# Patient Record
Sex: Male | Born: 1965 | Race: White | Hispanic: No | Marital: Married
Health system: Southern US, Community
[De-identification: ages and names within clinical notes are randomized; demographics above are authoritative.]

## PROBLEM LIST (undated history)

## (undated) DIAGNOSIS — E785 Hyperlipidemia, unspecified: Secondary | ICD-10-CM

## (undated) DIAGNOSIS — N183 Chronic kidney disease, stage 3 unspecified: Secondary | ICD-10-CM

## (undated) DIAGNOSIS — I714 Abdominal aortic aneurysm, without rupture: Secondary | ICD-10-CM

## (undated) DIAGNOSIS — E119 Type 2 diabetes mellitus without complications: Secondary | ICD-10-CM

## (undated) DIAGNOSIS — E669 Obesity, unspecified: Secondary | ICD-10-CM

## (undated) DIAGNOSIS — I10 Essential (primary) hypertension: Secondary | ICD-10-CM

## (undated) HISTORY — DX: Chronic kidney disease, stage 3 (moderate): N18.3

## (undated) HISTORY — DX: Chronic kidney disease, stage 3 unspecified: N18.30

## (undated) HISTORY — DX: Essential (primary) hypertension: I10

## (undated) HISTORY — DX: Obesity, unspecified: E66.9

## (undated) HISTORY — DX: Abdominal aortic aneurysm, without rupture: I71.4

## (undated) HISTORY — DX: Hyperlipidemia, unspecified: E78.5

## (undated) HISTORY — PX: OTHER SURGICAL HISTORY: SHX169

## (undated) HISTORY — DX: Type 2 diabetes mellitus without complications: E11.9

---

## 2005-04-06 ENCOUNTER — Ambulatory Visit: Payer: Self-pay | Admitting: Oncology

## 2005-06-24 ENCOUNTER — Ambulatory Visit: Payer: Self-pay | Admitting: Oncology

## 2005-06-25 LAB — COMPREHENSIVE METABOLIC PANEL
ALT: 65 U/L — ABNORMAL HIGH (ref 0–40)
AST: 54 U/L — ABNORMAL HIGH (ref 0–37)
Albumin: 4.6 g/dL (ref 3.5–5.2)
Calcium: 9.1 mg/dL (ref 8.4–10.5)
Chloride: 101 mEq/L (ref 96–112)
Potassium: 4.5 mEq/L (ref 3.5–5.3)
Sodium: 135 mEq/L (ref 135–145)

## 2005-06-25 LAB — CBC WITH DIFFERENTIAL/PLATELET
BASO%: 0.2 % (ref 0.0–2.0)
EOS%: 4 % (ref 0.0–7.0)
HGB: 17.5 g/dL — ABNORMAL HIGH (ref 13.0–17.1)
MCH: 31.9 pg (ref 28.0–33.4)
MCHC: 35.1 g/dL (ref 32.0–35.9)
RDW: 13.5 % (ref 11.2–14.6)
lymph#: 1.1 10*3/uL (ref 0.9–3.3)

## 2005-10-05 ENCOUNTER — Ambulatory Visit: Payer: Self-pay | Admitting: Oncology

## 2005-10-08 LAB — CBC WITH DIFFERENTIAL/PLATELET
BASO%: 0.6 % (ref 0.0–2.0)
Basophils Absolute: 0 10*3/uL (ref 0.0–0.1)
HCT: 40.4 % (ref 38.7–49.9)
HGB: 14.3 g/dL (ref 13.0–17.1)
MONO#: 0.5 10*3/uL (ref 0.1–0.9)
NEUT%: 55.4 % (ref 40.0–75.0)
RDW: 12.9 % (ref 11.2–14.6)
WBC: 5.8 10*3/uL (ref 4.0–10.0)
lymph#: 2 10*3/uL (ref 0.9–3.3)

## 2005-10-11 LAB — COMPREHENSIVE METABOLIC PANEL
ALT: 58 U/L — ABNORMAL HIGH (ref 0–40)
BUN: 11 mg/dL (ref 6–23)
CO2: 24 mEq/L (ref 19–32)
Chloride: 99 mEq/L (ref 96–112)
Creatinine, Ser: 1.04 mg/dL (ref 0.40–1.50)
Potassium: 4 mEq/L (ref 3.5–5.3)

## 2005-10-11 LAB — ERYTHROPOIETIN: Erythropoietin: 6.4 m[IU]/mL (ref 2.6–34.0)

## 2005-10-11 LAB — IRON AND TIBC
%SAT: 38 % (ref 20–55)
Iron: 100 ug/dL (ref 42–165)
UIBC: 166 ug/dL

## 2006-05-06 ENCOUNTER — Ambulatory Visit: Payer: Self-pay | Admitting: Oncology

## 2006-05-20 LAB — CBC WITH DIFFERENTIAL/PLATELET
BASO%: 0.3 % (ref 0.0–2.0)
EOS%: 1.4 % (ref 0.0–7.0)
HCT: 42 % (ref 38.7–49.9)
MCH: 32 pg (ref 28.0–33.4)
MCHC: 35.5 g/dL (ref 32.0–35.9)
NEUT%: 58.2 % (ref 40.0–75.0)
RDW: 12.7 % (ref 11.2–14.6)
lymph#: 2.1 10*3/uL (ref 0.9–3.3)

## 2006-05-20 LAB — IRON AND TIBC: TIBC: 284 ug/dL (ref 215–435)

## 2006-09-01 ENCOUNTER — Ambulatory Visit: Payer: Self-pay | Admitting: Vascular Surgery

## 2006-09-09 ENCOUNTER — Encounter: Admission: RE | Admit: 2006-09-09 | Discharge: 2006-09-09 | Payer: Self-pay | Admitting: Vascular Surgery

## 2006-09-09 ENCOUNTER — Ambulatory Visit: Payer: Self-pay | Admitting: Vascular Surgery

## 2006-09-16 ENCOUNTER — Ambulatory Visit: Payer: Self-pay | Admitting: Vascular Surgery

## 2006-09-26 ENCOUNTER — Ambulatory Visit: Payer: Self-pay | Admitting: Vascular Surgery

## 2006-09-26 ENCOUNTER — Encounter: Payer: Self-pay | Admitting: Vascular Surgery

## 2006-09-26 ENCOUNTER — Inpatient Hospital Stay (HOSPITAL_COMMUNITY): Admission: RE | Admit: 2006-09-26 | Discharge: 2006-10-17 | Payer: Self-pay | Admitting: Vascular Surgery

## 2006-09-26 ENCOUNTER — Ambulatory Visit: Payer: Self-pay | Admitting: Critical Care Medicine

## 2006-09-27 ENCOUNTER — Encounter: Payer: Self-pay | Admitting: Internal Medicine

## 2006-09-30 ENCOUNTER — Ambulatory Visit: Payer: Self-pay | Admitting: Internal Medicine

## 2006-11-02 ENCOUNTER — Ambulatory Visit: Payer: Self-pay | Admitting: Vascular Surgery

## 2006-11-23 ENCOUNTER — Ambulatory Visit: Payer: Self-pay | Admitting: Oncology

## 2007-05-10 ENCOUNTER — Ambulatory Visit: Payer: Self-pay | Admitting: Vascular Surgery

## 2007-07-05 ENCOUNTER — Encounter: Admission: RE | Admit: 2007-07-05 | Discharge: 2007-07-05 | Payer: Self-pay | Admitting: Sports Medicine

## 2009-03-07 ENCOUNTER — Emergency Department (HOSPITAL_COMMUNITY): Admission: EM | Admit: 2009-03-07 | Discharge: 2009-03-07 | Payer: Self-pay | Admitting: Emergency Medicine

## 2010-02-27 ENCOUNTER — Encounter
Admission: RE | Admit: 2010-02-27 | Discharge: 2010-02-27 | Payer: Self-pay | Source: Home / Self Care | Attending: Orthopedic Surgery | Admitting: Orthopedic Surgery

## 2010-03-02 ENCOUNTER — Encounter
Admission: RE | Admit: 2010-03-02 | Discharge: 2010-03-02 | Payer: Self-pay | Source: Home / Self Care | Attending: Orthopedic Surgery | Admitting: Orthopedic Surgery

## 2010-04-12 ENCOUNTER — Encounter: Payer: Self-pay | Admitting: Orthopedic Surgery

## 2010-06-17 ENCOUNTER — Emergency Department (HOSPITAL_COMMUNITY)
Admission: EM | Admit: 2010-06-17 | Discharge: 2010-06-17 | Disposition: A | Discharge status: Home / Self Care | Payer: Worker's Compensation | Attending: Emergency Medicine | Admitting: Emergency Medicine

## 2010-06-17 ENCOUNTER — Emergency Department (HOSPITAL_COMMUNITY): Payer: Worker's Compensation

## 2010-06-17 DIAGNOSIS — IMO0002 Reserved for concepts with insufficient information to code with codable children: Secondary | ICD-10-CM | POA: Insufficient documentation

## 2010-06-17 DIAGNOSIS — M79609 Pain in unspecified limb: Secondary | ICD-10-CM | POA: Insufficient documentation

## 2010-06-17 DIAGNOSIS — S6990XA Unspecified injury of unspecified wrist, hand and finger(s), initial encounter: Secondary | ICD-10-CM | POA: Insufficient documentation

## 2010-06-17 DIAGNOSIS — W298XXA Contact with other powered powered hand tools and household machinery, initial encounter: Secondary | ICD-10-CM | POA: Insufficient documentation

## 2010-08-04 NOTE — Assessment & Plan Note (Signed)
OFFICE VISIT   CHAYCE, ROBBINS  DOB:  04-29-1965                                       09/16/2006  [id number]#:[id number]   Mr. Seabrook returns for further followup today.  He is scheduled to  undergo a stress test on June 30th and September 20, 2006.  He has had no  abdominal or back pain.  I had a lengthy discussion again with him today  about risks, benefits, possible complications and procedure details for  aneurysm repair.  I also had further discussions with him regarding his  religious status.  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  I told him that we will use a Cell Saver and he is  okay with this.  We will also try to avoid transfusing him if possible.  Other risks including renal failure, infection, bleeding were explained  to the patient.  I also discussed with him today the possibility of  retrograde ejaculation.   EXAM:  Today, blood pressure is 134/92, heart rate was 89 and regular.  ABDOMEN:  Soft, nontender, nondistended.   We have tentatively scheduled his aneurysm repair for September 29, 2006.  This is pending the results of his stress test on June 30 and September 20, 2006.   [name], MD  Electronically Signed   CEF/MEDQ  D:  09/18/2006  T:  09/19/2006  Job:  [id number]   cc:   Vikki Ports, M.D.
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
## 2010-08-04 NOTE — Op Note (Signed)
[name], [name]           ACCOUNT NO.:  [id number]   MEDICAL RECORD NO.:  [id number]          PATIENT TYPE:  INP   LOCATION:  2305                         FACILITY:  [facility]   PHYSICIAN:  [name], MD  DATE OF BIRTH:  [birth year]   DATE OF PROCEDURE:  09/26/2006  DATE OF DISCHARGE:                               OPERATIVE REPORT   PROCEDURE:  1. Repair of juxtarenal abdominal aortic aneurysm.  2. Reimplantation of inferior mesenteric artery.   PREOPERATIVE DIAGNOSIS:  Juxtarenal abdominal aortic aneurysm.   POSTOPERATIVE DIAGNOSIS:  Juxtarenal abdominal aortic aneurysm.   ANESTHESIA:  General.   ASSISTANT:  Jerold Coombe, P.A.-C, and Louie Casa, R.N.F.A.   INDICATIONS:  The patient is a 45 year old male with a juxtarenal 7.6-cm  abdominal aortic aneurysm.  This has been asymptomatic.  He presents for  elective repair.   OPERATIVE FINDINGS:  1. An 18-mm Dacron rifampin-soaked graft.  2. Reimplantation of inferior mesenteric artery.  3. Suprarenal clamp.  4. Intraoperative cultures.   OPERATIVE DETAIL:  After obtaining informed consent, the patient was  taken to the operating room.  The patient was placed in the supine  position on the operating table.  After induction of general anesthesia  and endotracheal intubation, a Foley catheter was placed.  Nasogastric  tube was also placed.  Next, the patient's entire abdomen and upper  thighs were prepped and draped in the usual sterile fashion.  Midline  laparotomy incision was made from the xiphoid extending down to the  pubis.  Incision was carried down through subcutaneous tissues, and the  fascia was incised along the line of the linea alba.  Peritoneum was  opened for the full length of the incision.  Small bowel was then  reflected to the right and the transverse colon and omentum reflected  superiorly, and  an Omni retractor was brought in the operative field  for assistance in retraction.  The patient's  retroperitoneum was  evaluated, and there was found to be a large abdominal aortic aneurysm.  Dissection was carried through the retroperitoneal tissues, and the  aneurysm was exposed.  Dissection was carried up to level of the left  renal vein.  The aneurysm extended up to the renal arteries by CT scan.  The left renal vein was dissected free circumferentially.  It was  evident that exposure of the upper portion of the neck of the aneurysm  was not going to be possible without division of the left renal vein.  Therefore, the left gonadal and left adrenal veins were left intact and  the left renal vein divided between clamps and then oversewn on both  sides with a running 5-0 Prolene suture.  Clamps were removed, and this  was found to be hemostatic.  Next, the left and right renal arteries  were identified.  These were dissected free circumferentially.  Vessel  loops were doubly placed around these.  The neck just above the renal  arteries was freed for clamping.  The aneurysm extended up to the base  of the renal arteries with approximately 1-2 cm of normal aorta below  the renal  arteries.  Next, the right and left common iliac arteries were  dissected free along their anterior surface.  These were prepared for  clamping.  There was a large amount of lymphatic fluid in the upper  portion of the abdomen.  As many lymphatics as possible were clipped or  ligated with silk sutures.  Some intraoperative cultures were sent of  this lymphatic fluid.  The aneurysm was large in character but did not  have the appearance of the mycotic aneurysm.  There was minimal  inflammation around this.  Next, the patient was given 25 grams of  mannitol and 7000 units of intravenous heparin.  The left and right  common iliac arteries were then clamped with angled coarct clamps.  The  aorta was then clamped just above the renal arteries.  The renal  arteries were controlled with vessel loops.  The aneurysm was  then  entered.  There was a large amount of bleeding from several lumbar  arteries.  These were oversewn with 2-0 silk figure-of-eight sutures.  The IMA was back bleeding some, and this was controlled with a vessel  loop.  Next, a suitable neck was obtained just below the renal arteries.  This was fashioned with Metzenbaum scissors.  An 18-mm Dacron graft was  then brought up into the operative field.  This was soaked in rifampin  per usual protocol.  After the rifampin-soaked graft had been prepared,  it was then sewn end-to-end to the aorta using a running 3-0 Prolene  suture.  Rifampin-soaked graft was used due to some murkiness in the  lymphatic fluid.  Anastomosis was then tested and found to be  hemostatic.  Clamp was then moved down to the main body of the graft  after the renal arteries had been back bled thoroughly and flushed.  Flow was then restored to the left and right renal arteries.  The graft  was then cut to length and sewn end-to-end to the aortic bifurcation  using a running 3-0 Prolene suture.  Just prior to completion of the  anastomosis, everything was back bled, forward bled and thoroughly  flushed.  Anastomosis was secured.  Clamp was first released to the  right leg.  There was a significant pressure drop which then quickly  recovered.  Flow was then restored to the left leg.  Similar pressure  drop was obtained, and this quickly recovered.  It should be noted that  the distal end of the graft was sewn to the aortic bifurcation into the  orifices of the iliac arteries, and the proximal end was sewn to the  juxtarenal aorta just below the renal arteries with several of the  sutures just adjacent to the left renal artery.  Doppler was used to  inspect the renal arteries.  There was good flow through both of these.  The inferior mesenteric artery was then inspected.  It was found to have  fairly sluggish flow.  It was decided to reimplant this.  A small cuff  of  aorta was dissected free circumferentially at the origin of the IMA  to use as a Carrell patch.  A side-biting Satinsky clamp was then placed  on the infrarenal aorta approximately 3 cm above the aortic bifurcation.  An 11 blade and a punch were used to make a small hole in the side of  the graft.  The cuff of aortic tissue with the inferior mesenteric  artery pedicle was then sewn to the graft using a running 6-0  Prolene  suture.  Prior to completion, the anastomosis was forward bled, back  bled and thoroughly flushed.  Anastomosis was secured.  Clamps were  released.  There was some flow in the inferior mesenteric artery that  was monophasic in character.  At this point, the bowel was inspected.  The small bowel was pink and viable.  The sigmoid and descending colon  was pink and viable as well.  Next, hemostasis was obtained.  The  patient had been given an additional 3000 units of heparin during the  case.  Heparin was reversed with 50 mg of protamine.  After hemostasis  was obtained, the retroperitoneal tissue and aneurysm sac were closed as  a common layer using a running 3-0 Prolene suture.  The fascia was then  closed with a #1 running PDS suture.  Skin was closed staples.  The  patient had palpable femoral pulses, and the feet were pink and warm at  the end of the case.  The patient tolerated the procedure well, and  there wee no complications.  Instrument, sponge and needle counts were  correct at the end of the case.  The patient had good urine output at  the end of the case.      [name], MD  Electronically Signed     CEF/MEDQ  D:  09/26/2006  T:  09/27/2006  Job:  [id number]

## 2010-08-04 NOTE — Assessment & Plan Note (Signed)
OFFICE VISIT   [name], [name]  DOB:  May 12, 1965                                       11/02/2006  ZOXWR#:[id number]   [name] returns for followup today after repair of his  juxtarenal abdominal aortic aneurysm on 07/07.  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  Since being discharged from the hospital, he has done  well.  He was started on insulin for his diabetes in the hospital.  His  renal function recovered significantly.  His most recent serum  creatinine on 08/05 was 2.09.  This is significantly improved from when  he was in the hospital.  He apparently recently saw the doctors at  [facility] and has followup arranged for the future.  His glucose  has been running between 110 and 120 at home.   PHYSICAL EXAM:  VITAL SIGNS:  Blood pressure is 126/78, heart rate is 91  and regular.  ABDOMEN:  Soft, nontender with a well-healed abdominal incision.  No  evidence of hernia.  EXTREMITIES:  He has 2+ femoral, 2+ posterior tibial pulses bilaterally.  He has a 1+ dorsalis pedis pulse in the left, absent dorsalis pedis  pulse on the right.  He has no significant edema and no other symptoms  of uremia.   He wishes to return to work.   Overall, [name] is doing well.  We will have him follow up with  [place] in 6 months' time for a renal duplex scan and a repeat basic  metabolic panel in 6 months' time.   [name], MD  Electronically Signed   CEF/MEDQ  D:  11/02/2006  T:  11/04/2006  Job:  [id number]   cc:   Fayrene Fearing L. Deterding, M.D.

## 2010-08-04 NOTE — Discharge Summary (Signed)
[name], AHART           ACCOUNT NO.:  [id number]   MEDICAL RECORD NO.:  [id number]          PATIENT TYPE:  INP   LOCATION:  2037                         FACILITY:  [facility]   PHYSICIAN:  [name], MD  DATE OF BIRTH:  1965-10-13   DATE OF ADMISSION:  09/26/2006  DATE OF DISCHARGE:  10/17/2006                               DISCHARGE SUMMARY   REASON FOR ADMISSION:  Abdominal aortic aneurysm.   SECONDARY DIAGNOSES:  1. Delirium.  2. GI bleeding.  3. Renal failure.  4. Ventilator-dependent respiratory failure.  5. Pancreatitis.   OPERATIONS:  1. Abdominal aortic aneurysm repair on September 26, 2006, Dr. Fabienne Bruns.  2. Colonoscopy by Dr. Leone Payor on September 27, 2006.  3. Closure of abdominal wound on September 29, 2006, Dr. Fabienne Bruns.  4. Insertion of Diatek catheter, Dr. [name], on September 30, 2006.   CONSULTATIONS:  1. [name], M.D., [facility], gastroenterology, for rectal bleeding.  2. Angelia Mould. [name], M.D., [facility], for possible      ischemic colitis.  3. [name], M.D., [facility], for renal      failure.  4. Pulmonary critical care service for ventilator management.   HOSPITAL COURSE:  The patient is a 45 year old male who had a juxtarenal  abdominal aortic aneurysm.  He was admitted on September 26, 2006 and  underwent repair of his aneurysm at that time.  He required 50 minutes  of suprarenal clamp time.  He was taken to the intensive care unit  postoperatively.  On postoperative day #1, he experienced several  episodes of bloody stools.  At that time. a GI consul was obtained.  Dr.  Leone Payor performed a colonoscopy on that day which showed some evidence  of inflammatory changes versus ischemic changes in the rectal mucosa.  He was kept n.p.o.  He was also seen by Dr. [name] from the general  surgery service who recommended conservative management and n.p.o. along  with broad-spectrum  antibiotics.  These were also started.  Subsequently, over the next 48 hours, the patient became increasingly  confused and agitated.  He required restraint for this.  He also  required some sedation with Ativan and Haldol.  The patient had several  episodes where he was straining down trying to force himself to have a  bowel movement and began to have leaking of fluid from his abdominal  incision.  He also developed renal failure thought to be secondary to  acute tubular necrosis from his suprarenal clamping.  He was taken back  to the operating room on September 29, 2006 for exploration of his abdominal  wound to make sure the fluid leakage was not a fascial dehiscence.  No  dehiscence was found.  The abdomen was reclosed.  His renal failure  continued to worsen, and a Diatek dialysis catheter was placed by Dr.  [name] on September 27, 2006.  He subsequently underwent  hemodialysis after consultation with Dr. [name] from  [facility].  He required dialysis on October 01, 2006;  October 05, 2006, and October 07, 2006.  Subsequently, his kidney slowly improved,  and at the time of discharge his creatinine had drifted back down to 3  from a peak of around 11-12.  It was thought that he should have  complete recovery of renal function.  His renal failure was always  nonoliguric in character.   In addition to his renal failure, the patient also developed some  pancreatitis, thought to be due to some traction around the time of the  suprarenal clamping.  He was maintained initially on TPN, and after  trending down of his pancreatic enzymes he was placed on tube feeds and  tolerated this well.  Tube feeds were eventually discontinued after the  patient had been taken off the ventilator, and he was advanced to a  renal diet at the time of discharge.  The patient had ventilator  dependence for a brief time.  He was reintubated on postoperative day 2,  and then remained on  the ventilator for approximately 7 days.  After  hemodialysis to remove fluid on several occasions, we were able to  successfully wean and extubate him.  He had no further pulmonary  problems.  He was followed by the critical care service for his  ventilator issues.  The patient's delirium resolved after his renal  failure had improved and he had been taken off the ventilator.  He  required no further sedation after his second extubation.   The patient had no further episodes of GI bleeding, delirium,  pancreatitis, or renal failure subsequently in his hospital course.  He  was transferred to unit 2000 after discharge from the ICU on  postoperative day 17.  His staples were then removed on postoperative  day 18.  The renal service signed off on postoperative day 21, with his  kidney function continuing to improve.  The patient was discharged to  home on postoperative day 21.  The the patient had been admitted on  Glucophage, but was requiring insulin at the time of his discharge.  The  patient was improved and tolerating a renal diet at the time of  discharge.   MEDICATIONS ON DISCHARGE:  1. Metoprolol ER 25 mg once a day.  2. Pravastatin 20 mg once a day.  3. Lantus insulin 30 units in the morning.  The the patient had been      on Lantus twice a day prior to discharge, but the evening dose was      stopped secondary to some hypoglycemia.  4. Tylox 1-2 q.4-6 h. p.r.n. pain.   FOLLOWUP:  The patient will follow up with Dr. [name] in 2-3 weeks, and  a serum creatinine will be checked at that time.  If his renal function  has improved, he will no require followup with the renal doctors.   The patient will also need followup with Dr. Leone Payor for rescope of his  colon, and this will be determined after his first postoperative visit  with Dr. [name].      [name], MD  Electronically Signed     CEF/MEDQ  D:  10/17/2006  T:  10/18/2006  Job:  [id number]

## 2010-08-04 NOTE — Op Note (Signed)
[name], KLINGENSMITH           ACCOUNT NO.:  [id number]   MEDICAL RECORD NO.:  [id number]          PATIENT TYPE:  INP   LOCATION:  2305                         FACILITY:  [facility]   PHYSICIAN:  [name], MD  DATE OF BIRTH:  02-May-1965   DATE OF PROCEDURE:  09/29/2006  DATE OF DISCHARGE:                               OPERATIVE REPORT   PROCEDURE:  Exploration of abdominal wound and closure of abdominal  wound.   PREOPERATIVE DIAGNOSIS:  Abdominal fascial dehiscence.   POSTOPERATIVE DIAGNOSIS:  No obvious evidence of dehiscence.   INDICATIONS:  The patient is a 45 year old male who is approximately  four days status post abdominal aortic aneurysm repair.  He was  straining down heavily the previous evening and began to have a large  amount of fluid leaking from his abdominal wound.  He was taken to the  operating room for exploration of wound to make sure that he had no  evidence of dehiscence.   OPERATIVE FINDINGS:  Intact fascia with no dehiscence.   OPERATIVE DETAIL:  After obtaining informed consent, the patient was  taken to the operating room.  The patient placed in supine position on  the operating table.  After induction of general anesthesia the  patient's abdomen was prepped and draped usual sterile fashion.  All the  midline laparotomy staples removed.  The wound was thoroughly irrigated  normal saline solution.  The fascia was inspected and the previous  fascial closure was completely intact and there was no evidence of  dehiscence.  This point the wound was again thoroughly irrigated with  normal saline solution.  The skin was then reapproximated with staples.  The patient tolerated procedure well and there were no complications.  Instrument, sponge and needle counts were correct at the end of the  case.  The patient taken back to the intensive care unit in stable  condition.      [name], MD  Electronically Signed     CEF/MEDQ  D:   10/06/2006  T:  10/07/2006  Job:  [id number]
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
## 2010-08-04 NOTE — Procedures (Signed)
RENAL ARTERY DUPLEX EVALUATION   INDICATION:  Follow-up evaluation of renal arteries, status post  abdominal aortic aneurysm repair and reimplementation of mesenteric  artery. History of abnormal renal function tests.   HISTORY:  Diabetes:  Borderline.  Cardiac:  No.  Hypertension:  Yes.  Smoking:  Quit three years ago.   RENAL ARTERY DUPLEX FINDINGS:  Aorta-Proximal:  51 cm/s  Aorta-Mid:  58 cm/s  Aorta-Distal:  58 cm/s  Celiac Artery Origin:  SMA Origin:                                    RIGHT               LEFT  Renal Artery Origin:             90/36 cm/s          75/21 cm/s  Renal Artery Proximal:           100/36 cm/s         61/26 cm/s  Renal Artery Mid:                68/29 cm/s          59/24 cm/s  Renal Artery Distal:             67/22 cm/s          61/24 cm/s  Hilar Acceleration Time (AT):    0.04 m/s2           0.05 m/s2  Renal-Aortic Ratio (RAR):        1.7                 1.29  Kidney Size:                     12.6 cm X 6.5 cm    12.3 cm X 4.8 cm  End Diastolic Ratio (EDR):       0.42 to 0.43        0.38 to 0.38  Resistive Index (RI):            0.64                0.57   IMPRESSION:  1. Kidneys are normal with respect to size and shape bilaterally.  2. No evidence of renal artery stenosis bilaterally.  3. End-diastolic ratio suggests no evidence of parenchymal disease      bilaterally.   ___________________________________________  Janetta Hora Fields, MD   MC/MEDQ  D:  05/10/2007  T:  05/11/2007  Job:  [id number]

## 2010-08-04 NOTE — Op Note (Signed)
NAMEISSAIH, KAUS           ACCOUNT NO.:  [id number]   MEDICAL RECORD NO.:  [id number]          PATIENT TYPE:  INP   LOCATION:  2305                         FACILITY:  [facility]   PHYSICIAN:  Di Kindle. Edilia Bo, M.D.DATE OF BIRTH:  [birth year]   DATE OF PROCEDURE:  09/30/2006  DATE OF DISCHARGE:                               OPERATIVE REPORT   PREOPERATIVE DIAGNOSIS:  Renal failure.   POSTOPERATIVE DIAGNOSIS:  Renal failure.   PROCEDURE:  Ultrasound guided placement of a left internal jugular 32 cm  Diatek catheter.   SURGEON:  Di Kindle. Edilia Bo, M.D.   ANESTHESIA:  General.   BLOOD LOSS:  100 mL.   TECHNIQUE:  The patient was taken to the operating room and received a  general anesthetic.  The patient had a triple lumen catheter in the  right subclavian, therefore, I elected a left sided approach.  The  ultrasound scanner was used to mark the left IJ which appeared to be  patent.  The neck and upper chest were prepped and draped in the usual  sterile fashion.  After the skin was infiltrated with 1% lidocaine, the  left IJ was cannulated and a guidewire introduced into the superior vena  cava under fluoroscopic control.  I then passed an Indole catheter over  this wire and exchanged this wire for an Amplatz wire.  The tract over  the Amplatz wire was then dilated and then a dilator and peel away  sheath were advanced over the wire and the dilator removed.  The 32 cm  catheter was threaded over the wire through the peel away sheath down  into the right atrium and the peel away sheath and wire were removed.  The exit site for the catheter was selected and the catheter then  brought through the tunnel, cut to the appropriate length, and the  distal ports were attached.  Both ports withdrew easily, were then  flushed with heparinized saline, and filled with concentrated heparin.  The catheter was secured at its exit site with a 3-0 nylon suture.  The  IJ cannulation  site was closed with a 4-0 subcuticular stitch.  A  sterile dressing was applied.  The patient tolerated the procedure well  and was transferred to the recovery room in satisfactory condition.  All  needle and sponge counts were correct.      Di Kindle. Edilia Bo, M.D.  Electronically Signed     CSD/MEDQ  D:  09/30/2006  T:  10/01/2006  Job:  [id number]

## 2010-10-19 ENCOUNTER — Ambulatory Visit: Payer: Worker's Compensation

## 2010-10-22 ENCOUNTER — Ambulatory Visit: Payer: Worker's Compensation | Admitting: *Deleted

## 2010-11-02 ENCOUNTER — Encounter: Payer: Self-pay | Admitting: *Deleted

## 2010-11-02 ENCOUNTER — Encounter: Payer: 59 | Attending: Nephrology | Admitting: *Deleted

## 2010-11-02 DIAGNOSIS — E785 Hyperlipidemia, unspecified: Secondary | ICD-10-CM | POA: Insufficient documentation

## 2010-11-02 DIAGNOSIS — E119 Type 2 diabetes mellitus without complications: Secondary | ICD-10-CM | POA: Insufficient documentation

## 2010-11-02 DIAGNOSIS — N189 Chronic kidney disease, unspecified: Secondary | ICD-10-CM | POA: Insufficient documentation

## 2010-11-02 NOTE — Patient Instructions (Addendum)
Goals:  There are a lot of goals here.  We will work on them in phases.  For this month, concentrate on counting your carbs and staying within the recommended ranges and meals...   Follow Diabetes Meal Plan as instructed (see yellow card).  Eat 3 meals and 2-3 snacks; or every 3-5 hrs - NO meal skipping.  Limit carbohydrate intake to  45-60 grams/meal and 15 grams/snack.  Add lean proteins with high biological value to all meals/snacks (ex: eggs, poultry, fish, low-fat milk, yogurt, etc.).  Avoid alcoholic beverages as they can cause elevated triglycerides.  Limit sweets, sugar sweetened beverages, and high fat/fried foods.  Aim for less than 2000 mg sodium, <200 mg dietary cholesterol, and increase fiber to 25-30 g.  Monitor glucose levels and follow any dietary restrictions as instructed by your doctor.  Bring glucose log to your next nutrition visit.  Aim for 30 min or more of physical activity daily.

## 2010-11-02 NOTE — Progress Notes (Signed)
Medical Nutrition Therapy:  Appt start time: 1700 end time:  1800.  Assessment:  Primary concerns today: CKD Stage III, Hyperlipidemia, T2DM (new dx).  Pt here for multiple diagnoses, including a new dx of T2DM.  Pt 5 yrs s/p abdominal aneurysm, which caused him to lead a sedentary lifestyle for a while. Was then active and lost weight, only to become sedentary again and experience gain up to 261.7 lbs.  States CKD is from surgery to repair aneurysm and no dietary restrictions from MD (no order received for renal diet by [facility]).  Pt consumes excessive fat, CHO, Na, and cholesterol through meals out daily.  Has made some positive dietary changes noted in food recall. Very little exercise at present.   MEDICATIONS: Simvastatin, Lisinopril   DIETARY INTAKE:  Usual eating pattern includes 2-3 meals and 0 snacks per day.  24-hr recall:  B ( AM): used to do [employer] biscuits, but now only 1x/wk if that; at home omlette, toast (2), ham (usually) or bacon (rare); 2% milk w/ instant coffee or low sugar hot chocolate  Snk ( AM): None  L ( PM): crystal light; [employer] or burgers w/ only 1/2 bun; hotdog w/ 1 pc toast and relish Snk ( PM): None D ( PM): Fried fish - tilapia, snapper, salmon (in olive oil on stove top), 80% of time rice or sometimes mashed potatoes/sweet potatoes Snk ( PM): None  Usual physical activity: Basketball w/ kids (several times/wk)  Estimated needs: 1500-1600 calories 185-200 g carbohydrates 65-70 g protein 50-55 g fat (<12-14g as saturated fat) <2000 mg sodium <200 mg dietary cholesterol 25-30 g fiber  Progress Towards Goal(s):  NEW.   Nutritional Diagnosis:  [place]-2.1 Impaired nutrient utilization related to excessive CHO intake as evidenced by 24 hour recall and MD referral for new diagnosis of T2DM.    Intervention/Goals: There are a lot of goals here.  We will work on them in phases.  For this month, concentrate on counting your carbs and staying within the recommended  ranges and meals...   Follow Diabetes Meal Plan as instructed (see yellow card).  Eat 3 meals and 2-3 snacks; or every 3-5 hrs - NO meal skipping.  Limit carbohydrate intake to 45-60 grams/meal and 15 grams/snack  Add lean proteins with high biological value to all meals/snacks (ex: eggs, poultry, fish, low-fat milk, yogurt, etc.).  Avoid alcoholic beverages as they can cause elevated triglycerides.  Limit sweets, sugar sweetened beverages, and high fat/fried foods.  Aim for less than 2000 mg sodium, <200 mg dietary cholesterol, and increase fiber to 25-30 g.  Monitor glucose levels and follow any dietary restrictions as instructed by your doctor.  Bring glucose log to your next nutrition visit.  Aim for 30 min or more of physical activity daily.   Monitoring/Evaluation:  Dietary intake, exercise, A1c, blood glucose, GFR, and body weight in 6 week(s).

## 2010-11-04 ENCOUNTER — Encounter: Payer: Self-pay | Admitting: *Deleted

## 2011-01-04 LAB — DIFFERENTIAL
Basophils Absolute: 0.1
Basophils Relative: 1
Basophils Relative: 1
Eosinophils Relative: 2
Lymphocytes Relative: 15
Lymphs Abs: 1.9
Monocytes Absolute: 1 — ABNORMAL HIGH
Monocytes Absolute: 1 — ABNORMAL HIGH
Monocytes Relative: 11
Monocytes Relative: 8
Neutro Abs: 6

## 2011-01-04 LAB — CLOSTRIDIUM DIFFICILE EIA
C difficile Toxins A+B, EIA: NEGATIVE
C difficile Toxins A+B, EIA: NEGATIVE

## 2011-01-04 LAB — PROTIME-INR
INR: 1.1
Prothrombin Time: 14.7

## 2011-01-04 LAB — POCT I-STAT 3, ART BLOOD GAS (G3+)
Acid-Base Excess: 1
Acid-base deficit: 5 — ABNORMAL HIGH
Bicarbonate: 21.2
Bicarbonate: 24.9 — ABNORMAL HIGH
Bicarbonate: 27 — ABNORMAL HIGH
Bicarbonate: 27 — ABNORMAL HIGH
O2 Saturation: 97
O2 Saturation: 99
Operator id: 273391
Operator id: 280971
Operator id: 283371
Patient temperature: 98.6
Patient temperature: 98.8
Patient temperature: 98.9
Patient temperature: 98.9
TCO2: 22
TCO2: 22
TCO2: 25
TCO2: 26
TCO2: 28
pCO2 arterial: 32.7 — ABNORMAL LOW
pCO2 arterial: 42.8
pH, Arterial: 7.291 — ABNORMAL LOW
pH, Arterial: 7.301 — ABNORMAL LOW
pH, Arterial: 7.325 — ABNORMAL LOW
pH, Arterial: 7.376
pH, Arterial: 7.409
pO2, Arterial: 110 — ABNORMAL HIGH
pO2, Arterial: 155 — ABNORMAL HIGH
pO2, Arterial: 76 — ABNORMAL LOW
pO2, Arterial: 88

## 2011-01-04 LAB — TRIGLYCERIDES
Triglycerides: 173 — ABNORMAL HIGH
Triglycerides: 193 — ABNORMAL HIGH
Triglycerides: 248 — ABNORMAL HIGH

## 2011-01-04 LAB — BASIC METABOLIC PANEL
BUN: 104 — ABNORMAL HIGH
BUN: 40 — ABNORMAL HIGH
BUN: 68 — ABNORMAL HIGH
BUN: 82 — ABNORMAL HIGH
CO2: 21
CO2: 22
CO2: 25
CO2: 27
Calcium: 8 — ABNORMAL LOW
Calcium: 8.2 — ABNORMAL LOW
Calcium: 8.3 — ABNORMAL LOW
Calcium: 9
Chloride: 101
Chloride: 104
Chloride: 107
Chloride: 108
Creatinine, Ser: 5.78 — ABNORMAL HIGH
Creatinine, Ser: 7.21 — ABNORMAL HIGH
Creatinine, Ser: 8.95 — ABNORMAL HIGH
Creatinine, Ser: 9.9 — ABNORMAL HIGH
GFR calc Af Amer: 10 — ABNORMAL LOW
GFR calc Af Amer: 12 — ABNORMAL LOW
GFR calc Af Amer: 8 — ABNORMAL LOW
GFR calc non Af Amer: 18 — ABNORMAL LOW
GFR calc non Af Amer: 8 — ABNORMAL LOW
Glucose, Bld: 100 — ABNORMAL HIGH
Glucose, Bld: 129 — ABNORMAL HIGH
Glucose, Bld: 150 — ABNORMAL HIGH
Glucose, Bld: 180 — ABNORMAL HIGH
Potassium: 3.6
Potassium: 3.8
Potassium: 4
Sodium: 137
Sodium: 139

## 2011-01-04 LAB — CBC
HCT: 26.6 — ABNORMAL LOW
HCT: 28.8 — ABNORMAL LOW
HCT: 29 — ABNORMAL LOW
HCT: 35.2 — ABNORMAL LOW
Hemoglobin: 10.3 — ABNORMAL LOW
Hemoglobin: 10.5 — ABNORMAL LOW
Hemoglobin: 11.3 — ABNORMAL LOW
Hemoglobin: 12.1 — ABNORMAL LOW
Hemoglobin: 9.2 — ABNORMAL LOW
MCHC: 33.9
MCHC: 34.2
MCHC: 34.6
MCHC: 34.7
MCHC: 34.8
MCHC: 34.9
MCHC: 35.1
MCHC: 35.2
MCV: 88.9
MCV: 89.7
MCV: 90.1
MCV: 90.5
MCV: 90.8
MCV: 91.2
Platelets: 289
Platelets: 308
Platelets: 309
Platelets: 336
Platelets: 390
Platelets: 392
Platelets: 462 — ABNORMAL HIGH
RBC: 2.92 — ABNORMAL LOW
RBC: 3.11 — ABNORMAL LOW
RBC: 3.21 — ABNORMAL LOW
RBC: 3.32 — ABNORMAL LOW
RBC: 3.8 — ABNORMAL LOW
RDW: 13.3
RDW: 13.4
RDW: 13.5
RDW: 13.6
RDW: 13.7
RDW: 13.7
RDW: 13.8
RDW: 13.9
RDW: 14.1 — ABNORMAL HIGH
WBC: 11.8 — ABNORMAL HIGH
WBC: 12 — ABNORMAL HIGH
WBC: 13.3 — ABNORMAL HIGH

## 2011-01-04 LAB — URINALYSIS, ROUTINE W REFLEX MICROSCOPIC
Bilirubin Urine: NEGATIVE
Ketones, ur: NEGATIVE
Nitrite: NEGATIVE
Protein, ur: 30 — AB
Urobilinogen, UA: 0.2

## 2011-01-04 LAB — COMPREHENSIVE METABOLIC PANEL
ALT: 14
ALT: 19
ALT: 29
AST: 20
AST: 29
AST: 34
Albumin: 2.6 — ABNORMAL LOW
Albumin: 2.8 — ABNORMAL LOW
Albumin: 2.8 — ABNORMAL LOW
Alkaline Phosphatase: 106
Alkaline Phosphatase: 111
Alkaline Phosphatase: 111
Alkaline Phosphatase: 53
BUN: 61 — ABNORMAL HIGH
CO2: 26
Calcium: 8.4
Calcium: 9.4
Chloride: 102
GFR calc Af Amer: 12 — ABNORMAL LOW
GFR calc Af Amer: 8 — ABNORMAL LOW
GFR calc Af Amer: 9 — ABNORMAL LOW
GFR calc non Af Amer: 7 — ABNORMAL LOW
Glucose, Bld: 84
Potassium: 3.6
Potassium: 4.1
Potassium: 4.3
Potassium: 4.4
Sodium: 135
Sodium: 138
Sodium: 140
Total Bilirubin: 0.5
Total Protein: 7.5
Total Protein: 7.7
Total Protein: 8.3

## 2011-01-04 LAB — RENAL FUNCTION PANEL
Albumin: 2.6 — ABNORMAL LOW
Albumin: 2.7 — ABNORMAL LOW
Albumin: 2.8 — ABNORMAL LOW
BUN: 87 — ABNORMAL HIGH
BUN: 97 — ABNORMAL HIGH
CO2: 23
Calcium: 8.6
Calcium: 9.1
Chloride: 99
Creatinine, Ser: 4.32 — ABNORMAL HIGH
Creatinine, Ser: 6.44 — ABNORMAL HIGH
GFR calc Af Amer: 12 — ABNORMAL LOW
GFR calc Af Amer: 16 — ABNORMAL LOW
Glucose, Bld: 68 — ABNORMAL LOW
Phosphorus: 7.4 — ABNORMAL HIGH
Phosphorus: 8.3 — ABNORMAL HIGH
Potassium: 4.1
Sodium: 130 — ABNORMAL LOW

## 2011-01-04 LAB — APTT: aPTT: 49 — ABNORMAL HIGH

## 2011-01-04 LAB — PHOSPHORUS
Phosphorus: 3.6
Phosphorus: 5.2 — ABNORMAL HIGH

## 2011-01-04 LAB — CATH TIP CULTURE
Culture: NO GROWTH
Culture: NO GROWTH

## 2011-01-04 LAB — CULTURE, BLOOD (ROUTINE X 2)
Culture: NO GROWTH
Culture: NO GROWTH

## 2011-01-04 LAB — CROSSMATCH

## 2011-01-04 LAB — PTH, INTACT AND CALCIUM: PTH: 61.1

## 2011-01-04 LAB — CHOLESTEROL, TOTAL: Cholesterol: 111

## 2011-01-04 LAB — LIPASE, BLOOD
Lipase: 1475 — ABNORMAL HIGH
Lipase: 946 — ABNORMAL HIGH

## 2011-01-04 LAB — VANCOMYCIN, RANDOM: Vancomycin Rm: 12.6

## 2011-01-04 LAB — URINE CULTURE: Culture: NO GROWTH

## 2011-01-04 LAB — IRON AND TIBC
Iron: 63
UIBC: 126

## 2011-01-05 LAB — POCT I-STAT 7, (LYTES, BLD GAS, ICA,H+H)
O2 Saturation: 98
Potassium: 5.1
TCO2: 23
pCO2 arterial: 36.9

## 2011-01-05 LAB — CROSSMATCH

## 2011-01-05 LAB — BASIC METABOLIC PANEL
BUN: 13
BUN: 41 — ABNORMAL HIGH
BUN: 47 — ABNORMAL HIGH
CO2: 18 — ABNORMAL LOW
CO2: 20
CO2: 20
CO2: 20
Calcium: 7.2 — ABNORMAL LOW
Calcium: 7.2 — ABNORMAL LOW
Calcium: 7.2 — ABNORMAL LOW
Calcium: 7.4 — ABNORMAL LOW
Chloride: 110
Chloride: 111
Chloride: 111
Chloride: 115 — ABNORMAL HIGH
Creatinine, Ser: 3.01 — ABNORMAL HIGH
Creatinine, Ser: 4.78 — ABNORMAL HIGH
Creatinine, Ser: 8.08 — ABNORMAL HIGH
GFR calc Af Amer: 10 — ABNORMAL LOW
GFR calc Af Amer: 16 — ABNORMAL LOW
GFR calc Af Amer: 28 — ABNORMAL LOW
GFR calc Af Amer: 9 — ABNORMAL LOW
GFR calc non Af Amer: 23 — ABNORMAL LOW
GFR calc non Af Amer: 7 — ABNORMAL LOW
Glucose, Bld: 127 — ABNORMAL HIGH
Glucose, Bld: 139 — ABNORMAL HIGH
Glucose, Bld: 156 — ABNORMAL HIGH
Glucose, Bld: 172 — ABNORMAL HIGH
Potassium: 3.8
Potassium: 4.1
Sodium: 139
Sodium: 139

## 2011-01-05 LAB — CBC
HCT: 24.1 — ABNORMAL LOW
HCT: 25.2 — ABNORMAL LOW
HCT: 29.1 — ABNORMAL LOW
HCT: 35.8 — ABNORMAL LOW
Hemoglobin: 10 — ABNORMAL LOW
Hemoglobin: 11.6 — ABNORMAL LOW
Hemoglobin: 13.3
Hemoglobin: 8.3 — ABNORMAL LOW
MCHC: 34.3
MCHC: 34.4
MCHC: 34.8
MCHC: 34.8
MCV: 90.1
MCV: 90.9
MCV: 91
MCV: 91.2
MCV: 91.6
Platelets: 132 — ABNORMAL LOW
Platelets: 148 — ABNORMAL LOW
Platelets: 173
Platelets: 279
RBC: 2.64 — ABNORMAL LOW
RBC: 3.26 — ABNORMAL LOW
RBC: 3.67 — ABNORMAL LOW
RBC: 4.2 — ABNORMAL LOW
RBC: 4.45
RDW: 12.9
RDW: 13.9
RDW: 13.9
WBC: 4.8
WBC: 8.1
WBC: 8.3
WBC: 8.9
WBC: 9.1

## 2011-01-05 LAB — URINE MICROSCOPIC-ADD ON

## 2011-01-05 LAB — COMPREHENSIVE METABOLIC PANEL
ALT: 28
ALT: 28
ALT: 44
ALT: 54 — ABNORMAL HIGH
AST: 67 — ABNORMAL HIGH
Albumin: 1.5 — ABNORMAL LOW
Albumin: 1.8 — ABNORMAL LOW
Albumin: 3.7
Alkaline Phosphatase: 40
BUN: 44 — ABNORMAL HIGH
BUN: 51 — ABNORMAL HIGH
BUN: 66 — ABNORMAL HIGH
CO2: 19
CO2: 21
CO2: 26
Calcium: 7.1 — ABNORMAL LOW
Calcium: 7.4 — ABNORMAL LOW
Calcium: 8 — ABNORMAL LOW
Chloride: 110
Chloride: 111
Chloride: 99
Creatinine, Ser: 7.27 — ABNORMAL HIGH
Creatinine, Ser: 8.61 — ABNORMAL HIGH
GFR calc Af Amer: 10 — ABNORMAL LOW
GFR calc Af Amer: >60
GFR calc non Af Amer: 38 — ABNORMAL LOW
GFR calc non Af Amer: 7 — ABNORMAL LOW
GFR calc non Af Amer: >60
Glucose, Bld: 118 — ABNORMAL HIGH
Glucose, Bld: 192 — ABNORMAL HIGH
Glucose, Bld: 195 — ABNORMAL HIGH
Potassium: 3.7
Potassium: 4.4
Sodium: 132 — ABNORMAL LOW
Sodium: 139
Sodium: 140
Sodium: 141
Total Bilirubin: 0.7
Total Bilirubin: 1.2
Total Bilirubin: 1.4 — ABNORMAL HIGH
Total Protein: 5 — ABNORMAL LOW
Total Protein: 5.3 — ABNORMAL LOW

## 2011-01-05 LAB — POCT I-STAT 3, ART BLOOD GAS (G3+)
Acid-base deficit: 4 — ABNORMAL HIGH
Acid-base deficit: 7 — ABNORMAL HIGH
Bicarbonate: 16.7 — ABNORMAL LOW
Bicarbonate: 17.7 — ABNORMAL LOW
Bicarbonate: 20.1
Bicarbonate: 21.5
O2 Saturation: 96
O2 Saturation: 97
O2 Saturation: 97
Operator id: 277551
Operator id: 280991
Patient temperature: 101.5
Patient temperature: 98.4
Patient temperature: 98.6
TCO2: 19
TCO2: 22
TCO2: 22
TCO2: 23
pCO2 arterial: 38.3
pCO2 arterial: 42.6
pCO2 arterial: 52 — ABNORMAL HIGH
pH, Arterial: 7.196 — CL
pO2, Arterial: 103 — ABNORMAL HIGH
pO2, Arterial: 124 — ABNORMAL HIGH
pO2, Arterial: 95

## 2011-01-05 LAB — I-STAT EC8
Acid-base deficit: 1
Acid-base deficit: 9 — ABNORMAL HIGH
Hemoglobin: 14.6
Potassium: 4.9
Sodium: 130 — ABNORMAL LOW
TCO2: 21
TCO2: 26
pCO2 arterial: 54.9 — ABNORMAL HIGH
pH, Arterial: 7.166 — CL

## 2011-01-05 LAB — BODY FLUID CULTURE: Culture: NO GROWTH

## 2011-01-05 LAB — PREPARE FRESH FROZEN PLASMA

## 2011-01-05 LAB — TRIGLYCERIDES
Triglycerides: 295 — ABNORMAL HIGH
Triglycerides: 542 — ABNORMAL HIGH

## 2011-01-05 LAB — BLOOD GAS, ARTERIAL
Acid-Base Excess: 0.4
Drawn by: 274481
FIO2: 0.21
pCO2 arterial: 34 — ABNORMAL LOW
pH, Arterial: 7.459 — ABNORMAL HIGH
pO2, Arterial: 76.4 — ABNORMAL LOW

## 2011-01-05 LAB — PREPARE PLATELET PHERESIS

## 2011-01-05 LAB — TYPE AND SCREEN

## 2011-01-05 LAB — PHOSPHORUS
Phosphorus: 3.6
Phosphorus: 3.7
Phosphorus: 4.2

## 2011-01-05 LAB — URINALYSIS, ROUTINE W REFLEX MICROSCOPIC
Ketones, ur: 15 — AB
Nitrite: NEGATIVE
pH: 6.5

## 2011-01-05 LAB — DIFFERENTIAL
Basophils Absolute: 0
Eosinophils Absolute: 0.2
Lymphocytes Relative: 16
Lymphs Abs: 1.4
Lymphs Abs: 1.5
Monocytes Absolute: 0.8 — ABNORMAL HIGH
Monocytes Relative: 10
Monocytes Relative: 11
Neutro Abs: 6.4
Neutro Abs: 6.5
Neutrophils Relative %: 70

## 2011-01-05 LAB — CULTURE, BLOOD (ROUTINE X 2): Culture: NO GROWTH

## 2011-01-05 LAB — CHOLESTEROL, TOTAL
Cholesterol: 126
Cholesterol: 170

## 2011-01-05 LAB — PREALBUMIN: Prealbumin: 9.4 — ABNORMAL LOW

## 2011-01-05 LAB — MAGNESIUM
Magnesium: 1.2 — ABNORMAL LOW
Magnesium: 1.7
Magnesium: 2

## 2011-01-05 LAB — AMYLASE: Amylase: 251 — ABNORMAL HIGH

## 2011-01-05 LAB — ANAEROBIC CULTURE: Gram Stain: NONE SEEN

## 2011-01-05 LAB — SODIUM, URINE, RANDOM: Sodium, Ur: <10

## 2011-01-05 LAB — POCT I-STAT 4, (NA,K, GLUC, HGB,HCT)
Hemoglobin: 11.6 — ABNORMAL LOW
Potassium: 5.1
Sodium: 137

## 2011-01-05 LAB — LIPASE, BLOOD
Lipase: 283 — ABNORMAL HIGH
Lipase: 327 — ABNORMAL HIGH

## 2011-01-05 LAB — LACTIC ACID, PLASMA: Lactic Acid, Venous: 1.4

## 2015-01-15 ENCOUNTER — Encounter (HOSPITAL_COMMUNITY): Payer: Self-pay | Admitting: Emergency Medicine

## 2015-01-15 ENCOUNTER — Emergency Department (HOSPITAL_COMMUNITY): Payer: 59

## 2015-01-15 ENCOUNTER — Observation Stay (HOSPITAL_COMMUNITY)
Admission: EM | Admit: 2015-01-15 | Discharge: 2015-01-16 | Disposition: A | Discharge status: Home / Self Care | Payer: 59 | Attending: Internal Medicine | Admitting: Internal Medicine

## 2015-01-15 DIAGNOSIS — Z79899 Other long term (current) drug therapy: Secondary | ICD-10-CM | POA: Diagnosis not present

## 2015-01-15 DIAGNOSIS — E785 Hyperlipidemia, unspecified: Secondary | ICD-10-CM | POA: Insufficient documentation

## 2015-01-15 DIAGNOSIS — S066X0A Traumatic subarachnoid hemorrhage without loss of consciousness, initial encounter: Secondary | ICD-10-CM | POA: Diagnosis not present

## 2015-01-15 DIAGNOSIS — S0191XA Laceration without foreign body of unspecified part of head, initial encounter: Secondary | ICD-10-CM | POA: Diagnosis not present

## 2015-01-15 DIAGNOSIS — Z6830 Body mass index (BMI) 30.0-30.9, adult: Secondary | ICD-10-CM | POA: Diagnosis not present

## 2015-01-15 DIAGNOSIS — S066XAA Traumatic subarachnoid hemorrhage with loss of consciousness status unknown, initial encounter: Secondary | ICD-10-CM | POA: Diagnosis present

## 2015-01-15 DIAGNOSIS — S066X9A Traumatic subarachnoid hemorrhage with loss of consciousness of unspecified duration, initial encounter: Secondary | ICD-10-CM | POA: Diagnosis present

## 2015-01-15 DIAGNOSIS — Z7722 Contact with and (suspected) exposure to environmental tobacco smoke (acute) (chronic): Secondary | ICD-10-CM | POA: Diagnosis not present

## 2015-01-15 DIAGNOSIS — I714 Abdominal aortic aneurysm, without rupture: Secondary | ICD-10-CM | POA: Insufficient documentation

## 2015-01-15 DIAGNOSIS — Z9114 Patient's other noncompliance with medication regimen: Secondary | ICD-10-CM | POA: Diagnosis not present

## 2015-01-15 DIAGNOSIS — F10129 Alcohol abuse with intoxication, unspecified: Secondary | ICD-10-CM | POA: Insufficient documentation

## 2015-01-15 DIAGNOSIS — I129 Hypertensive chronic kidney disease with stage 1 through stage 4 chronic kidney disease, or unspecified chronic kidney disease: Secondary | ICD-10-CM | POA: Diagnosis not present

## 2015-01-15 DIAGNOSIS — E1122 Type 2 diabetes mellitus with diabetic chronic kidney disease: Secondary | ICD-10-CM | POA: Diagnosis not present

## 2015-01-15 DIAGNOSIS — D696 Thrombocytopenia, unspecified: Secondary | ICD-10-CM | POA: Insufficient documentation

## 2015-01-15 DIAGNOSIS — W19XXXA Unspecified fall, initial encounter: Secondary | ICD-10-CM | POA: Insufficient documentation

## 2015-01-15 DIAGNOSIS — E669 Obesity, unspecified: Secondary | ICD-10-CM | POA: Diagnosis not present

## 2015-01-15 DIAGNOSIS — F10929 Alcohol use, unspecified with intoxication, unspecified: Secondary | ICD-10-CM

## 2015-01-15 DIAGNOSIS — Y908 Blood alcohol level of 240 mg/100 ml or more: Secondary | ICD-10-CM | POA: Insufficient documentation

## 2015-01-15 DIAGNOSIS — I609 Nontraumatic subarachnoid hemorrhage, unspecified: Secondary | ICD-10-CM

## 2015-01-15 DIAGNOSIS — N183 Chronic kidney disease, stage 3 (moderate): Secondary | ICD-10-CM | POA: Insufficient documentation

## 2015-01-15 DIAGNOSIS — F10120 Alcohol abuse with intoxication, uncomplicated: Secondary | ICD-10-CM | POA: Diagnosis not present

## 2015-01-15 DIAGNOSIS — F10920 Alcohol use, unspecified with intoxication, uncomplicated: Secondary | ICD-10-CM

## 2015-01-15 LAB — BASIC METABOLIC PANEL
Anion gap: 11 (ref 5–15)
BUN: 11 mg/dL (ref 6–20)
CALCIUM: 8.8 mg/dL — AB (ref 8.9–10.3)
CHLORIDE: 104 mmol/L (ref 101–111)
CO2: 20 mmol/L — AB (ref 22–32)
CREATININE: 0.88 mg/dL (ref 0.61–1.24)
GFR calc non Af Amer: >60 mL/min (ref >60)
GLUCOSE: 320 mg/dL — AB (ref 65–99)
Potassium: 4.3 mmol/L (ref 3.5–5.1)
Sodium: 135 mmol/L (ref 135–145)

## 2015-01-15 LAB — CBC WITH DIFFERENTIAL/PLATELET
Basophils Absolute: 0 10*3/uL (ref 0.0–0.1)
Basophils Relative: 0 %
Eosinophils Absolute: 0.1 10*3/uL (ref 0.0–0.7)
Eosinophils Relative: 1 %
HEMATOCRIT: 42.9 % (ref 39.0–52.0)
HEMOGLOBIN: 15 g/dL (ref 13.0–17.0)
LYMPHS ABS: 2.6 10*3/uL (ref 0.7–4.0)
LYMPHS PCT: 44 %
MCH: 33 pg (ref 26.0–34.0)
MCHC: 35 g/dL (ref 30.0–36.0)
MCV: 94.5 fL (ref 78.0–100.0)
Monocytes Absolute: 0.3 10*3/uL (ref 0.1–1.0)
Monocytes Relative: 5 %
NEUTROS ABS: 2.9 10*3/uL (ref 1.7–7.7)
NEUTROS PCT: 50 %
Platelets: 130 10*3/uL — ABNORMAL LOW (ref 150–400)
RBC: 4.54 MIL/uL (ref 4.22–5.81)
RDW: 12.7 % (ref 11.5–15.5)
WBC: 5.8 10*3/uL (ref 4.0–10.5)

## 2015-01-15 LAB — URINE MICROSCOPIC-ADD ON

## 2015-01-15 LAB — RAPID URINE DRUG SCREEN, HOSP PERFORMED
Amphetamines: NOT DETECTED
Barbiturates: NOT DETECTED
Benzodiazepines: NOT DETECTED
Cocaine: NOT DETECTED
OPIATES: NOT DETECTED
Tetrahydrocannabinol: NOT DETECTED

## 2015-01-15 LAB — URINALYSIS, ROUTINE W REFLEX MICROSCOPIC
BILIRUBIN URINE: NEGATIVE
KETONES UR: NEGATIVE mg/dL
Leukocytes, UA: NEGATIVE
Nitrite: NEGATIVE
PROTEIN: 100 mg/dL — AB
Specific Gravity, Urine: 1.022 (ref 1.005–1.030)
UROBILINOGEN UA: 0.2 mg/dL (ref 0.0–1.0)
pH: 5.5 (ref 5.0–8.0)

## 2015-01-15 LAB — APTT: APTT: 31 s (ref 24–37)

## 2015-01-15 LAB — ETHANOL: Alcohol, Ethyl (B): 312 mg/dL (ref <5)

## 2015-01-15 LAB — PROTIME-INR
INR: 1.05 (ref 0.00–1.49)
Prothrombin Time: 13.9 seconds (ref 11.6–15.2)

## 2015-01-15 LAB — CBG MONITORING, ED: Glucose-Capillary: 295 mg/dL — ABNORMAL HIGH (ref 65–99)

## 2015-01-15 LAB — GLUCOSE, CAPILLARY: GLUCOSE-CAPILLARY: 265 mg/dL — AB (ref 65–99)

## 2015-01-15 MED ORDER — HYDRALAZINE HCL 20 MG/ML IJ SOLN: 10.0000 mg | INTRAMUSCULAR | Status: DC | PRN

## 2015-01-15 MED ORDER — ACETAMINOPHEN 650 MG RE SUPP
650.0000 mg | Freq: Four times a day (QID) | RECTAL | Status: DC | PRN
Start: 2015-01-15 — End: 2015-01-16

## 2015-01-15 MED ORDER — VITAMIN B-1 100 MG PO TABS
100.0000 mg | ORAL_TABLET | Freq: Every day | ORAL | Status: DC
  Administered 2015-01-16: 100 mg via ORAL
  Filled 2015-01-15: qty 1

## 2015-01-15 MED ORDER — FOLIC ACID 1 MG PO TABS
1.0000 mg | ORAL_TABLET | Freq: Every day | ORAL | Status: DC
  Administered 2015-01-16: 1 mg via ORAL
  Filled 2015-01-15: qty 1

## 2015-01-15 MED ORDER — LORAZEPAM 2 MG/ML IJ SOLN: 1.0000 mg | Freq: Four times a day (QID) | INTRAMUSCULAR | Status: DC | PRN

## 2015-01-15 MED ORDER — ACETAMINOPHEN 325 MG PO TABS: 650.0000 mg | ORAL_TABLET | Freq: Four times a day (QID) | ORAL | Status: DC | PRN

## 2015-01-15 MED ORDER — SODIUM CHLORIDE 0.9 % IV SOLN
INTRAVENOUS | Status: DC
  Administered 2015-01-15: via INTRAVENOUS

## 2015-01-15 MED ORDER — LORAZEPAM 1 MG PO TABS: 0.0000 mg | ORAL_TABLET | Freq: Two times a day (BID) | ORAL | Status: DC

## 2015-01-15 MED ORDER — LORAZEPAM 1 MG PO TABS: 1.0000 mg | ORAL_TABLET | Freq: Four times a day (QID) | ORAL | Status: DC | PRN

## 2015-01-15 MED ORDER — ONDANSETRON HCL 4 MG/2ML IJ SOLN
4.0000 mg | Freq: Four times a day (QID) | INTRAMUSCULAR | Status: DC | PRN
Start: 2015-01-15 — End: 2015-01-16

## 2015-01-15 MED ORDER — INSULIN ASPART 100 UNIT/ML ~~LOC~~ SOLN
0.0000 [IU] | Freq: Three times a day (TID) | SUBCUTANEOUS | Status: DC
  Administered 2015-01-16 (×2): 5 [IU] via SUBCUTANEOUS

## 2015-01-15 MED ORDER — SODIUM CHLORIDE 0.9 % IV SOLN
INTRAVENOUS | Status: DC
  Administered 2015-01-15: 21:00:00 via INTRAVENOUS

## 2015-01-15 MED ORDER — ONDANSETRON HCL 4 MG PO TABS: 4.0000 mg | ORAL_TABLET | Freq: Four times a day (QID) | ORAL | Status: DC | PRN

## 2015-01-15 MED ORDER — ADULT MULTIVITAMIN W/MINERALS CH
1.0000 | ORAL_TABLET | Freq: Every day | ORAL | Status: DC
  Administered 2015-01-16: 1 via ORAL
  Filled 2015-01-15: qty 1

## 2015-01-15 MED ORDER — LORAZEPAM 1 MG PO TABS: 0.0000 mg | ORAL_TABLET | Freq: Four times a day (QID) | ORAL | Status: DC

## 2015-01-15 MED ORDER — THIAMINE HCL 100 MG/ML IJ SOLN: 100.0000 mg | Freq: Every day | INTRAMUSCULAR | Status: DC

## 2015-01-15 NOTE — ED Notes (Signed)
Per [facility] pt had fall outside , hit head, no loc by bystanders. ETOH on board per [facility]s. Pt co head left abrasion. Pt alert and oriented x 4 per ems.

## 2015-01-15 NOTE — ED Notes (Signed)
Charge gave pt back his wallet, informed pt that his knife is labeled and with security, so when he is discharged pt needs to stop by security to pick up knife.

## 2015-01-15 NOTE — Progress Notes (Signed)
Pt confirmed with ED CM that his pcp is Dr Deterding  Pt requesting CM removed his neck brace CM discussed with him that it can not be removed because of his fall and until after his tests and imaging completed to make sure he is ok  Pt assisted to adjust the head of the stretcher

## 2015-01-15 NOTE — H&P (Signed)
[facility] History and Physical  Christopher ReichertManuel Shaffer WUJ:[id number] DOB: 08-11-1965 DOA: 01/15/2015  Referring physician: Dr.Liu. PCP: Trevor IhaETERDING,JAMES L, MD  Specialists: None.  Chief Complaint: Fall.  HPI: Christopher ReichertManuel Voeltz is a 49 y.o. male with history of diabetes mellitus, hypertension hyperlipidemia on no medications presently and history of alcoholism was brought to the ER after patient had a fall. Patient does not recall clearly what happened. Patient states he was having alcohol and may have had gone to the street to have some food when he may have had a fall. In the ER patient was intoxicated and CT head shows small subarachnoid hemorrhage on the left frontal area and on call neurosurgeon Dr. Yetta BarreJones was consulted by the ER physician. As per the neurosurgeon Dr. Yetta BarreJones patient at this time to be observed and if there is any neurological changes then to have a repeat CT head in this patient to be discharged home on concussion instructions. ER physician had removed the c-collar after patient became more sober. Patient on my exam denies any chest pain shortness of breath palpitations headache visual symptoms focal deficits abdominal pain nausea vomiting or diarrhea. Is able to move all extremities. Patient states he has not been taking his medications as he has not followed up with his primary care physicians. I have also discussed with patient's wife and patient's wife was not at the site when the incident happened. Patient did not have any tongue bite or incontinence of urine.  Review of Systems: As presented in the history of presenting illness, rest negative.  Past Medical History  Diagnosis Date  . Chronic kidney disease, stage III (moderate)     Deterding  . Other and unspecified hyperlipidemia   . Type II or unspecified type diabetes mellitus without mention of complication, not stated as uncontrolled   . Hypertension   . Obesity (BMI 30-39.9)   . Abdominal aneurysm [facility](HCC)      Patient reported    Past Surgical History  Procedure Laterality Date  . Stomach vessel aneurysm      Patient reported    Social History:  reports that he has been passively smoking.  He has never used smokeless tobacco. He reports that he drinks about 7.2 oz of alcohol per week. He reports that he does not use illicit drugs. Where does patient live home. Can patient participate in ADLs? Yes.  No Known Allergies  Family History:  Family History  Problem Relation Age of Onset  . Diabetes Mellitus II Mother       Prior to Admission medications   Medication Sig Start Date End Date Taking? Authorizing Provider  lisinopril-hydrochlorothiazide (PRINZIDE,ZESTORETIC) 20-25 MG tablet Take 1 tablet by mouth daily.   Yes Historical Provider, MD  [facility] 625 MG tablet Take 1,875 mg by mouth 2 (two) times daily.  10/28/14  Yes Historical Provider, MD  simvastatin (ZOCOR) 40 MG tablet Take 40 mg by mouth daily.      Historical Provider, MD    Physical Exam: Filed Vitals:   01/15/15 1740 01/15/15 1958 01/15/15 1959 01/15/15 2215  BP: 123/66 135/70 135/70 140/90  Pulse: 104 92 80 106  Temp: 98.6 F (37 C)   98 F (36.7 C)  TempSrc: Oral   Oral  Resp: 22 23 18 20   Height:    6' (1.829 m)  Weight:    108.001 kg (238 lb 1.6 oz)  SpO2: 95% 95% 100% 97%     General:  Moderately built and nourished.  Eyes: Right eye  is prosthetic.  ENT: No discharge from the ears eyes nose and mouth.  Neck: No neck rigidity. No localized tenderness.  Cardiovascular: S1-S2 heard.  Respiratory: No rhonchi or crepitations.  Abdomen: Soft nontender bowel sounds present.  Skin: No rash.  Musculoskeletal: No edema.  Psychiatric: Appears normal.  Neurologic: Alert awake oriented to time place and person. Moves all extremities 5 x 5. No facial asymmetry.  Labs on Admission:  Basic Metabolic Panel:  Recent Labs Lab 01/15/15 1949  NA 135  K 4.3  CL 104  CO2 20*  GLUCOSE 320*  BUN 11   CREATININE 0.88  CALCIUM 8.8*   Liver Function Tests: No results for input(s): AST, ALT, ALKPHOS, BILITOT, PROT, ALBUMIN in the last 168 hours. No results for input(s): LIPASE, AMYLASE in the last 168 hours. No results for input(s): AMMONIA in the last 168 hours. CBC:  Recent Labs Lab 01/15/15 1949  WBC 5.8  NEUTROABS 2.9  HGB 15.0  HCT 42.9  MCV 94.5  PLT 130*   Cardiac Enzymes: No results for input(s): CKTOTAL, CKMB, CKMBINDEX, TROPONINI in the last 168 hours.  BNP (last 3 results) No results for input(s): BNP in the last 8760 hours.  ProBNP (last 3 results) No results for input(s): PROBNP in the last 8760 hours.  CBG:  Recent Labs Lab 01/15/15 1847 01/15/15 2238  GLUCAP 295* 265*    Radiological Exams on Admission: Ct Head Wo Contrast  01/15/2015  CLINICAL DATA:  49 year old male with history of trauma from a fall. Left head laceration. EXAM: CT HEAD WITHOUT CONTRAST CT CERVICAL SPINE WITHOUT CONTRAST TECHNIQUE: Multidetector CT imaging of the head and cervical spine was performed following the standard protocol without intravenous contrast. Multiplanar CT image reconstructions of the cervical spine were also generated. COMPARISON:  Cervical spine CT 03/02/2010. FINDINGS: CT HEAD FINDINGS A small amount of extra-axial high attenuation in the sulci of the left frontal region, extending into the inferior aspect of the interhemispheric fissure, compatible with subarachnoid hemorrhage. No intraparenchymal hemorrhage is identified. No significant mass effect or midline shift. No hydrocephalus. No acute displaced skull fractures. Evidence of prior trauma to the right lateral orbit and zygomatic region with postoperative changes of ORIF. Prosthetic right globe. CT CERVICAL SPINE FINDINGS No acute displaced cervical spine fractures. Straightening of normal cervical lordosis. Alignment is otherwise anatomic. Prevertebral soft tissues are normal. Multilevel degenerative disc  disease, most pronounced C5-C6, C6-C7 and C7-T1. Multilevel facet arthropathy. Visualized portions of the upper thorax are unremarkable. IMPRESSION: 1. Small amount of subarachnoid hemorrhage in the left frontal region, as above. No intraventricular blood and no findings to suggest hydrocephalus at this time. 2. No evidence of significant acute traumatic injury to the cervical spine. 3. Multilevel degenerative disc disease and cervical spondylosis, as above. 4. Additional incidental findings, as above. Critical Value/emergent results were called by telephone at the time of interpretation on 01/15/2015 at 7:27 pm to Dr. Crista Curb, who verbally acknowledged these results. Electronically Signed   By: Trudie Reed M.D.   On: 01/15/2015 19:28   Ct Cervical Spine Wo Contrast  01/15/2015  CLINICAL DATA:  49 year old male with history of trauma from a fall. Left head laceration. EXAM: CT HEAD WITHOUT CONTRAST CT CERVICAL SPINE WITHOUT CONTRAST TECHNIQUE: Multidetector CT imaging of the head and cervical spine was performed following the standard protocol without intravenous contrast. Multiplanar CT image reconstructions of the cervical spine were also generated. COMPARISON:  Cervical spine CT 03/02/2010. FINDINGS: CT HEAD FINDINGS A small amount of  extra-axial high attenuation in the sulci of the left frontal region, extending into the inferior aspect of the interhemispheric fissure, compatible with subarachnoid hemorrhage. No intraparenchymal hemorrhage is identified. No significant mass effect or midline shift. No hydrocephalus. No acute displaced skull fractures. Evidence of prior trauma to the right lateral orbit and zygomatic region with postoperative changes of ORIF. Prosthetic right globe. CT CERVICAL SPINE FINDINGS No acute displaced cervical spine fractures. Straightening of normal cervical lordosis. Alignment is otherwise anatomic. Prevertebral soft tissues are normal. Multilevel degenerative disc disease,  most pronounced C5-C6, C6-C7 and C7-T1. Multilevel facet arthropathy. Visualized portions of the upper thorax are unremarkable. IMPRESSION: 1. Small amount of subarachnoid hemorrhage in the left frontal region, as above. No intraventricular blood and no findings to suggest hydrocephalus at this time. 2. No evidence of significant acute traumatic injury to the cervical spine. 3. Multilevel degenerative disc disease and cervical spondylosis, as above. 4. Additional incidental findings, as above. Critical Value/emergent results were called by telephone at the time of interpretation on 01/15/2015 at 7:27 pm to Dr. Crista Curb, who verbally acknowledged these results. Electronically Signed   By: Trudie Reed M.D.   On: 01/15/2015 19:28    EKG: Independently reviewed. Sinus tachycardia.  Assessment/Plan Principal Problem:   Traumatic subarachnoid hemorrhage (HCC) Active Problems:   Alcohol intoxication (HCC)   SAH (subarachnoid hemorrhage) (HCC)   1. Traumatic subarachnoid hemorrhage - patient denies any headache at this time and is nonfocal. Appreciate neurosurgical notes by Dr. Yetta Barre. As per Dr. Yetta Barre if there's no neurological changes and patient can be discharged home with concussion instructions. I have ordered a repeat CT head for AM and also x-ray of the neck. 2. Alcohol intoxication and fall - patient does not recall the incident completely. At this time I placing patient on telemetry to rule out any arrhythmias and also check EEG and 2-D echo. Patient has been placed on CIWA protocol. 3. Diabetes mellitus type 2 uncontrolled - patient has not been taking his medications for many months now. I have placed patient on Lantus 5 units with sliding scale coverage. Check hemoglobin A1c. 4. Hypertension - patient is not taking any medications at this time. For now I have placed patient on when necessary IV hydralazine for systolic blood pressure more than 140. Closely follow blood pressure  trends. 5. Thrombocytopenia probably from alcoholism - follow CBC.  Chest x-ray is pending.  DVT Prophylaxis SCDs.  Code Status: Full code.  Family Communication: Discussed patient's wife.  Disposition Plan: Admit for observation.    KAKRAKANDY,ARSHAD N. [facility] Pager [phone].  If 7PM-7AM, please contact night-coverage [email or web address] Password [email or web address] 01/15/2015, 11:28 PM

## 2015-01-15 NOTE — ED Provider Notes (Addendum)
CSN: [id number]     Arrival date & time 01/15/15  1728 History   First MD Initiated Contact with Patient 01/15/15 1736     Chief Complaint  Patient presents with  . fall/etoh  on board      (Consider location/radiation/quality/duration/timing/severity/associated sxs/prior Treatment) HPI 49 year old male who presents with fall and alcohol intoxication. History of DM-2, CKD III, HTN, AAA. Brought in by EMS as he had fallen and hit his head outside of the hospital. Admits to drinking alcohol today, unsure of how much. Denies other drug use. Denies LOC, nausea, vomiting, chest pain, neck pain, back pain, abdominal pain, extremity pain or injury. Reports being in usual state of health.   Past Medical History  Diagnosis Date  . Chronic kidney disease, stage III (moderate)     Deterding  . Other and unspecified hyperlipidemia   . Type II or unspecified type diabetes mellitus without mention of complication, not stated as uncontrolled   . Hypertension   . Obesity (BMI 30-39.9)   . Abdominal aneurysm [facility](HCC)     Patient reported    Past Surgical History  Procedure Laterality Date  . Stomach vessel aneurysm      Patient reported    History reviewed. No pertinent family history. Social History  Substance Use Topics  . Smoking status: Passive Smoke Exposure - Never Smoker  . Smokeless tobacco: Never Used  . Alcohol Use: 7.2 oz/week    12 Cans of beer per week    Review of Systems 10/14 systems reviewed and are negative other than those stated in the HPI    Allergies  Review of patient's allergies indicates no known allergies.  Home Medications   Prior to Admission medications   Medication Sig Start Date End Date Taking? Authorizing Provider  lisinopril-hydrochlorothiazide (PRINZIDE,ZESTORETIC) 20-25 MG tablet Take 1 tablet by mouth daily.   Yes Historical Provider, MD  [facility] 625 MG tablet Take 1,875 mg by mouth 2 (two) times daily.  10/28/14  Yes Historical Provider, MD   simvastatin (ZOCOR) 40 MG tablet Take 40 mg by mouth daily.      Historical Provider, MD   BP 135/70 mmHg  Pulse 80  Temp(Src) 98.6 F (37 C) (Oral)  Resp 18  SpO2 100% Physical Exam Physical Exam  Nursing note and vitals reviewed. Constitutional: Well developed, well nourished, non-toxic, and in no acute distress Head: Normocephalic and atraumatic.  Mouth/Throat: Oropharynx is clear and moist.  Neck: Normal range of motion. Neck supple.  Cardiovascular: Normal rate and regular rhythm.  No edema. Pulmonary/Chest: Effort normal and breath sounds normal. No chest wall tenderness. Abdominal: Soft. There is no tenderness. There is no rebound and no guarding.  Musculoskeletal: Normal range of motion.  Neurological: Alert, oriented x 3, slurring of speech,  moves all extremities symmetrically Skin: Skin is warm and dry.  Psychiatric: Cooperative  ED Course  Procedures (including critical care time) Labs Review Labs Reviewed  CBC WITH DIFFERENTIAL/PLATELET - Abnormal; Notable for the following:    Platelets 130 (*)    All other components within normal limits  CBG MONITORING, ED - Abnormal; Notable for the following:    Glucose-Capillary 295 (*)    All other components within normal limits  APTT  PROTIME-INR  BASIC METABOLIC PANEL  ETHANOL    Imaging Review Ct Head Wo Contrast  01/15/2015  CLINICAL DATA:  49 year old male with history of trauma from a fall. Left head laceration. EXAM: CT HEAD WITHOUT CONTRAST CT CERVICAL SPINE WITHOUT  CONTRAST TECHNIQUE: Multidetector CT imaging of the head and cervical spine was performed following the standard protocol without intravenous contrast. Multiplanar CT image reconstructions of the cervical spine were also generated. COMPARISON:  Cervical spine CT 03/02/2010. FINDINGS: CT HEAD FINDINGS A small amount of extra-axial high attenuation in the sulci of the left frontal region, extending into the inferior aspect of the interhemispheric  fissure, compatible with subarachnoid hemorrhage. No intraparenchymal hemorrhage is identified. No significant mass effect or midline shift. No hydrocephalus. No acute displaced skull fractures. Evidence of prior trauma to the right lateral orbit and zygomatic region with postoperative changes of ORIF. Prosthetic right globe. CT CERVICAL SPINE FINDINGS No acute displaced cervical spine fractures. Straightening of normal cervical lordosis. Alignment is otherwise anatomic. Prevertebral soft tissues are normal. Multilevel degenerative disc disease, most pronounced C5-C6, C6-C7 and C7-T1. Multilevel facet arthropathy. Visualized portions of the upper thorax are unremarkable. IMPRESSION: 1. Small amount of subarachnoid hemorrhage in the left frontal region, as above. No intraventricular blood and no findings to suggest hydrocephalus at this time. 2. No evidence of significant acute traumatic injury to the cervical spine. 3. Multilevel degenerative disc disease and cervical spondylosis, as above. 4. Additional incidental findings, as above. Critical Value/emergent results were called by telephone at the time of interpretation on 01/15/2015 at 7:27 pm to Dr. Crista Curb, who verbally acknowledged these results. Electronically Signed   By: Trudie Reed M.D.   On: 01/15/2015 19:28   Ct Cervical Spine Wo Contrast  01/15/2015  CLINICAL DATA:  49 year old male with history of trauma from a fall. Left head laceration. EXAM: CT HEAD WITHOUT CONTRAST CT CERVICAL SPINE WITHOUT CONTRAST TECHNIQUE: Multidetector CT imaging of the head and cervical spine was performed following the standard protocol without intravenous contrast. Multiplanar CT image reconstructions of the cervical spine were also generated. COMPARISON:  Cervical spine CT 03/02/2010. FINDINGS: CT HEAD FINDINGS A small amount of extra-axial high attenuation in the sulci of the left frontal region, extending into the inferior aspect of the interhemispheric fissure,  compatible with subarachnoid hemorrhage. No intraparenchymal hemorrhage is identified. No significant mass effect or midline shift. No hydrocephalus. No acute displaced skull fractures. Evidence of prior trauma to the right lateral orbit and zygomatic region with postoperative changes of ORIF. Prosthetic right globe. CT CERVICAL SPINE FINDINGS No acute displaced cervical spine fractures. Straightening of normal cervical lordosis. Alignment is otherwise anatomic. Prevertebral soft tissues are normal. Multilevel degenerative disc disease, most pronounced C5-C6, C6-C7 and C7-T1. Multilevel facet arthropathy. Visualized portions of the upper thorax are unremarkable. IMPRESSION: 1. Small amount of subarachnoid hemorrhage in the left frontal region, as above. No intraventricular blood and no findings to suggest hydrocephalus at this time. 2. No evidence of significant acute traumatic injury to the cervical spine. 3. Multilevel degenerative disc disease and cervical spondylosis, as above. 4. Additional incidental findings, as above. Critical Value/emergent results were called by telephone at the time of interpretation on 01/15/2015 at 7:27 pm to Dr. Crista Curb, who verbally acknowledged these results. Electronically Signed   By: Trudie Reed M.D.   On: 01/15/2015 19:28   I have personally reviewed and evaluated these images and lab results as part of my medical decision-making.  CRITICAL CARE Performed by: Lavera Guise   Total critical care time: 30 minutes  Critical care time was exclusive of separately billable procedures and treating other patients.  Critical care was necessary to treat or prevent imminent or life-threatening deterioration.  Critical care was time spent personally by  me on the following activities: development of treatment plan with patient and/or surrogate as well as nursing, discussions with consultants, evaluation of patient's response to treatment, examination of patient, obtaining  history from patient or surrogate, ordering and performing treatments and interventions, ordering and review of laboratory studies, ordering and review of radiographic studies, pulse oximetry and re-evaluation of patient's condition.   MDM   Final diagnoses:  Alcohol intoxication, uncomplicated (HCC)  Traumatic subarachnoid hemorrhage, without loss of consciousness, initial encounter [facility])   49 year old male who presents with alcohol intoxication and mechanical fall. Appears intoxicated on arrival, but grossly neurologically intact. Vital signs are stable. No evidence of acute injury on exam. Due to intoxication, CT head and cervical spine was performed. Visualized and reviewed with radiology, showing evidence of a small left frontal subarachnoid hemorrhage. CT cervical spine without acute traumatic injury, but cannot be cleared from his cervical collar due to intoxication. Discussed with neurosurgery on call, Dr. Yetta Barre. Recommended observation, and no intervention. No repeat CT head required if clinically sober and appropriate in the morning. Cervical collar will remain on until clinically sober. Discussed with triad hospitalist who will admit for observation.   Lavera Guise, MD 01/15/15 1027  Lavera Guise, MD 01/15/15 2224

## 2015-01-15 NOTE — ED Notes (Signed)
Bed: [facility] Expected date:  Expected time:  Means of arrival:  Comments: Ems- ETOH 

## 2015-01-15 NOTE — Progress Notes (Signed)
I was called to review the head CT on this pt. apparently intoxicated per EDP, witnessed mechanical fall from standing. Head CT shows minimal tSAH in interhemispheric fissure and small L frontal hyperdensity c/w tSAH or contusion. I would observe him, allow him to sober, and repeat CT if MS changes or neuro issues noted. D/C with concussion instructions, clear c-spine clinically when sober or with F/E plain films.

## 2015-01-15 NOTE — ED Notes (Signed)
Patient transported to CT 

## 2015-01-16 ENCOUNTER — Encounter (HOSPITAL_COMMUNITY): Payer: Self-pay

## 2015-01-16 ENCOUNTER — Other Ambulatory Visit (HOSPITAL_COMMUNITY): Payer: 59

## 2015-01-16 ENCOUNTER — Observation Stay (HOSPITAL_BASED_OUTPATIENT_CLINIC_OR_DEPARTMENT_OTHER): Payer: 59

## 2015-01-16 ENCOUNTER — Observation Stay (HOSPITAL_COMMUNITY): Payer: 59

## 2015-01-16 DIAGNOSIS — S066X0S Traumatic subarachnoid hemorrhage without loss of consciousness, sequela: Secondary | ICD-10-CM

## 2015-01-16 DIAGNOSIS — F10120 Alcohol abuse with intoxication, uncomplicated: Secondary | ICD-10-CM | POA: Diagnosis not present

## 2015-01-16 DIAGNOSIS — S066X9A Traumatic subarachnoid hemorrhage with loss of consciousness of unspecified duration, initial encounter: Secondary | ICD-10-CM | POA: Diagnosis not present

## 2015-01-16 DIAGNOSIS — R55 Syncope and collapse: Secondary | ICD-10-CM | POA: Diagnosis not present

## 2015-01-16 DIAGNOSIS — E119 Type 2 diabetes mellitus without complications: Secondary | ICD-10-CM | POA: Diagnosis not present

## 2015-01-16 DIAGNOSIS — F10129 Alcohol abuse with intoxication, unspecified: Secondary | ICD-10-CM | POA: Diagnosis not present

## 2015-01-16 DIAGNOSIS — I129 Hypertensive chronic kidney disease with stage 1 through stage 4 chronic kidney disease, or unspecified chronic kidney disease: Secondary | ICD-10-CM | POA: Diagnosis not present

## 2015-01-16 DIAGNOSIS — S0191XA Laceration without foreign body of unspecified part of head, initial encounter: Secondary | ICD-10-CM | POA: Diagnosis not present

## 2015-01-16 LAB — COMPREHENSIVE METABOLIC PANEL
ALK PHOS: 172 U/L — AB (ref 38–126)
ALT: 69 U/L — AB (ref 17–63)
ANION GAP: 11 (ref 5–15)
AST: 70 U/L — ABNORMAL HIGH (ref 15–41)
Albumin: 3.7 g/dL (ref 3.5–5.0)
BILIRUBIN TOTAL: 0.6 mg/dL (ref 0.3–1.2)
BUN: 9 mg/dL (ref 6–20)
CALCIUM: 9 mg/dL (ref 8.9–10.3)
CO2: 21 mmol/L — AB (ref 22–32)
CREATININE: 0.85 mg/dL (ref 0.61–1.24)
Chloride: 102 mmol/L (ref 101–111)
Glucose, Bld: 317 mg/dL — ABNORMAL HIGH (ref 65–99)
Potassium: 3.9 mmol/L (ref 3.5–5.1)
SODIUM: 134 mmol/L — AB (ref 135–145)
TOTAL PROTEIN: 7.1 g/dL (ref 6.5–8.1)

## 2015-01-16 LAB — GLUCOSE, CAPILLARY
GLUCOSE-CAPILLARY: 297 mg/dL — AB (ref 65–99)
Glucose-Capillary: 273 mg/dL — ABNORMAL HIGH (ref 65–99)

## 2015-01-16 LAB — CBC WITH DIFFERENTIAL/PLATELET
BASOS ABS: 0 10*3/uL (ref 0.0–0.1)
BASOS PCT: 0 %
EOS ABS: 0.1 10*3/uL (ref 0.0–0.7)
Eosinophils Relative: 1 %
HEMATOCRIT: 41.2 % (ref 39.0–52.0)
HEMOGLOBIN: 14.3 g/dL (ref 13.0–17.0)
Lymphocytes Relative: 38 %
Lymphs Abs: 2 10*3/uL (ref 0.7–4.0)
MCH: 32.9 pg (ref 26.0–34.0)
MCHC: 34.7 g/dL (ref 30.0–36.0)
MCV: 94.9 fL (ref 78.0–100.0)
MONOS PCT: 8 %
Monocytes Absolute: 0.4 10*3/uL (ref 0.1–1.0)
NEUTROS ABS: 2.7 10*3/uL (ref 1.7–7.7)
NEUTROS PCT: 53 %
Platelets: 117 10*3/uL — ABNORMAL LOW (ref 150–400)
RBC: 4.34 MIL/uL (ref 4.22–5.81)
RDW: 12.6 % (ref 11.5–15.5)
WBC: 5.2 10*3/uL (ref 4.0–10.5)

## 2015-01-16 LAB — CK: CK TOTAL: 179 U/L (ref 49–397)

## 2015-01-16 LAB — TROPONIN I

## 2015-01-16 MED ORDER — METFORMIN HCL 500 MG PO TABS: 500.0000 mg | ORAL_TABLET | Freq: Two times a day (BID) | ORAL | Status: DC

## 2015-01-16 MED ORDER — THIAMINE HCL 100 MG PO TABS: 100.0000 mg | ORAL_TABLET | Freq: Every day | ORAL | Status: DC

## 2015-01-16 MED ORDER — INSULIN GLARGINE 100 UNIT/ML ~~LOC~~ SOLN
5.0000 [IU] | Freq: Every day | SUBCUTANEOUS | Status: DC
  Administered 2015-01-16: 5 [IU] via SUBCUTANEOUS
  Filled 2015-01-16: qty 0.05

## 2015-01-16 MED ORDER — FOLIC ACID 1 MG PO TABS: 1.0000 mg | ORAL_TABLET | Freq: Every day | ORAL | Status: DC

## 2015-01-16 NOTE — Discharge Instructions (Signed)
Concussion, Adult  A concussion, or closed-head injury, is a brain injury caused by a direct blow to the head or by a quick and sudden movement (jolt) of the head or neck. Concussions are usually not life-threatening. Even so, the effects of a concussion can be serious. If you have had a concussion before, you are more likely to experience concussion-like symptoms after a direct blow to the head.   CAUSES  · Direct blow to the head, such as from running into another player during a soccer game, being hit in a fight, or hitting your head on a hard surface.  · A jolt of the head or neck that causes the brain to move back and forth inside the skull, such as in a car crash.  SIGNS AND SYMPTOMS  The signs of a concussion can be hard to notice. Early on, they may be missed by you, family members, and health care providers. You may look fine but act or feel differently.  Symptoms are usually temporary, but they may last for days, weeks, or even longer. Some symptoms may appear right away while others may not show up for hours or days. Every head injury is different. Symptoms include:  · Mild to moderate headaches that will not go away.  · A feeling of pressure inside your head.  · Having more trouble than usual:    Learning or remembering things you have heard.    Answering questions.    Paying attention or concentrating.    Organizing daily tasks.    Making decisions and solving problems.  · Slowness in thinking, acting or reacting, speaking, or reading.  · Getting lost or being easily confused.  · Feeling tired all the time or lacking energy (fatigued).  · Feeling drowsy.  · Sleep disturbances.    Sleeping more than usual.    Sleeping less than usual.    Trouble falling asleep.    Trouble sleeping (insomnia).  · Loss of balance or feeling lightheaded or dizzy.  · Nausea or vomiting.  · Numbness or tingling.  · Increased sensitivity to:    Sounds.    Lights.    Distractions.  · Vision problems or eyes that tire  easily.  · Diminished sense of taste or smell.  · Ringing in the ears.  · Mood changes such as feeling sad or anxious.  · Becoming easily irritated or angry for little or no reason.  · Lack of motivation.  · Seeing or hearing things other people do not see or hear (hallucinations).  DIAGNOSIS  Your health care provider can usually diagnose a concussion based on a description of your injury and symptoms. He or she will ask whether you passed out (lost consciousness) and whether you are having trouble remembering events that happened right before and during your injury.  Your evaluation might include:  · A brain scan to look for signs of injury to the brain. Even if the test shows no injury, you may still have a concussion.  · Blood tests to be sure other problems are not present.  TREATMENT  · Concussions are usually treated in an emergency department, in urgent care, or at a clinic. You may need to stay in the hospital overnight for further treatment.  · Tell your health care provider if you are taking any medicines, including prescription medicines, over-the-counter medicines, and natural remedies. Some medicines, such as blood thinners (anticoagulants) and aspirin, may increase the chance of complications. Also tell your health care   provider whether you have had alcohol or are taking illegal drugs. This information may affect treatment.  · Your health care provider will send you home with important instructions to follow.  · How fast you will recover from a concussion depends on many factors. These factors include how severe your concussion is, what part of your brain was injured, your age, and how healthy you were before the concussion.  · Most people with mild injuries recover fully. Recovery can take time. In general, recovery is slower in older persons. Also, persons who have had a concussion in the past or have other medical problems may find that it takes longer to recover from their current injury.  HOME  CARE INSTRUCTIONS  General Instructions  · Carefully follow the directions your health care provider gave you.  · Only take over-the-counter or prescription medicines for pain, discomfort, or fever as directed by your health care provider.  · Take only those medicines that your health care provider has approved.  · Do not drink alcohol until your health care provider says you are well enough to do so. Alcohol and certain other drugs may slow your recovery and can put you at risk of further injury.  · If it is harder than usual to remember things, write them down.  · If you are easily distracted, try to do one thing at a time. For example, do not try to watch TV while fixing dinner.  · Talk with family members or close friends when making important decisions.  · Keep all follow-up appointments. Repeated evaluation of your symptoms is recommended for your recovery.  · Watch your symptoms and tell others to do the same. Complications sometimes occur after a concussion. Older adults with a brain injury may have a higher risk of serious complications, such as a blood clot on the brain.  · Tell your teachers, school nurse, school counselor, coach, athletic trainer, or work manager about your injury, symptoms, and restrictions. Tell them about what you can or cannot do. They should watch for:    Increased problems with attention or concentration.    Increased difficulty remembering or learning new information.    Increased time needed to complete tasks or assignments.    Increased irritability or decreased ability to cope with stress.    Increased symptoms.  · Rest. Rest helps the brain to heal. Make sure you:    Get plenty of sleep at night. Avoid staying up late at night.    Keep the same bedtime hours on weekends and weekdays.    Rest during the day. Take daytime naps or rest breaks when you feel tired.  · Limit activities that require a lot of thought or concentration. These include:    Doing homework or job-related  work.    Watching TV.    Working on the computer.  · Avoid any situation where there is potential for another head injury (football, hockey, soccer, basketball, martial arts, downhill snow sports and horseback riding). Your condition will get worse every time you experience a concussion. You should avoid these activities until you are evaluated by the appropriate follow-up health care providers.  Returning To Your Regular Activities  You will need to return to your normal activities slowly, not all at once. You must give your body and brain enough time for recovery.  · Do not return to sports or other athletic activities until your health care provider tells you it is safe to do so.  · Ask   your health care provider when you can drive, ride a bicycle, or operate heavy machinery. Your ability to react may be slower after a brain injury. Never do these activities if you are dizzy.  · Ask your health care provider about when you can return to work or school.  Preventing Another Concussion  It is very important to avoid another brain injury, especially before you have recovered. In rare cases, another injury can lead to permanent brain damage, brain swelling, or death. The risk of this is greatest during the first 7-10 days after a head injury. Avoid injuries by:  · Wearing a seat belt when riding in a car.  · Drinking alcohol only in moderation.  · Wearing a helmet when biking, skiing, skateboarding, skating, or doing similar activities.  · Avoiding activities that could lead to a second concussion, such as contact or recreational sports, until your health care provider says it is okay.  · Taking safety measures in your home.    Remove clutter and tripping hazards from floors and stairways.    Use grab bars in bathrooms and handrails by stairs.    Place non-slip mats on floors and in bathtubs.    Improve lighting in dim areas.  SEEK MEDICAL CARE IF:  · You have increased problems paying attention or  concentrating.  · You have increased difficulty remembering or learning new information.  · You need more time to complete tasks or assignments than before.  · You have increased irritability or decreased ability to cope with stress.  · You have more symptoms than before.  Seek medical care if you have any of the following symptoms for more than 2 weeks after your injury:  · Lasting (chronic) headaches.  · Dizziness or balance problems.  · Nausea.  · Vision problems.  · Increased sensitivity to noise or light.  · Depression or mood swings.  · Anxiety or irritability.  · Memory problems.  · Difficulty concentrating or paying attention.  · Sleep problems.  · Feeling tired all the time.  SEEK IMMEDIATE MEDICAL CARE IF:  · You have severe or worsening headaches. These may be a sign of a blood clot in the brain.  · You have weakness (even if only in one hand, leg, or part of the face).  · You have numbness.  · You have decreased coordination.  · You vomit repeatedly.  · You have increased sleepiness.  · One pupil is larger than the other.  · You have convulsions.  · You have slurred speech.  · You have increased confusion. This may be a sign of a blood clot in the brain.  · You have increased restlessness, agitation, or irritability.  · You are unable to recognize people or places.  · You have neck pain.  · It is difficult to wake you up.  · You have unusual behavior changes.  · You lose consciousness.  MAKE SURE YOU:  · Understand these instructions.  · Will watch your condition.  · Will get help right away if you are not doing well or get worse.     This information is not intended to replace advice given to you by your health care provider. Make sure you discuss any questions you have with your health care provider.     Document Released: 05/29/2003 Document Revised: 03/29/2014 Document Reviewed: 09/28/2012  [employer]vier Interactive Patient Education ©2016 [employer]

## 2015-01-16 NOTE — Progress Notes (Signed)
Echocardiogram 2D Echocardiogram has been performed.  01/16/2015 11:45 AM Gertie FeyMichelle Dartanion Teo, RVT, RDCS, RDMS

## 2015-01-16 NOTE — Progress Notes (Signed)
Reviewed discharge information with patient and caregiver. Answered all questions. Patient/caregiver able to teach back medications and reasons to contact MD/911. Patient verbalizes importance of PCP follow up appointment. Patient verbalizes importance of taking metformin for diabetes Earnest ConroyBrooke M. Clelia CroftShaw, RN
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
## 2015-01-17 LAB — HEMOGLOBIN A1C
HEMOGLOBIN A1C: 11.7 % — AB (ref 4.8–5.6)
Mean Plasma Glucose: 289 mg/dL

## 2015-02-10 ENCOUNTER — Ambulatory Visit: Payer: 59 | Admitting: Family Medicine

## 2016-07-17 ENCOUNTER — Emergency Department (HOSPITAL_COMMUNITY): Payer: Self-pay

## 2016-07-17 ENCOUNTER — Encounter (HOSPITAL_COMMUNITY): Payer: Self-pay | Admitting: Radiology

## 2016-07-17 ENCOUNTER — Inpatient Hospital Stay (HOSPITAL_COMMUNITY)
Admission: EM | Admit: 2016-07-17 | Discharge: 2016-07-18 | DRG: 086 | Disposition: A | Discharge status: Home / Self Care | Payer: Self-pay | Attending: Family Medicine | Admitting: Family Medicine

## 2016-07-17 DIAGNOSIS — Y92512 Supermarket, store or market as the place of occurrence of the external cause: Secondary | ICD-10-CM

## 2016-07-17 DIAGNOSIS — E785 Hyperlipidemia, unspecified: Secondary | ICD-10-CM | POA: Diagnosis present

## 2016-07-17 DIAGNOSIS — N183 Chronic kidney disease, stage 3 (moderate): Secondary | ICD-10-CM | POA: Diagnosis present

## 2016-07-17 DIAGNOSIS — N179 Acute kidney failure, unspecified: Secondary | ICD-10-CM | POA: Diagnosis present

## 2016-07-17 DIAGNOSIS — E860 Dehydration: Secondary | ICD-10-CM | POA: Diagnosis present

## 2016-07-17 DIAGNOSIS — F10921 Alcohol use, unspecified with intoxication delirium: Secondary | ICD-10-CM

## 2016-07-17 DIAGNOSIS — Z79899 Other long term (current) drug therapy: Secondary | ICD-10-CM

## 2016-07-17 DIAGNOSIS — F10121 Alcohol abuse with intoxication delirium: Secondary | ICD-10-CM | POA: Diagnosis present

## 2016-07-17 DIAGNOSIS — E1122 Type 2 diabetes mellitus with diabetic chronic kidney disease: Secondary | ICD-10-CM | POA: Diagnosis present

## 2016-07-17 DIAGNOSIS — E669 Obesity, unspecified: Secondary | ICD-10-CM | POA: Diagnosis present

## 2016-07-17 DIAGNOSIS — Y9301 Activity, walking, marching and hiking: Secondary | ICD-10-CM | POA: Diagnosis present

## 2016-07-17 DIAGNOSIS — W19XXXA Unspecified fall, initial encounter: Secondary | ICD-10-CM | POA: Diagnosis present

## 2016-07-17 DIAGNOSIS — I129 Hypertensive chronic kidney disease with stage 1 through stage 4 chronic kidney disease, or unspecified chronic kidney disease: Secondary | ICD-10-CM | POA: Diagnosis present

## 2016-07-17 DIAGNOSIS — S066X9A Traumatic subarachnoid hemorrhage with loss of consciousness of unspecified duration, initial encounter: Secondary | ICD-10-CM | POA: Diagnosis present

## 2016-07-17 DIAGNOSIS — I609 Nontraumatic subarachnoid hemorrhage, unspecified: Secondary | ICD-10-CM

## 2016-07-17 DIAGNOSIS — Y908 Blood alcohol level of 240 mg/100 ml or more: Secondary | ICD-10-CM | POA: Diagnosis present

## 2016-07-17 DIAGNOSIS — Z6831 Body mass index (BMI) 31.0-31.9, adult: Secondary | ICD-10-CM

## 2016-07-17 DIAGNOSIS — Z7984 Long term (current) use of oral hypoglycemic drugs: Secondary | ICD-10-CM

## 2016-07-17 DIAGNOSIS — Z833 Family history of diabetes mellitus: Secondary | ICD-10-CM

## 2016-07-17 DIAGNOSIS — R296 Repeated falls: Secondary | ICD-10-CM

## 2016-07-17 DIAGNOSIS — G9340 Encephalopathy, unspecified: Secondary | ICD-10-CM

## 2016-07-17 DIAGNOSIS — S0083XA Contusion of other part of head, initial encounter: Secondary | ICD-10-CM | POA: Diagnosis present

## 2016-07-17 DIAGNOSIS — S066X1A Traumatic subarachnoid hemorrhage with loss of consciousness of 30 minutes or less, initial encounter: Principal | ICD-10-CM | POA: Diagnosis present

## 2016-07-17 DIAGNOSIS — Z7722 Contact with and (suspected) exposure to environmental tobacco smoke (acute) (chronic): Secondary | ICD-10-CM

## 2016-07-17 LAB — TSH: TSH: 1.383 u[IU]/mL (ref 0.350–4.500)

## 2016-07-17 LAB — ETHANOL: ALCOHOL ETHYL (B): 410 mg/dL — AB (ref <5)

## 2016-07-17 LAB — CBC
HEMATOCRIT: 44.8 % (ref 39.0–52.0)
Hemoglobin: 15.9 g/dL (ref 13.0–17.0)
MCH: 34.6 pg — ABNORMAL HIGH (ref 26.0–34.0)
MCHC: 35.5 g/dL (ref 30.0–36.0)
MCV: 97.4 fL (ref 78.0–100.0)
PLATELETS: 193 10*3/uL (ref 150–400)
RBC: 4.6 MIL/uL (ref 4.22–5.81)
RDW: 13.5 % (ref 11.5–15.5)
WBC: 7.2 10*3/uL (ref 4.0–10.5)

## 2016-07-17 LAB — DIFFERENTIAL
BASOS ABS: 0 10*3/uL (ref 0.0–0.1)
BASOS PCT: 0 %
EOS ABS: 0.2 10*3/uL (ref 0.0–0.7)
Eosinophils Relative: 2 %
Lymphocytes Relative: 51 %
Lymphs Abs: 3.7 10*3/uL (ref 0.7–4.0)
MONOS PCT: 10 %
Monocytes Absolute: 0.7 10*3/uL (ref 0.1–1.0)
NEUTROS PCT: 37 %
Neutro Abs: 2.7 10*3/uL (ref 1.7–7.7)

## 2016-07-17 LAB — COMPREHENSIVE METABOLIC PANEL
ALT: 58 U/L (ref 17–63)
AST: 125 U/L — AB (ref 15–41)
Albumin: 3.8 g/dL (ref 3.5–5.0)
Alkaline Phosphatase: 165 U/L — ABNORMAL HIGH (ref 38–126)
Anion gap: 11 (ref 5–15)
BUN: 9 mg/dL (ref 6–20)
CHLORIDE: 105 mmol/L (ref 101–111)
CO2: 22 mmol/L (ref 22–32)
Calcium: 8.7 mg/dL — ABNORMAL LOW (ref 8.9–10.3)
Creatinine, Ser: 1 mg/dL (ref 0.61–1.24)
GFR calc Af Amer: >60 mL/min (ref >60)
Glucose, Bld: 179 mg/dL — ABNORMAL HIGH (ref 65–99)
Potassium: 3.9 mmol/L (ref 3.5–5.1)
SODIUM: 138 mmol/L (ref 135–145)
Total Bilirubin: 0.9 mg/dL (ref 0.3–1.2)
Total Protein: 7.6 g/dL (ref 6.5–8.1)

## 2016-07-17 LAB — RAPID URINE DRUG SCREEN, HOSP PERFORMED
AMPHETAMINES: NOT DETECTED
Barbiturates: NOT DETECTED
Benzodiazepines: NOT DETECTED
COCAINE: NOT DETECTED
OPIATES: NOT DETECTED
TETRAHYDROCANNABINOL: NOT DETECTED

## 2016-07-17 LAB — I-STAT CHEM 8, ED
BUN: 9 mg/dL (ref 6–20)
Calcium, Ion: 1.03 mmol/L — ABNORMAL LOW (ref 1.15–1.40)
Chloride: 104 mmol/L (ref 101–111)
Creatinine, Ser: 1.4 mg/dL — ABNORMAL HIGH (ref 0.61–1.24)
Glucose, Bld: 186 mg/dL — ABNORMAL HIGH (ref 65–99)
HEMATOCRIT: 49 % (ref 39.0–52.0)
HEMOGLOBIN: 16.7 g/dL (ref 13.0–17.0)
POTASSIUM: 3.9 mmol/L (ref 3.5–5.1)
SODIUM: 140 mmol/L (ref 135–145)
TCO2: 23 mmol/L (ref 0–100)

## 2016-07-17 LAB — GLUCOSE, CAPILLARY: Glucose-Capillary: 136 mg/dL — ABNORMAL HIGH (ref 65–99)

## 2016-07-17 LAB — I-STAT TROPONIN, ED: Troponin i, poc: 0 ng/mL (ref 0.00–0.08)

## 2016-07-17 LAB — PROTIME-INR
INR: 1.06
PROTHROMBIN TIME: 13.8 s (ref 11.4–15.2)

## 2016-07-17 LAB — APTT: APTT: 32 s (ref 24–36)

## 2016-07-17 LAB — TROPONIN I

## 2016-07-17 MED ORDER — LORAZEPAM 1 MG PO TABS
1.0000 mg | ORAL_TABLET | Freq: Four times a day (QID) | ORAL | Status: DC | PRN
  Administered 2016-07-18: 1 mg via ORAL
  Filled 2016-07-17: qty 1

## 2016-07-17 MED ORDER — FOLIC ACID 1 MG PO TABS
1.0000 mg | ORAL_TABLET | Freq: Every day | ORAL | Status: DC
  Administered 2016-07-17 – 2016-07-18 (×2): 1 mg via ORAL
  Filled 2016-07-17 (×2): qty 1

## 2016-07-17 MED ORDER — THIAMINE HCL 100 MG/ML IJ SOLN
100.0000 mg | Freq: Every day | INTRAMUSCULAR | Status: DC
  Administered 2016-07-17: 100 mg via INTRAVENOUS
  Filled 2016-07-17 (×2): qty 2

## 2016-07-17 MED ORDER — IOPAMIDOL (ISOVUE-370) INJECTION 76%
INTRAVENOUS | Status: AC
  Administered 2016-07-17: 50 mL
  Filled 2016-07-17: qty 50

## 2016-07-17 MED ORDER — ADULT MULTIVITAMIN W/MINERALS CH
1.0000 | ORAL_TABLET | Freq: Every day | ORAL | Status: DC
  Administered 2016-07-17 – 2016-07-18 (×2): 1 via ORAL
  Filled 2016-07-17 (×2): qty 1

## 2016-07-17 MED ORDER — INSULIN ASPART 100 UNIT/ML ~~LOC~~ SOLN
0.0000 [IU] | Freq: Three times a day (TID) | SUBCUTANEOUS | Status: DC
  Administered 2016-07-18: 2 [IU] via SUBCUTANEOUS
  Administered 2016-07-18: 3 [IU] via SUBCUTANEOUS

## 2016-07-17 MED ORDER — LORAZEPAM 2 MG/ML IJ SOLN
1.0000 mg | Freq: Four times a day (QID) | INTRAMUSCULAR | Status: DC | PRN
  Administered 2016-07-17: 1 mg via INTRAVENOUS
  Filled 2016-07-17: qty 1

## 2016-07-17 MED ORDER — SODIUM CHLORIDE 0.9 % IV SOLN
500.0000 mg | Freq: Two times a day (BID) | INTRAVENOUS | Status: DC
  Administered 2016-07-17 – 2016-07-18 (×3): 500 mg via INTRAVENOUS
  Filled 2016-07-17 (×5): qty 5

## 2016-07-17 MED ORDER — SODIUM CHLORIDE 0.9% FLUSH
3.0000 mL | Freq: Two times a day (BID) | INTRAVENOUS | Status: DC
  Administered 2016-07-17 – 2016-07-18 (×2): 3 mL via INTRAVENOUS

## 2016-07-17 MED ORDER — VITAMIN B-1 100 MG PO TABS
100.0000 mg | ORAL_TABLET | Freq: Every day | ORAL | Status: DC
  Administered 2016-07-18: 100 mg via ORAL
  Filled 2016-07-17: qty 1

## 2016-07-17 NOTE — Consult Note (Signed)
CC:  Chief Complaint  Patient presents with  . Code Stroke    HPI: [name] is a 51 y.o. male who was brought to ER via EMS. Pt is intoxicated and unable to provide history so history is gathered using chart review. He went to [facility] and had a sudden change in mental status and fell backwards striking head. After he regained consciousness, he was noted to weakness of the left side. Symptoms resolved upon arrival to ER. States he was having a "good time" prior to going to [place] but will not elaborate on details. Amnesia with events surrounding the fall. States he feels well. He is questioning why he is here and believes everyone is over reacting.  Denies focal weakness, HA, dizziness.  PMH: Past Medical History:  Diagnosis Date  . 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 Chronic kidney disease, stage III (moderate)    Deterding  . Hypertension   . Obesity (BMI 30-39.9)   . Other and unspecified hyperlipidemia   . Type II or unspecified type diabetes mellitus without mention of complication, not stated as uncontrolled     PSH: Past Surgical History:  Procedure Laterality Date  . stomach vessel aneurysm     Patient reported     SH: Social History  Substance Use Topics  . Smoking status: Passive Smoke Exposure - Never Smoker  . Smokeless tobacco: Never Used  . Alcohol use 7.2 oz/week    12 Cans of beer per week    MEDS: Prior to Admission medications   Medication Sig Start Date End Date Taking? Authorizing Provider  folic acid (FOLVITE) 1 MG tablet Take 1 tablet (1 mg total) by mouth daily. 01/16/15   Alison Murray, MD  lisinopril-hydrochlorothiazide (PRINZIDE,ZESTORETIC) 20-25 MG tablet Take 1 tablet by mouth daily.    Historical Provider, MD  metFORMIN (GLUCOPHAGE) 500 MG tablet Take 1 tablet (500 mg total) by mouth 2 (two) times daily with a meal. 01/16/15   Alison Murray, MD  simvastatin (ZOCOR) 40 MG tablet Take 40 mg by mouth daily.      Historical  Provider, MD  thiamine 100 MG tablet Take 1 tablet (100 mg total) by mouth daily. 01/16/15   Alison Murray, MD  WELCHOL 625 MG tablet Take 1,875 mg by mouth 2 (two) times daily.  10/28/14   Historical Provider, MD    ALLERGY: No Known Allergies  ROS: Review of Systems  Constitutional: Negative.   HENT: Negative.   Eyes: Negative.   Respiratory: Negative.   Cardiovascular: Negative.   Gastrointestinal: Negative.   Musculoskeletal: Negative.   Skin: Negative.   Neurological: Negative for dizziness, tingling, tremors, sensory change, speech change, focal weakness, seizures, loss of consciousness and headaches.    Vitals:   07/17/16 1445 07/17/16 1515  BP: 116/80 110/79  Pulse: 76 83  Resp: 17 11  Temp:     General appearance: WDWN, NAD Eyes:  Left pupil larger than Right pupil but both reactive, Fundoscopic: normal Cardiovascular: Regular rate and rhythm without murmurs, rubs, gallops. No edema or variciosities. Distal pulses normal. Pulmonary: Clear to auscultation Musculoskeletal:     Muscle tone upper extremities: Normal    Muscle tone lower extremities: Normal    Motor exam: Upper Extremities Deltoid Bicep Tricep Grip  Right 5/5 5/5 5/5 5/5  Left 5/5 5/5 5/5 5/5   Lower Extremity IP Quad PF DF EHL  Right 5/5 5/5 5/5 5/5 5/5  Left 5/5 5/5 5/5 5/5  5/5   Neurological Awake, alert, oriented x4 Memory and concentration grossly intact Speech fluent, appropriate CNII: Visual fields normal CNIII/IV/VI: EOMI CNV: Facial sensation normal CNVII: Symmetric, normal strength CNVIII: Grossly normal CNIX: Normal palate movement CNXI: Trap and SCM strength normal CN XII: Tongue protrusion normal Sensation grossly intact to LT DTR: Normal  IMAGING: CT HEAD IMPRESSION: 1. Small volume subarachnoid hemorrhage in the interhemispheric fissure, likely posttraumatic.No skull fracture, but acute LEFT parietal scalp hematoma. Advanced cerebral and cerebellar  atrophy.  IMPRESSION/PLAN - 51 y.o. male with small traumatic SAH from fall secondary to intoxication. He is neurologically intact with the exception of amnesia surrounding the event.  No surgical intervention is indicated at this time based on small volume of SAH. He is going to be admitted under hospitalist service for intoxication. Will start on Keppra  BID x7 days for seizure prophylaxis. Monitor neuro exam q 1-2 hours. Repeat CT in 12 hours, sooner as needed for worsening exam. Will continue to follow. Call for any concerns.

## 2016-07-17 NOTE — Consult Note (Signed)
Neurology Consultation Reason for Consult: Altered mental status Referring Physician: Rosalia Hammers, D  CC: Altered mental status  History is obtained from: Patient  HPI: [name] is a 51 y.o. male who was walking around at [facility] today when he is seemed to lose his balance stumble and fall. He was unconscious for about 2 minutes and then when he regained consciousness was confused. He was transported via ambulance and a code stroke was called out of concern for weakness.  On arrival, his exam was nonfocal , but he was quite confused. He also spell of alcohol.   LKW: Unclear tpa given?: no, acute tiny subarachnoid   ROS:  Unable to obtain due to altered mental status.   Past Medical History:  Diagnosis Date  . Abdominal aneurysm [facility])    Patient reported   . Chronic kidney disease, stage III (moderate)    Deterding  . Hypertension   . Obesity (BMI 30-39.9)   . Other and unspecified hyperlipidemia   . Type II or unspecified type diabetes mellitus without mention of complication, not stated as uncontrolled      Family History  Problem Relation Age of Onset  . Diabetes Mellitus II Mother      Social History:  reports that he is a non-smoker but has been exposed to tobacco smoke. He has never used smokeless tobacco. He reports that he drinks about 7.2 oz of alcohol per week . He reports that he does not use drugs.   Exam: Current vital signs: BP 110/79   Pulse 83   Temp 97.6 F (36.4 C) (Oral)   Resp 11   Wt 105.1 kg (231 lb 11.3 oz)   SpO2 98%   BMI 31.42 kg/m  Vital signs in last 24 hours: Temp:  [97.6 F (36.4 C)] 97.6 F (36.4 C) (04/28 1331) Pulse Rate:  [76-86] 83 (04/28 1515) Resp:  [11-22] 11 (04/28 1515) BP: (110-116)/(70-80) 110/79 (04/28 1515) SpO2:  [93 %-98 %] 98 % (04/28 1515) Weight:  [105.1 kg (231 lb 11.3 oz)] 105.1 kg (231 lb 11.3 oz) (04/28 1333)   Physical Exam  Constitutional: Appears well-developed and well-nourished.  Psych: Affect  appropriate to situation Eyes: He has a prosthetic right eye HENT: No OP obstrucion Head: Normocephalic.  Cardiovascular: Normal rate and regular rhythm.  Respiratory: Effort normal and breath sounds normal to anterior ascultation GI: Soft.  No distension. There is no tenderness.  Skin: WDI  Neuro: Mental Status: Patient is confused and lethargic. He keeps saying things such as "how are you doing buddy" and "I love you" Cranial Nerves: II: He appears to blink to threat bilaterally left eye is reactive and 6 mm, right eye is prosthetic III,IV, VI: He fixates and tracks across midline both directions V: Facial sensation is symmetric to temperature VII: Facial movement is symmetric.  VIII: hearing is intact to voice X: Uvula elevates symmetrically XI: Shoulder shrug is symmetric. XII: tongue is midline without atrophy or fasciculations.  Motor: He moves all extremity spontaneously, he does let them fall, but then will move it spontaneously, no clear lateralizing features Sensory: He does respond to noxious stimulation in all 4 extremities Cerebellar: No clear ataxia  I have reviewed labs in epic and the results pertinent to this consultation are: CMP-AST elevation, approximately double ALT  I have reviewed the images obtained: CT head-likely posttraumatic tiny hemorrhage  Impression: 51 year old male presenting with inebriation and tiny interhemispheric subarachnoid.  Recommendations: 1) allow patient to sober up, if he continues to  be inpatient past tomorrow, would start ciwa protocol 2) repeat CT scan in the morning   Ritta Slot, MD [facility] [phone]  If 7pm- 7am, please page neurology on call as listed in AMION.

## 2016-07-17 NOTE — ED Triage Notes (Signed)
Pt BIB [facility] from [facility] where pt was seen going from standing to grabbing a shelf, wobbling, and having a syncopal episode. Pt struck back of head and had LOC for 2-3 min per bystanders. EMS reports slurred speech, and left sided weakness.
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
## 2016-07-17 NOTE — ED Notes (Signed)
ED Provider at bedside. 

## 2016-07-17 NOTE — ED Notes (Signed)
Reoriented pt to surroundings. Pt thought he was at home.

## 2016-07-17 NOTE — Progress Notes (Signed)
Code stroke called at 1242, Patient arrived to [facility] ED via [facility] at 1253.  As per EMS was in [facility] LSN 1220, he was witnessed to go into [employer] normal and then was noted to be unsteady on his feet and had a syncopal episode and had 2-3 minutes of LOC and when he came to noted to have slurred speech and left side weakness.  NIHSS 8, with poor effort on exam

## 2016-07-17 NOTE — Progress Notes (Signed)
Pt stated that he needs his wallet. Only his clothes and shoes in room. No wallet noted. Pt alert and oriented to person, place, and time, not situation. Reported to security.
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
## 2016-07-18 ENCOUNTER — Inpatient Hospital Stay (HOSPITAL_COMMUNITY): Payer: Self-pay

## 2016-07-18 DIAGNOSIS — G9340 Encephalopathy, unspecified: Secondary | ICD-10-CM

## 2016-07-18 DIAGNOSIS — S066X9A Traumatic subarachnoid hemorrhage with loss of consciousness of unspecified duration, initial encounter: Secondary | ICD-10-CM

## 2016-07-18 DIAGNOSIS — F10921 Alcohol use, unspecified with intoxication delirium: Secondary | ICD-10-CM

## 2016-07-18 DIAGNOSIS — R296 Repeated falls: Secondary | ICD-10-CM

## 2016-07-18 DIAGNOSIS — W19XXXA Unspecified fall, initial encounter: Secondary | ICD-10-CM

## 2016-07-18 LAB — CBC
HCT: 43.2 % (ref 39.0–52.0)
Hemoglobin: 14.7 g/dL (ref 13.0–17.0)
MCH: 33.2 pg (ref 26.0–34.0)
MCHC: 34 g/dL (ref 30.0–36.0)
MCV: 97.5 fL (ref 78.0–100.0)
Platelets: 120 10*3/uL — ABNORMAL LOW (ref 150–400)
RBC: 4.43 MIL/uL (ref 4.22–5.81)
RDW: 13.1 % (ref 11.5–15.5)
WBC: 5.2 10*3/uL (ref 4.0–10.5)

## 2016-07-18 LAB — COMPREHENSIVE METABOLIC PANEL
ALT: 53 U/L (ref 17–63)
ANION GAP: 12 (ref 5–15)
AST: 94 U/L — ABNORMAL HIGH (ref 15–41)
Albumin: 3.5 g/dL (ref 3.5–5.0)
Alkaline Phosphatase: 139 U/L — ABNORMAL HIGH (ref 38–126)
BUN: 9 mg/dL (ref 6–20)
CO2: 21 mmol/L — AB (ref 22–32)
CREATININE: 0.83 mg/dL (ref 0.61–1.24)
Calcium: 9.1 mg/dL (ref 8.9–10.3)
Chloride: 105 mmol/L (ref 101–111)
Glucose, Bld: 246 mg/dL — ABNORMAL HIGH (ref 65–99)
POTASSIUM: 4 mmol/L (ref 3.5–5.1)
SODIUM: 138 mmol/L (ref 135–145)
Total Bilirubin: 0.9 mg/dL (ref 0.3–1.2)
Total Protein: 7.3 g/dL (ref 6.5–8.1)

## 2016-07-18 LAB — GLUCOSE, CAPILLARY
Glucose-Capillary: 229 mg/dL — ABNORMAL HIGH (ref 65–99)
Glucose-Capillary: 147 mg/dL — ABNORMAL HIGH (ref 65–99)

## 2016-07-18 LAB — LIPID PANEL
CHOL/HDL RATIO: 6.5 ratio
CHOLESTEROL: 275 mg/dL — AB (ref 0–200)
HDL: 42 mg/dL (ref >40)
LDL CALC: 159 mg/dL — AB (ref 0–99)
Triglycerides: 372 mg/dL — ABNORMAL HIGH (ref <150)
VLDL: 74 mg/dL — AB (ref 0–40)

## 2016-07-18 LAB — HEMOGLOBIN A1C
HEMOGLOBIN A1C: 7.3 % — AB (ref 4.8–5.6)
MEAN PLASMA GLUCOSE: 163 mg/dL

## 2016-07-18 LAB — HIV ANTIBODY (ROUTINE TESTING W REFLEX): HIV Screen 4th Generation wRfx: NONREACTIVE

## 2016-07-18 MED ORDER — LEVETIRACETAM 500 MG PO TABS: 500.0000 mg | ORAL_TABLET | Freq: Two times a day (BID) | ORAL | 0 refills | Status: DC

## 2016-07-18 MED ORDER — HYDROCHLOROTHIAZIDE 25 MG PO TABS
25.0000 mg | ORAL_TABLET | Freq: Every day | ORAL | Status: DC
  Administered 2016-07-18: 25 mg via ORAL
  Filled 2016-07-18: qty 1

## 2016-07-18 MED ORDER — LISINOPRIL-HYDROCHLOROTHIAZIDE 20-25 MG PO TABS: 1.0000 | ORAL_TABLET | Freq: Every day | ORAL | Status: DC

## 2016-07-18 MED ORDER — LISINOPRIL 20 MG PO TABS
20.0000 mg | ORAL_TABLET | Freq: Every day | ORAL | Status: DC
  Administered 2016-07-18: 20 mg via ORAL
  Filled 2016-07-18: qty 1

## 2016-07-18 NOTE — Progress Notes (Signed)
Pt was going to CT scan at 0430.

## 2016-07-18 NOTE — Progress Notes (Addendum)
Received discharge order. Previous nurse Darl Pikes, RN) returned his wallet and pt stated he got all his stuff in it. Discharge summary discussed and pt stated to pick up his Keppra at pharmacy. Wife at bedside. Pt wanted note for work and MD made aware.

## 2016-07-18 NOTE — Evaluation (Signed)
Physical Therapy Evaluation Patient Details Name: [name] MRN: [id number] DOB: 1965/10/08 Today's Date: 07/18/2016   History of Present Illness  Theadore Blunck is a 51 y.o. male presenting with head trauma s/p fall. PMH is significant for HTN, obesity, T2DM, CKD, AAA.  CT scan + for small SAH.  Clinical Impression  Patient presents close to functional baseline.  Some concern for judgement impairment and STM loss.  However, currently independent with mobility and no follow up PT needs at this time.     Follow Up Recommendations No PT follow up    Equipment Recommendations  None recommended by PT    Recommendations for Other Services       Precautions / Restrictions Precautions Precautions: None      Mobility  Bed Mobility Overal bed mobility: Independent                Transfers Overall transfer level: Independent                  Ambulation/Gait Ambulation/Gait assistance: Independent Ambulation Distance (Feet): 250 Feet Assistive device: None Gait Pattern/deviations: WFL(Within Functional Limits)        Stairs Stairs: Yes Stairs assistance: Modified independent (Device/Increase time) Stair Management: One rail Right Number of Stairs: 10 General stair comments: initially two stairs at a time despite cues for normal pattern  Wheelchair Mobility    Modified Rankin (Stroke Patients Only)       Balance Overall balance assessment: Independent                               Standardized Balance Assessment Standardized Balance Assessment : Dynamic Gait Index   Dynamic Gait Index Level Surface: Normal Change in Gait Speed: Normal Gait with Horizontal Head Turns: Normal Gait with Vertical Head Turns: Normal Gait and Pivot Turn: Normal Step Over Obstacle: Normal Step Around Obstacles: Normal Steps: Mild Impairment Total Score: 23       Pertinent Vitals/Pain Pain Assessment: No/denies pain    Home Living  Family/patient expects to be discharged to:: Private residence Living Arrangements: Spouse/significant other;Children Available Help at Discharge: Family Type of Home: House Home Access: Stairs to enter     Home Layout: One level Home Equipment: None      Prior Function Level of Independence: Independent         Comments: Location manager Dominance   Dominant Hand: Right    Extremity/Trunk Assessment        Lower Extremity Assessment Lower Extremity Assessment: Overall WFL for tasks assessed       Communication   Communication: No difficulties  Cognition Arousal/Alertness: Awake/alert Behavior During Therapy: Restless Overall Cognitive Status: Within Functional Limits for tasks assessed                                 General Comments: Overall WFL, but patient indignant that I would have to test his mobility; relates his work activities climbing up on ladder, etc;  does not remember falling      General Comments General comments (skin integrity, edema, etc.): R eye with 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)    Exercises     Assessment/Plan    PT Assessment Patent does not need any further PT services  PT Problem List         PT Treatment Interventions      PT  Goals (Current goals can be found in the Care Plan section)  Acute Rehab PT Goals PT Goal Formulation: All assessment and education complete, DC therapy    Frequency     Barriers to discharge        Co-evaluation               End of Session   Activity Tolerance: Patient tolerated treatment well Patient left: in bed   PT Visit Diagnosis: History of falling (Z91.81)    Time: 0981-1914 PT Time Calculation (min) (ACUTE ONLY): 17 min   Charges:   PT Evaluation $PT Eval Low Complexity: 1 Procedure     PT G Codes:   PT G-Codes **NOT FOR INPATIENT CLASS** Functional Assessment Tool Used: AM-PAC 6 Clicks Basic Mobility Functional Limitation: Mobility: Walking and  moving around Mobility: Walking and Moving Around Current Status (N8295): 0 percent impaired, limited or restricted Mobility: Walking and Moving Around Goal Status (A2130): 0 percent impaired, limited or restricted Mobility: Walking and Moving Around Discharge Status (Q6578): 0 percent impaired, limited or restricted    [place], [place] [phone] 07/18/2016   Elray Mcgregor 07/18/2016, 1:23 PM

## 2016-07-18 NOTE — Progress Notes (Signed)
Family Medicine Teaching Service Daily Progress Note Intern Pager: [phone]  Patient name: [name] Medical record number: [id number] Date of birth: 1966/03/21 Age: 51 y.o. Gender: male  Primary Care Provider: Trevor Iha, MD Consultants: Neurosurgery, Neurology Code Status: Full - per admitting team but patient was confused and so will have to ask again when patient AAOx3  Pt Overview and Major Events to Date:  4/28 Admit to FPTS  Assessment and Plan: [name] is a 51 y.o. male presenting with head trauma s/p fall. PMH is significant for HTN, obesity, T2DM, CKD, AAA  Subarachnoid hemorrhage s/p fall. Patient had witnessed fall, hit head, with LOC for 2-87min. Found to have small subarachnoid hemorrhage and acute L parietal scalp hematoma on CT head. No  injuries on CT C spine.  - Per Neurosurgery, Keppra  BID x7 days for seizure prophylaxis.  - Repeat CT this AM to monitor bleed - Neuro checks q4hours - monitor BP - f/u neurosurg recs  ?Syncope prior to fall. Likely 2/2 EtOH intoxication since no neuro deficits on admission. Initial concern for stroke given reports of slurred speech and L sided weakness by EMS, however pt cleared by neurology after code stroke was called.  - Troponin x1 - negative - Echo - am EKG - Risk strat labs: TSH (WNL), a1c, lipid panel (chol 275, Trig 372, LDL 159) - monitor on telemetry  EtOH Abuse. Intoxicated on admission with EtOH level 410. Per patient last drink was this am, drinks at least 3-5 unspecified alcoholic beverages daily. Denied h/o EtOH withdrawal but is unreliable historian. UDS neg. Mentation improved and AAOx3 this AM. - CIWA protocol - thiamine and folate daily - sitter for safety  HTN, worse - BP elevated at 165/98 this AM. Home lisinopril-HCTZ 20-25mg  qd initially held for AKI, which is resolved this AM.  - Monitor BP, prefer tighter control with brain bleed - restart home lisinopril/HCTZ  T2DM.  Uncertain if taking home medications. Last a1c 12/2014 was 11.7. Glucose 246, 177, req 2u ss insulin.  - Check a1c - hold home metformin and invokana  - CBGs AC qhs - sSSI  HLD - this admission chol 275, Trig 372, LDL 159. -  home simvastatin 40 mg held for elevated LFTs, consider restarting  Mild AKI, resolved: SCr improved at 0.83 from 1.4. Baseline 0.8-1.0. Was likely prerenal given acute intoxication and dehydration. - encourage po hydration - monitor AM BMP  Elevated LFT: AST 125, ALT WNL 58. 2/2 EtOH use. Were normal in 2008 and slightly elevated in 2016 - monitor  - hold statin for now  FEN/GI: heart healthy diet, SLIV Prophylaxis: SCDs  Disposition: Home pending workup  Subjective:  Patient alert and oriented this AM. Denies headache. Denies focal neuro deficits. No acute events overnight.  This morning he states he thinks he fell down because he had not eaten all day yesterday at work. He endorses having a very stressful day with testing at work and he forgot to eat. After work he went to the liquor store, had 2 drinks (unspecified alcohol) and then went to [employer] where he fell. No CP, no palpitations, no dyspnea.  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 5/5 strength and normal sensation in all extremities  Laboratory:  Recent Labs Lab 07/17/16 1256 07/17/16 1302 07/18/16 0308  WBC 7.2  --   5.2  HGB 15.9 16.7 14.7  HCT 44.8 49.0 43.2  PLT 193  --  120*    Recent Labs Lab 07/17/16 1256 07/17/16 1302 07/18/16 0308  NA 138 140 138  K 3.9 3.9 4.0  CL 105 104 105  CO2 22  --  21*  BUN CREATININE 1.00 1.40* 0.83  CALCIUM 8.7*  --  9.1  PROT 7.6  --  7.3  BILITOT 0.9  --  0.9  ALKPHOS 165*  --  139*  ALT 58  --  53  AST 125*  --  94*  GLUCOSE 179* 186* 246*    Imaging/Diagnostic Tests: Ct Angio Head W Or Wo Contrast 07/17/2016 IMPRESSION: No intracranial or extracranial stenosis or occlusion of significance. No evidence for aneurysmal subarachnoid hemorrhage. Findings reviewed with ordering provider at time of interpretation.   Ct Head Wo Contrast 07/18/2016 IMPRESSION: Trace similar to decreased subarachnoid hemorrhage along the interhemispheric fissure. Mild to moderate parenchymal brain volume loss for age. Mild atherosclerosis.   Ct Angio Neck W Or Wo Contrast 07/17/2016 IMPRESSION: No intracranial or extracranial stenosis or occlusion of significance. No evidence for aneurysmal subarachnoid hemorrhage.  Ct C-spine No Charge 07/17/2016 FINDINGS: Alignment: Reversal of the normal cervical lordotic curve could be positional or due to spasm. No traumatic subluxation. Skull base and vertebrae: No acute fracture. No primary bone lesion or focal pathologic process. Soft tissues and spinal canal: No prevertebral fluid or swelling. No visible canal hematoma. Disc levels: Advanced disc space narrowing at C5-6 and C6-7. Advanced facet arthropathy at C2-3 on the LEFT. Upper chest: No pneumothorax. Other: None.  IMPRESSION: No cervical spine fracture or traumatic subluxation. Multilevel spondylosis.  Ct Head Code Stroke W/o Cm 07/17/2016 IMPRESSION:  1. Small volume subarachnoid hemorrhage in the interhemispheric fissure, likely posttraumatic. No skull fracture, but acute LEFT parietal scalp hematoma. Advanced cerebral and cerebellar atrophy. 1. ASPECTS is 10.    Howard Pouch, MD 07/18/2016, 8:25 AM PGY-1, [facility] Family Medicine FPTS Intern pager: [phone], text pages welcome

## 2016-07-18 NOTE — Discharge Instructions (Signed)
You were admitted for sustaining a head trauma resulting in a brain bleed secondary to alcoholic intoxication. Neurosurgery and neurology followed her care and determined her bleed was stable.  It is important you continue taking the antiseizure medication called Keppra 500 mg twice a day for 5 more days to complete a seven-day course.   Given your alcohol intoxication, we recommended you stay for evaluation for inpatient alcohol rehabilitation however you preferred to follow-up outpatient. Is important that he stop drinking as this can results and another trauma which may be catastrophic.  You can call Alcoholics Anonymous in [place] at [phone] and determined next available meeting. I am glad you have tried this in the past and would advise you to continue as addiction is something you cannot overcome on your own. I will notify your primary care physician of your admission and recommend follow-up concerning her alcohol abuse.   Finding Treatment for Addiction What is addiction? Addiction is a complex disease of the brain. It causes an uncontrollable (compulsive) need for a substance. You can be addicted to alcohol, illegal drugs, or prescription medicines such as painkillers. Addiction can also be a behavior, like gambling or shopping. The need for the drug or activity can become so strong that you think about it all the time. You can also become physically dependent on a substance. Addiction can change the way your brain works. Because of these changes, getting more of whatever you are addicted to becomes the most important thing to you and feels better than other activities or relationships. Addiction can lead to changes in health, behavior, emotions, relationships, and choices that affect you and everyone around you. How do I know if I need treatment for addiction? Addiction is a progressive disease. Without treatment, addiction can get worse. Living with addiction puts you at higher risk  for injury, poor health, lost employment, loss of money, and even death. You might need treatment for addiction if:  You have tried to stop or cut down, but you cannot.  Your addiction is causing physical health problems.  You find it annoying that your friends and family are concerned about your alcohol or substance use.  You feel guilty about substance abuse or a compulsive behavior.  You have lied or tried to hide your addiction.  You need a particular substance or activity to start your day or to calm down.  You are getting in trouble at school, work, home, or with the police.  You have done something illegal to support your addiction.  You are running out of money because of your addiction.  You have no time for anything other than your addiction. What types of treatment are available? The treatment program that is right for you will depend on many factors, including the type of addiction you have. Treatment programs can be outpatient or inpatient. In an outpatient program, you live at home and go to work or school, but you also go to a clinic for treatment. With an inpatient program, you live and sleep at the program facility during treatment. After treatment, you might need a plan for support during recovery. Other treatment options include:  Medicine.  Some addictions may be treated with prescription medicines.  You might also need medicine to treat anxiety or depression.  Counseling and behavior therapy. Therapy can help individuals and families behave in healthier ways and relate more effectively.  Support groups. Confidential group therapy, such as a 12-step program, can help individuals and families during treatment and recovery.  No single type of program is right for everyone. Many treatment programs involve a combination of education, counseling, and a 12-step, spiritually-based approach. Some treatment programs are government sponsored. They are geared for patients  who do not have private insurance. Treatment programs can vary in many respects, such as:  Cost and types of insurance that are accepted.  Types of on-site medical services that are offered.  Length of stay, setting, and size.  Overall philosophy of treatment. What should I consider when selecting a treatment program? It is important to think about your individual requirements when selecting a treatment program. There are a number of things to consider, such as:  If the program is certified by the appropriate government agency. Even private programs must be certified and employ certified professionals.  If the program is covered by your insurance. If finances are a concern, the first call you should make is to your insurance company, if you have health insurance. Ask for a list of treatment programs that are in your network, and confirm any copayments and deductibles that you may have to pay.  If you do not have insurance, or if you choose to attend a program that does not accept your insurance, discuss whether a payment plan can be set up.  If treatment is available in languages other than English, if needed.  If the program offers detoxification treatment, if needed.  If 12-step meetings are held at the center or if transport is available for patients to attend meetings at other locations.  If the program is professional, organized, and clean.  If the program meets all of your needs, including physical and cultural needs.  If the facility offers specific treatment for your particular addiction.  If support continues to be offered after you have left the program.  If your treatment plan is continually looked at to make sure you are receiving the right treatment at the right time.  If mental health counseling is part of your treatment.  If medicine is included in treatment, if needed.  If your family is included in your treatment plan and if support is offered to them throughout  the treatment process.  How the treatment works to prevent relapse. Where else can I get help?  Your health care provider. Ask him or her to help you find addiction treatment. These discussions are confidential.  The [employer] on Alcoholism and Drug Dependence (NCADD). This group has information about treatment centers and programs for people who have an addiction and for family members.  The telephone number is 1-800-NCA-CALL (281-488-9336).  The website is https://ncadd.org/about-ncadd/our-affiliates  The Substance Abuse and Mental Health Services Administration [facility]). This group will help you find publicly funded treatment centers, help hotlines, and counseling services near you.  The telephone number is 1-800-662-HELP (407-240-9938).  The website is www.findtreatment.[employer].pl In countries outside of the [place]. and [place], look in [employer] for contact information for services in your area. This information is not intended to replace advice given to you by your health care provider. Make sure you discuss any questions you have with your health care provider. Document Released: 02/04/2005 Document Revised: 02/02/2016 Document Reviewed: 12/25/2013 Elsevier Interactive Patient Education  2017 [employer].

## 2016-07-18 NOTE — Progress Notes (Signed)
Discussed with pt code status given AMS during admission. Pt desires to remain full code. Pt w/o other questions. -- Durward Parcel, DO [facility], PGY-1

## 2016-07-19 LAB — GLUCOSE, CAPILLARY: GLUCOSE-CAPILLARY: 171 mg/dL — AB (ref 65–99)

## 2016-07-20 NOTE — Discharge Summary (Signed)
Family Medicine Teaching [facility] Discharge Summary  Patient name: [name] Medical record number: [id number] Date of birth: [birth year] Age: 51 y.o. Gender: male Date of Admission: 07/17/2016  Date of Discharge: 4/29 Admitting Physician: Carney Living, MD  Primary Care Provider: Trevor Iha, MD Consultants: Neurology  Indication for Hospitalization: Fall/Head Trauma  Discharge Diagnoses/Problem List:  Patient Active Problem List   Diagnosis Date Noted  . Encephalopathy   . Fall   . Subarachnoid hemorrhage following injury with brief loss of consciousness but without open intracranial wound (HCC) 07/17/2016  . Traumatic subarachnoid hemorrhage (HCC) 01/15/2015  . Alcohol intoxication (HCC) 01/15/2015  . SAH (subarachnoid hemorrhage) (HCC) 01/15/2015    Disposition: Home  Discharge Condition: Stable  Discharge Exam: From progress note the day of discharge Temp:  [97.6 F (36.4 C)-98.8 F (37.1 C)] 98.7 F (37.1 C) (04/29 0509) Pulse Rate:  [75-105] 105 (04/29 0509) Resp:  [11-22] 18 (04/29 0509) BP: (110-165)/(64-99) 165/98 (04/29 0509) SpO2:  [93 %-100 %] 95 % (04/29 0509) Weight:  [231 lb 11.3 oz (105.1 kg)] 231 lb 11.3 oz (105.1 kg) (04/28 1333) Physical Exam: General: Patient sits comfortably in bed, NAD Cardiovascular: RRR, no m/r/g Respiratory: CTA bil, no W/R/R Abdomen: Soft and nontender, nd Extremities: warm and well-perfused Neuro: CN II-XII grossly intact though with 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 5/5 strength and normal sensation in all extremities  Brief Hospital Course:  [name] a 51 y.o.malepresenting with head trauma s/p fall. PMH is significant for HTN, obesity, T2DM, CKD, AAA.  Patient presented after a witnessed fall in which he fell backwards and hit his head, had loss of consciousness for 2-3 minutes. He was found to have small subarachnoid hemorrhage and acute L  parietal scalp hematoma on CT head. No injuries on CT C spine. Of note patient was intoxicated with alcohol on admission. He denies alcohol being a problem for him.  Repeat CT 12 hours later was stable. Patient remained without any focal neurologic deficits, and no headache. He was considered stable for discharge with 7 days of Keppra 500 mg BID for seizure prophylaxis and follow up with neurology in 1-2 weeks after discharge.  Issues for Follow Up:  1. Patient discharged with Keppra  BID x7 days for seizure prophylaxis.  2. Patient to follow up with neurology 1-2 weeks after discharge  Significant Procedures: None  Significant Labs and Imaging:   Recent Labs Lab 07/17/16 1256 07/17/16 1302 07/18/16 0308  WBC 7.2  --  5.2  HGB 15.9 16.7 14.7  HCT 44.8 49.0 43.2  PLT 193  --  120*    Recent Labs Lab 07/17/16 1256 07/17/16 1302 07/18/16 0308  NA 138 140 138  K 3.9 3.9 4.0  CL 105 104 105  CO2 22  --  21*  GLUCOSE 179* 186* 246*  BUN CREATININE 1.00 1.40* 0.83  CALCIUM 8.7*  --  9.1  ALKPHOS 165*  --  139*  AST 125*  --  94*  ALT 58  --  53  ALBUMIN 3.8  --  3.5   Ct Angio Head W Or Wo Contrast 07/17/2016 IMPRESSION: No intracranial or extracranial stenosis or occlusion of significance. No evidence for aneurysmal subarachnoid hemorrhage. Findings reviewed with ordering provider at time of interpretation.   Ct Head Wo Contrast 07/18/2016 IMPRESSION: Trace similar to decreased subarachnoid hemorrhage along the interhemispheric fissure. Mild to moderate parenchymal brain volume loss for age. Mild atherosclerosis.  Ct Angio Neck W Or Wo Contrast 07/17/2016 IMPRESSION: No intracranial or extracranial stenosis or occlusion of significance. No evidence for aneurysmal subarachnoid hemorrhage.  Ct C-spine No Charge 07/17/2016 FINDINGS: Alignment: Reversal of the normal cervical lordotic curve could be positional or due to spasm. No traumatic subluxation. Skull base  and vertebrae: No acute fracture. No primary bone lesion or focal pathologic process. Soft tissues and spinal canal: No prevertebral fluid or swelling. No visible canal hematoma. Disc levels: Advanced disc space narrowing at C5-6 and C6-7. Advanced facet arthropathy at C2-3 on the LEFT. Upper chest: No pneumothorax. Other: None.  IMPRESSION: No cervical spine fracture or traumatic subluxation. Multilevel spondylosis.  Ct Head Code Stroke W/o Cm 07/17/2016 IMPRESSION:  1. Small volume subarachnoid hemorrhage in the interhemispheric fissure, likely posttraumatic. No skull fracture, but acute LEFT parietal scalp hematoma. Advanced cerebral and cerebellar atrophy. 1. ASPECTS is 10.   Results/Tests Pending at Time of Discharge:   Discharge Medications:  Allergies as of 07/18/2016   No Known Allergies     Medication List    TAKE these medications   FLUoxetine 20 MG tablet Commonly known as:  PROZAC Take 20 mg by mouth daily.   folic acid 1 MG tablet Commonly known as:  FOLVITE Take 1 tablet (1 mg total) by mouth daily.   INVOKANA 300 MG Tabs tablet Generic drug:  canagliflozin Take 300 mg by mouth daily before breakfast.   levETIRAcetam 500 MG tablet Commonly known as:  KEPPRA Take 1 tablet (500 mg total) by mouth 2 (two) times daily.   lisinopril-hydrochlorothiazide 20-25 MG tablet Commonly known as:  PRINZIDE,ZESTORETIC Take 1 tablet by mouth daily.   metFORMIN 500 MG tablet Commonly known as:  GLUCOPHAGE Take 1 tablet (500 mg total) by mouth 2 (two) times daily with a meal. What changed:  how much to take   simvastatin 40 MG tablet Commonly known as:  ZOCOR Take 40 mg by mouth daily.   thiamine 100 MG tablet Take 1 tablet (100 mg total) by mouth daily.   WELCHOL 625 MG tablet Generic drug:  colesevelam Take 1,875 mg by mouth daily with breakfast.       Discharge Instructions: Please refer to Patient Instructions section of EMR for full details.  Patient was  counseled important signs and symptoms that should prompt return to medical care, changes in medications, dietary instructions, activity restrictions, and follow up appointments.   Follow-Up Appointments: Follow-up Information    DETERDING,JAMES L, MD. Call.   Specialty:  Nephrology Why:  Schedule hospital follow up appt within 1-2 weeks after discharge. Contact information: [address] [place] [address] [phone]           Howard Pouch, MD 07/20/2016, 12:09 AM PGY-1, [facility]

## 2017-01-23 ENCOUNTER — Emergency Department (HOSPITAL_COMMUNITY): Payer: BLUE CROSS/BLUE SHIELD

## 2017-01-23 ENCOUNTER — Encounter (HOSPITAL_COMMUNITY): Payer: Self-pay | Admitting: Emergency Medicine

## 2017-01-23 ENCOUNTER — Observation Stay (HOSPITAL_COMMUNITY)
Admission: EM | Admit: 2017-01-23 | Discharge: 2017-01-24 | Discharge status: Against Medical Advice | Payer: BLUE CROSS/BLUE SHIELD | Attending: Emergency Medicine | Admitting: Emergency Medicine

## 2017-01-23 ENCOUNTER — Other Ambulatory Visit: Payer: Self-pay

## 2017-01-23 DIAGNOSIS — I129 Hypertensive chronic kidney disease with stage 1 through stage 4 chronic kidney disease, or unspecified chronic kidney disease: Secondary | ICD-10-CM | POA: Insufficient documentation

## 2017-01-23 DIAGNOSIS — E785 Hyperlipidemia, unspecified: Secondary | ICD-10-CM | POA: Insufficient documentation

## 2017-01-23 DIAGNOSIS — N183 Chronic kidney disease, stage 3 (moderate): Secondary | ICD-10-CM | POA: Diagnosis not present

## 2017-01-23 DIAGNOSIS — I609 Nontraumatic subarachnoid hemorrhage, unspecified: Secondary | ICD-10-CM

## 2017-01-23 DIAGNOSIS — S0990XA Unspecified injury of head, initial encounter: Secondary | ICD-10-CM

## 2017-01-23 DIAGNOSIS — S022XXA Fracture of nasal bones, initial encounter for closed fracture: Secondary | ICD-10-CM | POA: Insufficient documentation

## 2017-01-23 DIAGNOSIS — S0281XB Fracture of other specified skull and facial bones, right side, initial encounter for open fracture: Secondary | ICD-10-CM | POA: Diagnosis present

## 2017-01-23 DIAGNOSIS — S0285XB Fracture of orbit, unspecified, initial encounter for open fracture: Secondary | ICD-10-CM

## 2017-01-23 DIAGNOSIS — E669 Obesity, unspecified: Secondary | ICD-10-CM | POA: Insufficient documentation

## 2017-01-23 DIAGNOSIS — E1122 Type 2 diabetes mellitus with diabetic chronic kidney disease: Secondary | ICD-10-CM | POA: Insufficient documentation

## 2017-01-23 DIAGNOSIS — S020XXA Fracture of vault of skull, initial encounter for closed fracture: Secondary | ICD-10-CM | POA: Insufficient documentation

## 2017-01-23 DIAGNOSIS — S020XXB Fracture of vault of skull, initial encounter for open fracture: Secondary | ICD-10-CM

## 2017-01-23 DIAGNOSIS — S0281XA Fracture of other specified skull and facial bones, right side, initial encounter for closed fracture: Secondary | ICD-10-CM | POA: Diagnosis not present

## 2017-01-23 DIAGNOSIS — S0633AA Contusion and laceration of cerebrum, unspecified, with loss of consciousness status unknown, initial encounter: Secondary | ICD-10-CM | POA: Diagnosis present

## 2017-01-23 DIAGNOSIS — S066X0A Traumatic subarachnoid hemorrhage without loss of consciousness, initial encounter: Secondary | ICD-10-CM | POA: Diagnosis not present

## 2017-01-23 DIAGNOSIS — S06339A Contusion and laceration of cerebrum, unspecified, with loss of consciousness of unspecified duration, initial encounter: Secondary | ICD-10-CM | POA: Diagnosis present

## 2017-01-23 LAB — ETHANOL: Alcohol, Ethyl (B): 311 mg/dL (ref <10)

## 2017-01-23 LAB — COMPREHENSIVE METABOLIC PANEL
ALBUMIN: 3.9 g/dL (ref 3.5–5.0)
ALK PHOS: 111 U/L (ref 38–126)
ALT: 63 U/L (ref 17–63)
ANION GAP: 15 (ref 5–15)
AST: 132 U/L — ABNORMAL HIGH (ref 15–41)
BILIRUBIN TOTAL: 1.2 mg/dL (ref 0.3–1.2)
BUN: 21 mg/dL — AB (ref 6–20)
CALCIUM: 9.2 mg/dL (ref 8.9–10.3)
CO2: 21 mmol/L — ABNORMAL LOW (ref 22–32)
CREATININE: 1.31 mg/dL — AB (ref 0.61–1.24)
Chloride: 100 mmol/L — ABNORMAL LOW (ref 101–111)
GFR calc Af Amer: >60 mL/min (ref >60)
GFR calc non Af Amer: >60 mL/min (ref >60)
GLUCOSE: 112 mg/dL — AB (ref 65–99)
Potassium: 4.1 mmol/L (ref 3.5–5.1)
Sodium: 136 mmol/L (ref 135–145)
TOTAL PROTEIN: 8.5 g/dL — AB (ref 6.5–8.1)

## 2017-01-23 LAB — RAPID URINE DRUG SCREEN, HOSP PERFORMED
Amphetamines: NOT DETECTED
BARBITURATES: NOT DETECTED
Benzodiazepines: NOT DETECTED
Cocaine: NOT DETECTED
Opiates: NOT DETECTED
Tetrahydrocannabinol: NOT DETECTED

## 2017-01-23 LAB — CBC WITH DIFFERENTIAL/PLATELET
BASOS PCT: 1 %
Basophils Absolute: 0.1 10*3/uL (ref 0.0–0.1)
EOS ABS: 0 10*3/uL (ref 0.0–0.7)
EOS PCT: 0 %
HEMATOCRIT: 46 % (ref 39.0–52.0)
HEMOGLOBIN: 15.8 g/dL (ref 13.0–17.0)
Lymphocytes Relative: 25 %
Lymphs Abs: 2.1 10*3/uL (ref 0.7–4.0)
MCH: 35.4 pg — ABNORMAL HIGH (ref 26.0–34.0)
MCHC: 34.3 g/dL (ref 30.0–36.0)
MCV: 103.1 fL — ABNORMAL HIGH (ref 78.0–100.0)
MONO ABS: 0.5 10*3/uL (ref 0.1–1.0)
MONOS PCT: 6 %
Neutro Abs: 5.9 10*3/uL (ref 1.7–7.7)
Neutrophils Relative %: 68 %
Platelets: 180 10*3/uL (ref 150–400)
RBC: 4.46 MIL/uL (ref 4.22–5.81)
RDW: 13.6 % (ref 11.5–15.5)
WBC: 8.6 10*3/uL (ref 4.0–10.5)

## 2017-01-23 LAB — GLUCOSE, CAPILLARY: Glucose-Capillary: 111 mg/dL — ABNORMAL HIGH (ref 65–99)

## 2017-01-23 MED ORDER — PANTOPRAZOLE SODIUM 40 MG PO TBEC: 40.0000 mg | DELAYED_RELEASE_TABLET | Freq: Every day | ORAL | Status: DC

## 2017-01-23 MED ORDER — VITAMIN B-1 100 MG PO TABS
100.0000 mg | ORAL_TABLET | Freq: Every day | ORAL | Status: DC
  Administered 2017-01-23 – 2017-01-24 (×2): 100 mg via ORAL
  Filled 2017-01-23 (×2): qty 1

## 2017-01-23 MED ORDER — MORPHINE SULFATE (PF) 4 MG/ML IV SOLN: 1.0000 mg | INTRAVENOUS | Status: DC | PRN

## 2017-01-23 MED ORDER — POTASSIUM CHLORIDE IN NACL 20-0.9 MEQ/L-% IV SOLN
INTRAVENOUS | Status: DC
  Administered 2017-01-23 – 2017-01-24 (×2): via INTRAVENOUS
  Filled 2017-01-23 (×3): qty 1000

## 2017-01-23 MED ORDER — FLUOXETINE HCL 20 MG PO TABS: 20.0000 mg | ORAL_TABLET | Freq: Every day | ORAL | Status: DC

## 2017-01-23 MED ORDER — ACETAMINOPHEN 325 MG PO TABS
650.0000 mg | ORAL_TABLET | ORAL | Status: DC | PRN
  Administered 2017-01-23: 650 mg via ORAL
  Filled 2017-01-23: qty 2

## 2017-01-23 MED ORDER — PANTOPRAZOLE SODIUM 40 MG IV SOLR: 40.0000 mg | Freq: Every day | INTRAVENOUS | Status: DC

## 2017-01-23 MED ORDER — INSULIN ASPART 100 UNIT/ML ~~LOC~~ SOLN: 0.0000 [IU] | Freq: Three times a day (TID) | SUBCUTANEOUS | Status: DC

## 2017-01-23 MED ORDER — ADULT MULTIVITAMIN W/MINERALS CH
1.0000 | ORAL_TABLET | Freq: Every day | ORAL | Status: DC
  Administered 2017-01-23 – 2017-01-24 (×2): 1 via ORAL
  Filled 2017-01-23 (×3): qty 1

## 2017-01-23 MED ORDER — FOLIC ACID 1 MG PO TABS
1.0000 mg | ORAL_TABLET | Freq: Every day | ORAL | Status: DC
  Administered 2017-01-23 – 2017-01-24 (×2): 1 mg via ORAL
  Filled 2017-01-23 (×2): qty 1

## 2017-01-23 MED ORDER — LORAZEPAM 2 MG/ML IJ SOLN: 1.0000 mg | Freq: Four times a day (QID) | INTRAMUSCULAR | Status: DC | PRN

## 2017-01-23 MED ORDER — OXYCODONE HCL 5 MG PO TABS: 5.0000 mg | ORAL_TABLET | ORAL | Status: DC | PRN

## 2017-01-23 MED ORDER — ONDANSETRON HCL 4 MG/2ML IJ SOLN
4.0000 mg | Freq: Four times a day (QID) | INTRAMUSCULAR | Status: DC | PRN
  Administered 2017-01-24: 4 mg via INTRAVENOUS
  Filled 2017-01-23: qty 2

## 2017-01-23 MED ORDER — SODIUM CHLORIDE 0.9 % IV BOLUS (SEPSIS)
1000.0000 mL | Freq: Once | INTRAVENOUS | Status: AC
  Administered 2017-01-23: 1000 mL via INTRAVENOUS

## 2017-01-23 MED ORDER — LORAZEPAM 1 MG PO TABS: 1.0000 mg | ORAL_TABLET | Freq: Four times a day (QID) | ORAL | Status: DC | PRN

## 2017-01-23 MED ORDER — DOCUSATE SODIUM 100 MG PO CAPS
100.0000 mg | ORAL_CAPSULE | Freq: Two times a day (BID) | ORAL | Status: DC
  Administered 2017-01-23 – 2017-01-24 (×2): 100 mg via ORAL
  Filled 2017-01-23 (×2): qty 1

## 2017-01-23 MED ORDER — PANTOPRAZOLE SODIUM 40 MG PO TBEC
40.0000 mg | DELAYED_RELEASE_TABLET | Freq: Every day | ORAL | Status: DC
  Administered 2017-01-23: 40 mg via ORAL
  Filled 2017-01-23: qty 1

## 2017-01-23 MED ORDER — ONDANSETRON 4 MG PO TBDP: 4.0000 mg | ORAL_TABLET | Freq: Four times a day (QID) | ORAL | Status: DC | PRN

## 2017-01-23 MED ORDER — THIAMINE HCL 100 MG/ML IJ SOLN: 100.0000 mg | Freq: Every day | INTRAMUSCULAR | Status: DC

## 2017-01-23 NOTE — ED Notes (Signed)
Pt alert and oriented x4 at this time. However unable to recall event that took place today resulting in head injury.

## 2017-01-23 NOTE — ED Notes (Signed)
Trauma surgery at bedside.
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
## 2017-01-23 NOTE — ED Notes (Signed)
Pt called out requesting to leave department. Assured patient testing is in process. MD Adriana Simasook aware of patient concerns of wanting to leave.
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
## 2017-01-24 ENCOUNTER — Other Ambulatory Visit: Payer: Self-pay

## 2017-01-24 LAB — COMPREHENSIVE METABOLIC PANEL
ALK PHOS: 106 U/L (ref 38–126)
ALT: 53 U/L (ref 17–63)
ANION GAP: 12 (ref 5–15)
AST: 101 U/L — ABNORMAL HIGH (ref 15–41)
Albumin: 3.5 g/dL (ref 3.5–5.0)
BILIRUBIN TOTAL: 1.9 mg/dL — AB (ref 0.3–1.2)
BUN: 14 mg/dL (ref 6–20)
CHLORIDE: 97 mmol/L — AB (ref 101–111)
CO2: 22 mmol/L (ref 22–32)
Calcium: 8.6 mg/dL — ABNORMAL LOW (ref 8.9–10.3)
Creatinine, Ser: 0.93 mg/dL (ref 0.61–1.24)
GFR calc Af Amer: >60 mL/min (ref >60)
GFR calc non Af Amer: >60 mL/min (ref >60)
GLUCOSE: 114 mg/dL — AB (ref 65–99)
POTASSIUM: 4.3 mmol/L (ref 3.5–5.1)
SODIUM: 131 mmol/L — AB (ref 135–145)
TOTAL PROTEIN: 7.8 g/dL (ref 6.5–8.1)

## 2017-01-24 LAB — CBC
HCT: 40 % (ref 39.0–52.0)
HEMOGLOBIN: 14.1 g/dL (ref 13.0–17.0)
MCH: 35.1 pg — AB (ref 26.0–34.0)
MCHC: 35.3 g/dL (ref 30.0–36.0)
MCV: 99.5 fL (ref 78.0–100.0)
Platelets: 145 10*3/uL — ABNORMAL LOW (ref 150–400)
RBC: 4.02 MIL/uL — ABNORMAL LOW (ref 4.22–5.81)
RDW: 13.2 % (ref 11.5–15.5)
WBC: 7.1 10*3/uL (ref 4.0–10.5)

## 2017-01-24 LAB — GLUCOSE, CAPILLARY: GLUCOSE-CAPILLARY: 120 mg/dL — AB (ref 65–99)

## 2017-01-24 LAB — HEMOGLOBIN A1C
Hgb A1c MFr Bld: 5.9 % — ABNORMAL HIGH (ref 4.8–5.6)
Mean Plasma Glucose: 122.63 mg/dL

## 2017-01-24 MED ORDER — BACITRACIN ZINC 500 UNIT/GM EX OINT
TOPICAL_OINTMENT | Freq: Two times a day (BID) | CUTANEOUS | Status: DC
  Administered 2017-01-24: 1 via TOPICAL
  Filled 2017-01-24: qty 28.35
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
## 2017-01-24 NOTE — Progress Notes (Signed)
Pt requesting to leave AMA states "you all are wasting my time, I have more important things to take care of". Dr. Janee Mornhompson aware.  Pt educated on AMA.

## 2017-02-12 ENCOUNTER — Other Ambulatory Visit: Payer: Self-pay

## 2017-02-12 ENCOUNTER — Emergency Department (HOSPITAL_COMMUNITY)
Admission: EM | Admit: 2017-02-12 | Discharge: 2017-02-12 | Disposition: A | Discharge status: Home / Self Care | Payer: BLUE CROSS/BLUE SHIELD | Attending: Emergency Medicine | Admitting: Emergency Medicine

## 2017-02-12 ENCOUNTER — Encounter (HOSPITAL_COMMUNITY): Payer: Self-pay | Admitting: Emergency Medicine

## 2017-02-12 DIAGNOSIS — I129 Hypertensive chronic kidney disease with stage 1 through stage 4 chronic kidney disease, or unspecified chronic kidney disease: Secondary | ICD-10-CM | POA: Diagnosis not present

## 2017-02-12 DIAGNOSIS — Z7722 Contact with and (suspected) exposure to environmental tobacco smoke (acute) (chronic): Secondary | ICD-10-CM | POA: Diagnosis not present

## 2017-02-12 DIAGNOSIS — N183 Chronic kidney disease, stage 3 (moderate): Secondary | ICD-10-CM | POA: Insufficient documentation

## 2017-02-12 DIAGNOSIS — Z7984 Long term (current) use of oral hypoglycemic drugs: Secondary | ICD-10-CM | POA: Diagnosis not present

## 2017-02-12 DIAGNOSIS — Z79899 Other long term (current) drug therapy: Secondary | ICD-10-CM | POA: Diagnosis not present

## 2017-02-12 DIAGNOSIS — F10920 Alcohol use, unspecified with intoxication, uncomplicated: Secondary | ICD-10-CM

## 2017-02-12 DIAGNOSIS — E1122 Type 2 diabetes mellitus with diabetic chronic kidney disease: Secondary | ICD-10-CM | POA: Insufficient documentation

## 2017-02-12 DIAGNOSIS — F10120 Alcohol abuse with intoxication, uncomplicated: Secondary | ICD-10-CM | POA: Insufficient documentation

## 2017-02-12 DIAGNOSIS — F10929 Alcohol use, unspecified with intoxication, unspecified: Secondary | ICD-10-CM | POA: Diagnosis present

## 2017-02-12 DIAGNOSIS — F1010 Alcohol abuse, uncomplicated: Secondary | ICD-10-CM

## 2017-02-12 MED ORDER — ADULT MULTIVITAMIN W/MINERALS CH
1.0000 | ORAL_TABLET | Freq: Once | ORAL | Status: AC
  Administered 2017-02-12: 1 via ORAL
  Filled 2017-02-12: qty 1

## 2017-02-12 MED ORDER — LACTATED RINGERS IV BOLUS (SEPSIS)
1000.0000 mL | Freq: Once | INTRAVENOUS | Status: AC
  Administered 2017-02-12: 1000 mL via INTRAVENOUS

## 2017-02-12 NOTE — ED Notes (Signed)
Pt requesting to be discharged home. Dr Juleen ChinaKohut made aware. Pt ambulating without assistance.
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
## 2017-02-13 ENCOUNTER — Emergency Department (HOSPITAL_COMMUNITY): Payer: BLUE CROSS/BLUE SHIELD

## 2017-02-13 ENCOUNTER — Encounter (HOSPITAL_COMMUNITY): Payer: Self-pay | Admitting: Emergency Medicine

## 2017-02-13 ENCOUNTER — Emergency Department (HOSPITAL_COMMUNITY)
Admission: EM | Admit: 2017-02-13 | Discharge: 2017-02-13 | Disposition: A | Discharge status: Home / Self Care | Payer: BLUE CROSS/BLUE SHIELD | Attending: Emergency Medicine | Admitting: Emergency Medicine

## 2017-02-13 DIAGNOSIS — F10920 Alcohol use, unspecified with intoxication, uncomplicated: Secondary | ICD-10-CM | POA: Insufficient documentation

## 2017-02-13 DIAGNOSIS — R4182 Altered mental status, unspecified: Secondary | ICD-10-CM | POA: Diagnosis not present

## 2017-02-13 DIAGNOSIS — N183 Chronic kidney disease, stage 3 (moderate): Secondary | ICD-10-CM | POA: Insufficient documentation

## 2017-02-13 DIAGNOSIS — E1122 Type 2 diabetes mellitus with diabetic chronic kidney disease: Secondary | ICD-10-CM | POA: Insufficient documentation

## 2017-02-13 DIAGNOSIS — Z7722 Contact with and (suspected) exposure to environmental tobacco smoke (acute) (chronic): Secondary | ICD-10-CM | POA: Insufficient documentation

## 2017-02-13 DIAGNOSIS — I129 Hypertensive chronic kidney disease with stage 1 through stage 4 chronic kidney disease, or unspecified chronic kidney disease: Secondary | ICD-10-CM | POA: Insufficient documentation

## 2017-02-13 DIAGNOSIS — Z7984 Long term (current) use of oral hypoglycemic drugs: Secondary | ICD-10-CM | POA: Diagnosis not present

## 2017-02-13 DIAGNOSIS — Z79899 Other long term (current) drug therapy: Secondary | ICD-10-CM | POA: Diagnosis not present

## 2017-02-13 LAB — CBC WITH DIFFERENTIAL/PLATELET
Basophils Absolute: 0.1 10*3/uL (ref 0.0–0.1)
Basophils Relative: 1 %
Eosinophils Absolute: 0.1 10*3/uL (ref 0.0–0.7)
Eosinophils Relative: 1 %
HCT: 42.4 % (ref 39.0–52.0)
Hemoglobin: 14.8 g/dL (ref 13.0–17.0)
Lymphocytes Relative: 37 %
Lymphs Abs: 2.5 10*3/uL (ref 0.7–4.0)
MCH: 35.7 pg — ABNORMAL HIGH (ref 26.0–34.0)
MCHC: 34.9 g/dL (ref 30.0–36.0)
MCV: 102.2 fL — ABNORMAL HIGH (ref 78.0–100.0)
Monocytes Absolute: 0.5 10*3/uL (ref 0.1–1.0)
Monocytes Relative: 8 %
Neutro Abs: 3.6 10*3/uL (ref 1.7–7.7)
Neutrophils Relative %: 53 %
Platelets: 133 10*3/uL — ABNORMAL LOW (ref 150–400)
RBC: 4.15 MIL/uL — ABNORMAL LOW (ref 4.22–5.81)
RDW: 13.8 % (ref 11.5–15.5)
WBC: 6.8 10*3/uL (ref 4.0–10.5)

## 2017-02-13 LAB — COMPREHENSIVE METABOLIC PANEL
ALBUMIN: 3.7 g/dL (ref 3.5–5.0)
ALK PHOS: 227 U/L — AB (ref 38–126)
ALT: 40 U/L (ref 17–63)
ANION GAP: 8 (ref 5–15)
AST: 71 U/L — ABNORMAL HIGH (ref 15–41)
BILIRUBIN TOTAL: 0.9 mg/dL (ref 0.3–1.2)
BUN: 10 mg/dL (ref 6–20)
CALCIUM: 8.3 mg/dL — AB (ref 8.9–10.3)
CO2: 28 mmol/L (ref 22–32)
CREATININE: 0.71 mg/dL (ref 0.61–1.24)
Chloride: 99 mmol/L — ABNORMAL LOW (ref 101–111)
GFR calc Af Amer: >60 mL/min (ref >60)
GFR calc non Af Amer: >60 mL/min (ref >60)
GLUCOSE: 303 mg/dL — AB (ref 65–99)
Potassium: 3.8 mmol/L (ref 3.5–5.1)
Sodium: 135 mmol/L (ref 135–145)
TOTAL PROTEIN: 7.7 g/dL (ref 6.5–8.1)

## 2017-02-13 LAB — ETHANOL
ALCOHOL ETHYL (B): 344 mg/dL — AB (ref <10)
Alcohol, Ethyl (B): 452 mg/dL (ref <10)

## 2017-02-13 MED ORDER — LORAZEPAM 1 MG PO TABS
1.0000 mg | ORAL_TABLET | Freq: Once | ORAL | Status: AC
  Administered 2017-02-13: 1 mg via ORAL
  Filled 2017-02-13: qty 1

## 2017-02-13 MED ORDER — SODIUM CHLORIDE 0.9 % IV BOLUS (SEPSIS)
1000.0000 mL | Freq: Once | INTRAVENOUS | Status: AC
  Administered 2017-02-13: 1000 mL via INTRAVENOUS

## 2017-02-13 NOTE — ED Notes (Addendum)
Pt was transferred to TCU with civilian clothes with personal cellphone.

## 2017-02-13 NOTE — ED Notes (Signed)
Date and time results received: 02/13/17 2053 (use smartphrase ".now" to insert current time)  Test: Alcohol Critical Value: 344  Name of Provider Notified: Lillia AbedLindsay, RN  Orders Received? Or Actions Taken?:

## 2017-02-13 NOTE — ED Notes (Signed)
ED Provider at bedside. 

## 2017-02-13 NOTE — ED Notes (Signed)
Pt belongings returned before discharge.

## 2017-02-13 NOTE — ED Notes (Signed)
Placed pt's belongings in locker 30.

## 2017-02-13 NOTE — ED Notes (Signed)
Pt threatening to leave and putting on his shoes. EDP informed. IVC paperwork begun.

## 2017-02-13 NOTE — ED Notes (Signed)
Provider at bedside and pt is ambulating without difficulty.

## 2017-02-13 NOTE — ED Provider Notes (Signed)
[facility] Provider Note   CSN: [id number] Arrival date & time: 02/12/17  1303     History   Chief Complaint Chief Complaint  Patient presents with  . Fall  . Alcohol Problem    HPI [name] is a 51 y.o. male.  HPI   51 year old male with alcohol intoxication.  He was [place] [employer] when he fell.  Patient cannot tell me how/why he fell.  History of alcohol abuse.  Was drinking today.  He has some abrasions to his left upper extremity.  He denies any significant acute pain.  Past Medical History:  Diagnosis Date  . Abdominal aneurysm [facility](HCC)    Patient reported   . Chronic kidney disease, stage III (moderate) (HCC)    Deterding  . Hypertension   . Obesity (BMI 30-39.9)   . Other and unspecified hyperlipidemia   . Type II or unspecified type diabetes mellitus without mention of complication, not stated as uncontrolled     Patient Active Problem List   Diagnosis Date Noted  . Frontal lobe contusion (HCC) 01/23/2017  . Encephalopathy   . Fall   . Subarachnoid hemorrhage following injury with brief loss of consciousness but without open intracranial wound (HCC) 07/17/2016  . Traumatic subarachnoid hemorrhage (HCC) 01/15/2015  . Alcohol intoxication (HCC) 01/15/2015  . SAH (subarachnoid hemorrhage) (HCC) 01/15/2015    Past Surgical History:  Procedure Laterality Date  . stomach vessel aneurysm     Patient reported        Home Medications    Prior to Admission medications   Medication Sig Start Date End Date Taking? Authorizing Provider  canagliflozin (INVOKANA) 300 MG TABS tablet Take 300 mg by mouth daily before breakfast.   Yes [provider]  FLUoxetine (PROZAC) 20 MG tablet Take 20 mg by mouth daily.   Yes [provider]  folic acid (FOLVITE) 1 MG tablet Take 1 tablet (1 mg total) by mouth daily. 01/16/15  Yes Alison Murrayevine, Alma M, MD  levETIRAcetam (KEPPRA) 500 MG tablet Take 1 tablet (500 mg  total) by mouth 2 (two) times daily. 07/18/16  Yes Wendee BeaversMcMullen, David J, DO  lisinopril-hydrochlorothiazide (PRINZIDE,ZESTORETIC) 20-25 MG tablet Take 1 tablet by mouth daily.   Yes [provider]  metFORMIN (GLUCOPHAGE) 500 MG tablet Take 1 tablet (500 mg total) by mouth 2 (two) times daily with a meal. Patient taking differently: Take 1,000 mg by mouth 2 (two) times daily with a meal.  01/16/15  Yes Alison Murrayevine, Alma M, MD  simvastatin (ZOCOR) 40 MG tablet Take 40 mg by mouth daily.     Yes [provider]  [facility] 625 MG tablet Take 1,875 mg by mouth daily with breakfast.  10/28/14  Yes [provider]  thiamine 100 MG tablet Take 1 tablet (100 mg total) by mouth daily. Patient not taking: Reported on 07/17/2016 01/16/15   Alison Murrayevine, Alma M, MD    Family History Family History  Problem Relation Age of Onset  . Diabetes Mellitus II Mother     Social History Social History   Tobacco Use  . Smoking status: Passive Smoke Exposure - Never Smoker  . Smokeless tobacco: Never Used  Substance Use Topics  . Alcohol use: Yes    Alcohol/week: 7.2 oz    Types: 12 Cans of beer per week  . Drug use: No     Allergies   Patient has no known allergies.   Review of Systems Review of Systems  All systems  reviewed and negative, other than as noted in HPI.  Physical Exam Updated Vital Signs BP 117/75   Pulse 79   Resp 20   Ht 5\' 11"  (1.803 m)   Wt 98.4 kg (217 lb)   SpO2 97%   BMI 30.27 kg/m   Physical Exam  Constitutional: He appears well-developed and well-nourished. No distress.  HENT:  Head: Normocephalic and atraumatic.  Eyes: Conjunctivae are normal. Right eye exhibits no discharge. Left eye exhibits no discharge.  Neck: Neck supple.  Cardiovascular: Normal rate, regular rhythm and normal heart sounds. Exam reveals no gallop and no friction rub.  No murmur heard. Pulmonary/Chest: Effort normal and breath sounds normal. No respiratory distress.  Abdominal:  Soft. He exhibits no distension. There is no tenderness.  Musculoskeletal: He exhibits no edema or tenderness.  No midline spinal tenderness.  Some scattered abrasions to his left upper extremity.  No discrete bony tenderness or apparent pain with range of motion of the large joints.  Neurological:  Likely intoxicated.  Speech is somewhat slurred but understandable.  He follows commands.  No focal deficits noted.  Skin: Skin is warm and dry.  Nursing note and vitals reviewed.    ED Treatments / Results  Labs (all labs ordered are listed, but only abnormal results are displayed) Labs Reviewed - No data to display  EKG  EKG Interpretation None       Radiology Ct Head Wo Contrast  Result Date: 02/13/2017 CLINICAL DATA:  51 year old male with altered mental status. Probable intoxication. EXAM: CT HEAD WITHOUT CONTRAST TECHNIQUE: Contiguous axial images were obtained from the base of the skull through the vertex without intravenous contrast. COMPARISON:  Head CT 01/23/2017. FINDINGS: Brain: No evidence of acute infarction, hemorrhage, hydrocephalus, extra-axial collection or mass lesion/mass effect. Vascular: No hyperdense vessel or unexpected calcification. Skull: Normal. Negative for fracture or focal lesion. Sinuses/Orbits: No acute finding.  Prosthetic right globe. Other: None. IMPRESSION: 1. No acute intracranial abnormalities. 2. The appearance of the brain is normal. Previously noted right-sided anterior subarachnoid hemorrhage has resolved. Electronically Signed   By: Trudie Reedaniel  Entrikin M.D.   On: 02/13/2017 14:44    Procedures Procedures (including critical care time)  Medications Ordered in ED Medications  lactated ringers bolus 1,000 mL (0 mLs Intravenous Stopped 02/12/17 1518)  multivitamin with minerals tablet 1 tablet (1 tablet Oral Given 02/12/17 1432)     Initial Impression / Assessment and Plan / ED Course  I have reviewed the triage vital signs and the nursing  notes.  Pertinent labs & imaging results that were available during my care of the patient were reviewed by me and considered in my medical decision making (see chart for details).     51 year old male with alcohol intoxication after fall.  He has no acute complaints.  Some abrasions to the left upper extremity but I doubt fracture or other serious trauma.  He ambulated in the emergency room.  He denies SI or HI.  Will discharge home.  Final Clinical Impressions(s) / ED Diagnoses   Final diagnoses:  Alcohol abuse  Alcoholic intoxication without complication [facility](HCC)    ED Discharge Orders    None       Raeford RazorKohut, Johnathon Olden, MD 02/13/17 1536
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
## 2017-02-13 NOTE — ED Provider Notes (Signed)
[facility] DEPT Provider Note   CSN: [id number] Arrival date & time: 02/13/17  1336     History   Chief Complaint Chief Complaint  Patient presents with  . Alcohol Intoxication    HPI [name] is a 51 y.o. male past medical history of AAA, hypertension, alcohol abuse who presents escorted by [employer] for alcohol intoxication.  Per [employer], patient was found behind the wheel of his parked car.  Unsure of how long he was there.  Patient endorses drinking alcohol but is unable to tell me when or how much he took.  [employer] reports that this is the second time that they have had to pick the patient up in the last 48 hours.    EM LEVEL 5 CAVEAT  The history is provided by the police.    Past Medical History:  Diagnosis Date  . Abdominal aneurysm [facility])    Patient reported   . Chronic kidney disease, stage III (moderate) (HCC)    Deterding  . Hypertension   . Obesity (BMI 30-39.9)   . Other and unspecified hyperlipidemia   . Type II or unspecified type diabetes mellitus without mention of complication, not stated as uncontrolled     Patient Active Problem List   Diagnosis Date Noted  . Frontal lobe contusion ([place]) 01/23/2017  . Encephalopathy   . Fall   . Subarachnoid hemorrhage following injury with brief loss of consciousness but without open intracranial wound ([place]) 07/17/2016  . Traumatic subarachnoid hemorrhage ([place]) 01/15/2015  . Alcohol intoxication ([place]) 01/15/2015  . SAH (subarachnoid hemorrhage) ([place]) 01/15/2015    Past Surgical History:  Procedure Laterality Date  . stomach vessel aneurysm     Patient reported        Home Medications    Prior to Admission medications   Medication Sig Start Date End Date Taking? Authorizing Provider  canagliflozin (INVOKANA) 300 MG TABS tablet Take 300 mg by mouth daily before breakfast.    [provider]  FLUoxetine (PROZAC) 20 MG tablet Take 20 mg by mouth daily.    [provider]  folic acid (FOLVITE) 1 MG tablet Take 1 tablet (1 mg total) by mouth daily. 01/16/15   Robbie Lis, MD  levETIRAcetam (KEPPRA) 500 MG tablet Take 1 tablet (500 mg total) by mouth 2 (two) times daily. 07/18/16   Gustavus Bing, DO  lisinopril-hydrochlorothiazide (PRINZIDE,ZESTORETIC) 20-25 MG tablet Take 1 tablet by mouth daily.    [provider]  metFORMIN (GLUCOPHAGE) 500 MG tablet Take 1 tablet (500 mg total) by mouth 2 (two) times daily with a meal. Patient taking differently: Take 1,000 mg by mouth 2 (two) times daily with a meal.  01/16/15   Robbie Lis, MD  simvastatin (ZOCOR) 40 MG tablet Take 40 mg by mouth daily.      [provider]  thiamine 100 MG tablet Take 1 tablet (100 mg total) by mouth daily. Patient not taking: Reported on 07/17/2016 01/16/15   Robbie Lis, MD  [facility] 625 MG tablet Take 1,875 mg by mouth daily with breakfast.  10/28/14   [provider]    Family History Family History  Problem Relation Age of Onset  . Diabetes Mellitus II Mother     Social History Social History   Tobacco Use  . Smoking status: Passive Smoke Exposure - Never Smoker  . Smokeless tobacco: Never Used  Substance Use Topics  . Alcohol use: Yes    Alcohol/week: 7.2 oz  Types: 12 Cans of beer per week  . Drug use: No     Allergies   Patient has no known allergies.   Review of Systems Review of Systems  Unable to perform ROS: Mental status change     Physical Exam Updated Vital Signs BP 136/80   Pulse 85   Temp 98.4 F (36.9 C) (Oral)   Resp 14   SpO2 96%   Physical Exam  Constitutional: He is oriented to person, place, and time. He appears well-developed and well-nourished.  Sitting comfortably on wheelchar  HENT:  Head: Normocephalic and atraumatic.  Mouth/Throat: Oropharynx is clear and moist and mucous membranes are normal.  No tenderness to palpation of skull. No deformities or crepitus noted. No open  wounds, abrasions or lacerations.   Eyes: Conjunctivae and lids are normal.  Right pupil is unreactive as patient has prothesis.   Neck: Normal range of motion.  Cardiovascular: Normal rate, regular rhythm, normal heart sounds and normal pulses. Exam reveals no gallop and no friction rub.  No murmur heard. Pulmonary/Chest: Effort normal and breath sounds normal.  Abdominal: Soft. Normal appearance. There is no tenderness. There is no rigidity and no guarding.  Musculoskeletal: Normal range of motion.  Neurological: He is alert and oriented to person, place, and time.  Skin: Skin is warm and dry. Capillary refill takes less than 2 seconds.  Psychiatric: He has a normal mood and affect. His speech is normal.  Nursing note and vitals reviewed.    ED Treatments / Results  Labs (all labs ordered are listed, but only abnormal results are displayed) Labs Reviewed  COMPREHENSIVE METABOLIC PANEL - Abnormal; Notable for the following components:      Result Value   Chloride 99 (*)    Glucose, Bld 303 (*)    Calcium 8.3 (*)    AST 71 (*)    Alkaline Phosphatase 227 (*)    All other components within normal limits  ETHANOL - Abnormal; Notable for the following components:   Alcohol, Ethyl (B) 452 (*)    All other components within normal limits  CBC WITH DIFFERENTIAL/PLATELET - Abnormal; Notable for the following components:   RBC 4.15 (*)    MCV 102.2 (*)    MCH 35.7 (*)    Platelets 133 (*)    All other components within normal limits  ETHANOL - Abnormal; Notable for the following components:   Alcohol, Ethyl (B) 344 (*)    All other components within normal limits  URINALYSIS, ROUTINE W REFLEX MICROSCOPIC  RAPID URINE DRUG SCREEN, HOSP PERFORMED    EKG  EKG Interpretation None       Radiology Ct Head Wo Contrast  Result Date: 02/13/2017 CLINICAL DATA:  51 year old male with altered mental status. Probable intoxication. EXAM: CT HEAD WITHOUT CONTRAST TECHNIQUE: Contiguous  axial images were obtained from the base of the skull through the vertex without intravenous contrast. COMPARISON:  Head CT 01/23/2017. FINDINGS: Brain: No evidence of acute infarction, hemorrhage, hydrocephalus, extra-axial collection or mass lesion/mass effect. Vascular: No hyperdense vessel or unexpected calcification. Skull: Normal. Negative for fracture or focal lesion. Sinuses/Orbits: No acute finding.  Prosthetic right globe. Other: None. IMPRESSION: 1. No acute intracranial abnormalities. 2. The appearance of the brain is normal. Previously noted right-sided anterior subarachnoid hemorrhage has resolved. Electronically Signed   By: Vinnie Langton M.D.   On: 02/13/2017 14:44    Procedures Procedures (including critical care time)  Medications Ordered in ED Medications  sodium chloride 0.9 %  bolus 1,000 mL (0 mLs Intravenous Stopped 02/13/17 1637)  sodium chloride 0.9 % bolus 1,000 mL (0 mLs Intravenous Stopped 02/13/17 1824)  LORazepam (ATIVAN) tablet 1 mg (1 mg Oral Given 02/13/17 1830)     Initial Impression / Assessment and Plan / ED Course  I have reviewed the triage vital signs and the nursing notes.  Pertinent labs & imaging results that were available during my care of the patient were reviewed by me and considered in my medical decision making (see chart for details).     51 y.o. M who presents with [employer] escort for concerns of alcohol intoxication.  Per [employer], patient was found behind the seat of his car.  Unsure how long he was there.  Patient does endorse to drinking alcohol but does not tell me how much he took.  Patient denies any SI/HI at this time but given intoxication, will let patient sober up and reassess.  Given the patient has a history of a subarachnoid hemorrhage and we do not know what was going on today, will obtain CT head.  We will also obtain basic labs, including ethanol, CMP, CBC.  Labs reviewed.  Ethanol is 452.  CBC shows no abnormalities.  CMP shows  elevated glucose.  Alk phos is elevated.  AST is slightly elevated.  CT head is without any abnormalities.  There is mention of the previous subarachnoid hemorrhage that has resolved.  Discussed patient with Dr. Leonette [facility].  We will not do an IVC for alcohol intoxication.  However, patient is clinically intoxicated.  Will hold patient for 24 hours until he is sober.  Patient moved back to TCU.  He is requesting to leave.  I informed him that he is unable to leave [place] we have a 24-hour hold on him.  Patient denies any SI/HI at this time.  We will repeat ethanol level.  Repeat ethanol level improved. Patient is alert and oriented, walking around the department. He is calm and cooperative with me during our conservation. He denies any SI/HI.  No ataxia noted on exam.  He is speaking without any slurred speech.  She is clinically sober at this time.  Patient exhibits medical decision making capacity.  He does not wish to stay at this time.  Vital signs stable.  Will plan discharge at this time. Strict return precautions discussed. Patient expresses understanding and agreement to plan.    Final Clinical Impressions(s) / ED Diagnoses   Final diagnoses:  Alcoholic intoxication without complication [facility])    ED Discharge Orders    None       Desma Mcgregor 02/13/17 2113    Fatima Blank, MD 02/14/17 1627

## 2017-02-13 NOTE — ED Notes (Signed)
Pt belongings placed in cabinet by Margo AyeHall C.

## 2017-02-13 NOTE — Discharge Instructions (Signed)
Return to the Emergency Department for any worsening or concerning symptoms.

## 2017-03-23 ENCOUNTER — Emergency Department (HOSPITAL_COMMUNITY)
Admission: EM | Admit: 2017-03-23 | Discharge: 2017-03-24 | Disposition: A | Discharge status: Home / Self Care | Payer: BLUE CROSS/BLUE SHIELD | Attending: Emergency Medicine | Admitting: Emergency Medicine

## 2017-03-23 ENCOUNTER — Encounter (HOSPITAL_COMMUNITY): Payer: Self-pay | Admitting: Emergency Medicine

## 2017-03-23 DIAGNOSIS — Z7722 Contact with and (suspected) exposure to environmental tobacco smoke (acute) (chronic): Secondary | ICD-10-CM | POA: Insufficient documentation

## 2017-03-23 DIAGNOSIS — N183 Chronic kidney disease, stage 3 (moderate): Secondary | ICD-10-CM | POA: Insufficient documentation

## 2017-03-23 DIAGNOSIS — F1014 Alcohol abuse with alcohol-induced mood disorder: Secondary | ICD-10-CM

## 2017-03-23 DIAGNOSIS — Y908 Blood alcohol level of 240 mg/100 ml or more: Secondary | ICD-10-CM | POA: Insufficient documentation

## 2017-03-23 DIAGNOSIS — E1122 Type 2 diabetes mellitus with diabetic chronic kidney disease: Secondary | ICD-10-CM | POA: Diagnosis not present

## 2017-03-23 DIAGNOSIS — F10920 Alcohol use, unspecified with intoxication, uncomplicated: Secondary | ICD-10-CM | POA: Diagnosis not present

## 2017-03-23 DIAGNOSIS — R45851 Suicidal ideations: Secondary | ICD-10-CM | POA: Insufficient documentation

## 2017-03-23 DIAGNOSIS — F1020 Alcohol dependence, uncomplicated: Secondary | ICD-10-CM | POA: Diagnosis not present

## 2017-03-23 DIAGNOSIS — I129 Hypertensive chronic kidney disease with stage 1 through stage 4 chronic kidney disease, or unspecified chronic kidney disease: Secondary | ICD-10-CM | POA: Diagnosis not present

## 2017-03-23 DIAGNOSIS — Z046 Encounter for general psychiatric examination, requested by authority: Secondary | ICD-10-CM | POA: Diagnosis not present

## 2017-03-23 DIAGNOSIS — R4182 Altered mental status, unspecified: Secondary | ICD-10-CM | POA: Diagnosis present

## 2017-03-23 LAB — COMPREHENSIVE METABOLIC PANEL
ALBUMIN: 3.4 g/dL — AB (ref 3.5–5.0)
ALT: 38 U/L (ref 17–63)
ANION GAP: 9 (ref 5–15)
AST: 109 U/L — AB (ref 15–41)
Alkaline Phosphatase: 185 U/L — ABNORMAL HIGH (ref 38–126)
BUN: 6 mg/dL (ref 6–20)
CHLORIDE: 103 mmol/L (ref 101–111)
CO2: 22 mmol/L (ref 22–32)
Calcium: 8.1 mg/dL — ABNORMAL LOW (ref 8.9–10.3)
Creatinine, Ser: 0.73 mg/dL (ref 0.61–1.24)
GFR calc Af Amer: >60 mL/min (ref >60)
GFR calc non Af Amer: >60 mL/min (ref >60)
GLUCOSE: 213 mg/dL — AB (ref 65–99)
POTASSIUM: 3.4 mmol/L — AB (ref 3.5–5.1)
Sodium: 134 mmol/L — ABNORMAL LOW (ref 135–145)
Total Bilirubin: 1.6 mg/dL — ABNORMAL HIGH (ref 0.3–1.2)
Total Protein: 8.1 g/dL (ref 6.5–8.1)

## 2017-03-23 LAB — RAPID URINE DRUG SCREEN, HOSP PERFORMED
Amphetamines: NOT DETECTED
BARBITURATES: NOT DETECTED
Benzodiazepines: NOT DETECTED
COCAINE: NOT DETECTED
OPIATES: NOT DETECTED
Tetrahydrocannabinol: NOT DETECTED

## 2017-03-23 LAB — CBC WITH DIFFERENTIAL/PLATELET
BASOS ABS: 0 10*3/uL (ref 0.0–0.1)
Basophils Relative: 0 %
Eosinophils Absolute: 0 10*3/uL (ref 0.0–0.7)
Eosinophils Relative: 1 %
HEMATOCRIT: 39 % (ref 39.0–52.0)
Hemoglobin: 13.7 g/dL (ref 13.0–17.0)
LYMPHS PCT: 29 %
Lymphs Abs: 1.4 10*3/uL (ref 0.7–4.0)
MCH: 34.9 pg — ABNORMAL HIGH (ref 26.0–34.0)
MCHC: 35.1 g/dL (ref 30.0–36.0)
MCV: 99.2 fL (ref 78.0–100.0)
MONO ABS: 0.4 10*3/uL (ref 0.1–1.0)
Monocytes Relative: 8 %
NEUTROS ABS: 3 10*3/uL (ref 1.7–7.7)
Neutrophils Relative %: 62 %
Platelets: 71 10*3/uL — ABNORMAL LOW (ref 150–400)
RBC: 3.93 MIL/uL — ABNORMAL LOW (ref 4.22–5.81)
RDW: 13.2 % (ref 11.5–15.5)
WBC: 4.8 10*3/uL (ref 4.0–10.5)

## 2017-03-23 LAB — ETHANOL
ALCOHOL ETHYL (B): 428 mg/dL — AB (ref <10)
ALCOHOL ETHYL (B): 314 mg/dL — AB (ref <10)

## 2017-03-23 LAB — ACETAMINOPHEN LEVEL: Acetaminophen (Tylenol), Serum: <10 ug/mL — ABNORMAL LOW (ref 10–30)

## 2017-03-23 LAB — SALICYLATE LEVEL: Salicylate Lvl: <7 mg/dL (ref 2.8–30.0)

## 2017-03-23 MED ORDER — THIAMINE HCL 100 MG/ML IJ SOLN: 100.0000 mg | Freq: Every day | INTRAMUSCULAR | Status: DC

## 2017-03-23 MED ORDER — ONDANSETRON HCL 4 MG PO TABS: 4.0000 mg | ORAL_TABLET | Freq: Three times a day (TID) | ORAL | Status: DC | PRN

## 2017-03-23 MED ORDER — LORAZEPAM 1 MG PO TABS
0.0000 mg | ORAL_TABLET | Freq: Four times a day (QID) | ORAL | Status: DC
  Administered 2017-03-23 – 2017-03-24 (×3): 1 mg via ORAL
  Filled 2017-03-23 (×3): qty 1

## 2017-03-23 MED ORDER — ALUM & MAG HYDROXIDE-SIMETH 200-200-20 MG/5ML PO SUSP: 30.0000 mL | Freq: Four times a day (QID) | ORAL | Status: DC | PRN

## 2017-03-23 MED ORDER — VITAMIN B-1 100 MG PO TABS
100.0000 mg | ORAL_TABLET | Freq: Every day | ORAL | Status: DC
  Administered 2017-03-23 – 2017-03-24 (×2): 100 mg via ORAL
  Filled 2017-03-23 (×3): qty 1

## 2017-03-23 MED ORDER — IBUPROFEN 200 MG PO TABS: 600.0000 mg | ORAL_TABLET | Freq: Three times a day (TID) | ORAL | Status: DC | PRN

## 2017-03-23 MED ORDER — LORAZEPAM 1 MG PO TABS: 0.0000 mg | ORAL_TABLET | Freq: Two times a day (BID) | ORAL | Status: DC

## 2017-03-23 MED ORDER — NICOTINE 21 MG/24HR TD PT24: 21.0000 mg | MEDICATED_PATCH | Freq: Every day | TRANSDERMAL | Status: DC | PRN

## 2017-03-23 MED ORDER — LORAZEPAM 2 MG/ML IJ SOLN: 0.0000 mg | Freq: Two times a day (BID) | INTRAMUSCULAR | Status: DC

## 2017-03-23 MED ORDER — LORAZEPAM 2 MG/ML IJ SOLN: 0.0000 mg | Freq: Four times a day (QID) | INTRAMUSCULAR | Status: DC

## 2017-03-23 NOTE — ED Notes (Addendum)
Pt stated "I'm worried about my 54 y/o mother.  [place]  I have friends and neighbors that take care of her.  I quit drinking yesterday.  I'm never going to buy any more".

## 2017-03-23 NOTE — ED Notes (Signed)
Bed: WA28 Expected date:  Expected time:  Means of arrival:  Comments: Triage 4  

## 2017-03-23 NOTE — BH Assessment (Signed)
Assessment Note  [name] is an 52 y.o. male. Pt was brought in by [employer] due to a DUI. Pt denies DUI. Pt's alcohol level was 428. Pt admits to alcohol abuse. Pt denies SI/HI/AVH. Epic notes report that the Pt stated that he was suicidal to [employer]. The Pt denied SI and states that he does not remember stating SI. Pt denies previous or current mental health treatment. Pt denies mental health medication. Pt states he is in of alcohol detox and rehabilitation. Pt denies other drug use and abuse.  Sh[place]on, NP recommends am psych evaluation.   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 Abdominal aneurysm [facility])    Patient reported   . Chronic kidney disease, stage III (moderate) (HCC)    Deterding  . Hypertension   . Obesity (BMI 30-39.9)   . Other and unspecified hyperlipidemia   . Type II or unspecified type diabetes mellitus without mention of complication, not stated as uncontrolled     Past Surgical History:  Procedure Laterality Date  . stomach vessel aneurysm     Patient reported     Family History:  Family History  Problem Relation Age of Onset  . Diabetes Mellitus II Mother     Social History:  reports that he is a non-smoker but has been exposed to tobacco smoke. he has never used smokeless tobacco. He reports that he drinks about 7.2 oz of alcohol per week. He reports that he does not use drugs.  Additional Social History:  Alcohol / Drug Use Pain Medications: please see mar Prescriptions: please see mar Over the Counter: please see mar History of alcohol / drug use?: Yes Longest period of sobriety (when/how long): unknown Negative Consequences of Use: Financial, Legal, Personal relationships, Work / School Withdrawal Symptoms: Cramps, Agitation, Tingling, Nausea / Vomiting, Tremors, Diarrhea Substance #1 Name of Substance 1: alcohol 1 - Age of First Use: unknown 1 - Amount (size/oz): Pt reports "more  than I should" 1 - Frequency: daily 1 - Duration: ongoing 1 - Last Use / Amount: 03/23/16  CIWA: CIWA-Ar BP: 115/80 Pulse Rate: 76 Nausea and Vomiting: no nausea and no vomiting Tactile Disturbances: none Tremor: no tremor Auditory Disturbances: not present Paroxysmal Sweats: no sweat visible Visual Disturbances: not present Anxiety: no anxiety, at ease Headache, Fullness in Head: none present Agitation: normal activity Orientation and Clouding of Sensorium: oriented and can do serial additions CIWA-Ar Total: 0 COWS:    Allergies: No Known Allergies  Home Medications:  (Not in a hospital admission)  OB/GYN Status:  No LMP for male patient.  General Assessment Data Location of Assessment: [facility] TTS Assessment: In system Is this a Tele or Face-to-Face Assessment?: Face-to-Face Is this an Initial Assessment or a Re-assessment for this encounter?: Initial Assessment Marital status: Separated Maiden name: NA Is patient pregnant?: No Pregnancy Status: No Living Arrangements: Alone Can pt return to current living arrangement?: Yes Admission Status: Voluntary Is patient capable of signing voluntary admission?: Yes Referral Source: Self/Family/Friend Insurance type: [place]     Crisis Care Plan Living Arrangements: Alone Legal Guardian: Other:(self) Name of Psychiatrist: NA Name of Therapist: NA  Education Status Is patient currently in school?: No Current Grade: NA Highest grade of school patient has completed: [employer] Name of school: NA Contact person: NA  Risk to self with the past 6 months Suicidal Ideation: No Has patient been a risk to self within the past 6 months  prior to admission? : No Suicidal Intent: No Has patient had any suicidal intent within the past 6 months prior to admission? : No Is patient at risk for suicide?: No Suicidal Plan?: No Has patient had any suicidal plan within the past 6 months prior to admission? : No Access to Means:  No What has been your use of drugs/alcohol within the last 12 months?: NA Previous Attempts/Gestures: No How many times?: 0 Other Self Harm Risks: NA Triggers for Past Attempts: None known Intentional Self Injurious Behavior: None Family Suicide History: No Recent stressful life event(s): Other (Comment)(alcoholl) Persecutory voices/beliefs?: No Depression: No Depression Symptoms: (pt denies) Substance abuse history and/or treatment for substance abuse?: Yes Suicide prevention information given to non-admitted patients: Not applicable  Risk to Others within the past 6 months Homicidal Ideation: No Does patient have any lifetime risk of violence toward others beyond the six months prior to admission? : No Thoughts of Harm to Others: No Current Homicidal Intent: No Current Homicidal Plan: No Access to Homicidal Means: No Identified Victim: NA History of harm to others?: No Assessment of Violence: None Noted Violent Behavior Description: NA Does patient have access to weapons?: No Criminal Charges Pending?: No Does patient have a court date: No Is patient on probation?: No  Psychosis Hallucinations: None noted Delusions: None noted  Mental Status Report Appearance/Hygiene: Unremarkable Eye Contact: Fair Motor Activity: Freedom of movement Speech: Logical/coherent Level of Consciousness: Alert Mood: Anxious Affect: Anxious Anxiety Level: Minimal Thought Processes: Coherent, Relevant Judgement: Impaired Orientation: Person, Place, Time, Situation Obsessive Compulsive Thoughts/Behaviors: None  Cognitive Functioning Concentration: Normal Memory: Recent Intact, Remote Intact IQ: Average Insight: Fair Impulse Control: Poor Appetite: Poor Weight Loss: 0 Weight Gain: 0 Sleep: Decreased Total Hours of Sleep: 6 Vegetative Symptoms: None  ADLScreening [facility] Assessment Services) Patient's cognitive ability adequate to safely complete daily activities?: Yes Patient able  to express need for assistance with ADLs?: Yes Independently performs ADLs?: Yes (appropriate for developmental age)  Prior Inpatient Therapy Prior Inpatient Therapy: No Prior Therapy Dates: Na Prior Therapy Facilty/Provider(s): nA Reason for Treatment: na  Prior Outpatient Therapy Prior Outpatient Therapy: No Prior Therapy Dates: NA Prior Therapy Facilty/Provider(s): NA Reason for Treatment: nA Does patient have an ACCT team?: No Does patient have Intensive In-House Services?  : No Does patient have [facility] services? : No Does patient have P4CC services?: No  ADL Screening (condition at time of admission) Patient's cognitive ability adequate to safely complete daily activities?: Yes Is the patient deaf or have difficulty hearing?: No Does the patient have difficulty seeing, even when wearing glasses/contacts?: No Does the patient have difficulty concentrating, remembering, or making decisions?: No Patient able to express need for assistance with ADLs?: Yes Does the patient have difficulty dressing or bathing?: No Independently performs ADLs?: Yes (appropriate for developmental age) Does the patient have difficulty walking or climbing stairs?: No Weakness of Legs: None Weakness of Arms/Hands: None       Abuse/Neglect Assessment (Assessment to be complete while patient is alone) Abuse/Neglect Assessment Can Be Completed: Yes Physical Abuse: Denies Verbal Abuse: Denies Sexual Abuse: Denies Exploitation of patient/patient's resources: Denies Self-Neglect: Denies     Merchant navy officerAdvance Directives (For Healthcare) Does Patient Have a Medical Advance Directive?: No Would patient like information on creating a medical advance directive?: No - Patient declined    Additional Information 1:1 In Past 12 Months?: No CIRT Risk: No Elopement Risk: No Does patient have medical clearance?: No     Disposition:  Disposition Initial  Assessment Completed for this Encounter:  Yes Disposition of Patient: Re-evaluation by Psychiatry recommended  On Site Evaluation by:   Reviewed with Physician:    Emmit Pomfret 03/23/2017 6:11 PM

## 2017-03-23 NOTE — ED Notes (Signed)
Pt intoxicated, served with DUI today, presents with SI, plan to blow his brains out.  Denies SI at present.  Denies HI or AVH.  Bruising to rt eye noted, pt reports he fell x 2 days ago.  Denies feeling hopeless.  Pt reports he wants to quit drinking cold [place],  A&O x 3, no distress noted, calm & cooperative.  Monitoring for safety, Q 15 min checks in effect.  Comfort measures given.

## 2017-03-23 NOTE — ED Notes (Signed)
Patient knife locked with security upon arrival. [place][id number]

## 2017-03-23 NOTE — ED Triage Notes (Signed)
Per [employer], was picked up by [employer] for DUI-while waiting for lab draw patient stated he was suicidal

## 2017-03-23 NOTE — ED Provider Notes (Signed)
[facility]-EMERGENCY DEPT Provider Note   CSN: [id number] Arrival date & time: 03/23/17  1418     History   Chief Complaint Chief Complaint  Patient presents with  . IVC  . Suicidal    HPI [name] is a 52 y.o. male brought in by [employer] after being charged with DUI for a blood draw.  While arriving at the emergency department he told the police that he should just kill himself.  He is currently extremely intoxicated.  He is unable to give me any kind of accurate history.  Please say this is the fourth time this month they have dealt with him driving a car while intoxicated.  Patient states that he never said that he was suicidal and does not know what I am talking about.  He also states that the police just came to his house and took him here and he does not know why.  HPI  Past Medical History:  Diagnosis Date  . Abdominal aneurysm [facility])    Patient reported   . Chronic kidney disease, stage III (moderate) (HCC)    Deterding  . Hypertension   . Obesity (BMI 30-39.9)   . Other and unspecified hyperlipidemia   . Type II or unspecified type diabetes mellitus without mention of complication, not stated as uncontrolled     Patient Active Problem List   Diagnosis Date Noted  . Frontal lobe contusion (HCC) 01/23/2017  . Encephalopathy   . Fall   . Subarachnoid hemorrhage following injury with brief loss of consciousness but without open intracranial wound (HCC) 07/17/2016  . Traumatic subarachnoid hemorrhage (HCC) 01/15/2015  . Alcohol intoxication (HCC) 01/15/2015  . SAH (subarachnoid hemorrhage) (HCC) 01/15/2015    Past Surgical History:  Procedure Laterality Date  . stomach vessel aneurysm     Patient reported        Home Medications    Prior to Admission medications   Medication Sig Start Date End Date Taking? Authorizing Provider  folic acid (FOLVITE) 1 MG tablet Take 1 tablet (1 mg total) by mouth daily. Patient not taking: Reported  on 03/23/2017 01/16/15   Alison Murray, MD  levETIRAcetam (KEPPRA) 500 MG tablet Take 1 tablet (500 mg total) by mouth 2 (two) times daily. Patient not taking: Reported on 03/23/2017 07/18/16   Wendee Beavers, DO  metFORMIN (GLUCOPHAGE) 500 MG tablet Take 1 tablet (500 mg total) by mouth 2 (two) times daily with a meal. Patient not taking: Reported on 03/23/2017 01/16/15   Alison Murray, MD  thiamine 100 MG tablet Take 1 tablet (100 mg total) by mouth daily. Patient not taking: Reported on 07/17/2016 01/16/15   Alison Murray, MD    Family History Family History  Problem Relation Age of Onset  . Diabetes Mellitus II Mother     Social History Social History   Tobacco Use  . Smoking status: Passive Smoke Exposure - Never Smoker  . Smokeless tobacco: Never Used  Substance Use Topics  . Alcohol use: Yes    Alcohol/week: 7.2 oz    Types: 12 Cans of beer per week  . Drug use: No     Allergies   Patient has no known allergies.   Review of Systems Review of Systems  Unable to perform ROS: Mental status change  Neurological: Negative for numbness.     Physical Exam Updated Vital Signs BP (!) 155/77 (BP Location: Right Arm) Comment: patient kept moving his arm  Pulse 96  Temp 98.3 F (36.8 C) (Oral)   Resp 20   SpO2 98%   Physical Exam  Constitutional: He appears well-developed and well-nourished. No distress.  HENT:  Head: Normocephalic and atraumatic.  Old bruising around the R eye  Eyes: Conjunctivae are normal. No scleral icterus.  Neck: Normal range of motion. Neck supple.  Cardiovascular: Normal rate, regular rhythm and normal heart sounds.  Pulmonary/Chest: Effort normal and breath sounds normal. No respiratory distress.  Abdominal: Soft. There is no tenderness.  Musculoskeletal: He exhibits no edema.  Neurological: He is alert.  Skin: Skin is warm and dry. He is not diaphoretic.  Psychiatric: His behavior is normal.  drunk  Nursing note and vitals  reviewed.    ED Treatments / Results  Labs (all labs ordered are listed, but only abnormal results are displayed) Labs Reviewed  COMPREHENSIVE METABOLIC PANEL - Abnormal; Notable for the following components:      Result Value   Sodium 134 (*)    Potassium 3.4 (*)    Glucose, Bld 213 (*)    Calcium 8.1 (*)    Albumin 3.4 (*)    AST 109 (*)    Alkaline Phosphatase 185 (*)    Total Bilirubin 1.6 (*)    All other components within normal limits  ETHANOL - Abnormal; Notable for the following components:   Alcohol, Ethyl (B) 428 (*)    All other components within normal limits  CBC WITH DIFFERENTIAL/PLATELET - Abnormal; Notable for the following components:   RBC 3.93 (*)    MCH 34.9 (*)    Platelets 71 (*)    All other components within normal limits  ACETAMINOPHEN LEVEL - Abnormal; Notable for the following components:   Acetaminophen (Tylenol), Serum <10 (*)    All other components within normal limits  ETHANOL - Abnormal; Notable for the following components:   Alcohol, Ethyl (B) 314 (*)    All other components within normal limits  RAPID URINE DRUG SCREEN, HOSP PERFORMED  SALICYLATE LEVEL  ETHANOL    EKG  EKG Interpretation None       Radiology No results found.  Procedures Procedures (including critical care time)  Medications Ordered in ED Medications  LORazepam (ATIVAN) injection 0-4 mg ( Intravenous See Alternative 03/23/17 2151)    Or  LORazepam (ATIVAN) tablet 0-4 mg (1 mg Oral Given 03/23/17 2151)  LORazepam (ATIVAN) injection 0-4 mg (not administered)    Or  LORazepam (ATIVAN) tablet 0-4 mg (not administered)  thiamine (VITAMIN B-1) tablet 100 mg (100 mg Oral Given 03/23/17 1656)    Or  thiamine (B-1) injection 100 mg ( Intravenous See Alternative 03/23/17 1656)  nicotine (NICODERM CQ - dosed in mg/24 hours) patch 21 mg (21 mg Transdermal Refused 03/23/17 2146)  alum & mag hydroxide-simeth (MAALOX/MYLANTA) 200-200-20 MG/5ML suspension 30 mL (not  administered)  ibuprofen (ADVIL,MOTRIN) tablet 600 mg (not administered)  ondansetron (ZOFRAN) tablet 4 mg (not administered)     Initial Impression / Assessment and Plan / ED Course  I have reviewed the triage vital signs and the nursing notes.  Pertinent labs & imaging results that were available during my care of the patient were reviewed by me and considered in my medical decision making (see chart for details).  Clinical Course as of Mar 25 119  Wed Mar 23, 2017  2158 PATIENT IS STILL CLINICALLY INEBRIATED AND WILL NEED MORE TIME BEFORE TTS EVAL. Alcohol, Ethyl (B): (!!) 314 [AH]    Clinical Course User Index [AH] Tiburcio PeaHarris,  Cammy Copa, PA-C    Patient still awaiting clinical sobriety for TTS evaluation. Patient third alcohol level being drawn.  I have given sign out to PA Muthersbaugh who will order TTS consultation when she has reached at the alcohol of 200.  Final Clinical Impressions(s) / ED Diagnoses   Final diagnoses:  Alcoholic intoxication without complication [facility])  Suicidal ideations  Involuntary commitment    ED Discharge Orders    None       Arthor Captain, PA-C 03/24/17 0121    Bethann Berkshire, MD 03/24/17 2332

## 2017-03-24 DIAGNOSIS — F10920 Alcohol use, unspecified with intoxication, uncomplicated: Secondary | ICD-10-CM

## 2017-03-24 DIAGNOSIS — F1014 Alcohol abuse with alcohol-induced mood disorder: Secondary | ICD-10-CM | POA: Diagnosis present

## 2017-03-24 LAB — ETHANOL: ALCOHOL ETHYL (B): 207 mg/dL — AB (ref <10)

## 2017-03-24 MED ORDER — CEPHALEXIN 500 MG PO CAPS
500.0000 mg | ORAL_CAPSULE | Freq: Three times a day (TID) | ORAL | Status: DC
  Administered 2017-03-24: 500 mg via ORAL
  Filled 2017-03-24: qty 1

## 2017-03-24 MED ORDER — GABAPENTIN 300 MG PO CAPS: 300.0000 mg | ORAL_CAPSULE | Freq: Three times a day (TID) | ORAL | 0 refills | Status: DC

## 2017-03-24 MED ORDER — GABAPENTIN 300 MG PO CAPS
300.0000 mg | ORAL_CAPSULE | Freq: Three times a day (TID) | ORAL | Status: DC
  Administered 2017-03-24: 300 mg via ORAL
  Filled 2017-03-24: qty 1

## 2017-03-24 MED ORDER — CEPHALEXIN 500 MG PO CAPS: 500.0000 mg | ORAL_CAPSULE | Freq: Three times a day (TID) | ORAL | 0 refills | Status: DC

## 2017-03-24 NOTE — ED Notes (Signed)
Pt discharged safely after seeing peer support.  Pt was in no distress.  RX and discharge instructions were reviewed with patient.  All belongings were returned to pt.  Pt states he is missing a phone but no phone was listed on initial belonging sheet.

## 2017-03-24 NOTE — Consult Note (Signed)
[facility] Consult   Reason for Consult:  Alcohol intoxication with suicidal ideations Referring Physician:  EDP Patient Identification: Terrance Usery MRN:  [id number] Principal Diagnosis: Alcohol abuse with alcohol-induced mood disorder [facility]) Diagnosis:   Patient Active Problem List   Diagnosis Date Noted  . Alcohol abuse with alcohol-induced mood disorder ([place]) [F10.14] 03/24/2017    Priority: High  . Frontal lobe contusion ([place]) [S06.339A] 01/23/2017  . Encephalopathy [G93.40]   . Fall [W19.XXXA]   . Subarachnoid hemorrhage following injury with brief loss of consciousness but without open intracranial wound ([place]) [S06.6X9A] 07/17/2016  . Traumatic subarachnoid hemorrhage ([place]) [S06.6X9A] 01/15/2015  . Alcohol intoxication ([place]) [F10.929] 01/15/2015  . SAH (subarachnoid hemorrhage) ([place]) [I60.9] 01/15/2015    Total Time spent with patient: 45 minutes  Subjective:   Arne Schlender is a 52 y.o. male patient does not warrant admission.  HPI:  52 yo male who presented to the ED under the influence of alcohol with suicidal ideations to [employer].  On arrival to the ED and other assessments, he denies suicidal ideations and past attempts.  No homicidal ideations, hallucinations, or withdrawal symptoms.  He is interested in [place] and agreeable to meet with Peer Support for resources.  Reports "some" depression and agreeable to go to counseling.  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  Stable for discharge.  Past Psychiatric History: Alcohol abuse  Risk to Self: Suicidal Ideation: No Suicidal Intent: No Is patient at risk for suicide?: No Suicidal Plan?: No Access to Means: No What has been your use of drugs/alcohol within the last 12 months?: NA How many times?: 0 Other Self Harm Risks: NA Triggers for  Past Attempts: None known Intentional Self Injurious Behavior: None Risk to Others: Homicidal Ideation: No Thoughts of Harm to Others: No Current Homicidal Intent: No Current Homicidal Plan: No Access to Homicidal Means: No Identified Victim: NA History of harm to others?: No Assessment of Violence: None Noted Violent Behavior Description: NA Does patient have access to weapons?: No Criminal Charges Pending?: No Does patient have a court date: No Prior Inpatient Therapy: Prior Inpatient Therapy: No Prior Therapy Dates: Na Prior Therapy Facilty/Provider(s): nA Reason for Treatment: na Prior Outpatient Therapy: Prior Outpatient Therapy: No Prior Therapy Dates: NA Prior Therapy Facilty/Provider(s): NA Reason for Treatment: nA Does patient have an ACCT team?: No Does patient have Intensive In-House Services?  : No Does patient have [facility] services? : No Does patient have P4CC services?: No  Past Medical History:  Past Medical History:  Diagnosis Date  . Abdominal aneurysm [facility])    Patient reported   . Chronic kidney disease, stage III (moderate) (HCC)    Deterding  . Hypertension   . Obesity (BMI 30-39.9)   . Other and unspecified hyperlipidemia   . Type II or unspecified type diabetes mellitus without mention of complication, not stated as uncontrolled     Past Surgical History:  Procedure Laterality Date  . stomach vessel aneurysm     Patient reported    Family History:  Family History  Problem Relation Age of Onset  . Diabetes Mellitus II Mother    Family Psychiatric  History: none Social History:  Social History   Substance and Sexual Activity  Alcohol Use Yes  . Alcohol/week: 7.2 oz  . Types: 12 Cans of  beer per week     Social History   Substance and Sexual Activity  Drug Use No    Social History   Socioeconomic History  . Marital status: Married    Spouse name: None  . Number of children: None  . Years of education: None  . Highest education  level: None  Social Needs  . Financial resource strain: None  . Food insecurity - worry: None  . Food insecurity - inability: None  . Transportation needs - medical: None  . Transportation needs - non-medical: None  Occupational History  . None  Tobacco Use  . Smoking status: Passive Smoke Exposure - Never Smoker  . Smokeless tobacco: Never Used  Substance and Sexual Activity  . Alcohol use: Yes    Alcohol/week: 7.2 oz    Types: 12 Cans of beer per week  . Drug use: No  . Sexual activity: None  Other Topics Concern  . None  Social History Narrative  . None   Additional Social History: N/A    Allergies:  No Known Allergies  Labs:  Results for orders placed or performed during the hospital encounter of 03/23/17 (from the past 48 hour(s))  Ethanol     Status: Abnormal   Collection Time: 03/23/17  3:15 PM  Result Value Ref Range   Alcohol, Ethyl (B) 428 (HH) <10 mg/dL    Comment:        LOWEST DETECTABLE LIMIT FOR SERUM ALCOHOL IS 10 mg/dL FOR MEDICAL PURPOSES ONLY CRITICAL RESULT CALLED TO, READ BACK BY AND VERIFIED WITH: T SNIPES,RN [id number] @ 1607 BY J SCOTTON   Salicylate level     Status: None   Collection Time: 03/23/17  3:15 PM  Result Value Ref Range   Salicylate Lvl <2.6 2.8 - 30.0 mg/dL  Acetaminophen level     Status: Abnormal   Collection Time: 03/23/17  3:15 PM  Result Value Ref Range   Acetaminophen (Tylenol), Serum <10 (L) 10 - 30 ug/mL    Comment:        THERAPEUTIC CONCENTRATIONS VARY SIGNIFICANTLY. A RANGE OF 10-30 ug/mL MAY BE AN EFFECTIVE CONCENTRATION FOR MANY PATIENTS. HOWEVER, SOME ARE BEST TREATED AT CONCENTRATIONS OUTSIDE THIS RANGE. ACETAMINOPHEN CONCENTRATIONS >150 ug/mL AT 4 HOURS AFTER INGESTION AND >50 ug/mL AT 12 HOURS AFTER INGESTION ARE OFTEN ASSOCIATED WITH TOXIC REACTIONS.   Comprehensive metabolic panel     Status: Abnormal   Collection Time: 03/23/17  3:20 PM  Result Value Ref Range   Sodium 134 (L) 135 - 145 mmol/L    Potassium 3.4 (L) 3.5 - 5.1 mmol/L   Chloride 103 101 - 111 mmol/L   CO2 22 22 - 32 mmol/L   Glucose, Bld 213 (H) 65 - 99 mg/dL   BUN 6 6 - 20 mg/dL   Creatinine, Ser 0.73 0.61 - 1.24 mg/dL   Calcium 8.1 (L) 8.9 - 10.3 mg/dL   Total Protein 8.1 6.5 - 8.1 g/dL   Albumin 3.4 (L) 3.5 - 5.0 g/dL   AST 109 (H) 15 - 41 U/L   ALT 38 17 - 63 U/L   Alkaline Phosphatase 185 (H) 38 - 126 U/L   Total Bilirubin 1.6 (H) 0.3 - 1.2 mg/dL   GFR calc non Af Amer >60 >60 mL/min   GFR calc Af Amer >60 >60 mL/min    Comment: (NOTE) The eGFR has been calculated using the CKD EPI equation. This calculation has not been validated in all clinical situations. eGFR's persistently <60 mL/min  signify possible Chronic Kidney Disease.    Anion gap 9 5 - 15  CBC with Diff     Status: Abnormal   Collection Time: 03/23/17  3:20 PM  Result Value Ref Range   WBC 4.8 4.0 - 10.5 K/uL   RBC 3.93 (L) 4.22 - 5.81 MIL/uL   Hemoglobin 13.7 13.0 - 17.0 g/dL   HCT 39.0 39.0 - 52.0 %   MCV 99.2 78.0 - 100.0 fL   MCH 34.9 (H) 26.0 - 34.0 pg   MCHC 35.1 30.0 - 36.0 g/dL   RDW 13.2 11.5 - 15.5 %   Platelets 71 (L) 150 - 400 K/uL    Comment: SPECIMEN CHECKED FOR CLOTS REPEATED TO VERIFY PLATELET COUNT CONFIRMED BY SMEAR    Neutrophils Relative % 62 %   Neutro Abs 3.0 1.7 - 7.7 K/uL   Lymphocytes Relative 29 %   Lymphs Abs 1.4 0.7 - 4.0 K/uL   Monocytes Relative 8 %   Monocytes Absolute 0.4 0.1 - 1.0 K/uL   Eosinophils Relative 1 %   Eosinophils Absolute 0.0 0.0 - 0.7 K/uL   Basophils Relative 0 %   Basophils Absolute 0.0 0.0 - 0.1 K/uL  Ethanol     Status: Abnormal   Collection Time: 03/23/17  8:42 PM  Result Value Ref Range   Alcohol, Ethyl (B) 314 (HH) <10 mg/dL    Comment:        LOWEST DETECTABLE LIMIT FOR SERUM ALCOHOL IS 10 mg/dL FOR MEDICAL PURPOSES ONLY CRITICAL RESULT CALLED TO, READ BACK BY AND VERIFIED WITH: [place] [id number] @ 2138 BY J SCOTTON   Urine rapid drug screen (hosp performed)      Status: None   Collection Time: 03/23/17  9:26 PM  Result Value Ref Range   Opiates NONE DETECTED NONE DETECTED   Cocaine NONE DETECTED NONE DETECTED   Benzodiazepines NONE DETECTED NONE DETECTED   Amphetamines NONE DETECTED NONE DETECTED   Tetrahydrocannabinol NONE DETECTED NONE DETECTED   Barbiturates NONE DETECTED NONE DETECTED    Comment: (NOTE) DRUG SCREEN FOR MEDICAL PURPOSES ONLY.  IF CONFIRMATION IS NEEDED FOR ANY PURPOSE, NOTIFY LAB WITHIN 5 DAYS. LOWEST DETECTABLE LIMITS FOR URINE DRUG SCREEN Drug Class                     Cutoff (ng/mL) Amphetamine and metabolites    1000 Barbiturate and metabolites    200 Benzodiazepine                 465 Tricyclics and metabolites     300 Opiates and metabolites        300 Cocaine and metabolites        300 THC                            50   Ethanol     Status: Abnormal   Collection Time: 03/24/17  1:30 AM  Result Value Ref Range   Alcohol, Ethyl (B) 207 (H) <10 mg/dL    Comment:        LOWEST DETECTABLE LIMIT FOR SERUM ALCOHOL IS 10 mg/dL FOR MEDICAL PURPOSES ONLY     Current Facility-Administered Medications  Medication Dose Route Frequency Provider Last Rate Last Dose  . alum & mag hydroxide-simeth (MAALOX/MYLANTA) 200-200-20 MG/5ML suspension 30 mL  30 mL Oral Q6H PRN Harris, Abigail, PA-C      . cephALEXin (KEFLEX) capsule 500 mg  500 mg  Oral Q8H Lord, Asa Saunas, NP      . ibuprofen (ADVIL,MOTRIN) tablet 600 mg  600 mg Oral Q8H PRN Margarita Mail, PA-C      . LORazepam (ATIVAN) injection 0-4 mg  0-4 mg Intravenous Q6H Harris, Abigail, PA-C       Or  . LORazepam (ATIVAN) tablet 0-4 mg  0-4 mg Oral Q6H Harris, Abigail, PA-C   1 mg at 03/24/17 0647  . [START ON 03/26/2017] LORazepam (ATIVAN) injection 0-4 mg  0-4 mg Intravenous Q12H Harris, Abigail, PA-C       Or  . Derrill Memo ON 03/26/2017] LORazepam (ATIVAN) tablet 0-4 mg  0-4 mg Oral Q12H Harris, Abigail, PA-C      . nicotine (NICODERM CQ - dosed in mg/24 hours) patch 21  mg  21 mg Transdermal Daily PRN Harris, Abigail, PA-C      . ondansetron (ZOFRAN) tablet 4 mg  4 mg Oral Q8H PRN Harris, Abigail, PA-C      . thiamine (VITAMIN B-1) tablet 100 mg  100 mg Oral Daily Harris, Abigail, PA-C   100 mg at 03/23/17 1656   Or  . thiamine (B-1) injection 100 mg  100 mg Intravenous Daily Margarita Mail, PA-C       Current Outpatient Medications  Medication Sig Dispense Refill  . folic acid (FOLVITE) 1 MG tablet Take 1 tablet (1 mg total) by mouth daily. (Patient not taking: Reported on 03/23/2017) 30 tablet 0  . levETIRAcetam (KEPPRA) 500 MG tablet Take 1 tablet (500 mg total) by mouth 2 (two) times daily. (Patient not taking: Reported on 03/23/2017) 10 tablet 0  . metFORMIN (GLUCOPHAGE) 500 MG tablet Take 1 tablet (500 mg total) by mouth 2 (two) times daily with a meal. (Patient not taking: Reported on 03/23/2017) 60 tablet 0  . thiamine 100 MG tablet Take 1 tablet (100 mg total) by mouth daily. (Patient not taking: Reported on 07/17/2016) 30 tablet 0    Musculoskeletal: Strength & Muscle Tone: within normal limits Gait & Station: normal Patient leans: N/A  Psychiatric Specialty Exam: Physical Exam  Nursing note and vitals reviewed. Constitutional: He is oriented to person, place, and time. He appears well-developed and well-nourished.  HENT:  Head: Normocephalic.  Neck: Normal range of motion.  Respiratory: Effort normal.  Musculoskeletal: Normal range of motion.  Neurological: He is alert and oriented to person, place, and time.  Psychiatric: His speech is normal and behavior is normal. Judgment and thought content normal. Cognition and memory are normal. He exhibits a depressed mood.    Review of Systems  Psychiatric/Behavioral: Positive for depression and substance abuse.  All other systems reviewed and are negative.   Blood pressure (!) 153/97, pulse (!) 118, temperature 99.6 F (37.6 C), temperature source Oral, resp. rate 20, SpO2 98 %.There is no height  or weight on file to calculate BMI.  General Appearance: Casual  Eye Contact:  Good  Speech:  Normal Rate  Volume:  Normal  Mood:  Depressed  Affect:  Congruent  Thought Process:  Coherent and Descriptions of Associations: Intact  Orientation:  Full (Time, Place, and Person)  Thought Content:  WDL and Logical  Suicidal Thoughts:  No  Homicidal Thoughts:  No  Memory:  Immediate;   Good Recent;   Good Remote;   Good  Judgement:  Fair  Insight:  Fair  Psychomotor Activity:  Normal  Concentration:  Concentration: Good and Attention Span: Good  Recall:  Good  Fund of Knowledge:  Fair  Language:  Good  Akathisia:  No  Handed:  Right  AIMS (if indicated):   N/A  Assets:  Housing Leisure Time Physical Health Resilience Social Support  ADL's:  Intact  Cognition:  WNL  Sleep:   N/A     Treatment Plan Summary: Daily contact with patient to assess and evaluate symptoms and progress in treatment, Medication management and Plan alcohol abuse with alcohol induced mood disorder:  -Crisis stabilization -Medication management:  Ativan alcohol detox protocol started and Gabapentin 300 mg TID for alcohol withdrawal -Individual and substance abuse counseling  Disposition: No evidence of imminent risk to self or others at present.    Waylan Boga, NP 03/24/2017 9:59 AM   Patient seen face-to-face for psychiatric evaluation, chart reviewed and case discussed with the physician extender and developed treatment plan. Reviewed the information documented and agree with the treatment plan.  Buford Dresser, DO

## 2017-03-24 NOTE — BH Assessment (Signed)
[facility]  Per Juanetta BeetsJacqueline Norman, DO, this pt does not require psychiatric hospitalization at this time.  Pt presents under IVC initiated by EDP Bethann BerkshireJoseph Zammit, MD, which Dr Sharma CovertNorman has rescinded.  [facility] with [facility].  This has been included in pt's discharge instructions.  Pt would also benefit from seeing Peer Support Specialists; they will be asked to speak to pt.  Pt's nurse, Kendal Hymendie, has been notified.  Doylene Canninghomas Jai Steil, MA Triage Specialist [phone]

## 2017-03-24 NOTE — Patient Outreach (Signed)
ED Peer Support Specialist Patient Intake (Complete at intake & 30-60 Day Follow-up)  Name: Christopher ReichertManuel Shaffer  MRN: [id number]  Age: 52 y.o.   Date of Admission: 03/24/2017  Intake: Initial Comments:      Primary Reason Admitted: alcohol intoxication, alcohol withdrawal  Lab values: Alcohol/ETOH: Positive Positive UDS? 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 Yes  Number of ED visits: 3  Reason(s) for visit: alcohol intoxication  In past year, has patient been hospitalized for any reason? No  Number of hospitalizations:    Reason(s) for hospitalization:    In past year, has patient been arrested? No  Number of arrests:    Reason(s) for arrest:    In past year, has patient been incarcerated? No  Number of incarcerations:    Reason(s) for incarceration:    In past year, has patient received medication-assisted treatment? No  In past year, patient received the following treatments:    In past year, has patient received any harm reduction services? No  Did this include any of the following?    In past year, has patient received care from a mental health provider for diagnosis other than SUD? No  In past year, is this first time patient has overdosed? (has not overdosed )  Number of past overdoses:    In past year, is this first time patient has been hospitalized for an overdose? (has not overdosed)  Number of hospitalizations for overdose(s):    Is patient  currently receiving treatment for a mental health diagnosis? No  Patient reports experiencing difficulty participating in SUD treatment: No    Most important reason(s) for this difficulty?    Has patient received prior services for treatment? No  In past, patient has received services from following agencies:    Plan of Care:  Suggested follow up at these agencies/treatment centers: (Patient is interested in outpatient treatment because of his work schedule. Patient also interested in attneding AA meetings and [facility] provided a meeting [place] [facility].)  Other information: [facility] also provided [facility] contact information. [facility] encouraged the patient to contact [facility] at any time for substance use recovery support.    Bartholomew BoardsJohn Massie Mees, CPSS  03/24/2017 12:06 PM

## 2017-03-24 NOTE — ED Provider Notes (Signed)
Care assumed from Garden PrairieAbigail Harris, [place]PA-C.  Please see her full H&P.  Christopher ReichertManuel Herbert is a 52 y.o. male presents intoxicated.  At some point he threatened suicide and was ultimately IVC'd here as he was attempting to leave.  Pt will need to sober before TTS.   Physical Exam  BP (!) 155/77 (BP Location: Right Arm) Comment: patient kept moving his arm  Pulse 96   Temp 98.3 F (36.8 C) (Oral)   Resp 20   SpO2 98%   Physical Exam  Constitutional: He appears well-developed and well-nourished. No distress.  HENT:  Head: Normocephalic.  Eyes: Conjunctivae are normal. No scleral icterus.  Neck: Normal range of motion.  Cardiovascular: Normal rate.  Pulmonary/Chest: Effort normal.  Musculoskeletal: Normal range of motion.  Neurological: He is alert.  Skin: Skin is warm and dry.  Nursing note and vitals reviewed.   ED Course/Procedures   Clinical Course as of Mar 25 399  Wed Mar 23, 2017  2158 PATIENT IS STILL CLINICALLY INEBRIATED AND WILL NEED MORE TIME BEFORE TTS EVAL. Alcohol, Ethyl (B): (!!) 314 [AH]    Clinical Course User Index [AH] Arthor CaptainHarris, Abigail, PA-C    Procedures  MDM   7:10 AM Pt is awake, alert and oriented.  He was evaluated by TTS who will reevaluate in the morning.  He is clinically sober at this time.  At this time, patient until he is cleared by psychiatry.   Alcoholic intoxication without complication [employer] [facility](HCC)  Suicidal ideations  Involuntary commitment '   Juliene Kirsh, Boyd KerbsHannah, PA-C 03/24/17 [id number]    Zadie RhineWickline, Donald, MD 03/24/17 [phone]

## 2017-03-24 NOTE — [facility] Suicide Risk Assessment (Signed)
Suicide Risk Assessment  Discharge Assessment   [facility]   Principal Problem: Alcohol abuse with alcohol-induced mood disorder [facility](HCC) Discharge Diagnoses:  Patient Active Problem List   Diagnosis Date Noted  . Alcohol abuse with alcohol-induced mood disorder (HCC) [F10.14] 03/24/2017    Priority: High  . Frontal lobe contusion (HCC) [S06.339A] 01/23/2017  . Encephalopathy [G93.40]   . Fall [W19.XXXA]   . Subarachnoid hemorrhage following injury with brief loss of consciousness but without open intracranial wound (HCC) [S06.6X9A] 07/17/2016  . Traumatic subarachnoid hemorrhage (HCC) [S06.6X9A] 01/15/2015  . Alcohol intoxication (HCC) [F10.929] 01/15/2015  . SAH (subarachnoid hemorrhage) (HCC) [I60.9] 01/15/2015    Total Time spent with patient: 45 minutes  Musculoskeletal: Strength & Muscle Tone: within normal limits Gait & Station: normal Patient leans: N/A  Psychiatric Specialty Exam: Physical Exam  Constitutional: He is oriented to person, place, and time. He appears well-developed and well-nourished.  HENT:  Head: Normocephalic.  Neck: Normal range of motion.  Respiratory: Effort normal.  Musculoskeletal: Normal range of motion.  Neurological: He is alert and oriented to person, place, and time.  Psychiatric: His speech is normal and behavior is normal. Judgment and thought content normal. Cognition and memory are normal. He exhibits a depressed mood.    Review of Systems  Psychiatric/Behavioral: Positive for depression and substance abuse.  All other systems reviewed and are negative.   Blood pressure (!) 153/97, pulse (!) 118, temperature 99.6 F (37.6 C), temperature source Oral, resp. rate 20, SpO2 98 %.There is no height or weight on file to calculate BMI.  General Appearance: Casual  Eye Contact:  Good  Speech:  Normal Rate  Volume:  Normal  Mood:  Depressed  Affect:  Congruent  Thought Process:  Coherent and Descriptions of  Associations: Intact  Orientation:  Full (Time, Place, and Person)  Thought Content:  WDL and Logical  Suicidal Thoughts:  No  Homicidal Thoughts:  No  Memory:  Immediate;   Good Recent;   Good Remote;   Good  Judgement:  Fair  Insight:  Fair  Psychomotor Activity:  Normal  Concentration:  Concentration: Good and Attention Span: Good  Recall:  Good  Fund of Knowledge:  Fair  Language:  Good  Akathisia:  No  Handed:  Right  AIMS (if indicated):     Assets:  Housing Leisure Time Physical Health Resilience Social Support  ADL's:  Intact  Cognition:  WNL  Sleep:       Mental Status Per Nursing Assessment::   On Admission:   alcohol abuse with suicidal ideations  Demographic Factors:  Male  Loss Factors: NA  Historical Factors: NA  Risk Reduction Factors:   Sense of responsibility to family, Living with another person, especially a relative and Positive social support  Continued Clinical Symptoms:  None  Cognitive Features That Contribute To Risk:  None    Suicide Risk:  Minimal: No identifiable suicidal ideation.  Patients presenting with no risk factors but with morbid ruminations; may be classified as minimal risk based on the severity of the depressive symptoms    Plan Of Care/Follow-up recommendations:  Activity:  as tolerated Diet:  heart healthy diet  LORD, JAMISON, NP 03/24/2017, 10:30 AM

## 2017-03-24 NOTE — Discharge Instructions (Signed)
For your behavioral health needs, you are advised to follow up with [facility].  Contact them at your earliest opportunity to ask about enrolling in their program: ° °     [facility] °     [address] °     [place], [place] [address] °     [phone] °

## 2017-03-26 ENCOUNTER — Emergency Department (HOSPITAL_COMMUNITY)
Admission: EM | Admit: 2017-03-26 | Discharge: 2017-03-27 | Disposition: A | Discharge status: Home / Self Care | Payer: BLUE CROSS/BLUE SHIELD | Attending: Emergency Medicine | Admitting: Emergency Medicine

## 2017-03-26 DIAGNOSIS — R451 Restlessness and agitation: Secondary | ICD-10-CM | POA: Diagnosis present

## 2017-03-26 DIAGNOSIS — Z5321 Procedure and treatment not carried out due to patient leaving prior to being seen by health care provider: Secondary | ICD-10-CM | POA: Diagnosis not present

## 2017-03-26 NOTE — ED Triage Notes (Signed)
Pt is presented by medics, reportedly called to scene as patient was to intoxicated to ambulate. Unable to obtain any information from pt at this time, obviously obtunded with strong etoh odor.

## 2017-03-26 NOTE — ED Provider Notes (Signed)
Patient left without being seen. I went to patient room to see patient and no one was in the room. Nursing staff informed me that patient became very upset when he tried to recheck blood pressure. (Initial BP was 73/42). Per nursing staff, he refused BP recheck and stated that he wanted to go home. He walked out of department independently. I attempted to locate patient in waiting room with no success.    Tejay Hubert, Chase PicketJaime Pilcher, PA-C 03/26/17 1747    Charlynne PanderYao, David Hsienta, MD 03/26/17 [phone]

## 2017-03-26 NOTE — ED Notes (Signed)
Bed: [id number] Expected date: 03/26/17 Expected time: 4:12 PM Means of arrival: Ambulance Comments: ETOH just discharged this am

## 2017-03-26 NOTE — ED Notes (Signed)
Pt has declined care including a blood pressure recheck, he got agitated and asked to leave. He is pacing in the room and states he needs to leave. I have paged charge RN as pt walks out.

## 2017-04-03 ENCOUNTER — Emergency Department (HOSPITAL_COMMUNITY): Payer: BLUE CROSS/BLUE SHIELD

## 2017-04-03 ENCOUNTER — Other Ambulatory Visit: Payer: Self-pay

## 2017-04-03 ENCOUNTER — Encounter (HOSPITAL_COMMUNITY): Payer: Self-pay | Admitting: *Deleted

## 2017-04-03 ENCOUNTER — Inpatient Hospital Stay (HOSPITAL_COMMUNITY)
Admission: EM | Admit: 2017-04-03 | Discharge: 2017-04-12 | DRG: 896 | Disposition: A | Discharge status: Home / Self Care | Payer: BLUE CROSS/BLUE SHIELD | Attending: Internal Medicine | Admitting: Internal Medicine

## 2017-04-03 DIAGNOSIS — D696 Thrombocytopenia, unspecified: Secondary | ICD-10-CM | POA: Diagnosis present

## 2017-04-03 DIAGNOSIS — N183 Chronic kidney disease, stage 3 (moderate): Secondary | ICD-10-CM | POA: Diagnosis present

## 2017-04-03 DIAGNOSIS — R000 Tachycardia, unspecified: Secondary | ICD-10-CM

## 2017-04-03 DIAGNOSIS — F10239 Alcohol dependence with withdrawal, unspecified: Principal | ICD-10-CM | POA: Diagnosis present

## 2017-04-03 DIAGNOSIS — Z7984 Long term (current) use of oral hypoglycemic drugs: Secondary | ICD-10-CM

## 2017-04-03 DIAGNOSIS — E669 Obesity, unspecified: Secondary | ICD-10-CM | POA: Diagnosis present

## 2017-04-03 DIAGNOSIS — E119 Type 2 diabetes mellitus without complications: Secondary | ICD-10-CM

## 2017-04-03 DIAGNOSIS — E876 Hypokalemia: Secondary | ICD-10-CM | POA: Diagnosis present

## 2017-04-03 DIAGNOSIS — F10939 Alcohol use, unspecified with withdrawal, unspecified: Secondary | ICD-10-CM | POA: Diagnosis present

## 2017-04-03 DIAGNOSIS — R17 Unspecified jaundice: Secondary | ICD-10-CM | POA: Diagnosis present

## 2017-04-03 DIAGNOSIS — R569 Unspecified convulsions: Secondary | ICD-10-CM | POA: Diagnosis present

## 2017-04-03 DIAGNOSIS — R270 Ataxia, unspecified: Secondary | ICD-10-CM

## 2017-04-03 DIAGNOSIS — N179 Acute kidney failure, unspecified: Secondary | ICD-10-CM | POA: Diagnosis not present

## 2017-04-03 DIAGNOSIS — F329 Major depressive disorder, single episode, unspecified: Secondary | ICD-10-CM | POA: Diagnosis present

## 2017-04-03 DIAGNOSIS — G9340 Encephalopathy, unspecified: Secondary | ICD-10-CM

## 2017-04-03 DIAGNOSIS — Z79899 Other long term (current) drug therapy: Secondary | ICD-10-CM

## 2017-04-03 DIAGNOSIS — Z6831 Body mass index (BMI) 31.0-31.9, adult: Secondary | ICD-10-CM

## 2017-04-03 DIAGNOSIS — E1122 Type 2 diabetes mellitus with diabetic chronic kidney disease: Secondary | ICD-10-CM | POA: Diagnosis present

## 2017-04-03 DIAGNOSIS — I129 Hypertensive chronic kidney disease with stage 1 through stage 4 chronic kidney disease, or unspecified chronic kidney disease: Secondary | ICD-10-CM | POA: Diagnosis present

## 2017-04-03 DIAGNOSIS — W1830XA Fall on same level, unspecified, initial encounter: Secondary | ICD-10-CM | POA: Diagnosis present

## 2017-04-03 DIAGNOSIS — S81811A Laceration without foreign body, right lower leg, initial encounter: Secondary | ICD-10-CM | POA: Diagnosis present

## 2017-04-03 DIAGNOSIS — E860 Dehydration: Secondary | ICD-10-CM | POA: Diagnosis present

## 2017-04-03 DIAGNOSIS — G9341 Metabolic encephalopathy: Secondary | ICD-10-CM | POA: Diagnosis present

## 2017-04-03 DIAGNOSIS — R40241 Glasgow coma scale score 13-15, unspecified time: Secondary | ICD-10-CM | POA: Diagnosis present

## 2017-04-03 DIAGNOSIS — R81 Glycosuria: Secondary | ICD-10-CM | POA: Diagnosis present

## 2017-04-03 DIAGNOSIS — S0003XA Contusion of scalp, initial encounter: Secondary | ICD-10-CM | POA: Diagnosis present

## 2017-04-03 DIAGNOSIS — R40242 Glasgow coma scale score 9-12, unspecified time: Secondary | ICD-10-CM | POA: Diagnosis not present

## 2017-04-03 DIAGNOSIS — E785 Hyperlipidemia, unspecified: Secondary | ICD-10-CM | POA: Diagnosis present

## 2017-04-03 DIAGNOSIS — I10 Essential (primary) hypertension: Secondary | ICD-10-CM | POA: Diagnosis present

## 2017-04-03 DIAGNOSIS — R609 Edema, unspecified: Secondary | ICD-10-CM | POA: Diagnosis present

## 2017-04-03 DIAGNOSIS — Y92009 Unspecified place in unspecified non-institutional (private) residence as the place of occurrence of the external cause: Secondary | ICD-10-CM

## 2017-04-03 DIAGNOSIS — M25522 Pain in left elbow: Secondary | ICD-10-CM | POA: Diagnosis not present

## 2017-04-03 DIAGNOSIS — E871 Hypo-osmolality and hyponatremia: Secondary | ICD-10-CM | POA: Diagnosis not present

## 2017-04-03 DIAGNOSIS — D539 Nutritional anemia, unspecified: Secondary | ICD-10-CM | POA: Diagnosis present

## 2017-04-03 LAB — BASIC METABOLIC PANEL
ANION GAP: 12 (ref 5–15)
BUN: 6 mg/dL (ref 6–20)
CALCIUM: 8.3 mg/dL — AB (ref 8.9–10.3)
CO2: 25 mmol/L (ref 22–32)
CREATININE: 0.88 mg/dL (ref 0.61–1.24)
Chloride: 95 mmol/L — ABNORMAL LOW (ref 101–111)
GFR calc Af Amer: >60 mL/min (ref >60)
GLUCOSE: 219 mg/dL — AB (ref 65–99)
Potassium: 3.2 mmol/L — ABNORMAL LOW (ref 3.5–5.1)
Sodium: 132 mmol/L — ABNORMAL LOW (ref 135–145)

## 2017-04-03 LAB — URINALYSIS, ROUTINE W REFLEX MICROSCOPIC
BILIRUBIN URINE: NEGATIVE
GLUCOSE, UA: 50 mg/dL — AB
Ketones, ur: NEGATIVE mg/dL
Leukocytes, UA: NEGATIVE
NITRITE: NEGATIVE
PH: 9 — AB (ref 5.0–8.0)
Protein, ur: 100 mg/dL — AB
SPECIFIC GRAVITY, URINE: 1.008 (ref 1.005–1.030)

## 2017-04-03 LAB — CBC
HCT: 32.3 % — ABNORMAL LOW (ref 39.0–52.0)
HEMOGLOBIN: 11.4 g/dL — AB (ref 13.0–17.0)
MCH: 34.4 pg — ABNORMAL HIGH (ref 26.0–34.0)
MCHC: 35.3 g/dL (ref 30.0–36.0)
MCV: 97.6 fL (ref 78.0–100.0)
PLATELETS: 122 10*3/uL — AB (ref 150–400)
RBC: 3.31 MIL/uL — ABNORMAL LOW (ref 4.22–5.81)
RDW: 13.6 % (ref 11.5–15.5)
WBC: 6.8 10*3/uL (ref 4.0–10.5)

## 2017-04-03 LAB — RAPID URINE DRUG SCREEN, HOSP PERFORMED
AMPHETAMINES: NOT DETECTED
BARBITURATES: NOT DETECTED
BENZODIAZEPINES: NOT DETECTED
Cocaine: NOT DETECTED
Opiates: NOT DETECTED
Tetrahydrocannabinol: NOT DETECTED

## 2017-04-03 LAB — ETHANOL: Alcohol, Ethyl (B): <10 mg/dL (ref <10)

## 2017-04-03 LAB — GLUCOSE, CAPILLARY: GLUCOSE-CAPILLARY: 131 mg/dL — AB (ref 65–99)

## 2017-04-03 LAB — CBG MONITORING, ED: Glucose-Capillary: 217 mg/dL — ABNORMAL HIGH (ref 65–99)

## 2017-04-03 MED ORDER — LORAZEPAM 2 MG/ML IJ SOLN
1.0000 mg | Freq: Once | INTRAMUSCULAR | Status: AC
  Administered 2017-04-03: 1 mg via INTRAVENOUS
  Filled 2017-04-03: qty 1

## 2017-04-03 MED ORDER — ENOXAPARIN SODIUM 40 MG/0.4ML ~~LOC~~ SOLN
40.0000 mg | SUBCUTANEOUS | Status: DC
  Administered 2017-04-03 – 2017-04-11 (×8): 40 mg via SUBCUTANEOUS
  Filled 2017-04-03 (×10): qty 0.4

## 2017-04-03 MED ORDER — FLUOXETINE HCL 20 MG PO CAPS
20.0000 mg | ORAL_CAPSULE | Freq: Every day | ORAL | Status: DC
  Administered 2017-04-04 – 2017-04-12 (×8): 20 mg via ORAL
  Filled 2017-04-03 (×9): qty 1

## 2017-04-03 MED ORDER — ACETAMINOPHEN 325 MG PO TABS
650.0000 mg | ORAL_TABLET | Freq: Four times a day (QID) | ORAL | Status: DC | PRN
  Administered 2017-04-04 – 2017-04-05 (×3): 650 mg via ORAL
  Filled 2017-04-03 (×3): qty 2

## 2017-04-03 MED ORDER — HYDROCHLOROTHIAZIDE 25 MG PO TABS
25.0000 mg | ORAL_TABLET | Freq: Every day | ORAL | Status: DC
  Administered 2017-04-04 – 2017-04-12 (×8): 25 mg via ORAL
  Filled 2017-04-03 (×9): qty 1

## 2017-04-03 MED ORDER — LORAZEPAM 2 MG/ML IJ SOLN
1.0000 mg | Freq: Four times a day (QID) | INTRAMUSCULAR | Status: AC | PRN
  Administered 2017-04-04 – 2017-04-05 (×2): 1 mg via INTRAVENOUS
  Filled 2017-04-03 (×3): qty 1

## 2017-04-03 MED ORDER — THIAMINE HCL 100 MG/ML IJ SOLN
100.0000 mg | Freq: Every day | INTRAMUSCULAR | Status: DC
  Filled 2017-04-03: qty 2

## 2017-04-03 MED ORDER — LISINOPRIL-HYDROCHLOROTHIAZIDE 20-25 MG PO TABS: 1.0000 | ORAL_TABLET | Freq: Every day | ORAL | Status: DC

## 2017-04-03 MED ORDER — LORAZEPAM 1 MG PO TABS
0.0000 mg | ORAL_TABLET | Freq: Four times a day (QID) | ORAL | Status: AC
  Administered 2017-04-03 – 2017-04-04 (×3): 2 mg via ORAL
  Administered 2017-04-04: 4 mg via ORAL
  Administered 2017-04-04: 2 mg via ORAL
  Administered 2017-04-05 (×3): 4 mg via ORAL
  Filled 2017-04-03: qty 2
  Filled 2017-04-03: qty 4
  Filled 2017-04-03 (×2): qty 2
  Filled 2017-04-03: qty 4
  Filled 2017-04-03: qty 2
  Filled 2017-04-03 (×2): qty 4

## 2017-04-03 MED ORDER — LEVETIRACETAM 500 MG PO TABS
500.0000 mg | ORAL_TABLET | Freq: Two times a day (BID) | ORAL | Status: DC
  Administered 2017-04-03 – 2017-04-12 (×17): 500 mg via ORAL
  Filled 2017-04-03 (×17): qty 1

## 2017-04-03 MED ORDER — ADULT MULTIVITAMIN W/MINERALS CH
1.0000 | ORAL_TABLET | Freq: Every day | ORAL | Status: DC
  Administered 2017-04-03 – 2017-04-12 (×9): 1 via ORAL
  Filled 2017-04-03 (×9): qty 1

## 2017-04-03 MED ORDER — LISINOPRIL 10 MG PO TABS
20.0000 mg | ORAL_TABLET | Freq: Every day | ORAL | Status: DC
  Administered 2017-04-04 – 2017-04-07 (×4): 20 mg via ORAL
  Filled 2017-04-03 (×4): qty 1
  Filled 2017-04-03: qty 2

## 2017-04-03 MED ORDER — LORAZEPAM 1 MG PO TABS
0.0000 mg | ORAL_TABLET | Freq: Two times a day (BID) | ORAL | Status: AC
  Administered 2017-04-05: 2 mg via ORAL
  Administered 2017-04-06 – 2017-04-07 (×2): 4 mg via ORAL
  Filled 2017-04-03 (×2): qty 4
  Filled 2017-04-03: qty 2

## 2017-04-03 MED ORDER — POTASSIUM CHLORIDE CRYS ER 20 MEQ PO TBCR
40.0000 meq | EXTENDED_RELEASE_TABLET | Freq: Once | ORAL | Status: AC
  Administered 2017-04-03: 40 meq via ORAL
  Filled 2017-04-03: qty 2

## 2017-04-03 MED ORDER — ONDANSETRON HCL 4 MG/2ML IJ SOLN: 4.0000 mg | Freq: Four times a day (QID) | INTRAMUSCULAR | Status: DC | PRN

## 2017-04-03 MED ORDER — SODIUM CHLORIDE 0.9 % IV SOLN
INTRAVENOUS | Status: DC
  Administered 2017-04-03 (×3): via INTRAVENOUS

## 2017-04-03 MED ORDER — THIAMINE HCL 100 MG/ML IJ SOLN
Freq: Once | INTRAVENOUS | Status: AC
  Administered 2017-04-03: 20:00:00 via INTRAVENOUS
  Filled 2017-04-03: qty 1000

## 2017-04-03 MED ORDER — SODIUM CHLORIDE 0.9 % IV BOLUS (SEPSIS)
2000.0000 mL | Freq: Once | INTRAVENOUS | Status: AC
  Administered 2017-04-03: 2000 mL via INTRAVENOUS

## 2017-04-03 MED ORDER — ONDANSETRON HCL 4 MG PO TABS: 4.0000 mg | ORAL_TABLET | Freq: Four times a day (QID) | ORAL | Status: DC | PRN

## 2017-04-03 MED ORDER — ACETAMINOPHEN 650 MG RE SUPP: 650.0000 mg | Freq: Four times a day (QID) | RECTAL | Status: DC | PRN

## 2017-04-03 MED ORDER — VITAMIN B-1 100 MG PO TABS
100.0000 mg | ORAL_TABLET | Freq: Every day | ORAL | Status: DC
  Administered 2017-04-03 – 2017-04-04 (×2): 100 mg via ORAL
  Filled 2017-04-03 (×2): qty 1

## 2017-04-03 MED ORDER — GABAPENTIN 300 MG PO CAPS
300.0000 mg | ORAL_CAPSULE | Freq: Three times a day (TID) | ORAL | Status: DC
Start: 2017-04-03 — End: 2017-04-07
  Administered 2017-04-03 – 2017-04-07 (×11): 300 mg via ORAL
  Filled 2017-04-03 (×11): qty 1

## 2017-04-03 MED ORDER — FOLIC ACID 1 MG PO TABS
1.0000 mg | ORAL_TABLET | Freq: Every day | ORAL | Status: DC
  Administered 2017-04-03 – 2017-04-12 (×9): 1 mg via ORAL
  Filled 2017-04-03 (×9): qty 1

## 2017-04-03 MED ORDER — INSULIN ASPART 100 UNIT/ML ~~LOC~~ SOLN
0.0000 [IU] | Freq: Three times a day (TID) | SUBCUTANEOUS | Status: DC
Start: 2017-04-04 — End: 2017-04-12
  Administered 2017-04-04 – 2017-04-05 (×4): 1 [IU] via SUBCUTANEOUS
  Administered 2017-04-07: 2 [IU] via SUBCUTANEOUS
  Administered 2017-04-09 – 2017-04-11 (×3): 1 [IU] via SUBCUTANEOUS

## 2017-04-03 MED ORDER — LORAZEPAM 1 MG PO TABS: 1.0000 mg | ORAL_TABLET | Freq: Four times a day (QID) | ORAL | Status: AC | PRN

## 2017-04-03 NOTE — ED Notes (Signed)
ED TO INPATIENT HANDOFF REPORT  Name/Age/Gender [name] 52 y.o. male  Code Status    Code Status Orders  (From admission, onward)        Start     Ordered   04/03/17 2006  Full code  Continuous     04/03/17 2006    Code Status History    Date Active Date Inactive Code Status Order ID Comments User Context   03/23/2017 16:20 03/24/2017 15:28 Full Code [id number]  Margarita Mail, PA-C ED   01/23/2017 20:08 01/24/2017 14:32 Full Code [id number]  Greer Pickerel, MD Inpatient   07/17/2016 16:55 07/18/2016 22:52 Full Code [id number]  Bufford Lope, DO Inpatient   01/15/2015 23:27 01/16/2015 16:36 Full Code [id number]  Rise Patience, MD Inpatient      Home/SNF/Other Home  Chief Complaint baclance issues/hand tremors  Level of Care/Admitting Diagnosis ED Disposition    ED Disposition Condition [facility] Area: [facility] [[id number]]  Level of Care: Telemetry [5]  Admit to tele based on following criteria: Complex arrhythmia (Bradycardia/Tachycardia)  Diagnosis: Alcohol withdrawal ([place]) [291.81.ICD-9-CM]  Admitting Physician: Etta Quill [[id number]]  Attending Physician: Etta Quill [[id number]]  PT Class (Do Not Modify): Observation [104]  PT Acc Code (Do Not Modify): Observation [10022]       Medical History Past Medical History:  Diagnosis Date  . Abdominal aneurysm [facility])    Patient reported   . Chronic kidney disease, stage III (moderate) (HCC)    Deterding  . Hypertension   . Obesity (BMI 30-39.9)   . Other and unspecified hyperlipidemia   . Type II or unspecified type diabetes mellitus without mention of complication, not stated as uncontrolled     Allergies No Known Allergies  IV Location/Drains/Wounds Patient Lines/Drains/Airways Status   Active Line/Drains/Airways    Name:   Placement date:   Placement time:   Site:   Days:   Peripheral IV 04/03/17 Left Forearm   04/03/17    1916    Forearm   less than 1           Labs/Imaging Results for orders placed or performed during the hospital encounter of 04/03/17 (from the past 48 hour(s))  Basic metabolic panel     Status: Abnormal   Collection Time: 04/03/17  2:45 PM  Result Value Ref Range   Sodium 132 (L) 135 - 145 mmol/L   Potassium 3.2 (L) 3.5 - 5.1 mmol/L   Chloride 95 (L) 101 - 111 mmol/L   CO2 25 22 - 32 mmol/L   Glucose, Bld 219 (H) 65 - 99 mg/dL   BUN 6 6 - 20 mg/dL   Creatinine, Ser 0.88 0.61 - 1.24 mg/dL   Calcium 8.3 (L) 8.9 - 10.3 mg/dL   GFR calc non Af Amer >60 >60 mL/min   GFR calc Af Amer >60 >60 mL/min    Comment: (NOTE) The eGFR has been calculated using the CKD EPI equation. This calculation has not been validated in all clinical situations. eGFR's persistently <60 mL/min signify possible Chronic Kidney Disease.    Anion gap 12 5 - 15  CBC     Status: Abnormal   Collection Time: 04/03/17  2:45 PM  Result Value Ref Range   WBC 6.8 4.0 - 10.5 K/uL   RBC 3.31 (L) 4.22 - 5.81 MIL/uL   Hemoglobin 11.4 (L) 13.0 - 17.0 g/dL   HCT 32.3 (L) 39.0 - 52.0 %   MCV 97.6 78.0 -  100.0 fL   MCH 34.4 (H) 26.0 - 34.0 pg   MCHC 35.3 30.0 - 36.0 g/dL   RDW 13.6 11.5 - 15.5 %   Platelets 122 (L) 150 - 400 K/uL  CBG monitoring, ED     Status: Abnormal   Collection Time: 04/03/17  2:55 PM  Result Value Ref Range   Glucose-Capillary 217 (H) 65 - 99 mg/dL  Ethanol     Status: None   Collection Time: 04/03/17  3:50 PM  Result Value Ref Range   Alcohol, Ethyl (B) <10 <10 mg/dL    Comment:        LOWEST DETECTABLE LIMIT FOR SERUM ALCOHOL IS 10 mg/dL FOR MEDICAL PURPOSES ONLY   Urinalysis, Routine w reflex microscopic     Status: Abnormal   Collection Time: 04/03/17  4:30 PM  Result Value Ref Range   Color, Urine AMBER (A) YELLOW    Comment: BIOCHEMICALS MAY BE AFFECTED BY COLOR   APPearance CLEAR CLEAR   Specific Gravity, Urine 1.008 1.005 - 1.030   pH 9.0 (H) 5.0 - 8.0   Glucose, UA 50 (A) NEGATIVE mg/dL   Hgb urine dipstick  SMALL (A) NEGATIVE   Bilirubin Urine NEGATIVE NEGATIVE   Ketones, ur NEGATIVE NEGATIVE mg/dL   Protein, ur 100 (A) NEGATIVE mg/dL   Nitrite NEGATIVE NEGATIVE   Leukocytes, UA NEGATIVE NEGATIVE   RBC / HPF 0-5 0 - 5 RBC/hpf   WBC, UA 0-5 0 - 5 WBC/hpf   Bacteria, UA RARE (A) NONE SEEN   Squamous Epithelial / LPF 0-5 (A) NONE SEEN  Rapid urine drug screen (hospital performed)     Status: None   Collection Time: 04/03/17  4:30 PM  Result Value Ref Range   Opiates NONE DETECTED NONE DETECTED   Cocaine NONE DETECTED NONE DETECTED   Benzodiazepines NONE DETECTED NONE DETECTED   Amphetamines NONE DETECTED NONE DETECTED   Tetrahydrocannabinol NONE DETECTED NONE DETECTED   Barbiturates NONE DETECTED NONE DETECTED    Comment: (NOTE) DRUG SCREEN FOR MEDICAL PURPOSES ONLY.  IF CONFIRMATION IS NEEDED FOR ANY PURPOSE, NOTIFY LAB WITHIN 5 DAYS. LOWEST DETECTABLE LIMITS FOR URINE DRUG SCREEN Drug Class                     Cutoff (ng/mL) Amphetamine and metabolites    1000 Barbiturate and metabolites    200 Benzodiazepine                 263 Tricyclics and metabolites     300 Opiates and metabolites        300 Cocaine and metabolites        300 THC                            50    Dg Chest 2 View  Result Date: 04/03/2017 CLINICAL DATA:  Shortness of breath today EXAM: CHEST  2 VIEW COMPARISON:  January 16, 2015 FINDINGS: The heart size and mediastinal contours are within normal limits. There is no focal infiltrate, pulmonary edema, or pleural effusion. The visualized skeletal structures are stable. IMPRESSION: No active cardiopulmonary disease. Electronically Signed   By: Abelardo Diesel M.D.   On: 04/03/2017 15:55   Ct Head Wo Contrast  Result Date: 04/03/2017 CLINICAL DATA:  Altered mental status.  Imbalance. EXAM: CT HEAD WITHOUT CONTRAST TECHNIQUE: Contiguous axial images were obtained from the base of the skull through the vertex without intravenous  contrast. COMPARISON:  02/13/2017.  FINDINGS: Brain: Diffusely enlarged ventricles and subarachnoid spaces. No intracranial hemorrhage, mass lesion or CT evidence of acute infarction. Vascular: No hyperdense vessel or unexpected calcification. Skull: Stable right zygomatic fixation hardware. No acute skull fracture seen. 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 Other: Small left posterior scalp hematoma. IMPRESSION: 1. Small left posterior scalp hematoma without skull fracture or intracranial hemorrhage. 2. Mild-to-moderate diffuse cerebral and cerebellar atrophy. 3. 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 Electronically Signed   By: Claudie Revering M.D.   On: 04/03/2017 16:08    Pending Labs Unresulted Labs (From admission, onward)   Start     Ordered   04/04/17 0500  CBC  Tomorrow morning,   R     04/03/17 2006   04/04/17 5361  Basic metabolic panel  Tomorrow morning,   R     04/03/17 2006      Vitals/Pain Today's Vitals   04/03/17 1700 04/03/17 1932 04/03/17 2000 04/03/17 2017  BP: (!) 164/87 (!) 172/93 (!) 142/68 (!) 142/68  Pulse:  (!) 129 (!) 123 (!) 111  Resp:  20    Temp:      TempSrc:      SpO2:  99% 99%   Weight:      Height:      PainSc:        Isolation Precautions No active isolations  Medications Medications  0.9 %  sodium chloride infusion ( Intravenous New Bag/Given 04/03/17 2016)  levETIRAcetam (KEPPRA) tablet 500 mg (not administered)  gabapentin (NEURONTIN) capsule 300 mg (not administered)  FLUoxetine (PROZAC) tablet 20 mg (not administered)  insulin aspart (novoLOG) injection 0-9 Units (not administered)  LORazepam (ATIVAN) tablet 1 mg (not administered)    Or  LORazepam (ATIVAN) injection 1 mg (not administered)  thiamine (VITAMIN B-1) tablet 100 mg (not administered)    Or  thiamine (B-1) injection 100 mg (not administered)  folic  acid (FOLVITE) tablet 1 mg (not administered)  multivitamin with minerals tablet 1 tablet (not administered)  LORazepam (ATIVAN) tablet 0-4 mg (not administered)    Followed by  LORazepam (ATIVAN) tablet 0-4 mg (not administered)  lisinopril (PRINIVIL,ZESTRIL) tablet 20 mg (not administered)  hydrochlorothiazide (HYDRODIURIL) tablet 25 mg (not administered)  acetaminophen (TYLENOL) tablet 650 mg (not administered)    Or  acetaminophen (TYLENOL) suppository 650 mg (not administered)  ondansetron (ZOFRAN) tablet 4 mg (not administered)    Or  ondansetron (ZOFRAN) injection 4 mg (not administered)  enoxaparin (LOVENOX) injection 40 mg (not administered)  potassium chloride SA (K-DUR,KLOR-CON) CR tablet 40 mEq (not administered)  sodium chloride 0.9 % bolus 2,000 mL (2,000 mLs Intravenous New Bag/Given 04/03/17 1558)  LORazepam (ATIVAN) injection 1 mg (1 mg Intravenous Given 04/03/17 1728)  sodium chloride 0.9 % 1,000 mL with thiamine 443 mg, folic acid 1 mg, multivitamins adult 10 mL infusion ( Intravenous New Bag/Given 04/03/17 2007)    Mobility walks
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
## 2017-04-03 NOTE — ED Notes (Signed)
ED Provider at bedside. 

## 2017-04-03 NOTE — H&P (Signed)
History and Physical    Christopher ReichertManuel Tuckett WUJ:[id number] DOB: November 09, 1965 DOA: 04/03/2017  PCP: Beryle Latheeterding, James, MD  Patient coming from: Home  I have personally briefly reviewed patient's [facility]  Chief Complaint: Balance issues  HPI: [name] is a 52 y.o. male with medical history significant of EtOH abuse, ongoing.  Last drink he states was 4-5 days ago.  Now in ED, tremor and balance issues with standing.  No hearing loss.   ED Course: Tachy to 130.  Tremor.  CT head neg for acute issues.   Review of Systems: As per HPI otherwise 10 point review of systems negative.   Past Medical History:  Diagnosis Date  . Abdominal aneurysm [facility](HCC)    Patient reported   . Chronic kidney disease, stage III (moderate) (HCC)    Deterding  . Hypertension   . Obesity (BMI 30-39.9)   . Other and unspecified hyperlipidemia   . Type II or unspecified type diabetes mellitus without mention of complication, not stated as uncontrolled     Past Surgical History:  Procedure Laterality Date  . stomach vessel aneurysm     Patient reported      reports that he is a non-smoker but has been exposed to tobacco smoke. he has never used smokeless tobacco. He reports that he drinks about 7.2 oz of alcohol per week. He reports that he does not use drugs.  No Known Allergies  Family History  Problem Relation Age of Onset  . Diabetes Mellitus II Mother      Prior to Admission medications   Medication Sig Start Date End Date Taking? Authorizing Provider  Canagliflozin (INVOKANA PO) Take 300 mg by mouth daily.    Yes [provider]  colesevelam (WELCHOL) 625 MG tablet Take 625 mg by mouth 3 (three) times daily.   Yes [provider]  FLUoxetine (PROZAC) 20 MG tablet Take 20 mg by mouth daily.   Yes [provider]  folic acid (FOLVITE) 1 MG tablet Take 1 tablet (1 mg total) by mouth daily. 01/16/15  Yes Alison Murrayevine, Alma M, MD    gabapentin (NEURONTIN) 300 MG capsule Take 1 capsule (300 mg total) by mouth 3 (three) times daily. 03/24/17  Yes Charm RingsLord, Jamison Y, NP  levETIRAcetam (KEPPRA) 500 MG tablet Take 1 tablet (500 mg total) by mouth 2 (two) times daily. 07/18/16  Yes Wendee BeaversMcMullen, David J, DO  lisinopril-hydrochlorothiazide (PRINZIDE,ZESTORETIC) 20-25 MG tablet Take 1 tablet by mouth daily.   Yes [provider]  metFORMIN (GLUCOPHAGE) 500 MG tablet Take 1 tablet (500 mg total) by mouth 2 (two) times daily with a meal. 01/16/15  Yes Alison Murrayevine, Alma M, MD    Physical Exam: Vitals:   04/03/17 1555 04/03/17 1600 04/03/17 1700 04/03/17 1932  BP: (!) 144/90 (!) 140/95 (!) 164/87 (!) 172/93  Pulse: (!) 113 (!) 110  (!) 129  Resp: 20 (!) 27  20  Temp:      TempSrc:      SpO2: 98% 96%  99%  Weight:      Height:        Constitutional: NAD, calm, comfortable Eyes: PERRL, lids and conjunctivae normal ENMT: Mucous membranes are moist. Posterior pharynx clear of any exudate or lesions.Normal dentition.  Neck: normal, supple, no masses, no thyromegaly Respiratory: clear to auscultation bilaterally, no wheezing, no crackles. Normal respiratory effort. No accessory muscle use.  Cardiovascular: Tachycardic Abdomen: no tenderness, no masses palpated. No hepatosplenomegaly. Bowel sounds positive.  Musculoskeletal:  no clubbing / cyanosis. No joint deformity upper and lower extremities. Good ROM, no contractures. Normal muscle tone.  Skin: no rashes, lesions, ulcers. No induration Neurologic: CN 2-12 grossly intact. Sensation intact, DTR normal. Strength 5/5 in all 4. Tremor noted. Psychiatric: Normal judgment and insight. Alert and oriented x 3. Normal mood.    Labs on Admission: I have personally reviewed following labs and imaging studies  CBC: Recent Labs  Lab 04/03/17 1445  WBC 6.8  HGB 11.4*  HCT 32.3*  MCV 97.6  PLT 122*   Basic Metabolic Panel: Recent Labs  Lab 04/03/17 1445  NA 132*  K 3.2*  CL 95*   CO2 25  GLUCOSE 219*  BUN 6  CREATININE 0.88  CALCIUM 8.3*   GFR: Estimated Creatinine Clearance: 122.5 mL/min (by C-G formula based on SCr of 0.88 mg/dL). Liver Function Tests: No results for input(s): AST, ALT, ALKPHOS, BILITOT, PROT, ALBUMIN in the last 168 hours. No results for input(s): LIPASE, AMYLASE in the last 168 hours. No results for input(s): AMMONIA in the last 168 hours. Coagulation Profile: No results for input(s): INR, PROTIME in the last 168 hours. Cardiac Enzymes: No results for input(s): CKTOTAL, CKMB, CKMBINDEX, TROPONINI in the last 168 hours. BNP (last 3 results) No results for input(s): PROBNP in the last 8760 hours. HbA1C: No results for input(s): HGBA1C in the last 72 hours. CBG: Recent Labs  Lab 04/03/17 1455  GLUCAP 217*   Lipid Profile: No results for input(s): CHOL, HDL, LDLCALC, TRIG, CHOLHDL, LDLDIRECT in the last 72 hours. Thyroid Function Tests: No results for input(s): TSH, T4TOTAL, FREET4, T3FREE, THYROIDAB in the last 72 hours. Anemia Panel: No results for input(s): VITAMINB12, FOLATE, FERRITIN, TIBC, IRON, RETICCTPCT in the last 72 hours. Urine analysis:    Component Value Date/Time   COLORURINE AMBER (A) 04/03/2017 1630   APPEARANCEUR CLEAR 04/03/2017 1630   LABSPEC 1.008 04/03/2017 1630   PHURINE 9.0 (H) 04/03/2017 1630   GLUCOSEU 50 (A) 04/03/2017 1630   HGBUR SMALL (A) 04/03/2017 1630   BILIRUBINUR NEGATIVE 04/03/2017 1630   KETONESUR NEGATIVE 04/03/2017 1630   PROTEINUR 100 (A) 04/03/2017 1630   UROBILINOGEN 0.2 01/15/2015 2117   NITRITE NEGATIVE 04/03/2017 1630   LEUKOCYTESUR NEGATIVE 04/03/2017 1630    Radiological Exams on Admission: Dg Chest 2 View  Result Date: 04/03/2017 CLINICAL DATA:  Shortness of breath today EXAM: CHEST  2 VIEW COMPARISON:  January 16, 2015 FINDINGS: The heart size and mediastinal contours are within normal limits. There is no focal infiltrate, pulmonary edema, or pleural effusion. The  visualized skeletal structures are stable. IMPRESSION: No active cardiopulmonary disease. Electronically Signed   By: Sherian Rein M.D.   On: 04/03/2017 15:55   Ct Head Wo Contrast  Result Date: 04/03/2017 CLINICAL DATA:  Altered mental status.  Imbalance. EXAM: CT HEAD WITHOUT CONTRAST TECHNIQUE: Contiguous axial images were obtained from the base of the skull through the vertex without intravenous contrast. COMPARISON:  02/13/2017. FINDINGS: Brain: Diffusely enlarged ventricles and subarachnoid spaces. No intracranial hemorrhage, mass lesion or CT evidence of acute infarction. Vascular: No hyperdense vessel or unexpected calcification. Skull: Stable right zygomatic fixation hardware. No acute skull fracture seen. 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 Other: Small left posterior scalp hematoma. IMPRESSION: 1. Small left posterior scalp hematoma without skull fracture or intracranial hemorrhage. 2. Mild-to-moderate diffuse cerebral and cerebellar atrophy. 3. Prosthetic right globe with  adjacent postsurgical and posttraumatic changes. Electronically Signed   By: Beckie Salts M.D.   On: 04/03/2017 16:08    EKG: Independently reviewed.  Assessment/Plan Principal Problem:   Alcohol withdrawal (HCC) Active Problems:   DM2 (diabetes mellitus, type 2) (HCC)    1. EtOH withdrawal - 1. CIWA 2. Banana bag 2. DM2 - 1. SSI AC sensitive scale  DVT prophylaxis: Lovenox Code Status: Full Family Communication: No family in room Disposition Plan: TBD Consults called: None Admission status: Place in obs   [place] Hair, Heywood Iles. DO [facility] Pager [phone]  If 7AM-7PM, please contact day team taking care of patient [email or web address] Password [facility]  04/03/2017, 7:57 PM

## 2017-04-03 NOTE — ED Notes (Signed)
Patient transported to CT 

## 2017-04-03 NOTE — ED Notes (Signed)
Pt ambulated in the hallway without assistance, and except for some shaking/tremors he had steady gait.

## 2017-04-03 NOTE — ED Triage Notes (Signed)
Pt states for the last week he has issues with balance, states he has not drank ETOH in last 4 days, reports depression and difficult to get out of bed due to it.

## 2017-04-04 DIAGNOSIS — N183 Chronic kidney disease, stage 3 (moderate): Secondary | ICD-10-CM | POA: Diagnosis present

## 2017-04-04 DIAGNOSIS — S0003XA Contusion of scalp, initial encounter: Secondary | ICD-10-CM | POA: Diagnosis present

## 2017-04-04 DIAGNOSIS — Y92009 Unspecified place in unspecified non-institutional (private) residence as the place of occurrence of the external cause: Secondary | ICD-10-CM | POA: Diagnosis not present

## 2017-04-04 DIAGNOSIS — R40241 Glasgow coma scale score 13-15, unspecified time: Secondary | ICD-10-CM | POA: Diagnosis present

## 2017-04-04 DIAGNOSIS — E669 Obesity, unspecified: Secondary | ICD-10-CM | POA: Diagnosis present

## 2017-04-04 DIAGNOSIS — E871 Hypo-osmolality and hyponatremia: Secondary | ICD-10-CM | POA: Diagnosis not present

## 2017-04-04 DIAGNOSIS — F10239 Alcohol dependence with withdrawal, unspecified: Secondary | ICD-10-CM | POA: Diagnosis present

## 2017-04-04 DIAGNOSIS — R40242 Glasgow coma scale score 9-12, unspecified time: Secondary | ICD-10-CM | POA: Diagnosis not present

## 2017-04-04 DIAGNOSIS — G9341 Metabolic encephalopathy: Secondary | ICD-10-CM | POA: Diagnosis present

## 2017-04-04 DIAGNOSIS — E1122 Type 2 diabetes mellitus with diabetic chronic kidney disease: Secondary | ICD-10-CM | POA: Diagnosis present

## 2017-04-04 DIAGNOSIS — E860 Dehydration: Secondary | ICD-10-CM | POA: Diagnosis present

## 2017-04-04 DIAGNOSIS — R17 Unspecified jaundice: Secondary | ICD-10-CM | POA: Diagnosis present

## 2017-04-04 DIAGNOSIS — G9340 Encephalopathy, unspecified: Secondary | ICD-10-CM | POA: Diagnosis not present

## 2017-04-04 DIAGNOSIS — S81811A Laceration without foreign body, right lower leg, initial encounter: Secondary | ICD-10-CM | POA: Diagnosis present

## 2017-04-04 DIAGNOSIS — R270 Ataxia, unspecified: Secondary | ICD-10-CM

## 2017-04-04 DIAGNOSIS — I129 Hypertensive chronic kidney disease with stage 1 through stage 4 chronic kidney disease, or unspecified chronic kidney disease: Secondary | ICD-10-CM | POA: Diagnosis present

## 2017-04-04 DIAGNOSIS — Z6831 Body mass index (BMI) 31.0-31.9, adult: Secondary | ICD-10-CM | POA: Diagnosis not present

## 2017-04-04 DIAGNOSIS — F329 Major depressive disorder, single episode, unspecified: Secondary | ICD-10-CM | POA: Diagnosis present

## 2017-04-04 DIAGNOSIS — D696 Thrombocytopenia, unspecified: Secondary | ICD-10-CM | POA: Diagnosis present

## 2017-04-04 DIAGNOSIS — E785 Hyperlipidemia, unspecified: Secondary | ICD-10-CM | POA: Diagnosis present

## 2017-04-04 DIAGNOSIS — W1830XA Fall on same level, unspecified, initial encounter: Secondary | ICD-10-CM | POA: Diagnosis present

## 2017-04-04 DIAGNOSIS — I10 Essential (primary) hypertension: Secondary | ICD-10-CM | POA: Diagnosis present

## 2017-04-04 DIAGNOSIS — R569 Unspecified convulsions: Secondary | ICD-10-CM | POA: Diagnosis present

## 2017-04-04 DIAGNOSIS — N179 Acute kidney failure, unspecified: Secondary | ICD-10-CM | POA: Diagnosis not present

## 2017-04-04 DIAGNOSIS — R000 Tachycardia, unspecified: Secondary | ICD-10-CM | POA: Diagnosis present

## 2017-04-04 DIAGNOSIS — F10232 Alcohol dependence with withdrawal with perceptual disturbance: Secondary | ICD-10-CM | POA: Diagnosis not present

## 2017-04-04 DIAGNOSIS — E876 Hypokalemia: Secondary | ICD-10-CM | POA: Diagnosis present

## 2017-04-04 DIAGNOSIS — D539 Nutritional anemia, unspecified: Secondary | ICD-10-CM | POA: Diagnosis present

## 2017-04-04 DIAGNOSIS — R81 Glycosuria: Secondary | ICD-10-CM | POA: Diagnosis present

## 2017-04-04 LAB — GLUCOSE, CAPILLARY
GLUCOSE-CAPILLARY: 133 mg/dL — AB (ref 65–99)
GLUCOSE-CAPILLARY: 158 mg/dL — AB (ref 65–99)
Glucose-Capillary: 108 mg/dL — ABNORMAL HIGH (ref 65–99)
Glucose-Capillary: 132 mg/dL — ABNORMAL HIGH (ref 65–99)

## 2017-04-04 LAB — BASIC METABOLIC PANEL
ANION GAP: 9 (ref 5–15)
BUN: 6 mg/dL (ref 6–20)
CO2: 25 mmol/L (ref 22–32)
Calcium: 8.1 mg/dL — ABNORMAL LOW (ref 8.9–10.3)
Chloride: 102 mmol/L (ref 101–111)
Creatinine, Ser: 0.7 mg/dL (ref 0.61–1.24)
GFR calc Af Amer: >60 mL/min (ref >60)
GFR calc non Af Amer: >60 mL/min (ref >60)
GLUCOSE: 111 mg/dL — AB (ref 65–99)
POTASSIUM: 3.6 mmol/L (ref 3.5–5.1)
Sodium: 136 mmol/L (ref 135–145)

## 2017-04-04 LAB — CBC
HEMATOCRIT: 32.5 % — AB (ref 39.0–52.0)
Hemoglobin: 11 g/dL — ABNORMAL LOW (ref 13.0–17.0)
MCH: 33.7 pg (ref 26.0–34.0)
MCHC: 33.8 g/dL (ref 30.0–36.0)
MCV: 99.7 fL (ref 78.0–100.0)
Platelets: 104 10*3/uL — ABNORMAL LOW (ref 150–400)
RBC: 3.26 MIL/uL — AB (ref 4.22–5.81)
RDW: 14 % (ref 11.5–15.5)
WBC: 5.1 10*3/uL (ref 4.0–10.5)

## 2017-04-04 NOTE — Consult Note (Signed)
WOC Nurse wound consult note Reason for Consult:Fall with multiple abrasions, bruises and edema noted to right foot and left elbow.   Wound type:Trauma Pressure Injury POA: NA Measurement:Right anterior shin:  2 cm x 3.4 cm scabbed lesion Wound bed: scabbed Drainage (amount, consistency, odor) none noted Periwound:edema to right foot  Dressing procedure/placement/frequency: Cleanse wound to right shin with soap and water daily.  Apply nonadherent dressing (silicone border foam).  Change every three days and PRN soilage.  Will not follow at this time.  Please re-consult if needed.  Maple HudsonKaren Kelcey Korus RN BSN CWON Pager [phone]

## 2017-04-04 NOTE — Evaluation (Signed)
Physical Therapy Evaluation Patient Details Name: [name] MRN: [id number] DOB: 04/28/1965 Today's Date: 04/04/2017   History of Present Illness  52 yo male admitted with ETOH W/D. Hx of ETOH abuse, obesity, CKD, falls, SAH, DM, AAA.   Clinical Impression  On eval, pt required Min assist for mobility. He walked ~500 feet with a RW. He is unsteady and at risk for falls. LOB x2  during ambulation. He tolerated distance well. He is eager to mobilize more. Recommend frequent walks with nursing assistance on unit. Recommend RW use for safe ambulation. Discussed d/c plan-pt plans to return home alone. He declined SNF placement during evaluation. Do not feel he is safe to d/c home alone at this time. Will follow and progress activity as tolerated.     Follow Up Recommendations Home health PT;Supervision/Assistance - 24 hour (pt declines placement)    Equipment Recommendations  Rolling walker with 5" wheels    Recommendations for Other Services       Precautions / Restrictions Precautions Precautions: Fall Restrictions Weight Bearing Restrictions: No      Mobility  Bed Mobility Overal bed mobility: Needs Assistance Bed Mobility: Supine to Sit     Supine to sit: Modified independent (Device/Increase time)        Transfers Overall transfer level: Needs assistance Equipment used: None Transfers: Sit to/from Stand Sit to Stand: Min assist         General transfer comment: Assist to steady. Poor safety awareness.   Ambulation/Gait Ambulation/Gait assistance: Min assist Ambulation Distance (Feet): 500 Feet Assistive device: Rolling walker (2 wheeled) Gait Pattern/deviations: Step-through pattern;Wide base of support     General Gait Details: Unsteady. Pt required assist to stabilize throughout ambulation distance. LOB x2. Poor safety awareness. Pt tolerated distance well. He is eager to walk more.   Stairs            Wheelchair Mobility    Modified  Rankin (Stroke Patients Only)       Balance Overall balance assessment: Needs assistance           Standing balance-Leahy Scale: Fair                               Pertinent Vitals/Pain Pain Assessment: No/denies pain    Home Living Family/patient expects to be discharged to:: Private residence Living Arrangements: Alone   Type of Home: House Home Access: Stairs to enter     Home Layout: One level Home Equipment: None      Prior Function Level of Independence: Independent               Hand Dominance        Extremity/Trunk Assessment   Upper Extremity Assessment Upper Extremity Assessment: Overall WFL for tasks assessed    Lower Extremity Assessment Lower Extremity Assessment: Generalized weakness    Cervical / Trunk Assessment Cervical / Trunk Assessment: Normal  Communication   Communication: No difficulties  Cognition Arousal/Alertness: Awake/alert Behavior During Therapy: WFL for tasks assessed/performed Overall Cognitive Status: Impaired/Different from baseline Area of Impairment: Safety/judgement                         Safety/Judgement: Decreased awareness of safety;Decreased awareness of deficits            General Comments      Exercises     Assessment/Plan    PT Assessment Patient needs continued PT services  PT Problem List Decreased balance;Decreased mobility;Decreased safety awareness       PT Treatment Interventions Functional mobility training;Balance training;Patient/family education;Therapeutic activities;Gait training;DME instruction    PT Goals (Current goals can be found in the Care Plan section)  Acute Rehab PT Goals Patient Stated Goal: home PT Goal Formulation: With patient Time For Goal Achievement: 04/18/17 Potential to Achieve Goals: Good    Frequency Min 3X/week   Barriers to discharge        Co-evaluation               AM-PAC PT "6 Clicks" Daily Activity   Outcome Measure Difficulty turning over in bed (including adjusting bedclothes, sheets and blankets)?: Unable Difficulty moving from lying on back to sitting on the side of the bed? : Unable Difficulty sitting down on and standing up from a chair with arms (e.g., wheelchair, bedside commode, etc,.)?: A Little Help needed moving to and from a bed to chair (including a wheelchair)?: A Little Help needed walking in hospital room?: A Little Help needed climbing 3-5 steps with a railing? : A Lot 6 Click Score: 13    End of Session Equipment Utilized During Treatment: Gait belt Activity Tolerance: Patient tolerated treatment well Patient left: in chair;with call bell/phone within reach;with chair alarm set   PT Visit Diagnosis: Difficulty in walking, not elsewhere classified (R26.2)    Time: [id number] PT Time Calculation (min) (ACUTE ONLY): 17 min   Charges:   PT Evaluation $PT Eval Moderate Complexity: 1 Mod     PT G Codes:          Rebeca AlertJannie Clarke Peretz, MPT Pager: [phone]

## 2017-04-04 NOTE — Progress Notes (Signed)
PROGRESS NOTE    Christopher ReichertManuel Shaffer  UJW:[id number] DOB: [birth year] DOA: 04/03/2017 PCP: Beryle Latheeterding, James, MD    Brief Narrative:  52 year old male who presents with ambulatory dysfunction. Patient is known to have significant past medical history alcohol abuse. Last drink about 5 days prior to hospitalization, patient had developed tremors and difficulty ambulating. Apparently a neighbor called the police due to patient's poor condition. On the initial physical examination blood pressure 144/90, heart rate 113 229, respiratory 27, saturation 99%. Moist mucous membranes, lungs clear to auscultation bilaterally, heart S1-S2 present tachycardic, abdomen soft and nontender, no lower extremity edema. Sodium 132, potassium 3.2, chloride, bicarbonate 25, glucose 219, BUN 6, creatinine 0.8, white count 6.8, hemoglobin 11.4, hematocrit 32.3, platelets 122. EKG sinus rhythm, 62 as per minute, normal intervals and normal axis. Chest x-ray with left rotation, no infiltrates. Urine analysis negative for infection, urine drug screen negative, alcohol level less than 10. Head CT with small left posterior scalp hematoma without skull fracture or intracranial hemorrhage. Mild to moderate diffuse cerebral and cerebellar atrophy. 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   Patient was admitted to the hospital working diagnosis of alcohol withdrawal syndrome.  Assessment & Plan:   Principal Problem:   Alcohol withdrawal (HCC) Active Problems:   DM2 (diabetes mellitus, type 2) (HCC)   1. Alcohol withdrawal syndrome. Patient continue to be somnolent, positive tremors, will request physical therapy evaluation, no nausea or vomiting. Patient with no family support. Will continue neuro checks per unit protocol. Patient had total of 6 mg lorazepam po since admission. Continue keppra per home regimen.   2. T2DM. Will continue glucose cover and monitoring with insulin sliding scale,  capillary glucose 131, 108, 133. Will monitor patient's po intake.   3. Ambulatory dysfunction. Will continue physical therapy evaluation, to assess patient safety.    DVT prophylaxis: enoxapatin  Code Status:  full Family Communication: no family at the bedside Disposition Plan: home   Consultants:     Procedures:     Antimicrobials:       Subjective: Patient feeling better, decrease anxiety and tremors, improved balance, but positive somnolence, no nausea or vomiting. No family support.   Objective: Vitals:   04/03/17 2130 04/03/17 2227 04/04/17 0021 04/04/17 0417  BP: (!) 151/96 (!) 142/74 137/74 (!) 152/84  Pulse: (!) 103 90 (!) 120 (!) 101  Resp: 20 20 20 18   Temp: 99.2 F (37.3 C)  99.3 F (37.4 C)   TempSrc: Oral  Oral Oral  SpO2: 96% 96% 98% 98%  Weight: 103.8 kg (228 lb 13.4 oz)     Height: 6' (1.829 m)       Intake/Output Summary (Last 24 hours) at 04/04/2017 1140 Last data filed at 04/04/2017 0710 Gross per 24 hour  Intake 5527.5 ml  Output 353 ml  Net 5174.5 ml   Filed Weights   04/03/17 1414 04/03/17 2130  Weight: 106.6 kg (235 lb) 103.8 kg (228 lb 13.4 oz)    Examination:   General: deconditioned Neurology: Awake and alert, non focal  E ENT: mild pallor, no icterus, oral mucosa moist. Positive periorbital ecchymosis on the right.  Cardiovascular: No JVD. S1-S2 present, rhythmic, no gallops, rubs, or murmurs. No lower extremity edema. Pulmonary: vesicular breath sounds bilaterally, adequate air movement, no wheezing, rhonchi or rales. Gastrointestinal. Abdomen flat, no organomegaly, non tender, no rebound or guarding Skin. No rashes Musculoskeletal: no joint deformities     Data Reviewed: I have personally reviewed following labs and  imaging studies  CBC: Recent Labs  Lab 04/03/17 1445 04/04/17 0600  WBC 6.8 5.1  HGB 11.4* 11.0*  HCT 32.3* 32.5*  MCV 97.6 99.7  PLT 122* 104*   Basic Metabolic Panel: Recent Labs  Lab  04/03/17 1445 04/04/17 0600  NA 132* 136  K 3.2* 3.6  CL 95* 102  CO2 25 25  GLUCOSE 219* 111*  BUN 6 6  CREATININE 0.88 0.70  CALCIUM 8.3* 8.1*   GFR: Estimated Creatinine Clearance: 133.1 mL/min (by C-G formula based on SCr of 0.7 mg/dL). Liver Function Tests: No results for input(s): AST, ALT, ALKPHOS, BILITOT, PROT, ALBUMIN in the last 168 hours. No results for input(s): LIPASE, AMYLASE in the last 168 hours. No results for input(s): AMMONIA in the last 168 hours. Coagulation Profile: No results for input(s): INR, PROTIME in the last 168 hours. Cardiac Enzymes: No results for input(s): CKTOTAL, CKMB, CKMBINDEX, TROPONINI in the last 168 hours. BNP (last 3 results) No results for input(s): PROBNP in the last 8760 hours. HbA1C: No results for input(s): HGBA1C in the last 72 hours. CBG: Recent Labs  Lab 04/03/17 1455 04/03/17 2201 04/04/17 0746  GLUCAP 217* 131* 108*   Lipid Profile: No results for input(s): CHOL, HDL, LDLCALC, TRIG, CHOLHDL, LDLDIRECT in the last 72 hours. Thyroid Function Tests: No results for input(s): TSH, T4TOTAL, FREET4, T3FREE, THYROIDAB in the last 72 hours. Anemia Panel: No results for input(s): VITAMINB12, FOLATE, FERRITIN, TIBC, IRON, RETICCTPCT in the last 72 hours.    Radiology Studies: I have reviewed all of the imaging during this hospital visit personally     Scheduled Meds: . enoxaparin (LOVENOX) injection  40 mg Subcutaneous Q24H  . FLUoxetine  20 mg Oral Daily  . folic acid  1 mg Oral Daily  . gabapentin  300 mg Oral TID  . hydrochlorothiazide  25 mg Oral Daily  . insulin aspart  0-9 Units Subcutaneous TID WC  . levETIRAcetam  500 mg Oral BID  . lisinopril  20 mg Oral Daily  . LORazepam  0-4 mg Oral Q6H   Followed by  . [START ON 04/05/2017] LORazepam  0-4 mg Oral Q12H  . multivitamin with minerals  1 tablet Oral Daily   Continuous Infusions:   LOS: 0 days        Mauricio Annett Gula, MD [facility] Pager [phone]

## 2017-04-04 NOTE — Care Management Note (Signed)
Case Management Note  Patient Details  Name: Carlyn ReichertManuel Birman MRN: [id number] Date of Birth: [birth year]  Subjective/Objective:                  52 y.o. male with medical history significant of EtOH abuse, ongoing.  Last drink he states was 4-5 days ago.  Now in ED, tremor and balance issues with standing.  No hearing loss.     Action/Plan: Date:  April 04, 2017 Chart reviewed for concurrent status and case management needs.  Will continue to follow patient progress.  Discharge Planning: following for needs.  None present at this time of review. Expected discharge date: January 172019 Marcelle SmilingRhonda Brenly Trawick, BSN, [place], [place]   [phone]   Expected Discharge Date:                  Expected Discharge Plan:  Home/Self Care  In-House Referral:     Discharge planning Services  CM Consult  Post Acute Care Choice:    Choice offered to:     DME Arranged:    DME Agency:     HH Arranged:    HH Agency:     Status of Service:  In process, will continue to follow  If discussed at Long Length of Stay Meetings, dates discussed:    Additional Comments:  Golda AcreDavis, Khoury Siemon Lynn, RN 04/04/2017, 10:30 AM

## 2017-04-05 DIAGNOSIS — F10232 Alcohol dependence with withdrawal with perceptual disturbance: Secondary | ICD-10-CM

## 2017-04-05 DIAGNOSIS — R000 Tachycardia, unspecified: Secondary | ICD-10-CM

## 2017-04-05 DIAGNOSIS — E876 Hypokalemia: Secondary | ICD-10-CM

## 2017-04-05 DIAGNOSIS — E119 Type 2 diabetes mellitus without complications: Secondary | ICD-10-CM

## 2017-04-05 DIAGNOSIS — G9341 Metabolic encephalopathy: Secondary | ICD-10-CM

## 2017-04-05 LAB — GLUCOSE, CAPILLARY
GLUCOSE-CAPILLARY: 134 mg/dL — AB (ref 65–99)
GLUCOSE-CAPILLARY: 129 mg/dL — AB (ref 65–99)
Glucose-Capillary: 126 mg/dL — ABNORMAL HIGH (ref 65–99)
Glucose-Capillary: 95 mg/dL (ref 65–99)

## 2017-04-05 MED ORDER — LORAZEPAM 2 MG/ML IJ SOLN
2.0000 mg | Freq: Once | INTRAMUSCULAR | Status: AC
  Administered 2017-04-05: 2 mg via INTRAVENOUS

## 2017-04-05 MED ORDER — HALOPERIDOL LACTATE 5 MG/ML IJ SOLN
2.0000 mg | INTRAMUSCULAR | Status: DC | PRN
  Administered 2017-04-05 – 2017-04-09 (×7): 2 mg via INTRAMUSCULAR
  Filled 2017-04-05 (×8): qty 1

## 2017-04-05 MED ORDER — LORAZEPAM 2 MG/ML IJ SOLN
2.0000 mg | Freq: Once | INTRAMUSCULAR | Status: AC
  Administered 2017-04-05: 2 mg via INTRAVENOUS
  Filled 2017-04-05: qty 1

## 2017-04-05 MED ORDER — SODIUM CHLORIDE 0.9 % IV SOLN
0.2000 ug/kg/h | INTRAVENOUS | Status: DC
  Filled 2017-04-05: qty 2

## 2017-04-05 MED ORDER — HALOPERIDOL LACTATE 5 MG/ML IJ SOLN
1.0000 mg | INTRAMUSCULAR | Status: DC | PRN
  Administered 2017-04-05: 1 mg via INTRAMUSCULAR
  Filled 2017-04-05: qty 1

## 2017-04-05 MED ORDER — LORAZEPAM 2 MG/ML IJ SOLN
2.0000 mg | INTRAMUSCULAR | Status: DC
  Administered 2017-04-05 – 2017-04-07 (×11): 2 mg via INTRAVENOUS
  Filled 2017-04-05 (×11): qty 1

## 2017-04-05 MED ORDER — HALOPERIDOL 1 MG PO TABS
1.0000 mg | ORAL_TABLET | ORAL | Status: DC | PRN
  Filled 2017-04-05: qty 1

## 2017-04-05 MED ORDER — LORAZEPAM 2 MG/ML IJ SOLN
4.0000 mg | Freq: Once | INTRAMUSCULAR | Status: AC
  Administered 2017-04-05: 4 mg via INTRAVENOUS
  Filled 2017-04-05: qty 2

## 2017-04-05 MED ORDER — HALOPERIDOL 2 MG PO TABS
2.0000 mg | ORAL_TABLET | ORAL | Status: DC | PRN
  Administered 2017-04-08 – 2017-04-09 (×4): 2 mg via ORAL
  Filled 2017-04-05 (×5): qty 2
  Filled 2017-04-05: qty 1

## 2017-04-05 MED ORDER — VITAMIN B-1 100 MG PO TABS
100.0000 mg | ORAL_TABLET | Freq: Every day | ORAL | Status: DC
  Administered 2017-04-06 – 2017-04-12 (×6): 100 mg via ORAL
  Filled 2017-04-05 (×6): qty 1

## 2017-04-05 MED ORDER — POTASSIUM CHLORIDE CRYS ER 20 MEQ PO TBCR
40.0000 meq | EXTENDED_RELEASE_TABLET | Freq: Once | ORAL | Status: AC
  Administered 2017-04-05: 40 meq via ORAL
  Filled 2017-04-05: qty 2

## 2017-04-05 NOTE — Progress Notes (Signed)
Physical Therapy Treatment Patient Details Name: Christopher ReichertManuel Shaffer MRN: [id number] DOB: 1965/05/20 Today's Date: 04/05/2017    History of Present Illness 52 yo male admitted with ETOH W/D. Hx of ETOH abuse, obesity, CKD, falls, SAH, DM, AAA.     PT Comments    Pt is much less functional today but has received meds for his agitation per nsg, and this is decreasing standing stability.  He will continue on with PT and hope to get past this need for meds and will progress to gait with more I.  Have worked on Engineer, building servicessafety awareness and will reinforce this with pt, esp as he is hoping to go directly home.   Follow Up Recommendations  Home health PT;Supervision/Assistance - 24 hour     Equipment Recommendations  Rolling walker with 5" wheels    Recommendations for Other Services       Precautions / Restrictions Precautions Precautions: Fall Precaution Comments: pt is not following instructions well, ataxic LE's and taking meds to decrease behavior Restrictions Weight Bearing Restrictions: No    Mobility  Bed Mobility Overal bed mobility: Needs Assistance Bed Mobility: Supine to Sit;Sit to Supine     Supine to sit: Min guard Sit to supine: Min assist   General bed mobility comments: assist to lift legs to bed to return to bed  Transfers Overall transfer level: Needs assistance Equipment used: Rolling walker (2 wheeled);1 person hand held assist Transfers: Sit to/from Stand Sit to Stand: Mod assist         General transfer comment: assisted to power up and to control balance in a flexed posture and with walker, feet sliding  Ambulation/Gait             General Gait Details: unsteady to attempt so did not   Stairs            Wheelchair Mobility    Modified Rankin (Stroke Patients Only)       Balance Overall balance assessment: Needs assistance Sitting-balance support: Feet supported;Bilateral upper extremity supported Sitting balance-Leahy Scale: Poor      Standing balance support: Bilateral upper extremity supported;During functional activity Standing balance-Leahy Scale: Poor                              Cognition Arousal/Alertness: Awake/alert Behavior During Therapy: Agitated;Impulsive Overall Cognitive Status: Impaired/Different from baseline Area of Impairment: Following commands;Safety/judgement;Awareness;Problem solving                       Following Commands: Follows one step commands with increased time;Follows one step commands inconsistently Safety/Judgement: Decreased awareness of safety;Decreased awareness of deficits Awareness: Intellectual Problem Solving: Slow processing;Difficulty sequencing;Requires verbal cues;Requires tactile cues General Comments: pt is not following cues well to set up legs for standing inside walker      Exercises      General Comments General comments (skin integrity, edema, etc.): pt is wet and incontinent and not helping PT to manage a urinal      Pertinent Vitals/Pain Pain Assessment: No/denies pain    Home Living                      Prior Function            PT Goals (current goals can now be found in the care plan section) Acute Rehab PT Goals Patient Stated Goal: home Progress towards PT goals: Not progressing toward goals - comment(has  received meds impacting function)    Frequency    Min 3X/week      PT Plan Current plan remains appropriate    Co-evaluation              AM-PAC PT "6 Clicks" Daily Activity  Outcome Measure  Difficulty turning over in bed (including adjusting bedclothes, sheets and blankets)?: Unable Difficulty moving from lying on back to sitting on the side of the bed? : Unable Difficulty sitting down on and standing up from a chair with arms (e.g., wheelchair, bedside commode, etc,.)?: Unable Help needed moving to and from a bed to chair (including a wheelchair)?: A Lot Help needed walking in hospital  room?: Total Help needed climbing 3-5 steps with a railing? : Total 6 Click Score: 7    End of Session Equipment Utilized During Treatment: Gait belt Activity Tolerance: Patient limited by fatigue;Treatment limited secondary to agitation Patient left: in bed;with call bell/phone within reach;with bed alarm set;with nursing/sitter in room Nurse Communication: Mobility status PT Visit Diagnosis: Difficulty in walking, not elsewhere classified (R26.2)     Time: 1610-9604 PT Time Calculation (min) (ACUTE ONLY): 29 min  Charges:  $Therapeutic Activity: 8-22 mins $Neuromuscular Re-education: 8-22 mins                    G Codes:  Functional Assessment Tool Used: AM-PAC 6 Clicks Basic Mobility     Ivar Drape 04/05/2017, 12:09 PM  Samul Dada, PT MS Acute Rehab Dept. Number: [facility] [id number] and [facility] [phone]

## 2017-04-05 NOTE — Progress Notes (Signed)
PROGRESS NOTE    [name]  GEX:[name] DOB: 25-Apr-1965 DOA: 04/03/2017 PCP: No primary care provider on file.    Brief Narrative:  52 year old male who presents with ambulatory dysfunction. Patient is known to have significant past medical history alcohol abuse. Last drink about 5 days prior to hospitalization, patient had developed tremors and difficulty ambulating. Apparently a neighbor called the police due to patient's poor condition. On the initial physical examination blood pressure 144/90, heart rate 113 229, respiratory 27, saturation 99%. Moist mucous membranes, lungs clear to auscultation bilaterally, heart S1-S2 present tachycardic, abdomen soft and nontender, no lower extremity edema. Sodium 132, potassium 3.2, chloride, bicarbonate 25, glucose 219, BUN 6, creatinine 0.8, white count 6.8, hemoglobin 11.4, hematocrit 32.3, platelets 122. EKG sinus rhythm, 62 as per minute, normal intervals and normal axis. Chest x-ray with left rotation, no infiltrates. Urine analysis negative for infection, urine drug screen negative, alcohol level less than 10. Head CT with small left posterior scalp hematoma without skull fracture or intracranial hemorrhage. Mild to moderate diffuse cerebral and cerebellar atrophy. 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   Patient was admitted to the hospital working diagnosis of alcohol withdrawal syndrome.  Assessment & Plan:   Principal Problem:   Alcohol withdrawal (HCC) Active Problems:   DM2 (diabetes mellitus, type 2) (HCC)   1. Alcohol withdrawal syndrome. Patient has developed worsening withdrawal symptoms, last night and this am agitated and combative. Will continue as needed lorazepam per protocol, and will add 2 mg of Haldol as needed. Patient not tolerating IV due to agitation, will attempt to resume IV fluids once patient is more calm. Poor oral intake. Continue one to one sitter for patient's safety.  Continue home dose of keppra. Continue thiamine.   2. T2DM. Capillary glucose 133, 158, 132, 134, 129. Will continue glucose cover and monitoring with insulin sliding scale.    3. Ambulatory dysfunction. Significant dysfunction due to encephalopathy and withdrawal symptoms, continue fall precautions.   4. Depression. Continue fluoxetine.   5. HTN. Will continue hctz and lisinopril.   6. Hypokalemia. K repletion with kcl 40 meq, serum K at 3,6 with serum cr at 0.70 and serum bicarbonate at 25.    DVT prophylaxis: enoxapatin  Code Status:  full Family Communication: no family at the bedside Disposition Plan: home   Consultants:     Procedures:     Antimicrobials   Subjective: Last night patient very confused and agitated, used 12 mg lorazepam. This am patient very confused and disorientated, at times combative, no nausea or vomiting, no chest pain or dyspnea. Has one to one sitter.   Objective: Vitals:   04/04/17 0417 04/04/17 1518 04/04/17 2053 04/05/17 0301  BP: (!) 152/84 140/62 (!) 145/91 (!) 167/101  Pulse: (!) 101 100 100 (!) 109  Resp: 18  18 20   Temp:  98.2 F (36.8 C) 100.1 F (37.8 C) 98.6 F (37 C)  TempSrc: Oral Oral Oral Oral  SpO2: 98% 97% 99% 100%  Weight:      Height:       No intake or output data in the 24 hours ending 04/05/17 0907 Filed Weights   04/03/17 1414 04/03/17 2130  Weight: 106.6 kg (235 lb) 103.8 kg (228 lb 13.4 oz)    Examination:   General: Deconditioned and confused Neurology: Awake, but disorientated E ENT: =mild pallor, no icterus, oral mucosa dry Cardiovascular: No JVD. S1-S2 present, rhythmic, no gallops, rubs, or murmurs. No lower extremity edema. Pulmonary:  vesicular breath sounds bilaterally, adequate air movement, no wheezing, rhonchi or rales. Gastrointestinal. Abdomen distended, no organomegaly, non tender, no rebound or guarding Skin. No rashes, multiple ecchymosis Musculoskeletal: no joint  deformities     Data Reviewed: I have personally reviewed following labs and imaging studies  CBC: Recent Labs  Lab 04/03/17 1445 04/04/17 0600  WBC 6.8 5.1  HGB 11.4* 11.0*  HCT 32.3* 32.5*  MCV 97.6 99.7  PLT 122* 104*   Basic Metabolic Panel: Recent Labs  Lab 04/03/17 1445 04/04/17 0600  NA 132* 136  K 3.2* 3.6  CL 95* 102  CO2 25 25  GLUCOSE 219* 111*  BUN 6 6  CREATININE 0.88 0.70  CALCIUM 8.3* 8.1*   GFR: Estimated Creatinine Clearance: 133.1 mL/min (by C-G formula based on SCr of 0.7 mg/dL). Liver Function Tests: No results for input(s): AST, ALT, ALKPHOS, BILITOT, PROT, ALBUMIN in the last 168 hours. No results for input(s): LIPASE, AMYLASE in the last 168 hours. No results for input(s): AMMONIA in the last 168 hours. Coagulation Profile: No results for input(s): INR, PROTIME in the last 168 hours. Cardiac Enzymes: No results for input(s): CKTOTAL, CKMB, CKMBINDEX, TROPONINI in the last 168 hours. BNP (last 3 results) No results for input(s): PROBNP in the last 8760 hours. HbA1C: No results for input(s): HGBA1C in the last 72 hours. CBG: Recent Labs  Lab 04/04/17 0746 04/04/17 1253 04/04/17 1737 04/04/17 2053 04/05/17 0726  GLUCAP 108* 133* 158* 132* 134*   Lipid Profile: No results for input(s): CHOL, HDL, LDLCALC, TRIG, CHOLHDL, LDLDIRECT in the last 72 hours. Thyroid Function Tests: No results for input(s): TSH, T4TOTAL, FREET4, T3FREE, THYROIDAB in the last 72 hours. Anemia Panel: No results for input(s): VITAMINB12, FOLATE, FERRITIN, TIBC, IRON, RETICCTPCT in the last 72 hours.    Radiology Studies: I have reviewed all of the imaging during this hospital visit personally     Scheduled Meds: . enoxaparin (LOVENOX) injection  40 mg Subcutaneous Q24H  . FLUoxetine  20 mg Oral Daily  . folic acid  1 mg Oral Daily  . gabapentin  300 mg Oral TID  . hydrochlorothiazide  25 mg Oral Daily  . insulin aspart  0-9 Units Subcutaneous TID WC   . levETIRAcetam  500 mg Oral BID  . lisinopril  20 mg Oral Daily  . LORazepam  0-4 mg Oral Q6H   Followed by  . LORazepam  0-4 mg Oral Q12H  . multivitamin with minerals  1 tablet Oral Daily   Continuous Infusions:   LOS: 1 day        Christopher Annett Gula, MD [facility] Pager [phone]

## 2017-04-05 NOTE — Progress Notes (Signed)
Pt still repeatedly trying to get out of bed, throwing legs over the side rails and stating he needs his keys to go home and go to work. Pt redirected, has a Recruitment consultantsafety sitter present in the room. Medications [facility] with no response to benzodiazepines. MD aware of pts status

## 2017-04-05 NOTE — Progress Notes (Signed)
Confused/restless, with very poor safety awarness throughout shift. Refusing to wear Telemonitior Total ativan dosing 12mg  slept less than 1 hour.  Frequent repositioning, redirection required. Safety sitter ordered for 0700

## 2017-04-05 NOTE — Consult Note (Addendum)
.. ..  Name: Christopher ReichertManuel Shaffer MRN: [id number] DOB: 1965/09/09    ADMISSION DATE:  04/03/2017 CONSULTATION DATE:  04/05/17  REFERRING MD :  Ella JubileeARRIEN MD  CHIEF COMPLAINT:  AGITATION AND RESTLESSNESS  BRIEF PATIENT DESCRIPTION: 52 yr old male with PMHx ETOH abuse consult for   SIGNIFICANT EVENTS  Increased agitation this afternoon  STUDIES:  none   HISTORY OF PRESENT ILLNESS:  52 yr old male with PMHx of CKD II, HTN, Obesity, HLD, NIDDM, ETOH abuse. Last documented drink was on 03/31/17 he presented on admission with tremor and balance issues. Being managed for ETOH withdrawal.  Per documentation he had worsening symptoms overnight on 04/04/17 and Haldol was added to his regimen. PCCM was consulted for possible Precedex infusion.  Patient continues on 1to1 watch. On my evaluation patient is sleeping, 1to1 present at bedside. I was able to awaken patient with verbal stimuli. 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 He is not attempting to get out of bed and not restless. During my exam he continues to fall back into sleep. He only reports left elbow pain. BP 148/90 HR 102   PAST MEDICAL HISTORY :   has a past medical history of Abdominal aneurysm (HCC), Chronic kidney disease, stage III (moderate) (HCC), Hypertension, Obesity (BMI 30-39.9), Other and unspecified hyperlipidemia, and Type II or unspecified type diabetes mellitus without mention of complication, not stated as uncontrolled.  has a past surgical history that includes stomach vessel aneurysm. Prior to Admission medications   Medication Sig Start Date End Date Taking? Authorizing Provider  Canagliflozin (INVOKANA PO) Take 300 mg by mouth daily.    Yes [provider]  colesevelam (WELCHOL) 625 MG tablet Take 625 mg by mouth 3 (three) times daily.   Yes [provider]  FLUoxetine (PROZAC) 20 MG tablet Take 20 mg by mouth daily.   Yes [provider]    folic acid (FOLVITE) 1 MG tablet Take 1 tablet (1 mg total) by mouth daily. 01/16/15  Yes Alison Murrayevine, Alma M, MD  gabapentin (NEURONTIN) 300 MG capsule Take 1 capsule (300 mg total) by mouth 3 (three) times daily. 03/24/17  Yes Charm RingsLord, Jamison Y, NP  levETIRAcetam (KEPPRA) 500 MG tablet Take 1 tablet (500 mg total) by mouth 2 (two) times daily. 07/18/16  Yes Wendee BeaversMcMullen, David J, DO  lisinopril-hydrochlorothiazide (PRINZIDE,ZESTORETIC) 20-25 MG tablet Take 1 tablet by mouth daily.   Yes [provider]  metFORMIN (GLUCOPHAGE) 500 MG tablet Take 1 tablet (500 mg total) by mouth 2 (two) times daily with a meal. 01/16/15  Yes Alison Murrayevine, Alma M, MD   No Known Allergies  FAMILY HISTORY:  family history includes Diabetes Mellitus II in his mother. SOCIAL HISTORY:  reports that he is a non-smoker but has been exposed to tobacco smoke. he has never used smokeless tobacco. He reports that he drinks about 7.2 oz of alcohol per week. He reports that he does not use drugs.  REVIEW OF SYSTEMS:  Pertinent positive are bolded Constitutional: Negative for fever, chills, weight loss, malaise/fatigue and diaphoresis.  HENT: Negative for hearing loss, ear pain, nosebleeds, congestion, sore throat, neck pain, tinnitus and ear discharge.   Eyes: Negative for blurred vision, double vision, photophobia, pain, discharge and redness.  Respiratory: Negative for cough, hemoptysis, sputum production, shortness of breath, wheezing and stridor.   Cardiovascular: Negative for chest pain, palpitations, orthopnea, claudication, leg swelling and PND.  Gastrointestinal: Negative for heartburn, nausea, vomiting, abdominal  pain, diarrhea, constipation, blood in stool and melena.  Genitourinary: Negative for dysuria, urgency, frequency, hematuria and flank pain.  Musculoskeletal: Negative for myalgias, back pain, left elbow joint pain and falls.  Skin: Negative for itching and rash.  Neurological: Negative for dizziness, tingling,  tremors, sensory change, speech change, focal weakness, seizures, loss of consciousness, weakness and headaches.  Endo/Heme/Allergies: Negative for environmental allergies and polydipsia. Does not bruise/bleed easily.  SUBJECTIVE:   VITAL SIGNS: Temp:  [98.6 F (37 C)-98.7 F (37.1 C)] 98.7 F (37.1 C) (01/15 1923) Pulse Rate:  [102-130] 102 (01/15 1923) Resp:  [20-24] 21 (01/15 1923) BP: (133-167)/(78-101) 148/90 (01/15 1923) SpO2:  [96 %-100 %] 96 % (01/15 1923)  PHYSICAL EXAMINATION: General:  In NAD Neuro:  Answering questions appropiately, right eye limited EOM no other focal deficits HEENT:  Normocephalic, bruising noted in medial portion of right eye. Tongue fasciculations Cardiovascular:  S1 and S2 appreciated HR 102 Lungs:  Clear to auscultation bilaterally no wheezing Abdomen:  Soft obese abdomen NT with + BS Musculoskeletal:  No gross deformities Skin:  Lots of fresh scabs/excoriations all over extremities and on left side of face.  Recent Labs  Lab 04/03/17 1445 04/04/17 0600  NA 132* 136  K 3.2* 3.6  CL 95* 102  CO2 25 25  BUN 6 6  CREATININE 0.88 0.70  GLUCOSE 219* 111*   Recent Labs  Lab 04/03/17 1445 04/04/17 0600  HGB 11.4* 11.0*  HCT 32.3* 32.5*  WBC 6.8 5.1  PLT 122* 104*   No results found.  ASSESSMENT / PLAN: NEURO: ETOH Withdrawal Continue on Benzodiazipienes Patient is controlled on my exam Some confusion but no active hallucinations No need for Precedex at this time Also discussed with nurse and he states pt is much better than previous night GCS 14 Adjusted Ativan orders to 2 mg Q 2 hrs PRN for agitation or increased CIWA Continue Thiamine. H/o seizure activity- on Keppra  CARDIAC: H/o HTN Continue on thiazide and lisinopril Keep on cardiac monitor - pt agrees to wear it Reviewed EKG from 1/13 QRS 84ms QTc 449 ms   Endocrine: H/o NIDDM Continue on SSI With goal BG 140-180mg /dl   GI: HOB elevated >40 deg Aspiration  precautions    I, Dr Newell Coral have personally reviewed patient's available data, including medical history, events of note, physical examination and test results as part of my evaluation. I have discussed with RN and other care providers. The patient at the time of my evaluation is stable to remain on current floor and does not require management with precedex ggt at this time.  The management of this patient does not require the extensive monitoring and management that can be achieved within a critical care setting. Consult time devoted to patient care services described in this note is 45 Minutes. This time reflects time of care of this signee Dr Newell Coral. This critical care time does not reflect procedure time, or teaching time or supervisory time  but could involve care discussion time    DISPOSITION: remains on current floor Consult TIME: 45 mins PROGNOSIS: Guarded FAMILY: none at bedside  If clinical condition should change/worsen feel free to re-consult  Signed Dr Newell Coral Pulmonary Critical Care Locums   04/05/2017, 9:19 PM ;

## 2017-04-05 NOTE — Progress Notes (Signed)
Patient still very agitated despite lorazepam and haldol. Had 10 mg total during the day. Will give 4 mg of IV lorazepam bolus and continue close monitoring. May need Precedex infusion if continue unresponsive to benzodiazepines.

## 2017-04-05 NOTE — Clinical Social Work Note (Signed)
Clinical Social Work Assessment  Patient Details  Name: [name] MRN: [id number] Date of Birth: January 05, 1966  Date of referral:  04/05/17               Reason for consult:  Substance Use/ETOH Abuse                Permission sought to share information with:    Permission granted to share information::  No  Name::        Agency::     Relationship::     Contact Information:     Housing/Transportation Living arrangements for the past 2 months:  Single Family Home Source of Information:  Patient Patient Interpreter Needed:  None Criminal Activity/Legal Involvement Pertinent to Current Situation/Hospitalization:  Yes Significant Relationships:  None Lives with:  Self Do you feel safe going back to the place where you live?  Yes Need for family participation in patient care:  Yes (Comment)  Care giving concerns: Patient reports that he is recently seperated from his wife. Patient has had 6 hospital visits relating to ETOH and police involvement 6 times since 01/23/17.    Social Worker assessment / plan:  LCSW consulted for SA.  Patient admitted for alcohol withdrawals.  LCSW met with patient at bedside. No family represent. Patient has sitter due to safety concerns.   Patient is confused and disoriented.   Patient reports that he has issues with alcohol and has been drinking for a long time, but could not give a definite timeline.   Patient reports that his drink of preference is wine.   Patient reports that he recently separated from his wife of 23 years. He stated that she moved out. He is still living in the family home and looking for a roommate. Patient reports that he has 2 children.   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   According to patients RN over the past 3 months patient has been to the hospital several times for ETOH and police involvement. On this admission patient reports that he had an incident with the police 3-4 days prior to  his admission.   Patient started to fall asleep during assessment. LCSW will follow up with patient to continue discussion of SA resources.   PLAN: TBD    Employment status:  Therapist, music:  Managed Care PT Recommendations:  Home with Home Health, 24 Hour Supervision Information / Referral to community resources:  Outpatient Substance Abuse Treatment Options, Inpatient Psychiatric Care (Comment Required)  Patient/Family's Response to care:  Unable to access.   Patient/Family's Understanding of and Emotional Response to Diagnosis, Current Treatment, and Prognosis: Patient was very drowsy and difficult to assess. No permission was given to speak with family.   Emotional Assessment Appearance:  Disheveled, Appears stated age Attitude/Demeanor/Rapport:  Sedated Affect (typically observed):  Frustrated Orientation:  Oriented to Self Alcohol / Substance use:  Alcohol Use Psych involvement (Current and /or in the community):  No (Comment)  Discharge Needs  Concerns to be addressed:    Readmission within the last 30 days:  No Current discharge risk:  Substance Abuse, Legal Concerns, Lack of support system Barriers to Discharge:  Continued Medical Work up   [employer], LCSW 04/05/2017, 10:31 AM

## 2017-04-06 LAB — BASIC METABOLIC PANEL
ANION GAP: 9 (ref 5–15)
BUN: 11 mg/dL (ref 6–20)
CALCIUM: 8.8 mg/dL — AB (ref 8.9–10.3)
CO2: 26 mmol/L (ref 22–32)
CREATININE: 0.72 mg/dL (ref 0.61–1.24)
Chloride: 97 mmol/L — ABNORMAL LOW (ref 101–111)
Glucose, Bld: 100 mg/dL — ABNORMAL HIGH (ref 65–99)
Potassium: 3.2 mmol/L — ABNORMAL LOW (ref 3.5–5.1)
SODIUM: 132 mmol/L — AB (ref 135–145)

## 2017-04-06 LAB — GLUCOSE, CAPILLARY
GLUCOSE-CAPILLARY: 113 mg/dL — AB (ref 65–99)
Glucose-Capillary: 89 mg/dL (ref 65–99)
Glucose-Capillary: 104 mg/dL — ABNORMAL HIGH (ref 65–99)
Glucose-Capillary: 180 mg/dL — ABNORMAL HIGH (ref 65–99)

## 2017-04-06 LAB — MRSA PCR SCREENING: MRSA by PCR: POSITIVE — AB

## 2017-04-06 MED ORDER — MUPIROCIN 2 % EX OINT
1.0000 "application " | TOPICAL_OINTMENT | Freq: Two times a day (BID) | CUTANEOUS | Status: AC
  Administered 2017-04-06 – 2017-04-11 (×10): 1 via NASAL
  Filled 2017-04-06: qty 22

## 2017-04-06 MED ORDER — CHLORHEXIDINE GLUCONATE CLOTH 2 % EX PADS
6.0000 | MEDICATED_PAD | Freq: Every day | CUTANEOUS | Status: AC
  Administered 2017-04-07 – 2017-04-11 (×5): 6 via TOPICAL

## 2017-04-06 MED ORDER — PIPERACILLIN-TAZOBACTAM 3.375 G IVPB
3.3750 g | Freq: Three times a day (TID) | INTRAVENOUS | Status: DC
  Administered 2017-04-06 – 2017-04-07 (×4): 3.375 g via INTRAVENOUS
  Filled 2017-04-06 (×4): qty 50

## 2017-04-06 MED ORDER — POTASSIUM CHLORIDE CRYS ER 20 MEQ PO TBCR
40.0000 meq | EXTENDED_RELEASE_TABLET | Freq: Once | ORAL | Status: AC
  Administered 2017-04-06: 40 meq via ORAL
  Filled 2017-04-06: qty 2

## 2017-04-06 MED ORDER — DEXMEDETOMIDINE HCL IN NACL 200 MCG/50ML IV SOLN: 0.4000 ug/kg/h | INTRAVENOUS | Status: DC

## 2017-04-06 NOTE — Progress Notes (Signed)
Chaplain following for support due to spiritual care consult.  Will work to make contact with patient as confusion abates.    [facility] / [facility] Chaplain Burnis KingfisherMatthew Tajai Ihde, MDiv

## 2017-04-06 NOTE — Progress Notes (Signed)
Pharmacy Antibiotic Note  Christopher ReichertManuel Shaffer is a 52 y.o. male admitted on 04/03/2017.  Pharmacy has been consulted for Zosyn dosing for tibial wound  Plan: Zosyn 3.375g IV Q8H infused over 4hrs. Follow up renal fxn, culture results, and clinical course.   Height: 6' (182.9 cm) Weight: 228 lb 13.4 oz (103.8 kg) IBW/kg (Calculated) : 77.6  Temp (24hrs), Avg:98.5 F (36.9 C), Min:98.2 F (36.8 C), Max:98.7 F (37.1 C)  Recent Labs  Lab 04/03/17 1445 04/04/17 0600 04/06/17 0604  WBC 6.8 5.1  --   CREATININE 0.88 0.70 0.72    Estimated Creatinine Clearance: 133.1 mL/min (by C-G formula based on SCr of 0.72 mg/dL).    No Known Allergies  Thank you for allowing pharmacy to be a part of this patient's care.  [name], [name] [name] 04/06/2017 1:51 PM

## 2017-04-06 NOTE — Consult Note (Signed)
PULMONARY / CRITICAL CARE MEDICINE   Name: [name] MRN: [id number] DOB: [birth year]    ADMISSION DATE:  04/03/2017 CONSULTATION DATE:  04/06/17   REFERRING MD:  Dr. Izola PriceMyers  CHIEF COMPLAINT: Agitation  HISTORY OF PRESENT ILLNESS: Patient is a 52 y/o male who presented to the ED for unstable gait and tremors. He was recently seen in the ED (1/2) brought in by EMS after getting into a physical altercation with the police for a DUI. He has a known history of alcohol abuse with last known drink was around 1/8. Initial labs showed elevated glucose and mildly low electrolytes, hemoglobin of 11.4 and Hct of 32.3, UDS was negative, and alcohol level <10. He was admitted 1/13 for alcohol withdrawal syndrome. He became very agitated and was difficult to manage despite aggressive use of ativan and haldol. PCCM was consulted for possible need of precedex.   PAST MEDICAL HISTORY :  He  has a past medical history of Abdominal aneurysm (HCC), Chronic kidney disease, stage III (moderate) (HCC), Hypertension, Obesity (BMI 30-39.9), Other and unspecified hyperlipidemia, and Type II or unspecified type diabetes mellitus without mention of complication, not stated as uncontrolled.  PAST SURGICAL HISTORY: He  has a past surgical history that includes stomach vessel aneurysm.  No Known Allergies  No current facility-administered medications on file prior to encounter.    Current Outpatient Medications on File Prior to Encounter  Medication Sig  . Canagliflozin (INVOKANA PO) Take 300 mg by mouth daily.   . colesevelam (WELCHOL) 625 MG tablet Take 625 mg by mouth 3 (three) times daily.  Marland Kitchen. FLUoxetine (PROZAC) 20 MG tablet Take 20 mg by mouth daily.  . folic acid (FOLVITE) 1 MG tablet Take 1 tablet (1 mg total) by mouth daily.  Marland Kitchen. gabapentin (NEURONTIN) 300 MG capsule Take 1 capsule (300 mg total) by mouth 3 (three) times daily.  Marland Kitchen. levETIRAcetam (KEPPRA) 500 MG tablet Take 1 tablet (500 mg total) by mouth  2 (two) times daily.  Marland Kitchen. lisinopril-hydrochlorothiazide (PRINZIDE,ZESTORETIC) 20-25 MG tablet Take 1 tablet by mouth daily.  . metFORMIN (GLUCOPHAGE) 500 MG tablet Take 1 tablet (500 mg total) by mouth 2 (two) times daily with a meal.    FAMILY HISTORY:  His indicated that the status of his mother is unknown.   SOCIAL HISTORY: He  reports that he is a non-smoker but has been exposed to tobacco smoke. he has never used smokeless tobacco. He reports that he drinks about 7.2 oz of alcohol per week. He reports that he does not use drugs.  REVIEW OF SYSTEMS:   HEENT: Denies eye pain, headache, vision changes, hallucinations CV: Denies chest pain, edema  Pulm: Denies difficulty breathing, shortness of breath Abd: Denies abdominal pain, nausea MSK: Admits left elbow pain  SUBJECTIVE:  Patient states he is feeling better, has no complaints with the exception of some left elbow pain  VITAL SIGNS: BP 112/63 (BP Location: Right Arm)   Pulse (!) 105   Temp 98.2 F (36.8 C) (Oral)   Resp (!) 23   Ht 6' (1.829 m)   Wt 228 lb 13.4 oz (103.8 kg)   SpO2 96%   BMI 31.04 kg/m   HEMODYNAMICS:    VENTILATOR SETTINGS:    INTAKE / OUTPUT: I/O last 3 completed shifts: In: 600 [P.O.:600] Out: 700 [Urine:700]  PHYSICAL EXAMINATION: General: Subdued, sitting in bed and eating with assistance Neuro: Flattened affect, alert, follows commands HEENT: Rt prosthetic eye, left eye EOM intact, there are some superficial  scrapes that are healing on his forehead and cheek  Cardiovascular: RRR without murmurs, no edema present in bilateral LE Lungs: CTA bilaterally, normal work of breathing without accessory muscle use Abdomen: Non tender, soft, normo active BS in all quadrants Musculoskeletal: No pain to palpation of the left elbow Skin: Warm, dry with superficial scrapes that are healing on his legs and arms.  RLE dressing below knee c/d/i  LABS:  BMET Recent Labs  Lab 04/03/17 1445  04/04/17 0600 04/06/17 0604  NA 132* 136 132*  K 3.2* 3.6 3.2*  CL 95* 102 97*  CO2 25 25 26   BUN 6 6 11   CREATININE 0.88 0.70 0.72  GLUCOSE 219* 111* 100*    Electrolytes Recent Labs  Lab 04/03/17 1445 04/04/17 0600 04/06/17 0604  CALCIUM 8.3* 8.1* 8.8*    CBC Recent Labs  Lab 04/03/17 1445 04/04/17 0600  WBC 6.8 5.1  HGB 11.4* 11.0*  HCT 32.3* 32.5*  PLT 122* 104*    Coag's No results for input(s): APTT, INR in the last 168 hours.  Sepsis Markers No results for input(s): LATICACIDVEN, PROCALCITON, O2SATVEN in the last 168 hours.  ABG No results for input(s): PHART, PCO2ART, PO2ART in the last 168 hours.  Liver Enzymes No results for input(s): AST, ALT, ALKPHOS, BILITOT, ALBUMIN in the last 168 hours.  Cardiac Enzymes No results for input(s): TROPONINI, PROBNP in the last 168 hours.  Glucose Recent Labs  Lab 04/05/17 1143 04/05/17 1653 04/05/17 2250 04/06/17 0752 04/06/17 1131 04/06/17 1250  GLUCAP 129* 126* 95 89 104* 113*    Imaging No results found.   STUDIES:  CXR 1/13 >> no active cardiopulmonary disease CT Head 1/13 >> Small left hematoma without fracture or hemorrhage; mild to moderate diffuse cerebral/cerebellar atrophy   CULTURES: MRSA PCR 1/16 >>  ANTIBIOTICS: Zosyn 1/16 >>  SIGNIFICANT EVENTS: 1/13 >> Admitted to hospital 1/16 >> found to have possibly infected wound to his right tibia  LINES/TUBES: none  DISCUSSION: Patient is a 52 y/o male with known alcohol abuse admitted for alcohol withdrawal syndrome. He was difficult to manage on standard BZDs and required closer observation with possible need for precedex.   ASSESSMENT / PLAN:  NEUROLOGIC A:   Alcohol Withdrawal Syndrome P:   Based on PE, patient does not appear to need precedex at this time. He is eating and talking without aggression and does not seem agitated.  Continue Ativan and Haldol PRN per CIWA protocol  Sitter at bedside Continue to monitor  behavior   FAMILY  - Updates: no family at bedside   Willa Frater, PA-S   PCCM will be available if needed for precedex.    Canary Brim, NP-C [facility] Pgr: [phone] or if no answer [phone] 04/06/2017, 3:51 PM

## 2017-04-06 NOTE — Progress Notes (Signed)
Pt and pt belongings transferred to SDU via bed. Pts hospital sitter present with pt. Pt denied pain with no s/s of distress noted. Pt is still confused and agitated at times.

## 2017-04-06 NOTE — Progress Notes (Addendum)
Patient ID: [name] [place], male   DOB: Aug 20, 1965, 52 y.o.   MRN: [id number]    PROGRESS NOTE  [name] Vowles  WUJ:[id number] DOB: Aug 20, 1965 DOA: 04/03/2017  PCP: No primary care provider on file.   Brief Narrative:  Patient is 52 year old male with known chronic kidney disease stage II, hypertension, obesity, hyperlipidemia, alcohol abuse, last documented drink apparently on 03/31/2017, presented with tremors and balance issues. He has been admitted for alcohol withdrawal. Patient has been on CIWA protocol, was given additional Haldol for agitation but continues to remain agitated, getting out of the bed, hallucinating. Has been getting Ativan IV 2 mg every 2-3 hours with intermittent Haldol 2 mg IV. Critical care was consulted overnight for consideration of Precedex infusion.  Assessment & Plan:   Principal Problem:   Alcohol withdrawal (HCC) - Currently not responding to Ativan and Haldol given per CIWA protocol - Remains agitated, has mittens but still trying to pull lines and get out of the bed - Transferred to stepdown unit for closer monitoring and possible initiation of Precedex drip   Active Problems:   Acute metabolic encephalopathy - from alcohol withdrawal - monitor in SDU     Right tibial wound - looks infected, will order wound care consult - place on Zosyn for now     DM2 (diabetes mellitus, type 2) (HCC) - Tolerating breakfast well this morning - Continue with sliding scale insulin    Tachycardia - Suspect secondary to withdrawal - Keep on monitor    Depression - Continue fluoxetine    Hypertensive - Continue lisinopril and hydrochlorothiazide   Thrombocytopenia - Likely alcohol induced, will monitor     Hypokalemia - Supplement and repeat BMP in the morning      Obesity - Body mass index is 31.04 kg/m.  DVT prophylaxis: Lovenox SQ Code Status: Full code Family Communication: Patient at bedside  Disposition Plan: Transfer to stepdown  unit  Consultants:   PCCM  Procedures:   None   Antimicrobials:   None  Subjective: Remains agitated, hallucinating.   Objective: Vitals:   04/05/17 1526 04/05/17 1923 04/05/17 2251 04/06/17 0514  BP: 133/78 (!) 148/90 (!) 140/91 (!) 142/91  Pulse: (!) 130 (!) 102 100 99  Resp: (!) 24 (!) 21 (!) 22 20  Temp: 98.7 F (37.1 C) 98.7 F (37.1 C) 98.4 F (36.9 C) 98.2 F (36.8 C)  TempSrc: Oral  Oral Oral  SpO2: 96% 96% 97% 97%  Weight:      Height:        Intake/Output Summary (Last 24 hours) at 04/06/2017 1330 Last data filed at 04/06/2017 0847 Gross per 24 hour  Intake 480 ml  Output 700 ml  Net -220 ml   Filed Weights   04/03/17 1414 04/03/17 2130  Weight: 106.6 kg (235 lb) 103.8 kg (228 lb 13.4 oz)    Examination:  General exam: Appears agitated but not getting out of the bed  Respiratory system: Clear to auscultation. Respiratory effort normal. Cardiovascular system: S1 & S2 heard, RRR. No JVD, murmurs Gastrointestinal system: Abdomen is nondistended, soft and nontender. Normal bowel sounds heard. Central nervous system: Alert, follows commands, moving all 4 extremities  Musculoskeletal: right tibial wound with purulent drainage   Data Reviewed: I have personally reviewed following labs and imaging studies  CBC: Recent Labs  Lab 04/03/17 1445 04/04/17 0600  WBC 6.8 5.1  HGB 11.4* 11.0*  HCT 32.3* 32.5*  MCV 97.6 99.7  PLT 122* 104*   Basic Metabolic Panel: Recent  Labs  Lab 04/03/17 1445 04/04/17 0600 04/06/17 0604  NA 132* 136 132*  K 3.2* 3.6 3.2*  CL 95* 102 97*  CO2 25 25 26   GLUCOSE 219* 111* 100*  BUN 6 6 11   CREATININE 0.88 0.70 0.72  CALCIUM 8.3* 8.1* 8.8*   CBG: Recent Labs  Lab 04/05/17 1653 04/05/17 2250 04/06/17 0752 04/06/17 1131 04/06/17 1250  GLUCAP 126* 95 89 104* 113*   Urine analysis:    Component Value Date/Time   COLORURINE AMBER (A) 04/03/2017 1630   APPEARANCEUR CLEAR 04/03/2017 1630   LABSPEC 1.008  04/03/2017 1630   PHURINE 9.0 (H) 04/03/2017 1630   GLUCOSEU 50 (A) 04/03/2017 1630   HGBUR SMALL (A) 04/03/2017 1630   BILIRUBINUR NEGATIVE 04/03/2017 1630   KETONESUR NEGATIVE 04/03/2017 1630   PROTEINUR 100 (A) 04/03/2017 1630   UROBILINOGEN 0.2 01/15/2015 2117   NITRITE NEGATIVE 04/03/2017 1630   LEUKOCYTESUR NEGATIVE 04/03/2017 1630   Radiology Studies: No results found.   Scheduled Meds: . enoxaparin (LOVENOX) injection  40 mg Subcutaneous Q24H  . FLUoxetine  20 mg Oral Daily  . folic acid  1 mg Oral Daily  . gabapentin  300 mg Oral TID  . hydrochlorothiazide  25 mg Oral Daily  . insulin aspart  0-9 Units Subcutaneous TID WC  . levETIRAcetam  500 mg Oral BID  . lisinopril  20 mg Oral Daily  . LORazepam  2 mg Intravenous Q2H  . LORazepam  0-4 mg Oral Q12H  . multivitamin with minerals  1 tablet Oral Daily  . thiamine  100 mg Oral Daily   Continuous Infusions:   LOS: 2 days   Time spent: 35 minutes   Debbora Presto, MD [facility] Pager [phone]  If 7PM-7AM, please contact night-coverage [email or web address] Password [email or web address] 04/06/2017, 1:30 PM

## 2017-04-06 NOTE — Consult Note (Signed)
WOC Nurse wound consult note Reason for Consult: full thickness laceration to right LE Wound type:traumatic Pressure Injury POA: NA Measurement: 1.2cm x 4cm x 1cm Wound bed: 75% red, moist, 25% yellow slough Drainage (amount, consistency, odor) scant serous exudate Periwound: mild erythema to 0.4cm Dressing procedure/placement/frequency: I will provide Nursing staff with guidance for twice daily wound care to this acute wound with saline moistenend gauze dressings changed twice daily and secured with a few turns of kerlix roll gauze. WOC nursing team will not follow, but will remain available to this patient, the nursing and medical teams.  Please re-consult if needed. Thanks, Ladona MowLaurie Devontae Casasola, MSN, RN, GNP, Hans EdenCWOCN, CWON-AP, FAAN  Pager# [phone]

## 2017-04-06 NOTE — Progress Notes (Signed)
Restlessness/mild confusion throughout night Slept less than 1 hour.  Total 8mg  ativan/2mg  of haldol adm.

## 2017-04-07 LAB — COMPREHENSIVE METABOLIC PANEL
ALBUMIN: 2.9 g/dL — AB (ref 3.5–5.0)
ALK PHOS: 177 U/L — AB (ref 38–126)
ALT: 47 U/L (ref 17–63)
ANION GAP: 8 (ref 5–15)
AST: 118 U/L — AB (ref 15–41)
BUN: 20 mg/dL (ref 6–20)
CO2: 24 mmol/L (ref 22–32)
Calcium: 8.3 mg/dL — ABNORMAL LOW (ref 8.9–10.3)
Chloride: 100 mmol/L — ABNORMAL LOW (ref 101–111)
Creatinine, Ser: 1.34 mg/dL — ABNORMAL HIGH (ref 0.61–1.24)
GFR calc Af Amer: >60 mL/min (ref >60)
GFR calc non Af Amer: 59 mL/min — ABNORMAL LOW (ref >60)
GLUCOSE: 104 mg/dL — AB (ref 65–99)
POTASSIUM: 3.4 mmol/L — AB (ref 3.5–5.1)
SODIUM: 132 mmol/L — AB (ref 135–145)
Total Bilirubin: 4.3 mg/dL — ABNORMAL HIGH (ref 0.3–1.2)
Total Protein: 7.3 g/dL (ref 6.5–8.1)

## 2017-04-07 LAB — CBC
HEMATOCRIT: 32.8 % — AB (ref 39.0–52.0)
HEMOGLOBIN: 11.3 g/dL — AB (ref 13.0–17.0)
MCH: 34.2 pg — AB (ref 26.0–34.0)
MCHC: 34.5 g/dL (ref 30.0–36.0)
MCV: 99.4 fL (ref 78.0–100.0)
Platelets: 140 10*3/uL — ABNORMAL LOW (ref 150–400)
RBC: 3.3 MIL/uL — ABNORMAL LOW (ref 4.22–5.81)
RDW: 15 % (ref 11.5–15.5)
WBC: 6.1 10*3/uL (ref 4.0–10.5)

## 2017-04-07 LAB — GLUCOSE, CAPILLARY
GLUCOSE-CAPILLARY: 129 mg/dL — AB (ref 65–99)
GLUCOSE-CAPILLARY: 84 mg/dL (ref 65–99)
GLUCOSE-CAPILLARY: 128 mg/dL — AB (ref 65–99)
Glucose-Capillary: 103 mg/dL — ABNORMAL HIGH (ref 65–99)
Glucose-Capillary: 93 mg/dL (ref 65–99)
Glucose-Capillary: 152 mg/dL — ABNORMAL HIGH (ref 65–99)

## 2017-04-07 MED ORDER — LORAZEPAM 2 MG/ML IJ SOLN
1.0000 mg | INTRAMUSCULAR | Status: DC | PRN
  Administered 2017-04-07 – 2017-04-12 (×8): 1 mg via INTRAVENOUS
  Filled 2017-04-07 (×8): qty 1

## 2017-04-07 MED ORDER — SODIUM CHLORIDE 0.9 % IV SOLN
INTRAVENOUS | Status: DC
  Administered 2017-04-07: 23:00:00 via INTRAVENOUS

## 2017-04-07 MED ORDER — HYDRALAZINE HCL 20 MG/ML IJ SOLN: 5.0000 mg | INTRAMUSCULAR | Status: DC | PRN

## 2017-04-07 MED ORDER — LORAZEPAM 1 MG PO TABS
0.0000 mg | ORAL_TABLET | Freq: Two times a day (BID) | ORAL | Status: AC
  Administered 2017-04-08 (×2): 2 mg via ORAL
  Filled 2017-04-07: qty 4
  Filled 2017-04-07 (×2): qty 2

## 2017-04-07 MED ORDER — POTASSIUM CHLORIDE CRYS ER 20 MEQ PO TBCR
40.0000 meq | EXTENDED_RELEASE_TABLET | Freq: Once | ORAL | Status: AC
  Administered 2017-04-07: 40 meq via ORAL
  Filled 2017-04-07: qty 2

## 2017-04-07 MED ORDER — DOXYCYCLINE HYCLATE 100 MG PO TABS
100.0000 mg | ORAL_TABLET | Freq: Two times a day (BID) | ORAL | Status: DC
  Administered 2017-04-07 – 2017-04-12 (×9): 100 mg via ORAL
  Filled 2017-04-07 (×9): qty 1

## 2017-04-07 NOTE — Progress Notes (Signed)
PT Cancellation Note  Patient Details Name: Christopher ReichertManuel Shaffer MRN: [id number] DOB: 09-11-1965   Cancelled Treatment:    Reason Eval/Treat Not Completed: Patient's level of consciousness, currently sleeping after having medication for agitation. Per RN, check back later.   Rada HayHill, Dyesha Henault [name] 04/07/2017, 8:34 AM Blanchard KelchKaren Briceida Rasberry PT [phone]

## 2017-04-07 NOTE — Progress Notes (Addendum)
Patient ID: [name], male   DOB: 09-02-1965, 52 y.o.   MRN: [id number]    PROGRESS NOTE  [name]  WUJ:[id number] DOB: 07-25-1965 DOA: 04/03/2017  PCP: No primary care provider on file.   Brief Narrative:  Patient is 52 year old male with known chronic kidney disease stage II, hypertension, obesity, hyperlipidemia, alcohol abuse, last documented drink apparently on 03/31/2017, presented with tremors and balance issues. He has been admitted for alcohol withdrawal. Patient has been on CIWA protocol, was given additional Haldol for agitation but continues to remain agitated, getting out of the bed, hallucinating. Has been getting Ativan IV 2 mg every 2-3 hours with intermittent Haldol 2 mg IV. Critical care was consulted overnight for consideration of Precedex infusion.  Assessment & Plan:   Principal Problem:   Alcohol withdrawal (HCC) - has been on CIWA protocol and moved to SDU on 01/16 due to worsening agitation - now more somnolent - will lower the dose of Ativan from 2 mg to 1 mg and will give Q4 hours as needed  - keep in SDU for now  - no indication for precedex drip  Active Problems:   Acute metabolic encephalopathy - from alcohol withdrawal - somnolent and likely from Ativan - hold gabapentin until pt more alert  - will monitor in SDU     Right tibial wound - full thickness laceration to the right LE with mild surrounding erythema and serous drainage - appreciate WOC team assistance, plan is to provide wound care to this area with saline and moistened gauze dressing changed BID - stop zosyn, change to oral ABX doxycycline     Acute kidney injury - will provide IVF for 12 hours, as this is likely pre renal etiology - will repeat BMP in AM    DM2 (diabetes mellitus, type 2) (HCC) - keep on SSI for now     Tachycardia - suspect from withdrawal - will continue to monitor     Depression - continue Fluoxetine     Hypertensive - has been on lisinopril  and HCTZ - as Cr is up this AM, I will hold lisinopril  - BMP is stable this AM   Thrombocytopenia - Likely alcohol induced - better this AM - CBC in AM    Hypokalemia - will supplement and repeat BMP in Am      Obesity - Body mass index is 31.04 kg/m.  DVT prophylaxis: Lovenox SQ Code Status: Full code Family Communication: pt at bedside  Disposition Plan: to be determined   Consultants:   PCCM  Procedures:   None   Antimicrobials:   Zosyn 01/16 --> 01/17  Doxycycline 01/17 -->  Subjective: Pt somnolent but easy to awake, still confused.   Objective: Vitals:   04/07/17 1138 04/07/17 1200 04/07/17 1400 04/07/17 1600  BP:  100/62 114/75 129/73  Pulse:  98 92 (!) 104  Resp:  (!) 21 (!) 25 (!) 22  Temp: 98.2 F (36.8 C)     TempSrc: Oral     SpO2:  94% 97% 94%  Weight:      Height:        Intake/Output Summary (Last 24 hours) at 04/07/2017 1602 Last data filed at 04/07/2017 1332 Gross per 24 hour  Intake 990 ml  Output 1035 ml  Net -45 ml   Filed Weights   04/03/17 1414 04/03/17 2130 04/07/17 0300  Weight: 106.6 kg (235 lb) 103.8 kg (228 lb 13.4 oz) 97.3 kg (214 lb 8.1 oz)   Physical  Exam  Constitutional: Appears somnolent but easy to awake, able to follow some commands but still confused  CVS: tachycardic, S1/S2 +, no murmurs, no gallops, no carotid bruit.  Pulmonary: Effort and breath sounds normal, no stridor, rhonchi, wheezes, rales.  Abdominal: Soft. BS +,  no distension, tenderness, rebound or guarding.  Neuro: Moving all 4 extremities   Data Reviewed: I have personally reviewed following labs and imaging studies  CBC: Recent Labs  Lab 04/03/17 1445 04/04/17 0600 04/07/17 0316  WBC 6.8 5.1 6.1  HGB 11.4* 11.0* 11.3*  HCT 32.3* 32.5* 32.8*  MCV 97.6 99.7 99.4  PLT 122* 104* 140*   Basic Metabolic Panel: Recent Labs  Lab 04/03/17 1445 04/04/17 0600 04/06/17 0604 04/07/17 0316  NA 132* 136 132* 132*  K 3.2* 3.6 3.2* 3.4*  CL  95* 102 97* 100*  CO2 25 25 26 24   GLUCOSE 219* 111* 100* 104*  BUN 6 6 11 20   CREATININE 0.88 0.70 0.72 1.34*  CALCIUM 8.3* 8.1* 8.8* 8.3*   CBG: Recent Labs  Lab 04/06/17 1250 04/06/17 1904 04/07/17 0323 04/07/17 0750 04/07/17 1144  GLUCAP 113* 180* 103* 93 152*   Urine analysis:    Component Value Date/Time   COLORURINE AMBER (A) 04/03/2017 1630   APPEARANCEUR CLEAR 04/03/2017 1630   LABSPEC 1.008 04/03/2017 1630   PHURINE 9.0 (H) 04/03/2017 1630   GLUCOSEU 50 (A) 04/03/2017 1630   HGBUR SMALL (A) 04/03/2017 1630   BILIRUBINUR NEGATIVE 04/03/2017 1630   KETONESUR NEGATIVE 04/03/2017 1630   PROTEINUR 100 (A) 04/03/2017 1630   UROBILINOGEN 0.2 01/15/2015 2117   NITRITE NEGATIVE 04/03/2017 1630   LEUKOCYTESUR NEGATIVE 04/03/2017 1630   Radiology Studies: No results found.   Scheduled Meds: . Chlorhexidine Gluconate Cloth  6 each Topical Q0600  . enoxaparin (LOVENOX) injection  40 mg Subcutaneous Q24H  . FLUoxetine  20 mg Oral Daily  . folic acid  1 mg Oral Daily  . gabapentin  300 mg Oral TID  . hydrochlorothiazide  25 mg Oral Daily  . insulin aspart  0-9 Units Subcutaneous TID WC  . levETIRAcetam  500 mg Oral BID  . lisinopril  20 mg Oral Daily  . LORazepam  0-4 mg Oral Q12H  . multivitamin with minerals  1 tablet Oral Daily  . mupirocin ointment  1 application Nasal BID  . thiamine  100 mg Oral Daily   Continuous Infusions: . piperacillin-tazobactam (ZOSYN)  IV 3.375 g (04/07/17 1332)     LOS: 3 days   Time spent: 35 minutes   Debbora PrestoIskra Magick-Mort Smelser, MD [facility] Pager [phone]  If 7PM-7AM, please contact night-coverage [email or web address] Password [email or web address] 04/07/2017, 4:02 PM

## 2017-04-08 LAB — BASIC METABOLIC PANEL
Anion gap: 6 (ref 5–15)
BUN: 14 mg/dL (ref 6–20)
CHLORIDE: 102 mmol/L (ref 101–111)
CO2: 24 mmol/L (ref 22–32)
CREATININE: 0.9 mg/dL (ref 0.61–1.24)
Calcium: 8.5 mg/dL — ABNORMAL LOW (ref 8.9–10.3)
GFR calc Af Amer: >60 mL/min (ref >60)
GFR calc non Af Amer: >60 mL/min (ref >60)
GLUCOSE: 121 mg/dL — AB (ref 65–99)
Potassium: 4 mmol/L (ref 3.5–5.1)
SODIUM: 132 mmol/L — AB (ref 135–145)

## 2017-04-08 LAB — GLUCOSE, CAPILLARY
Glucose-Capillary: 105 mg/dL — ABNORMAL HIGH (ref 65–99)
Glucose-Capillary: 120 mg/dL — ABNORMAL HIGH (ref 65–99)
Glucose-Capillary: 118 mg/dL — ABNORMAL HIGH (ref 65–99)

## 2017-04-08 LAB — CBC
HCT: 34.1 % — ABNORMAL LOW (ref 39.0–52.0)
HEMOGLOBIN: 11.9 g/dL — AB (ref 13.0–17.0)
MCH: 35 pg — AB (ref 26.0–34.0)
MCHC: 34.9 g/dL (ref 30.0–36.0)
MCV: 100.3 fL — ABNORMAL HIGH (ref 78.0–100.0)
PLATELETS: 153 10*3/uL (ref 150–400)
RBC: 3.4 MIL/uL — ABNORMAL LOW (ref 4.22–5.81)
RDW: 15.2 % (ref 11.5–15.5)
WBC: 7 10*3/uL (ref 4.0–10.5)

## 2017-04-08 LAB — MAGNESIUM: MAGNESIUM: 1.4 mg/dL — AB (ref 1.7–2.4)

## 2017-04-08 MED ORDER — MAGNESIUM SULFATE 2 GM/50ML IV SOLN
2.0000 g | Freq: Once | INTRAVENOUS | Status: AC
Start: 2017-04-08 — End: 2017-04-08
  Administered 2017-04-08: 2 g via INTRAVENOUS
  Filled 2017-04-08: qty 50

## 2017-04-08 NOTE — Progress Notes (Signed)
Physical Therapy Treatment Patient Details Name: [name] MRN: [id number] DOB: 06-18-1965 Today's Date: 04/08/2017    History of Present Illness 52 yo male admitted with ETOH W/D. Hx of ETOH abuse, obesity, CKD, falls, SAH, DM, AAA.     PT Comments    Pt very groggy, slow and dazed.  Required + 2 assist to gat OOB and amb in hallway.  See mobility details below.   Follow Up Recommendations  Home health PT;Supervision/Assistance - 24 hour     Equipment Recommendations  Rolling walker with 5" wheels    Recommendations for Other Services       Precautions / Restrictions Precautions Precautions: Fall Precaution Comments: AMS, groggy Restrictions Weight Bearing Restrictions: No    Mobility  Bed Mobility Overal bed mobility: Needs Assistance Bed Mobility: Supine to Sit     Supine to sit: Max assist;+2 for physical assistance;+2 for safety/equipment     General bed mobility comments: Groggy and slow  Transfers Overall transfer level: Needs assistance Equipment used: Rolling walker (2 wheeled) Transfers: Sit to/from Stand Sit to Stand: Mod assist;+2 physical assistance;+2 safety/equipment         General transfer comment: 75% VC's for upright postue and assist to power up as pt appears groggy, slow, delayed   Ambulation/Gait Ambulation/Gait assistance: Min assist;+2 physical assistance;+2 safety/equipment Ambulation Distance (Feet): 385 Feet Assistive device: Rolling walker (2 wheeled) Gait Pattern/deviations: Step-through pattern;Wide base of support Gait velocity: decreased   General Gait Details: + 2 side by side assist with 3rd following with recliner.  Very unsteady shuffled short step gait.  Ataxic.     Stairs            Wheelchair Mobility    Modified Rankin (Stroke Patients Only)       Balance                                            Cognition Arousal/Alertness: Awake/alert   Overall Cognitive Status:  Impaired/Different from baseline Area of Impairment: Following commands;Safety/judgement;Awareness;Problem solving                       Following Commands: Follows one step commands with increased time;Follows one step commands inconsistently       General Comments: following simple repeated commands     pt is groggy      Exercises      General Comments        Pertinent Vitals/Pain Pain Assessment: No/denies pain    Home Living                      Prior Function            PT Goals (current goals can now be found in the care plan section) Progress towards PT goals: Progressing toward goals    Frequency    Min 3X/week      PT Plan Current plan remains appropriate    Co-evaluation              AM-PAC PT "6 Clicks" Daily Activity  Outcome Measure  Difficulty turning over in bed (including adjusting bedclothes, sheets and blankets)?: Unable Difficulty moving from lying on back to sitting on the side of the bed? : Unable Difficulty sitting down on and standing up from a chair with arms (e.g., wheelchair, bedside commode, etc,.)?: Unable Help  needed moving to and from a bed to chair (including a wheelchair)?: A Lot Help needed walking in hospital room?: Total Help needed climbing 3-5 steps with a railing? : Total 6 Click Score: 7    End of Session Equipment Utilized During Treatment: Gait belt Activity Tolerance: Other (comment)(impaired cognition) Patient left: in chair;with restraints reapplied;with call bell/phone within reach Nurse Communication: Mobility status PT Visit Diagnosis: Difficulty in walking, not elsewhere classified (R26.2)     Time: 0930-1000 PT Time Calculation (min) (ACUTE ONLY): 30 min  Charges:  $Gait Training: 8-22 mins $Therapeutic Activity: 8-22 mins                    G Codes:       Felecia ShellingLori Jet Armbrust  PTA [facility]  Acute  Rehab Pager      [phone]

## 2017-04-08 NOTE — Progress Notes (Signed)
Patient ID: [name], male   DOB: Jun 13, 1965, 52 y.o.   MRN: [id number]    PROGRESS NOTE  [name]  WUJ:[id number] DOB: Jun 13, 1965 DOA: 04/03/2017  PCP: No primary care provider on file.   Brief Narrative:  Patient is 52 year old male with known chronic kidney disease stage II, hypertension, obesity, hyperlipidemia, alcohol abuse, last documented drink apparently on 03/31/2017, presented with tremors and balance issues. He has been admitted for alcohol withdrawal. Patient has been on CIWA protocol, was given additional Haldol for agitation but continues to remain agitated, getting out of the bed, hallucinating. Has been getting Ativan IV 2 mg every 2-3 hours with intermittent Haldol 2 mg IV. Critical care was consulted overnight for consideration of Precedex infusion.  Assessment & Plan:   Principal Problem:   Alcohol withdrawal (HCC) - has been on CIWA protocol and moved to SDU on 01/16 due to worsening agitation - more alert this am but still confused  - no indication for Precedex drip at this time   Active Problems:   Acute metabolic encephalopathy - from alcohol withdrawal - more alert but still confused  - will continue to monitor     Right tibial wound - full thickness laceration to the right LE with mild surrounding erythema and serous drainage - appreciate WOC team assistance, plan is to provide wound care to this area with saline and moistened gauze dressing changed BID - continue doxycycline     Acute kidney injury - suspected pre renal  - resolved     DM2 (diabetes mellitus, type 2) (HCC) - keep on SSI    Tachycardia - suspect from withdrawal - stable overall - keep on tele     Depression - continue Fluoxetine     Hypertensive - has been on lisinopril and HCTZ - lisinopril was held due to AKI - BP stable so far    Thrombocytopenia - Likely alcohol induced - resolved     Hypokalemia, hypomagnesemia  - supplemented and WNL  - Mg is  low, will supplement  - BMP In AM - Mg in AM      Obesity - Body mass index is 31.04 kg/m.  DVT prophylaxis: Lovenox SQ Code Status: Full code Family Communication: pt at bedside  Disposition Plan: keep in SDU   Consultants:   PCCM  Procedures:   None   Antimicrobials:   Zosyn 01/16 --> 01/17  Doxycycline 01/17 -->  Subjective: Pt more alert this am, still confused.   Objective: Vitals:   04/08/17 0416 04/08/17 0800 04/08/17 1200 04/08/17 1300  BP:   (!) 132/103 (!) 119/59  Pulse:  (!) 110 (!) 102 97  Resp:  (!) 23 18 (!) 24  Temp: 99.6 F (37.6 C) 98.7 F (37.1 C) 98.3 F (36.8 C)   TempSrc: Oral Oral Oral   SpO2:  95% 95% 98%  Weight:      Height:        Intake/Output Summary (Last 24 hours) at 04/08/2017 1354 Last data filed at 04/08/2017 1300 Gross per 24 hour  Intake 1138.75 ml  Output 1775 ml  Net -636.25 ml   Filed Weights   04/03/17 1414 04/03/17 2130 04/07/17 0300  Weight: 106.6 kg (235 lb) 103.8 kg (228 lb 13.4 oz) 97.3 kg (214 lb 8.1 oz)   Physical Exam  Constitutional: Appears calm, confused, NAD CVS: RRR, S1/S2 +, no gallops, no carotid bruit.  Pulmonary: Effort and breath sounds normal, no stridor, rhonchi, wheezes, rales.  Abdominal: Soft. BS +,  no distension, tenderness, rebound or guarding.  Musculoskeletal: Normal range of motion. No edema and no tenderness.   Data Reviewed: I have personally reviewed following labs and imaging studies  CBC: Recent Labs  Lab 04/03/17 1445 04/04/17 0600 04/07/17 0316 04/08/17 0300  WBC 6.8 5.1 6.1 7.0  HGB 11.4* 11.0* 11.3* 11.9*  HCT 32.3* 32.5* 32.8* 34.1*  MCV 97.6 99.7 99.4 100.3*  PLT 122* 104* 140* 153   Basic Metabolic Panel: Recent Labs  Lab 04/03/17 1445 04/04/17 0600 04/06/17 0604 04/07/17 0316 04/08/17 0300  NA 132* 136 132* 132* 132*  K 3.2* 3.6 3.2* 3.4* 4.0  CL 95* 102 97* 100* 102  CO2 25 25 26 24 24   GLUCOSE 219* 111* 100* 104* 121*  BUN 6 6 11 20 14     CREATININE 0.88 0.70 0.72 1.34* 0.90  CALCIUM 8.3* 8.1* 8.8* 8.3* 8.5*  MG  --   --   --   --  1.4*   CBG: Recent Labs  Lab 04/07/17 0750 04/07/17 1144 04/07/17 1620 04/07/17 2100 04/08/17 0743  GLUCAP 93 152* 84 128* 105*   Urine analysis:    Component Value Date/Time   COLORURINE AMBER (A) 04/03/2017 1630   APPEARANCEUR CLEAR 04/03/2017 1630   LABSPEC 1.008 04/03/2017 1630   PHURINE 9.0 (H) 04/03/2017 1630   GLUCOSEU 50 (A) 04/03/2017 1630   HGBUR SMALL (A) 04/03/2017 1630   BILIRUBINUR NEGATIVE 04/03/2017 1630   KETONESUR NEGATIVE 04/03/2017 1630   PROTEINUR 100 (A) 04/03/2017 1630   UROBILINOGEN 0.2 01/15/2015 2117   NITRITE NEGATIVE 04/03/2017 1630   LEUKOCYTESUR NEGATIVE 04/03/2017 1630   Radiology Studies: No results found.   Scheduled Meds: . Chlorhexidine Gluconate Cloth  6 each Topical Q0600  . doxycycline  100 mg Oral Q12H  . enoxaparin (LOVENOX) injection  40 mg Subcutaneous Q24H  . FLUoxetine  20 mg Oral Daily  . folic acid  1 mg Oral Daily  . hydrochlorothiazide  25 mg Oral Daily  . insulin aspart  0-9 Units Subcutaneous TID WC  . levETIRAcetam  500 mg Oral BID  . LORazepam  0-4 mg Oral Q12H  . multivitamin with minerals  1 tablet Oral Daily  . mupirocin ointment  1 application Nasal BID  . thiamine  100 mg Oral Daily   Continuous Infusions:   LOS: 4 days   Time spent: 25 minutes   Debbora Presto, MD [facility] Pager [phone]  If 7PM-7AM, please contact night-coverage [email or web address] Password [email or web address] 04/08/2017, 1:54 PM

## 2017-04-09 LAB — CBC
HCT: 35.3 % — ABNORMAL LOW (ref 39.0–52.0)
Hemoglobin: 12.2 g/dL — ABNORMAL LOW (ref 13.0–17.0)
MCH: 34.9 pg — AB (ref 26.0–34.0)
MCHC: 34.6 g/dL (ref 30.0–36.0)
MCV: 100.9 fL — ABNORMAL HIGH (ref 78.0–100.0)
PLATELETS: 199 10*3/uL (ref 150–400)
RBC: 3.5 MIL/uL — ABNORMAL LOW (ref 4.22–5.81)
RDW: 14.9 % (ref 11.5–15.5)
WBC: 6.6 10*3/uL (ref 4.0–10.5)

## 2017-04-09 LAB — BASIC METABOLIC PANEL
Anion gap: 8 (ref 5–15)
BUN: 14 mg/dL (ref 6–20)
CALCIUM: 8.6 mg/dL — AB (ref 8.9–10.3)
CO2: 22 mmol/L (ref 22–32)
CREATININE: 0.77 mg/dL (ref 0.61–1.24)
Chloride: 99 mmol/L — ABNORMAL LOW (ref 101–111)
Glucose, Bld: 103 mg/dL — ABNORMAL HIGH (ref 65–99)
Potassium: 4 mmol/L (ref 3.5–5.1)
SODIUM: 129 mmol/L — AB (ref 135–145)

## 2017-04-09 LAB — GLUCOSE, CAPILLARY
GLUCOSE-CAPILLARY: 146 mg/dL — AB (ref 65–99)
Glucose-Capillary: 137 mg/dL — ABNORMAL HIGH (ref 65–99)

## 2017-04-09 LAB — MAGNESIUM: MAGNESIUM: 1.6 mg/dL — AB (ref 1.7–2.4)

## 2017-04-09 MED ORDER — DEXMEDETOMIDINE HCL IN NACL 200 MCG/50ML IV SOLN: 0.4000 ug/kg/h | INTRAVENOUS | Status: DC

## 2017-04-09 MED ORDER — MAGNESIUM SULFATE 2 GM/50ML IV SOLN
2.0000 g | Freq: Once | INTRAVENOUS | Status: AC
  Administered 2017-04-09: 2 g via INTRAVENOUS
  Filled 2017-04-09: qty 50

## 2017-04-09 MED ORDER — DEXMEDETOMIDINE HCL IN NACL 200 MCG/50ML IV SOLN
0.4000 ug/kg/h | INTRAVENOUS | Status: DC
  Administered 2017-04-09 (×3): 0.6 ug/kg/h via INTRAVENOUS
  Administered 2017-04-10: 0.5 ug/kg/h via INTRAVENOUS
  Administered 2017-04-10 (×2): 0.6 ug/kg/h via INTRAVENOUS
  Administered 2017-04-10: 0.5 ug/kg/h via INTRAVENOUS
  Filled 2017-04-09: qty 50
  Filled 2017-04-09: qty 100
  Filled 2017-04-09 (×3): qty 50
  Filled 2017-04-09: qty 100
  Filled 2017-04-09 (×3): qty 50

## 2017-04-09 NOTE — Progress Notes (Addendum)
Patient ID: [name], male   DOB: [birth year], 52 y.o.   MRN: [id number]    PROGRESS NOTE  Kalyn Dimattia  WUJ:[id number] DOB: 1965/05/02 DOA: 04/03/2017  PCP: No primary care provider on file.   Brief Narrative:  Patient is 52 year old male with known chronic kidney disease stage II, hypertension, obesity, hyperlipidemia, alcohol abuse, last documented drink apparently on 03/31/2017, presented with tremors and balance issues. He has been admitted for alcohol withdrawal. Patient has been on CIWA protocol, was given additional Haldol for agitation but continues to remain agitated, getting out of the bed, hallucinating. Has been getting Ativan IV 2 mg every 2-3 hours with intermittent Haldol 2 mg IV. Critical care was consulted overnight for consideration of Precedex infusion.  Assessment & Plan:   Principal Problem:   Alcohol withdrawal (HCC) / Acute metabolic encephalopathy  - has been on CIWA protocol and moved to SDU on 01/16 due to worsening agitation - Yesterday 1/19 pt more confused, restless - Started precedex 1/19, appreciate CCM assistance with precedex drip  - Continue to monitor in SDU  Active Problems:   Right tibial wound - full thickness laceration to the right LE with mild surrounding erythema and serous drainage - appreciate WOC team assistance, plan is to provide wound care to this area with saline and moistened gauze dressing changed BID - continue doxycycline     Acute kidney injury - suspected pre renal  - resolved     DM2 (diabetes mellitus, type 2) (HCC) - continue SSI    Tachycardia - suspect from withdrawal - stable     Depression - continue Fluoxetine     Hypertensive, essential  - BP 84/58  - Monitor on tele   Thrombocytopenia - due to bone marrow suppression from alcohol abuse - platelets WNL     Hyponatremia - Suspect from alcohol abuse - Sodium 132    Hypokalemia, hypomagnesemia  - Supplemented and WNL    Obesity - Body  mass index is 31.04 kg/m.  DVT prophylaxis: Lovenox subQ Code Status: Full code Family Communication: on precedex so does not require sitter at this time Disposition Plan: keep in SDU  Consultants:   PCCM  Procedures:   None   Antimicrobials:   Zosyn 01/16 --> 01/17  Doxycycline 01/17 -->  Subjective: More restless yesterday afternoon.  Objective: Vitals:   04/09/17 0731 04/09/17 0800 04/09/17 1000 04/09/17 1140  BP:  (!) 168/100 134/80   Pulse:  88 89   Resp:  17 (!) 21   Temp: 98.4 F (36.9 C)   98.6 F (37 C)  TempSrc: Oral   Oral  SpO2:  96% 93%   Weight:      Height:        Intake/Output Summary (Last 24 hours) at 04/09/2017 1419 Last data filed at 04/09/2017 0900 Gross per 24 hour  Intake 290 ml  Output 480 ml  Net -190 ml   Filed Weights   04/03/17 1414 04/03/17 2130 04/07/17 0300  Weight: 106.6 kg (235 lb) 103.8 kg (228 lb 13.4 oz) 97.3 kg (214 lb 8.1 oz)   Physical Exam  Constitutional: No distress  CVS: RRR, S1/S2 + Pulmonary: Effort and breath sounds normal, no stridor, rhonchi, wheezes, rales.  Abdominal: Soft. BS +,  no distension, tenderness, rebound or guarding.  Musculoskeletal: Normal range of motion. No edema and no tenderness.  Lymphadenopathy: No lymphadenopathy noted, cervical, inguinal. Neuro: cal, no focal deficits  Skin: Skin is warm and dry. No rash noted.  Psychiatric:  calm, not restless or agitated    Data Reviewed: I have personally reviewed following labs and imaging studies  CBC: Recent Labs  Lab 04/03/17 1445 04/04/17 0600 04/07/17 0316 04/08/17 0300 04/09/17 0234  WBC 6.8 5.1 6.1 7.0 6.6  HGB 11.4* 11.0* 11.3* 11.9* 12.2*  HCT 32.3* 32.5* 32.8* 34.1* 35.3*  MCV 97.6 99.7 99.4 100.3* 100.9*  PLT 122* 104* 140* 153 199   Basic Metabolic Panel: Recent Labs  Lab 04/04/17 0600 04/06/17 0604 04/07/17 0316 04/08/17 0300 04/09/17 0234  NA 136 132* 132* 132* 129*  K 3.6 3.2* 3.4* 4.0 4.0  CL 102 97* 100* 102  99*  CO2 25 26 24 24 22   GLUCOSE 111* 100* 104* 121* 103*  BUN 6 11 20 14 14   CREATININE 0.70 0.72 1.34* 0.90 0.77  CALCIUM 8.1* 8.8* 8.3* 8.5* 8.6*  MG  --   --   --  1.4* 1.6*   CBG: Recent Labs  Lab 04/07/17 2100 04/08/17 0743 04/08/17 1703 04/08/17 2109 04/09/17 1138  GLUCAP 128* 105* 120* 118* 146*   Urine analysis:    Component Value Date/Time   COLORURINE AMBER (A) 04/03/2017 1630   APPEARANCEUR CLEAR 04/03/2017 1630   LABSPEC 1.008 04/03/2017 1630   PHURINE 9.0 (H) 04/03/2017 1630   GLUCOSEU 50 (A) 04/03/2017 1630   HGBUR SMALL (A) 04/03/2017 1630   BILIRUBINUR NEGATIVE 04/03/2017 1630   KETONESUR NEGATIVE 04/03/2017 1630   PROTEINUR 100 (A) 04/03/2017 1630   UROBILINOGEN 0.2 01/15/2015 2117   NITRITE NEGATIVE 04/03/2017 1630   LEUKOCYTESUR NEGATIVE 04/03/2017 1630   Radiology Studies: No results found.   Scheduled Meds: . Chlorhexidine Gluconate Cloth  6 each Topical Q0600  . doxycycline  100 mg Oral Q12H  . enoxaparin (LOVENOX) injection  40 mg Subcutaneous Q24H  . FLUoxetine  20 mg Oral Daily  . folic acid  1 mg Oral Daily  . hydrochlorothiazide  25 mg Oral Daily  . insulin aspart  0-9 Units Subcutaneous TID WC  . levETIRAcetam  500 mg Oral BID  . LORazepam  0-4 mg Oral Q12H  . multivitamin with minerals  1 tablet Oral Daily  . mupirocin ointment  1 application Nasal BID  . thiamine  100 mg Oral Daily   Continuous Infusions:   LOS: 5 days   Time spent: 25 minutes   Manson PasseyAlma Aldric Wenzler, MD [facility] Pager [phone]  If 7PM-7AM, please contact night-coverage [email or web address] Password [email or web address] 04/09/2017, 2:19 PM

## 2017-04-09 NOTE — Progress Notes (Signed)
   Call from Dr Elisabeth Pigeonevine of [facility] . I am the Startex stable patient rounder. I am not the acute critical care consult rounder. Aam at remote location. Per Dr Alda Leaeviine despite ativan/haldol it seems current RASs +3  Plan Ok for precedex infusion; remote order given For formal consult - ercommended Dr Elisabeth Pigeonevine page [phone]    .Dr. Kalman ShanMurali Josemaria Brining, M.D., [facility].C.C.P Pulmonary and Critical Care Medicine Staff Physician, [facility] Director - Interstitial Lung Disease  Program  Pulmonary Fibrosis [facility] [place], [place], [id number]  Pager: [phone], If no answer or between  15:00h - 7:00h: call [phone] Telephone: [phone]

## 2017-04-09 NOTE — Consult Note (Signed)
.. ..  Name: [name] MRN: [id number] DOB: [birth year]    ADMISSION DATE:  04/03/2017 CONSULTATION DATE:  04/03/2017  REFERRING MD :  Elisabeth Pigeon MD  CHIEF COMPLAINT:  ETOH WITHDRAWL  BRIEF PATIENT DESCRIPTION: 52 yr old male with PMHx of AAA, HTN, ETOH abuse admitted on 04/03/17 for balance issues PCCM consulted for ETOH withdrawal possible need for Precedex ggt  STUDIES:  No new imaging since 04/03/17 CTH w/o contrast: Small left posterior scalp hematoma without skull fracture or intracranial hemorrhage.  Mild-to-moderate diffuse cerebral and cerebellar atrophy.  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  HISTORY OF PRESENT ILLNESS:  52 yr old male with PMHx AAA, HTN, and ETOH abuse who was admitted on 04/03/17 for tremors and balance issues. His last drink per the patient was 03/30/17-03/31/17.  Since admission he has been on CIWA protocol but despite being medicated with Ativan and Haldol he continued to have an elevated CIWA . Hospitalist consulted PCCM team for initiation of Precedex infusion.  PAST MEDICAL HISTORY :   has a past medical history of Abdominal aneurysm (HCC), Chronic kidney disease, stage III (moderate) (HCC), Hypertension, Obesity (BMI 30-39.9), Other and unspecified hyperlipidemia, and Type II or unspecified type diabetes mellitus without mention of complication, not stated as uncontrolled.  has a past surgical history that includes stomach vessel aneurysm. Prior to Admission medications   Medication Sig Start Date End Date Taking? Authorizing Provider  Canagliflozin (INVOKANA PO) Take 300 mg by mouth daily.    Yes [provider]  colesevelam (WELCHOL) 625 MG tablet Take 625 mg by mouth 3 (three) times daily.   Yes [provider]  FLUoxetine (PROZAC) 20 MG tablet Take 20 mg by mouth daily.   Yes [provider]  folic acid (FOLVITE) 1 MG tablet Take 1 tablet (1 mg total) by mouth daily. 01/16/15  Yes  Alison Murray, MD  gabapentin (NEURONTIN) 300 MG capsule Take 1 capsule (300 mg total) by mouth 3 (three) times daily. 03/24/17  Yes Charm Rings, NP  levETIRAcetam (KEPPRA) 500 MG tablet Take 1 tablet (500 mg total) by mouth 2 (two) times daily. 07/18/16  Yes Wendee Beavers, DO  lisinopril-hydrochlorothiazide (PRINZIDE,ZESTORETIC) 20-25 MG tablet Take 1 tablet by mouth daily.   Yes [provider]  metFORMIN (GLUCOPHAGE) 500 MG tablet Take 1 tablet (500 mg total) by mouth 2 (two) times daily with a meal. 01/16/15  Yes Alison Murray, MD   No Known Allergies  FAMILY HISTORY:  family history includes Diabetes Mellitus II in his mother. SOCIAL HISTORY:  reports that he is a non-smoker but has been exposed to tobacco smoke. he has never used smokeless tobacco. He reports that he drinks about 7.2 oz of alcohol per week. He reports that he does not use drugs.  REVIEW OF SYSTEMS:  No complaints Constitutional: Negative for fever, chills, weight loss, malaise/fatigue and diaphoresis.  HENT: Negative for hearing loss, ear pain, nosebleeds, congestion, sore throat, neck pain, tinnitus and ear discharge.   Eyes: Negative for blurred vision, double vision, photophobia, pain, discharge and redness.  Respiratory: Negative for cough, hemoptysis, sputum production, shortness of breath, wheezing and stridor.   Cardiovascular: Negative for chest pain, palpitations, orthopnea, claudication, leg swelling and PND.  Gastrointestinal: Negative for heartburn, nausea, vomiting, abdominal pain, diarrhea, constipation, blood in stool and melena.  Genitourinary: Negative for dysuria, urgency, frequency, hematuria and flank pain.  Musculoskeletal: Negative for myalgias, back pain, joint pain and falls.  Skin: Negative  for itching and rash.  Neurological: Negative for dizziness, tingling, tremors, sensory change, speech change, focal weakness, seizures, loss of consciousness, weakness and headaches.    Endo/Heme/Allergies: Negative for environmental allergies and polydipsia. Does not bruise/bleed easily.  SUBJECTIVE:   VITAL SIGNS: Temp:  [98 F (36.7 C)-98.9 F (37.2 C)] 98.9 F (37.2 C) (01/19 1515) Pulse Rate:  [68-131] 68 (01/19 2000) Resp:  [16-33] 22 (01/19 2000) BP: (111-196)/(72-110) 113/74 (01/19 2000) SpO2:  [92 %-97 %] 96 % (01/19 2000)  PHYSICAL EXAMINATION: Constitutional:in no acute distress, sleeping but arousable CVS: RRR, S1/S2 + Pulmonary:  no rhonci, good air entry b/l, no wheezing  Abdominal: Soft. BS +,  no distension,+BS  Musculoskeletal: Normal range of motion.a full thickness laceration to the right LE with mild surrounding erythema and serous drainage Neuro: sleeping, arousable on Precedex 0.36mcg/kg/hr Skin: Skin is warm and dry. No rash noted. Psychiatric:  not agitated     Recent Labs  Lab 04/07/17 0316 04/08/17 0300 04/09/17 0234  NA 132* 132* 129*  K 3.4* 4.0 4.0  CL 100* 102 99*  CO2 24 24 22   BUN 20 14 14   CREATININE 1.34* 0.90 0.77  GLUCOSE 104* 121* 103*   Recent Labs  Lab 04/07/17 0316 04/08/17 0300 04/09/17 0234  HGB 11.3* 11.9* 12.2*  HCT 32.8* 34.1* 35.3*  WBC 6.1 7.0 6.6  PLT 140* 153 199   No results found.  ASSESSMENT / PLAN: NEURO: Acute Metabolic Encephalopathy secondary to ETOH withdrawal  On Dexmedetomidine current rate 0.6 mcg/kg/hr RASS goal 0  GCS 9 Continue on Thiamine and Folate Continue with CIWA precautions Neurochecks Seizure and Aspiration precautions H/o seizures was on Keppra- continue AED Also previously on Neurontin 300mg  continue Was started on Prozac 20mg  on 04/09/17- h/o major depressive disorder Also receiving Haldol 2mg  tabs Caution with these medications and QT prolongation- pt's QTc 449  CARDIAC: Hemodynamically stable monitor for hypotension and bradycardia while on Dexmedetomidine   last echo was in 2016 EF 60% to 65% mild biatrial enlargement with no significant valvular  abnormalities normal diastolic and systolic function noted. EKG reviewed QTC noted 449 sinus tachycardia with some premature atrial complexes QRS 84ms Pt was recently started on Hydrodiuril and Apresoline for elevated BP Most likely HTN was secondary to withdrawal  Currently on Precedx ggt would hold antihypertensives at this time.  ID: MRSA positive - on contact isolation No leukocytosis on CBC On Doxycycline 100mg   Started by IM for Right tibial wound- previously on Zosyn Wound care -> with saline and moistened gauze dressing changed BID Check PCT if negative discontinue antibiotics Trend white count and fever curve   Endocrine: H/o insulin resistance On Invokana and Metformin as an outpatient CBGs have ranged from 100-121 mg/dL.   One reading at 219 mg/dL on 1/61/09 Noted Glucosuria on dipstick Continue on Phase 1 ICU Glycemic protocol Check HgbA1c  GI: NPO for now Continue Aspiration precautions Prn Zofran for nausea   Heme: Thrombocytopenia  - h/o ETOH use' No signs of epistaxis or active bleeding If Hgb<7 transfuse PRBCs ..O POS No h/o coagulopathy DVT PPx-> Heparin [place] and SCDs  RENAL Euvolemic Hyponatremia Secondary to ETOH Monitor UOP UDS on 04/03/17 was negative Baseline Cr Lab Results  Component Value Date   CREATININE 0.77 04/09/2017   CREATININE 0.90 04/08/2017   CREATININE 1.34 (H) 04/07/2017  Keep electrolytes : Potassium> 4 Mg >2   I, Dr Newell Coral have personally reviewed patient's available data, including medical history, events of note, physical  examination and test results as part of my evaluation. I have discussed with NP Janyth ContesEubanks and other care providers such as RN and E-Link. The patient is critically ill with multiple organ systems failure and requires high complexity decision making for assessment and support, frequent evaluation and titration of therapies, application of advanced monitoring technologies and extensive interpretation  of multiple databases. Critical Care Time devoted to patient care services described in this note is 45 Minutes. This time reflects time of care of this signee Dr Newell CoralKristen Sri Clegg. This critical care time does not reflect procedure time, or teaching time or supervisory time but could involve care discussion time    DISPOSITION: ICU CC TIME: 45 MINS  PROGNOSIS: GUARDED FAMILY: not at bedside CODE STATUS: Full   Signed Dr Newell CoralKristen Chavon Lucarelli Pulmonary Critical Care Locums   04/09/2017, 8:58 PM

## 2017-04-10 DIAGNOSIS — G9340 Encephalopathy, unspecified: Secondary | ICD-10-CM

## 2017-04-10 LAB — BASIC METABOLIC PANEL
Anion gap: 4 — ABNORMAL LOW (ref 5–15)
BUN: 14 mg/dL (ref 6–20)
CO2: 24 mmol/L (ref 22–32)
Calcium: 8.8 mg/dL — ABNORMAL LOW (ref 8.9–10.3)
Chloride: 104 mmol/L (ref 101–111)
Creatinine, Ser: 0.75 mg/dL (ref 0.61–1.24)
GFR calc Af Amer: >60 mL/min (ref >60)
GFR calc non Af Amer: >60 mL/min (ref >60)
Glucose, Bld: 129 mg/dL — ABNORMAL HIGH (ref 65–99)
Potassium: 4.3 mmol/L (ref 3.5–5.1)
Sodium: 132 mmol/L — ABNORMAL LOW (ref 135–145)

## 2017-04-10 LAB — GLUCOSE, CAPILLARY
GLUCOSE-CAPILLARY: 102 mg/dL — AB (ref 65–99)
Glucose-Capillary: 100 mg/dL — ABNORMAL HIGH (ref 65–99)
Glucose-Capillary: 115 mg/dL — ABNORMAL HIGH (ref 65–99)
Glucose-Capillary: 114 mg/dL — ABNORMAL HIGH (ref 65–99)
Glucose-Capillary: 143 mg/dL — ABNORMAL HIGH (ref 65–99)
Glucose-Capillary: 144 mg/dL — ABNORMAL HIGH (ref 65–99)

## 2017-04-10 LAB — HEMOGLOBIN A1C
Hgb A1c MFr Bld: 5.6 % (ref 4.8–5.6)
Mean Plasma Glucose: 114.02 mg/dL

## 2017-04-10 LAB — CBC
HCT: 35 % — ABNORMAL LOW (ref 39.0–52.0)
Hemoglobin: 12 g/dL — ABNORMAL LOW (ref 13.0–17.0)
MCH: 34.7 pg — ABNORMAL HIGH (ref 26.0–34.0)
MCHC: 34.3 g/dL (ref 30.0–36.0)
MCV: 101.2 fL — ABNORMAL HIGH (ref 78.0–100.0)
Platelets: 193 10*3/uL (ref 150–400)
RBC: 3.46 MIL/uL — ABNORMAL LOW (ref 4.22–5.81)
RDW: 14.9 % (ref 11.5–15.5)
WBC: 5.2 10*3/uL (ref 4.0–10.5)

## 2017-04-10 LAB — MAGNESIUM: Magnesium: 2.1 mg/dL (ref 1.7–2.4)

## 2017-04-10 LAB — PROCALCITONIN: Procalcitonin: 0.32 ng/mL

## 2017-04-10 MED ORDER — LIP MEDEX EX OINT
TOPICAL_OINTMENT | CUTANEOUS | Status: AC
  Administered 2017-04-10: 19:00:00
  Filled 2017-04-10: qty 7

## 2017-04-10 MED ORDER — LIP MEDEX EX OINT
TOPICAL_OINTMENT | CUTANEOUS | Status: AC
  Administered 2017-04-10: 14:00:00
  Filled 2017-04-10: qty 7

## 2017-04-10 NOTE — Assessment & Plan Note (Signed)
Due to DT/etoh withdrawal. Did not respond to benzoe. Now improved with precedex since 04/09/17 PM but unable to come off precedex 04/10/2017  Plan Continue precedex gtt for calm RASS goal 0 to -2 Continue MVT  pccm will follow

## 2017-04-10 NOTE — Progress Notes (Signed)
.. ..  Name: Christopher ReichertManuel Shaffer MRN: [id number] DOB: Apr 28, 1965    ADMISSION DATE:  04/03/2017 CONSULTATION DATE:  04/03/2017  REFERRING MD :  Elisabeth PigeonEVINE MD  CHIEF COMPLAINT:  ETOH WITHDRAWL  BRIEF PATIENT DESCRIPTION: 52 yr old male with PMHx AAA, HTN, and ETOH abuse who was admitted on 04/03/17 for tremors and balance issues. His last drink per the patient was 03/30/17-03/31/17.  Since admission he has been on CIWA protocol but despite being medicated with Ativan and Haldol he continued to have an elevated CIWA . Hospitalist consulted PCCM team for initiation of Precedex infusion.  eVENTS 04/03/2017 - admit 04/09/17 - precedex and pccm consult  SUBJECTIVE:  04/10/17 - doing calm and well and improved orientation on precedex   VITAL SIGNS: Temp:  [97.5 F (36.4 C)-98.9 F (37.2 C)] 97.6 F (36.4 C) (01/20 0800) Pulse Rate:  [60-106] 60 (01/20 0600) Resp:  [16-30] 20 (01/20 0600) BP: (95-196)/(63-95) 102/63 (01/20 0600) SpO2:  [92 %-97 %] 95 % (01/20 0600)  PHYSICAL EXAMINATION:  General Appearance:    Sleeping on precedex. Did not appreciate being woken up  Head:    Normocephalic, without obvious abnormality, atraumatic  Eyes:    PERRL - Rt eye blind, conjunctiva/corneas - clear      Ears:    Normal external ear canals, both ears  Nose:   NG tube - no  Throat:  ETT TUBE - no , OG tube - no  Neck:   Supple,  No enlargement/tenderness/nodules     Lungs:     Clear to auscultation bilaterally,   Chest wall:    No deformity  Heart:    S1 and S2 normal, no murmur, CVP - no.  Pressors - no  Abdomen:     Soft, no masses, no organomegaly  Genitalia:    Not done  Rectal:   not done  Extremities:   Extremities- intact     Skin:   Intact in exposed areas .      Neurologic:   Sedation - precedex bgtt -> RASS - -1 . Moves all 4s - yes. CAM-ICU - mild positive for deliruim . Orientation - partial     PULMONARY No results for input(s): PHART, PCO2ART, PO2ART, HCO3, TCO2, O2SAT in the  last 168 hours.  Invalid input(s): PCO2, PO2  CBC Recent Labs  Lab 04/08/17 0300 04/09/17 0234 04/10/17 0205  HGB 11.9* 12.2* 12.0*  HCT 34.1* 35.3* 35.0*  WBC 7.0 6.6 5.2  PLT 153 199 193    COAGULATION No results for input(s): INR in the last 168 hours.  CARDIAC  No results for input(s): TROPONINI in the last 168 hours. No results for input(s): PROBNP in the last 168 hours.   CHEMISTRY Recent Labs  Lab 04/06/17 0604 04/07/17 0316 04/08/17 0300 04/09/17 0234 04/10/17 0205  NA 132* 132* 132* 129* 132*  K 3.2* 3.4* 4.0 4.0 4.3  CL 97* 100* 102 99* 104  CO2 26 24 24 22 24   GLUCOSE 100* 104* 121* 103* 129*  BUN 11 20 14 14 14   CREATININE 0.72 1.34* 0.90 0.77 0.75  CALCIUM 8.8* 8.3* 8.5* 8.6* 8.8*  MG  --   --  1.4* 1.6* 2.1   Estimated Creatinine Clearance: 129.1 mL/min (by C-G formula based on SCr of 0.75 mg/dL).   LIVER Recent Labs  Lab 04/07/17 0316  AST 118*  ALT 47  ALKPHOS 177*  BILITOT 4.3*  PROT 7.3  ALBUMIN 2.9*     INFECTIOUS Recent Labs  Lab 04/10/17 0205  PROCALCITON 0.32     ENDOCRINE CBG (last 3)  Recent Labs    04/08/17 2109 04/09/17 1138 04/09/17 2147  GLUCAP 118* 146* 137*         IMAGING x48h  - image(s) personally visualized  -   highlighted in bold No results found.     ASSESSMENT / PLAN: Encephalopathy acute Due to DT/etoh withdrawal. Did not respond to benzoe. Now improved with precedex since 04/09/17 PM but unable to come off precedex 04/10/2017  Plan Continue precedex gtt for calm RASS goal 0 to -2 Continue MVT  pccm will follow    The patient is critically ill with multiple organ systems failure and requires high complexity decision making for assessment and support, frequent evaluation and titration of therapies, application of advanced monitoring technologies and extensive interpretation of multiple databases.   Critical Care Time devoted to patient care services described in this note is  30   Minutes. This time reflects time of care of this signee Dr Kalman Shan. This critical care time does not reflect procedure time, or teaching time or supervisory time of PA/NP/Med student/Med Resident etc but could involve care discussion time    Dr. Kalman Shan, M.D., [facility].C.P Pulmonary and Critical Care Medicine Staff Physician [facility] [place] [facility] Pager: [phone], If no answer or between  15:00h - 7:00h: call [phone]  04/10/2017 9:23 AM

## 2017-04-11 LAB — CBC
HCT: 33.5 % — ABNORMAL LOW (ref 39.0–52.0)
Hemoglobin: 11.5 g/dL — ABNORMAL LOW (ref 13.0–17.0)
MCH: 34.6 pg — ABNORMAL HIGH (ref 26.0–34.0)
MCHC: 34.3 g/dL (ref 30.0–36.0)
MCV: 100.9 fL — ABNORMAL HIGH (ref 78.0–100.0)
PLATELETS: 218 10*3/uL (ref 150–400)
RBC: 3.32 MIL/uL — ABNORMAL LOW (ref 4.22–5.81)
RDW: 14.6 % (ref 11.5–15.5)
WBC: 6.8 10*3/uL (ref 4.0–10.5)

## 2017-04-11 LAB — BASIC METABOLIC PANEL
Anion gap: 8 (ref 5–15)
BUN: 20 mg/dL (ref 6–20)
CALCIUM: 8.3 mg/dL — AB (ref 8.9–10.3)
CO2: 20 mmol/L — AB (ref 22–32)
CREATININE: 0.83 mg/dL (ref 0.61–1.24)
Chloride: 101 mmol/L (ref 101–111)
Glucose, Bld: 119 mg/dL — ABNORMAL HIGH (ref 65–99)
Potassium: 4.2 mmol/L (ref 3.5–5.1)
SODIUM: 129 mmol/L — AB (ref 135–145)

## 2017-04-11 LAB — MAGNESIUM: MAGNESIUM: 1.7 mg/dL (ref 1.7–2.4)

## 2017-04-11 LAB — GLUCOSE, CAPILLARY
GLUCOSE-CAPILLARY: 116 mg/dL — AB (ref 65–99)
GLUCOSE-CAPILLARY: 144 mg/dL — AB (ref 65–99)
Glucose-Capillary: 113 mg/dL — ABNORMAL HIGH (ref 65–99)

## 2017-04-11 LAB — PHOSPHORUS: PHOSPHORUS: 3.1 mg/dL (ref 2.5–4.6)

## 2017-04-11 LAB — PROCALCITONIN: PROCALCITONIN: 0.43 ng/mL

## 2017-04-11 NOTE — Progress Notes (Signed)
Date:  April 11, 2017 Chart reviewed for concurrent status and case management needs.  Will continue to follow patient progress. Due to be transferred to med-surg floor today, off iv precedex, csw to follow for rehab and resources. Discharge Planning: following for needs.  None present at this time of review. Expected discharge date: April 14, 2017 Marcelle SmilingRhonda Davis, BSN, [place]le[place]RN3, [place]CCM   [phone]

## 2017-04-11 NOTE — Progress Notes (Signed)
Patient ID: [name], male   DOB: Feb 16, 1966, 52 y.o.   MRN: [id number]    PROGRESS NOTE  [name]  YNW:[id number] DOB: May 09, 1965 DOA: 04/03/2017  PCP: No primary care provider on file.   Brief Narrative:  52 year old male with chronic kidney disease stage II, hypertension, obesity, hyperlipidemia, alcohol abuse, last documented drink apparently on 03/31/2017, presented with tremors and balance issues. He has been admitted for alcohol withdrawal. Patient has required precedex drip during this hospitalization.  Assessment & Plan:   Principal Problem:   Alcohol withdrawal (HCC) / Acute metabolic encephalopathy  - has been on CIWA protocol and moved to SDU on 01/16 due to worsening agitation - Pt more confused and restless 1/19 so precedex drip started  - Stopped precedex 1/21 - Stable, oriented to time, place and person - Transfer to telemetry floor today   Active Problems:   Right tibial wound - full thickness laceration to the right LE with mild surrounding erythema and serous drainage - appreciate WOC team assistance, plan is to provide wound care to this area with saline and moistened gauze dressing changed BID    Acute kidney injury - Prerenal etiology, from alcohol abuse, dehydration - Resolved with IV fluids     DM2 (diabetes mellitus, type 2) (HCC) - Continue SSI - CBG's in past 24 hours: 143, 144, 113    Tachycardia - From withdrawal - Now stable     Depression - Continue Prozac     Hypertensive, essential  - BP 100/55   Thrombocytopenia - due to bone marrow suppression from alcohol abuse - Platelets WNL    Macrocytic anemia - Due to alcohol abuse - Hgb stable     Hyponatremia - Suspect from alcohol abuse - Sodium range 129-132 - Follow up BMP in am    Hypokalemia, hypomagnesemia  - Supplemented     Obesity - Body mass index is 31.04 kg/m - Counseled on diet and nutrition   DVT prophylaxis: Lovenox subQ Code Status: Full  code Family Communication: no family at the bedside this am Disposition Plan: transfer to tele today, discharge tomorrow if sodium stable   Consultants:   PCCM  Procedures:   None   Antimicrobials:   Zosyn 01/16 --> 01/17  Doxycycline 01/17 -->  Subjective: More alert this am.  Objective: Vitals:   04/11/17 0355 04/11/17 0400 04/11/17 0638 04/11/17 0740  BP:   (!) 100/55   Pulse:  64 68   Resp:  17 13   Temp: 97.8 F (36.6 C)   98.2 F (36.8 C)  TempSrc: Axillary   Oral  SpO2:  95% 98%   Weight:      Height:        Intake/Output Summary (Last 24 hours) at 04/11/2017 0901 Last data filed at 04/10/2017 1600 Gross per 24 hour  Intake 952.9 ml  Output -  Net 952.9 ml   Filed Weights   04/03/17 1414 04/03/17 2130 04/07/17 0300  Weight: 106.6 kg (235 lb) 103.8 kg (228 lb 13.4 oz) 97.3 kg (214 lb 8.1 oz)   Physical Exam  Constitutional: Appears well-developed and well-nourished. No distress.  HENT: Normocephalic. External right and left ear normal. Oropharynx is clear and moist.  Eyes: Conjunctivae and EOM are normal. PERRLA, no scleral icterus.  Neck: Normal ROM. Neck supple. No JVD. No tracheal deviation. No thyromegaly.  CVS: RRR, S1/S2 + Pulmonary: Effort and breath sounds normal, no stridor, rhonchi, wheezes, rales.  Abdominal: Soft. BS +,  no distension, tenderness,  rebound or guarding.  Musculoskeletal: Normal range of motion. No edema and no tenderness.  Lymphadenopathy: No lymphadenopathy noted, cervical, inguinal. Neuro: Alert. Normal reflexes, muscle tone coordination. No cranial nerve deficit. Skin: Skin is warm and dry. No rash noted. Not diaphoretic. No erythema. No pallor.  Psychiatric: Normal mood and affect. Behavior, judgment, thought content normal.    Data Reviewed: I have personally reviewed following labs and imaging studies  CBC: Recent Labs  Lab 04/07/17 0316 04/08/17 0300 04/09/17 0234 04/10/17 0205 04/11/17 0326  WBC 6.1 7.0 6.6  5.2 6.8  HGB 11.3* 11.9* 12.2* 12.0* 11.5*  HCT 32.8* 34.1* 35.3* 35.0* 33.5*  MCV 99.4 100.3* 100.9* 101.2* 100.9*  PLT 140* 153 199 193 218   Basic Metabolic Panel: Recent Labs  Lab 04/07/17 0316 04/08/17 0300 04/09/17 0234 04/10/17 0205 04/11/17 0326  NA 132* 132* 129* 132* 129*  K 3.4* 4.0 4.0 4.3 4.2  CL 100* 102 99* 104 101  CO2 24 24 22 24  20*  GLUCOSE 104* 121* 103* 129* 119*  BUN 20 14 14 14 20   CREATININE 1.34* 0.90 0.77 0.75 0.83  CALCIUM 8.3* 8.5* 8.6* 8.8* 8.3*  MG  --  1.4* 1.6* 2.1 1.7  PHOS  --   --   --   --  3.1   CBG: Recent Labs  Lab 04/10/17 0804 04/10/17 1159 04/10/17 1621 04/10/17 2152 04/11/17 0735  GLUCAP 115* 114* 143* 144* 113*   Urine analysis:    Component Value Date/Time   COLORURINE AMBER (A) 04/03/2017 1630   APPEARANCEUR CLEAR 04/03/2017 1630   LABSPEC 1.008 04/03/2017 1630   PHURINE 9.0 (H) 04/03/2017 1630   GLUCOSEU 50 (A) 04/03/2017 1630   HGBUR SMALL (A) 04/03/2017 1630   BILIRUBINUR NEGATIVE 04/03/2017 1630   KETONESUR NEGATIVE 04/03/2017 1630   PROTEINUR 100 (A) 04/03/2017 1630   UROBILINOGEN 0.2 01/15/2015 2117   NITRITE NEGATIVE 04/03/2017 1630   LEUKOCYTESUR NEGATIVE 04/03/2017 1630   Radiology Studies: No results found.   Scheduled Meds: . doxycycline  100 mg Oral Q12H  . enoxaparin (LOVENOX) injection  40 mg Subcutaneous Q24H  . FLUoxetine  20 mg Oral Daily  . folic acid  1 mg Oral Daily  . hydrochlorothiazide  25 mg Oral Daily  . insulin aspart  0-9 Units Subcutaneous TID WC  . levETIRAcetam  500 mg Oral BID  . multivitamin with minerals  1 tablet Oral Daily  . mupirocin ointment  1 application Nasal BID  . thiamine  100 mg Oral Daily   Continuous Infusions: . dexmedetomidine (PRECEDEX) IV infusion Stopped (04/11/17 0310)    LOS: 7 days   Time spent: 25 minutes   Manson PasseyAlma Devine, MD [facility] Pager [phone]  If 7PM-7AM, please contact night-coverage [email or web address] Password  [email or web address] 04/11/2017, 9:01 AM

## 2017-04-11 NOTE — Progress Notes (Signed)
Spoke w/ security and ED staff to see about "lost cell phone" unable to locate any cell  phone assoc w/ this pt.

## 2017-04-11 NOTE — Progress Notes (Signed)
Received to room 1440

## 2017-04-11 NOTE — Progress Notes (Signed)
Patient off precedex.  Calm / on CIWA per [facility].  PCCM will be available as needed.  Pending transfer to telemetry floor per primary SVC.  Please call back if new needs arise.   Canary BrimBrandi Brookes Craine, NP-C [place] [facility] Pgr: [phone] or if no answer [phone] 04/11/2017, 9:13 AM

## 2017-04-12 LAB — BASIC METABOLIC PANEL
ANION GAP: 6 (ref 5–15)
BUN: 13 mg/dL (ref 6–20)
CALCIUM: 8.6 mg/dL — AB (ref 8.9–10.3)
CO2: 22 mmol/L (ref 22–32)
CREATININE: 0.81 mg/dL (ref 0.61–1.24)
Chloride: 102 mmol/L (ref 101–111)
GLUCOSE: 99 mg/dL (ref 65–99)
Potassium: 3.4 mmol/L — ABNORMAL LOW (ref 3.5–5.1)
Sodium: 130 mmol/L — ABNORMAL LOW (ref 135–145)

## 2017-04-12 LAB — PROCALCITONIN: PROCALCITONIN: 0.3 ng/mL

## 2017-04-12 LAB — CBC
HEMATOCRIT: 33.4 % — AB (ref 39.0–52.0)
Hemoglobin: 11.6 g/dL — ABNORMAL LOW (ref 13.0–17.0)
MCH: 34.8 pg — ABNORMAL HIGH (ref 26.0–34.0)
MCHC: 34.7 g/dL (ref 30.0–36.0)
MCV: 100.3 fL — ABNORMAL HIGH (ref 78.0–100.0)
PLATELETS: 262 10*3/uL (ref 150–400)
RBC: 3.33 MIL/uL — ABNORMAL LOW (ref 4.22–5.81)
RDW: 14.3 % (ref 11.5–15.5)
WBC: 7.7 10*3/uL (ref 4.0–10.5)

## 2017-04-12 LAB — GLUCOSE, CAPILLARY
GLUCOSE-CAPILLARY: 91 mg/dL (ref 65–99)
Glucose-Capillary: 159 mg/dL — ABNORMAL HIGH (ref 65–99)

## 2017-04-12 LAB — MAGNESIUM: MAGNESIUM: 1.5 mg/dL — AB (ref 1.7–2.4)

## 2017-04-12 LAB — PHOSPHORUS: PHOSPHORUS: 3.1 mg/dL (ref 2.5–4.6)

## 2017-04-12 MED ORDER — POTASSIUM CHLORIDE CRYS ER 20 MEQ PO TBCR
40.0000 meq | EXTENDED_RELEASE_TABLET | Freq: Once | ORAL | Status: AC
  Administered 2017-04-12: 40 meq via ORAL
  Filled 2017-04-12: qty 2

## 2017-04-12 MED ORDER — MAGNESIUM SULFATE 2 GM/50ML IV SOLN
2.0000 g | Freq: Once | INTRAVENOUS | Status: AC
  Administered 2017-04-12: 2 g via INTRAVENOUS
  Filled 2017-04-12: qty 50

## 2017-04-12 MED ORDER — THIAMINE HCL 100 MG PO TABS: 100.0000 mg | ORAL_TABLET | Freq: Every day | ORAL | 0 refills | Status: DC

## 2017-04-12 MED ORDER — DOXYCYCLINE HYCLATE 100 MG PO TABS: 100.0000 mg | ORAL_TABLET | Freq: Two times a day (BID) | ORAL | 0 refills | Status: DC

## 2017-04-12 NOTE — Discharge Summary (Signed)
Physician Discharge Summary  [name] ZOX:[id number] DOB: December 08, 1965 DOA: 04/03/2017  PCP: No primary care provider on file.  Admit date: 04/03/2017 Discharge date: 04/12/2017  Recommendations for Outpatient Follow-up:  1. Check CBC and BMP in the PCP office 2. Continue folic acid, thiamine and multivitamin supplementation  3. Doxycycline for 7 days on discharge   Discharge Diagnoses:  Principal Problem:   Alcohol withdrawal (HCC) Active Problems:   Encephalopathy acute   DM2 (diabetes mellitus, type 2) (HCC)   Tachycardia    Discharge Condition: stable; supplemented potassium and magnesium before discharge   Diet recommendation: as tolerated   History of present illness:  52 year old male with chronic kidney disease stage II, hypertension, obesity, hyperlipidemia, alcohol abuse, last documented drink apparently on 03/31/2017, presented with tremors and balance issues. He has been admitted for alcohol withdrawal. Patient has required precedex drip during this hospitalization.  Hospital Course:   Assessment & Plan:   Principal Problem:   Alcohol withdrawal (HCC) / Acute metabolic encephalopathy  - has been on CIWA protocol and moved to SDU on 01/16 due to worsening agitation - Pt more confused and restless 1/19 so precedex drip started  - Stopped precedex 1/21 - Stable, oriented to time, place and person   Active Problems:   Right tibial wound - full thickness laceration to the right LE with mild surrounding erythema and serous drainage - appreciate WOC team assistance, plan is to provide wound care to this area with saline and moistened gauze dressing changed BID - Doxycycline for 7 days on discharge     Acute kidney injury - Prerenal etiology, from alcohol abuse, dehydration - Resolved with IV fluids     DM2 (diabetes mellitus, type 2) (HCC) - Stable     Tachycardia - From withdrawal - Stable     Depression - Continue Prozac     Hypertensive,  essential  - Stable    Thrombocytopenia - due to bone marrow suppression from alcohol abuse - Platelets WNL    Macrocytic anemia - Due to alcohol abuse - Hgb stable     Hyponatremia - Suspect from alcohol abuse - Sodium range 129-132 - Follow up BMP in am    Hypokalemia, hypomagnesemia  - Supplemented prior to discharge     Obesity - Body mass index is 31.04 kg/m - Counseled on diet and nutrition   DVT prophylaxis: Lovenox subQ Code Status: Full code Family Communication: no family at bedside    Consultants:   PCCM  Procedures:   None   Antimicrobials:   Zosyn 01/16 --> 01/17  Doxycycline 01/17 -->    Signed:  Manson Passey, MD  [facility] 04/12/2017, 9:45 AM  Pager #: [phone]  Time spent in minutes: more than 30 minutes  Discharge Exam: Vitals:   04/11/17 2020 04/12/17 0521  BP: (!) 147/89 129/71  Pulse: (!) 101 93  Resp: 18 18  Temp: 99 F (37.2 C) 99.1 F (37.3 C)  SpO2: 100% 98%   Vitals:   04/11/17 0740 04/11/17 1725 04/11/17 2020 04/12/17 0521  BP:  (!) 144/89 (!) 147/89 129/71  Pulse:  (!) 101 (!) 101 93  Resp:   18 18  Temp: 98.2 F (36.8 C) 98.6 F (37 C) 99 F (37.2 C) 99.1 F (37.3 C)  TempSrc: Oral Oral Oral Oral  SpO2:  100% 100% 98%  Weight:      Height:        General: Pt is alert, follows commands appropriately, not in acute distress  Cardiovascular: Regular rate and rhythm, S1/S2 +, no murmurs Respiratory: Clear to auscultation bilaterally, no wheezing, no crackles, no rhonchi Abdominal: Soft, non tender, non distended, bowel sounds +, no guarding Extremities: no edema, no cyanosis, pulses palpable bilaterally DP and PT Neuro: Grossly nonfocal  Discharge Instructions  Discharge Instructions    Call MD for:  persistant nausea and vomiting   Complete by:  As directed    Call MD for:  redness, tenderness, or signs of infection (pain, swelling, redness, odor or green/yellow discharge around  incision site)   Complete by:  As directed    Call MD for:  severe uncontrolled pain   Complete by:  As directed    Diet - low sodium heart healthy   Complete by:  As directed    Discharge instructions   Complete by:  As directed    Continue doxycycline for 7 days on discharge   Increase activity slowly   Complete by:  As directed      Allergies as of 04/12/2017   No Known Allergies     Medication List    TAKE these medications   colesevelam 625 MG tablet Commonly known as:  WELCHOL Take 625 mg by mouth 3 (three) times daily.   doxycycline 100 MG tablet Commonly known as:  VIBRA-TABS Take 1 tablet (100 mg total) by mouth every 12 (twelve) hours.   FLUoxetine 20 MG tablet Commonly known as:  PROZAC Take 20 mg by mouth daily.   folic acid 1 MG tablet Commonly known as:  FOLVITE Take 1 tablet (1 mg total) by mouth daily.   gabapentin 300 MG capsule Commonly known as:  NEURONTIN Take 1 capsule (300 mg total) by mouth 3 (three) times daily.   INVOKANA PO Take 300 mg by mouth daily.   levETIRAcetam 500 MG tablet Commonly known as:  KEPPRA Take 1 tablet (500 mg total) by mouth 2 (two) times daily.   lisinopril-hydrochlorothiazide 20-25 MG tablet Commonly known as:  PRINZIDE,ZESTORETIC Take 1 tablet by mouth daily.   metFORMIN 500 MG tablet Commonly known as:  GLUCOPHAGE Take 1 tablet (500 mg total) by mouth 2 (two) times daily with a meal.   thiamine 100 MG tablet Take 1 tablet (100 mg total) by mouth daily. Start taking on:  04/13/2017         The results of significant diagnostics from this hospitalization (including imaging, microbiology, ancillary and laboratory) are listed below for reference.    Significant Diagnostic Studies: Dg Chest 2 View  Result Date: 04/03/2017 CLINICAL DATA:  Shortness of breath today EXAM: CHEST  2 VIEW COMPARISON:  January 16, 2015 FINDINGS: The heart size and mediastinal contours are within normal limits. There is no focal  infiltrate, pulmonary edema, or pleural effusion. The visualized skeletal structures are stable. IMPRESSION: No active cardiopulmonary disease. Electronically Signed   By: Sherian Rein M.D.   On: 04/03/2017 15:55   Ct Head Wo Contrast  Result Date: 04/03/2017 CLINICAL DATA:  Altered mental status.  Imbalance. EXAM: CT HEAD WITHOUT CONTRAST TECHNIQUE: Contiguous axial images were obtained from the base of the skull through the vertex without intravenous contrast. COMPARISON:  02/13/2017. FINDINGS: Brain: Diffusely enlarged ventricles and subarachnoid spaces. No intracranial hemorrhage, mass lesion or CT evidence of acute infarction. Vascular: No hyperdense vessel or unexpected calcification. Skull: Stable right zygomatic fixation hardware. No acute skull fracture seen. 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 Other: Small left posterior scalp hematoma. IMPRESSION: 1. Small left posterior scalp hematoma without skull fracture or intracranial hemorrhage. 2. Mild-to-moderate diffuse cerebral and cerebellar atrophy. 3. 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 Electronically Signed   By: Beckie SaltsSteven  Reid M.D.   On: 04/03/2017 16:08    Microbiology: Recent Results (from the past 240 hour(s))  MRSA PCR Screening     Status: Abnormal   Collection Time: 04/06/17 12:35 PM  Result Value Ref Range Status   MRSA by PCR POSITIVE (A) NEGATIVE Final    Comment:        The GeneXpert MRSA Assay (FDA approved for NASAL specimens only), is one component of a comprehensive MRSA colonization surveillance program. It is not intended to diagnose MRSA infection nor to guide or monitor treatment for MRSA infections. CRITICAL RESULT CALLED TO, READ BACK BY AND VERIFIED WITH: CHRISTIAN SMITH,RN [phone] @ 1637 BY J SCOTTON      Labs: Basic Metabolic Panel: Recent  Labs  Lab 04/08/17 0300 04/09/17 0234 04/10/17 0205 04/11/17 0326 04/12/17 0453  NA 132* 129* 132* 129* 130*  K 4.0 4.0 4.3 4.2 3.4*  CL 102 99* 104 101 102  CO2 24 22 24  20* 22  GLUCOSE 121* 103* 129* 119* 99  BUN 14 14 14 20 13   CREATININE 0.90 0.77 0.75 0.83 0.81  CALCIUM 8.5* 8.6* 8.8* 8.3* 8.6*  MG 1.4* 1.6* 2.1 1.7 1.5*  PHOS  --   --   --  3.1 3.1   Liver Function Tests: Recent Labs  Lab 04/07/17 0316  AST 118*  ALT 47  ALKPHOS 177*  BILITOT 4.3*  PROT 7.3  ALBUMIN 2.9*   No results for input(s): LIPASE, AMYLASE in the last 168 hours. No results for input(s): AMMONIA in the last 168 hours. CBC: Recent Labs  Lab 04/08/17 0300 04/09/17 0234 04/10/17 0205 04/11/17 0326 04/12/17 0453  WBC 7.0 6.6 5.2 6.8 7.7  HGB 11.9* 12.2* 12.0* 11.5* 11.6*  HCT 34.1* 35.3* 35.0* 33.5* 33.4*  MCV 100.3* 100.9* 101.2* 100.9* 100.3*  PLT 153 199 193 218 262   Cardiac Enzymes: No results for input(s): CKTOTAL, CKMB, CKMBINDEX, TROPONINI in the last 168 hours. BNP: BNP (last 3 results) No results for input(s): BNP in the last 8760 hours.  ProBNP (last 3 results) No results for input(s): PROBNP in the last 8760 hours.  CBG: Recent Labs  Lab 04/11/17 0735 04/11/17 1403 04/11/17 1703 04/11/17 2019 04/12/17 0741  GLUCAP 113* 116* 144* 159* 91

## 2017-04-12 NOTE — Care Management Note (Signed)
Case Management Note  Patient Details  Name: Christopher ReichertManuel Shaffer MRN: [id number] Date of Birth: 10-09-1965  Subjective/Objective:                    Action/Plan:   Expected Discharge Date:  04/12/17               Expected Discharge Plan:  Home/Self Care  In-House Referral:     Discharge planning Services  CM Consult  Post Acute Care Choice:    Choice offered to:     DME Arranged:    DME Agency:     HH Arranged:    HH Agency:     Status of Service:  Completed, signed off  If discussed at [employer] Length of Stay Meetings, dates discussed:    Additional CommentsGeni Bers:  Christopher Smola, RN 04/12/2017, 1:04 PM

## 2017-04-12 NOTE — Discharge Instructions (Signed)
Ataxia Ataxia is a condition that results in unsteadiness when walking and standing, poor coordination of body movements, and difficulty maintaining an upright posture. It occurs due to a problem with the part of your brain that controls coordination and stability (cerebellar dysfunction). What are the causes? Ataxia can develop later in life (acquired ataxia) during your 20s to 30s, and even as late as into your 60s or beyond. Acquired ataxia may be caused by:  Changes in your nervous system (neurodegenerative).  Changes throughout your body (systemic disorders).  Excess exposure to: ? Medicines, such as phenytoin and lithium. ? Solvents. ? Abuse of alcohol (alcoholism).  Medical conditions, such as: ? Celiac sprue. ? Hypothyroidism. ? Vitamin E deficiency. ? Structural brain abnormalities, such as tumors. ? Multiple sclerosis. ? Stroke. ? Head injury.  Ataxia may also be present early in life (non-acquired ataxia). There are two main types of non-acquired ataxia:  Cerebellar dysfunction present at birth (congenital).  Family inheritance (genetic heredity). Friedreich ataxia is the most common form of hereditary ataxia.  What are the signs or symptoms? The signs and symptoms of ataxia can vary depending on how severe the condition is that causes it. Signs and symptoms may include:  Unsteadiness.  Walking with a wide stance.  Tremor.  Poorly coordinated body movements.  Difficulty maintaining a straight (upright) posture.  Fatigue.  Changes in your speech.  Changes in your vision.  Difficulty swallowing.  Difficulty with writing.  Decreased mental status (dementia).  Muscle spasms.  How is this diagnosed? Ataxia is diagnosed by discussing your personal and family history and through a physical exam. You may also have additional tests such as:  MRI.  Genetic testing.  How is this treated? Treatment for ataxia may include treating or removing the  underlying condition causing the ataxia. Surgery may be required if a structural abnormality in your brain is causing the ataxia. Otherwise, supportive treatments may be used to manage your symptoms. Follow these instructions at home: Monitor your ataxia for any changes. The following actions may help any discomfort you are experiencing:  Do not drink alcohol.  Lie down right away if you become very unsteady, dizzy, nauseated, or feel like you are going to faint. Wait until all of these feelings pass before you get up again.  Get help right away if:  Your unsteadiness suddenly worsens.  You develop severe headaches, chest pain, or abdominal pain.  You have weakness or numbness on one side of your body.  You have problems with your vision.  You feel confused.  You have difficulty speaking.  You have an irregular heartbeat or a very fast pulse. This information is not intended to replace advice given to you by your health care provider. Make sure you discuss any questions you have with your health care provider. Document Released: 10/03/2013 Document Revised: 08/14/2015 Document Reviewed: 06/08/2013 Elsevier Interactive Patient Education  2018 [employer] Fall Prevention in the Home Falls can cause injuries. They can happen to people of all ages. There are many things you can do to make your home safe and to help prevent falls. What can I do on the outside of my home?  Regularly fix the edges of walkways and driveways and fix any cracks.  Remove anything that might make you trip as you walk through a door, such as a raised step or threshold.  Trim any bushes or trees on the path to your home.  Use bright outdoor lighting.  Clear any walking paths of   anything that might make someone trip, such as rocks or tools.  Regularly check to see if handrails are loose or broken. Make sure that both sides of any steps have handrails.  Any raised decks and porches should have guardrails  on the edges.  Have any leaves, snow, or ice cleared regularly.  Use sand or salt on walking paths during winter.  Clean up any spills in your garage right away. This includes oil or grease spills. What can I do in the bathroom?  Use night lights.  Install grab bars by the toilet and in the tub and shower. Do not use towel bars as grab bars.  Use non-skid mats or decals in the tub or shower.  If you need to sit down in the shower, use a plastic, non-slip stool.  Keep the floor dry. Clean up any water that spills on the floor as soon as it happens.  Remove soap buildup in the tub or shower regularly.  Attach bath mats securely with double-sided non-slip rug tape.  Do not have throw rugs and other things on the floor that can make you trip. What can I do in the bedroom?  Use night lights.  Make sure that you have a light by your bed that is easy to reach.  Do not use any sheets or blankets that are too big for your bed. They should not hang down onto the floor.  Have a firm chair that has side arms. You can use this for support while you get dressed.  Do not have throw rugs and other things on the floor that can make you trip. What can I do in the kitchen?  Clean up any spills right away.  Avoid walking on wet floors.  Keep items that you use a lot in easy-to-reach places.  If you need to reach something above you, use a strong step stool that has a grab bar.  Keep electrical cords out of the way.  Do not use floor polish or wax that makes floors slippery. If you must use wax, use non-skid floor wax.  Do not have throw rugs and other things on the floor that can make you trip. What can I do with my stairs?  Do not leave any items on the stairs.  Make sure that there are handrails on both sides of the stairs and use them. Fix handrails that are broken or loose. Make sure that handrails are as long as the stairways.  Check any carpeting to make sure that it is  firmly attached to the stairs. Fix any carpet that is loose or worn.  Avoid having throw rugs at the top or bottom of the stairs. If you do have throw rugs, attach them to the floor with carpet tape.  Make sure that you have a light switch at the top of the stairs and the bottom of the stairs. If you do not have them, ask someone to add them for you. What else can I do to help prevent falls?  Wear shoes that: ? Do not have high heels. ? Have rubber bottoms. ? Are comfortable and fit you well. ? Are closed at the toe. Do not wear sandals.  If you use a stepladder: ? Make sure that it is fully opened. Do not climb a closed stepladder. ? Make sure that both sides of the stepladder are locked into place. ? Ask someone to hold it for you, if possible.  Clearly mark and make sure that   you can see: ? Any grab bars or handrails. ? First and last steps. ? Where the edge of each step is.  Use tools that help you move around (mobility aids) if they are needed. These include: ? Canes. ? Walkers. ? Scooters. ? Crutches.  Turn on the lights when you go into a dark area. Replace any light bulbs as soon as they burn out.  Set up your furniture so you have a clear path. Avoid moving your furniture around.  If any of your floors are uneven, fix them.  If there are any pets around you, be aware of where they are.  Review your medicines with your doctor. Some medicines can make you feel dizzy. This can increase your chance of falling. Ask your doctor what other things that you can do to help prevent falls. This information is not intended to replace advice given to you by your health care provider. Make sure you discuss any questions you have with your health care provider. Document Released: 01/02/2009 Document Revised: 08/14/2015 Document Reviewed: 04/12/2014 Elsevier Interactive Patient Education  2018 [employer]  

## 2017-04-21 ENCOUNTER — Encounter (HOSPITAL_COMMUNITY): Payer: Self-pay | Admitting: Emergency Medicine

## 2017-04-21 ENCOUNTER — Emergency Department (HOSPITAL_COMMUNITY): Payer: BLUE CROSS/BLUE SHIELD

## 2017-04-21 ENCOUNTER — Emergency Department (HOSPITAL_COMMUNITY)
Admission: EM | Admit: 2017-04-21 | Discharge: 2017-04-22 | Disposition: A | Discharge status: Home / Self Care | Payer: BLUE CROSS/BLUE SHIELD | Attending: Emergency Medicine | Admitting: Emergency Medicine

## 2017-04-21 DIAGNOSIS — E1122 Type 2 diabetes mellitus with diabetic chronic kidney disease: Secondary | ICD-10-CM | POA: Insufficient documentation

## 2017-04-21 DIAGNOSIS — R4182 Altered mental status, unspecified: Secondary | ICD-10-CM | POA: Diagnosis present

## 2017-04-21 DIAGNOSIS — I129 Hypertensive chronic kidney disease with stage 1 through stage 4 chronic kidney disease, or unspecified chronic kidney disease: Secondary | ICD-10-CM | POA: Insufficient documentation

## 2017-04-21 DIAGNOSIS — Y908 Blood alcohol level of 240 mg/100 ml or more: Secondary | ICD-10-CM | POA: Diagnosis not present

## 2017-04-21 DIAGNOSIS — F1014 Alcohol abuse with alcohol-induced mood disorder: Secondary | ICD-10-CM | POA: Diagnosis not present

## 2017-04-21 DIAGNOSIS — Z79899 Other long term (current) drug therapy: Secondary | ICD-10-CM | POA: Diagnosis not present

## 2017-04-21 DIAGNOSIS — F10920 Alcohol use, unspecified with intoxication, uncomplicated: Secondary | ICD-10-CM

## 2017-04-21 DIAGNOSIS — N183 Chronic kidney disease, stage 3 (moderate): Secondary | ICD-10-CM | POA: Diagnosis not present

## 2017-04-21 LAB — COMPREHENSIVE METABOLIC PANEL
ALK PHOS: 227 U/L — AB (ref 38–126)
ALT: 55 U/L (ref 17–63)
AST: 97 U/L — ABNORMAL HIGH (ref 15–41)
Albumin: 2.9 g/dL — ABNORMAL LOW (ref 3.5–5.0)
Anion gap: 11 (ref 5–15)
BUN: 9 mg/dL (ref 6–20)
CALCIUM: 8.6 mg/dL — AB (ref 8.9–10.3)
CO2: 23 mmol/L (ref 22–32)
CREATININE: 0.93 mg/dL (ref 0.61–1.24)
Chloride: 103 mmol/L (ref 101–111)
Glucose, Bld: 143 mg/dL — ABNORMAL HIGH (ref 65–99)
Potassium: 3.5 mmol/L (ref 3.5–5.1)
Sodium: 137 mmol/L (ref 135–145)
TOTAL PROTEIN: 7.5 g/dL (ref 6.5–8.1)
Total Bilirubin: 1.2 mg/dL (ref 0.3–1.2)

## 2017-04-21 LAB — CBC
HCT: 33.2 % — ABNORMAL LOW (ref 39.0–52.0)
Hemoglobin: 11.3 g/dL — ABNORMAL LOW (ref 13.0–17.0)
MCH: 34.2 pg — ABNORMAL HIGH (ref 26.0–34.0)
MCHC: 34 g/dL (ref 30.0–36.0)
MCV: 100.6 fL — AB (ref 78.0–100.0)
PLATELETS: 217 10*3/uL (ref 150–400)
RBC: 3.3 MIL/uL — ABNORMAL LOW (ref 4.22–5.81)
RDW: 14.1 % (ref 11.5–15.5)
WBC: 9.2 10*3/uL (ref 4.0–10.5)

## 2017-04-21 LAB — RAPID URINE DRUG SCREEN, HOSP PERFORMED
Amphetamines: NOT DETECTED
Barbiturates: NOT DETECTED
Benzodiazepines: NOT DETECTED
COCAINE: NOT DETECTED
Opiates: NOT DETECTED
Tetrahydrocannabinol: NOT DETECTED

## 2017-04-21 LAB — ETHANOL: ALCOHOL ETHYL (B): 392 mg/dL — AB (ref <10)

## 2017-04-21 MED ORDER — LORAZEPAM 2 MG/ML IJ SOLN: 0.0000 mg | Freq: Four times a day (QID) | INTRAMUSCULAR | Status: DC

## 2017-04-21 MED ORDER — LORAZEPAM 1 MG PO TABS: 0.0000 mg | ORAL_TABLET | Freq: Two times a day (BID) | ORAL | Status: DC

## 2017-04-21 MED ORDER — LORAZEPAM 1 MG PO TABS
0.0000 mg | ORAL_TABLET | Freq: Four times a day (QID) | ORAL | Status: DC
  Administered 2017-04-22: 2 mg via ORAL
  Filled 2017-04-21: qty 2

## 2017-04-21 MED ORDER — THIAMINE HCL 100 MG/ML IJ SOLN: 100.0000 mg | Freq: Every day | INTRAMUSCULAR | Status: DC

## 2017-04-21 MED ORDER — LORAZEPAM 2 MG/ML IJ SOLN: 0.0000 mg | Freq: Two times a day (BID) | INTRAMUSCULAR | Status: DC

## 2017-04-21 MED ORDER — VITAMIN B-1 100 MG PO TABS
100.0000 mg | ORAL_TABLET | Freq: Every day | ORAL | Status: DC
  Administered 2017-04-21 – 2017-04-22 (×2): 100 mg via ORAL
  Filled 2017-04-21 (×2): qty 1

## 2017-04-21 MED ORDER — SODIUM CHLORIDE 0.9 % IV SOLN
Freq: Once | INTRAVENOUS | Status: AC
Start: 2017-04-21 — End: 2017-04-21
  Administered 2017-04-21: 1000 mL via INTRAVENOUS

## 2017-04-21 NOTE — ED Notes (Signed)
Patient appears confused, not able to follow commands.  May be intoxicated, but no smell of ETOH.

## 2017-04-21 NOTE — ED Notes (Signed)
Abigail, PA-C informed of pt's constant yelling out, cursing staff, demanding attention.

## 2017-04-21 NOTE — ED Notes (Signed)
Pt continues yelling out.  When staff enters pt states "can you take my socks off, can you turn the heater on, can you help me with my back?"  Questioned pt if he was still working?  Pt stated "I haven't worked in 3 weeks because my sodium is low."

## 2017-04-21 NOTE — ED Provider Notes (Cosign Needed)
[facility]-EMERGENCY DEPT Provider Note   CSN: [id number] Arrival date & time: 04/21/17  1749     History   Chief Complaint Chief Complaint  Patient presents with  . Suicidal    HPI [name] is a 52 y.o. male.  Brought in by EMS for altered mental status.  Bystanders called EMS because the patient was stumbling around and falling to the ground.  He has a past medical history of chronic alcohol abuse and has been seen in this ER for that complaint many times.  The patient also has a history of type 2 diabetes, chronic kidney disease, history of alcohol withdrawals, traumatic subarachnoid hemorrhage, acute encephalopathy and frontal lobe contusion.  Patient is currently unable to provide history and there is a level 5 caveat.  He has repetitive speech but does state that he has been thinking about killing himself.  He denies any alcohol abuse today although he does appear intoxicated and speech and action.  Patient has multiple contusions and abrasions multiple over his whole body in various states of healing.  He appears to have new abrasions over the left cheek. HPI  Past Medical History:  Diagnosis Date  . 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 Chronic kidney disease, stage III (moderate) (HCC)    Deterding  . Hypertension   . Obesity (BMI 30-39.9)   . Other and unspecified hyperlipidemia   . Type II or unspecified type diabetes mellitus without mention of complication, not stated as uncontrolled     Patient Active Problem List   Diagnosis Date Noted  . Tachycardia   . Alcohol withdrawal (HCC) 04/03/2017  . DM2 (diabetes mellitus, type 2) (HCC) 04/03/2017  . Alcohol abuse with alcohol-induced mood disorder (HCC) 03/24/2017  . Frontal lobe contusion (HCC) 01/23/2017  . Encephalopathy acute   . Fall   . Subarachnoid hemorrhage following injury with brief loss of consciousness but without open intracranial wound (HCC) 07/17/2016  .  Traumatic subarachnoid hemorrhage (HCC) 01/15/2015  . Alcohol intoxication (HCC) 01/15/2015  . SAH (subarachnoid hemorrhage) (HCC) 01/15/2015    Past Surgical History:  Procedure Laterality Date  . stomach vessel aneurysm     Patient reported        Home Medications    Prior to Admission medications   Medication Sig Start Date End Date Taking? Authorizing Provider  Canagliflozin (INVOKANA PO) Take 300 mg by mouth daily.     [provider]  colesevelam (WELCHOL) 625 MG tablet Take 625 mg by mouth 3 (three) times daily.    [provider]  doxycycline (VIBRA-TABS) 100 MG tablet Take 1 tablet (100 mg total) by mouth every 12 (twelve) hours. 04/12/17   Alison Murray, MD  FLUoxetine (PROZAC) 20 MG tablet Take 20 mg by mouth daily.    [provider]  folic acid (FOLVITE) 1 MG tablet Take 1 tablet (1 mg total) by mouth daily. 01/16/15   Alison Murray, MD  gabapentin (NEURONTIN) 300 MG capsule Take 1 capsule (300 mg total) by mouth 3 (three) times daily. 03/24/17   Charm Rings, NP  levETIRAcetam (KEPPRA) 500 MG tablet Take 1 tablet (500 mg total) by mouth 2 (two) times daily. 07/18/16   Wendee Beavers, DO  lisinopril-hydrochlorothiazide (PRINZIDE,ZESTORETIC) 20-25 MG tablet Take 1 tablet by mouth daily.    [provider]  metFORMIN (GLUCOPHAGE) 500 MG tablet Take 1 tablet (500 mg total) by mouth 2 (two) times daily with a meal.  01/16/15   Alison Murrayevine, Alma M, MD  thiamine 100 MG tablet Take 1 tablet (100 mg total) by mouth daily. 04/13/17   Alison Murrayevine, Alma M, MD    Family History Family History  Problem Relation Age of Onset  . Diabetes Mellitus II Mother     Social History Social History   Tobacco Use  . Smoking status: Passive Smoke Exposure - Never Smoker  . Smokeless tobacco: Never Used  Substance Use Topics  . Alcohol use: Yes    Alcohol/week: 7.2 oz    Types: 12 Cans of beer per week  . Drug use: No     Allergies   Patient has no  known allergies.   Review of Systems Review of Systems  Unable to review systems due to mental status. Physical Exam Updated Vital Signs SpO2 97%   Physical Exam  Constitutional: He appears well-nourished. No distress.  HENT:  Head: Normocephalic.  Abrasion over the left zygoma  Eyes: EOM are normal. Pupils are equal, round, and reactive to light. No scleral icterus.  Neck: Normal range of motion. Neck supple.  Cardiovascular: Normal rate and regular rhythm.  Pulmonary/Chest: Effort normal and breath sounds normal.  Cough  Abdominal: Soft. Bowel sounds are normal. He exhibits distension. There is no tenderness.  Large midline abdominal scar.  Appears surgical and well-healed.  Protuberant abdomen  Neurological: He is alert.  Appears clinically intoxicated.  Skin: Skin is warm and dry. He is not diaphoretic.  Multiple areas of abrasion and contusions over the legs, arms.  Nursing note and vitals reviewed.    ED Treatments / Results  Labs (all labs ordered are listed, but only abnormal results are displayed) Labs Reviewed  COMPREHENSIVE METABOLIC PANEL  ETHANOL  CBC  RAPID URINE DRUG SCREEN, HOSP PERFORMED    EKG  EKG Interpretation None       Radiology No results found.  Procedures Procedures (including critical care time)  Medications Ordered in ED Medications - No data to display   Initial Impression / Assessment and Plan / ED Course  I have reviewed the triage vital signs and the nursing notes.  Pertinent labs & imaging results that were available during my care of the patient were reviewed by me and considered in my medical decision making (see chart for details).  Clinical Course as of Apr 22 26  Thu Apr 21, 2017  2351 Alcohol, Ethyl (B): (!!) 392 [AH]    Clinical Course User Index [AH] Arthor CaptainHarris, Taishawn Smaldone, PA-C    Patient with acute alcohol intoxication.  He has been agitated and we have been unable to obtain a head CT or imaging at this time.   Patient awaiting TTS consult when he is clinically sober.  He will also need imaging.  I discussed the case with Dr. Read DriversMolpus who will assume care of the patient overnight.  Final Clinical Impressions(s) / ED Diagnoses   Final diagnoses:  Alcoholic intoxication without complication [facility](HCC)    ED Discharge Orders    None       Arthor CaptainHarris, Delan Ksiazek, PA-C 04/22/17 [id number]

## 2017-04-21 NOTE — ED Triage Notes (Signed)
Per EMS-states bystanders called EMS for patient stumbling and falling to ground, no LOC, small abrasion to left cheek-patient is intoxicated-unsteady on feet-when he arrived to ED he expressed SI-patient is slurring words, needs redirection at times

## 2017-04-21 NOTE — ED Notes (Signed)
Abigail, PA-C informed pt removed IV.

## 2017-04-22 ENCOUNTER — Emergency Department (HOSPITAL_COMMUNITY): Payer: BLUE CROSS/BLUE SHIELD

## 2017-04-22 MED ORDER — ONDANSETRON HCL 4 MG PO TABS: 4.0000 mg | ORAL_TABLET | Freq: Three times a day (TID) | ORAL | Status: DC | PRN

## 2017-04-22 MED ORDER — IBUPROFEN 200 MG PO TABS: 600.0000 mg | ORAL_TABLET | Freq: Three times a day (TID) | ORAL | Status: DC | PRN

## 2017-04-22 MED ORDER — ALUM & MAG HYDROXIDE-SIMETH 200-200-20 MG/5ML PO SUSP: 30.0000 mL | Freq: Four times a day (QID) | ORAL | Status: DC | PRN

## 2017-04-22 MED ORDER — GABAPENTIN 300 MG PO CAPS: 300.0000 mg | ORAL_CAPSULE | Freq: Three times a day (TID) | ORAL | 0 refills | Status: DC

## 2017-04-22 NOTE — BH Assessment (Addendum)
Assessment Note  [name] is an 52 y.o. male, who presents voluntary and unaccompanied to [facility]. Clinician asked the pt, "what brought you to the hospital?" Pt replied, "I needed to get out of the house, I went the bus stop or depot. I don't know how I ended up here." Pt reported, he was at home, he was cold, he left for the bus depot. Pt reported, he has a couple electric heaters but his gas is not on. Pt reported, he drank alcohol last night and this morning to warm up. Pt reported, passive suicidal thoughts for a week. Pt denies a plan. Pt reported, he has been out of work for three weeks. Pt reported, his funds have depleted and he is in the process of trying to borrow on his 99k. During the assessment the repeated this reason for coming to [facility]. Pt denies, HI, AVH, self-injurious behaviors and access to weapons.   Pt denies abuse. Pt reported drinking Whiskey. Pt's BAL was 392 at 1820. Pt denies, being linked to OPT resources (medication management and/or counseling.) Pt denies, previous inpatient admissions.    Pt presents alert in scrubs with logical/coherent speech. Pt's eye contact was fair. Pt's mood was helpless. Pt's affect was flat. Pt's thought process was coherent/relevant. Pt's judgement was partial. Pt was oriented x4. Pt's concentration was normal. Pt's insight was fair. Pt's impulse control was poor. Pt reported, if discharged from [facility] he could contract for safety. Pt reported, if inpatient treatment is recommended he would sign-in voluntarily.   Diagnosis: F10.20 Alcohol use, Severe.  Past Medical History:  Past Medical History:  Diagnosis Date  . Abdominal aneurysm [facility])    Patient reported   . Chronic kidney disease, stage III (moderate) (HCC)    Deterding  . Hypertension   . Obesity (BMI 30-39.9)   . Other and unspecified hyperlipidemia   . Type II or unspecified type diabetes mellitus without mention of complication, not stated as uncontrolled     Past  Surgical History:  Procedure Laterality Date  . stomach vessel aneurysm     Patient reported     Family History:  Family History  Problem Relation Age of Onset  . Diabetes Mellitus II Mother     Social History:  reports that he is a non-smoker but has been exposed to tobacco smoke. he has never used smokeless tobacco. He reports that he drinks about 7.2 oz of alcohol per week. He reports that he does not use drugs.  Additional Social History:  Alcohol / Drug Use Pain Medications: See MAR Prescriptions: See MAR Over the Counter: See MAR History of alcohol / drug use?: Yes Substance #1 Name of Substance 1: Alcohol. 1 - Age of First Use: UTA 1 - Amount (size/oz): Pt reported, drinking Whiskey last night and this morning to stay warm.  1 - Frequency: UTA 1 - Duration: UTA 1 - Last Use / Amount: 04/21/2017.  CIWA: CIWA-Ar BP: 113/76 Pulse Rate: 87 Nausea and Vomiting: no nausea and no vomiting Tactile Disturbances: very mild itching, pins and needles, burning or numbness Tremor: two Auditory Disturbances: very mild harshness or ability to frighten Paroxysmal Sweats: barely perceptible sweating, palms moist Visual Disturbances: mild sensitivity Anxiety: two Headache, Fullness in Head: none present Agitation: two Orientation and Clouding of Sensorium: oriented and can do serial additions CIWA-Ar Total: 11 COWS:    Allergies: No Known Allergies  Home Medications:  (Not in a hospital admission)  OB/GYN Status:  No LMP for male patient.  General Assessment Data Location of Assessment: [facility] TTS Assessment: In system Is this a Tele or Face-to-Face Assessment?: Face-to-Face Is this an Initial Assessment or a Re-assessment for this encounter?: Initial Assessment Marital status: Separated Living Arrangements: Alone Can pt return to current living arrangement?: Yes Admission Status: Voluntary Is patient capable of signing voluntary admission?: Yes Referral Source:  Self/Family/Friend Insurance type: BCBS     Crisis Care Plan Living Arrangements: Alone Legal Guardian: Other:(Self. ) Name of Psychiatrist: NA Name of Therapist: NA  Education Status Is patient currently in school?: No Current Grade: NA Highest grade of school patient has completed: [employer].  Name of school: NA Contact person: NA  Risk to self with the past 6 months Suicidal Ideation: Yes-Currently Present Has patient been a risk to self within the past 6 months prior to admission? : No Suicidal Intent: No Has patient had any suicidal intent within the past 6 months prior to admission? : No Is patient at risk for suicide?: Yes Suicidal Plan?: No(Pt denies. ) Has patient had any suicidal plan within the past 6 months prior to admission? : No Access to Means: No(Pt denies. ) What has been your use of drugs/alcohol within the last 12 months?: Alcohol.  Previous Attempts/Gestures: No How many times?: 0 Other Self Harm Risks: Pt denies.  Triggers for Past Attempts: None known Intentional Self Injurious Behavior: None(Pt denies. ) Family Suicide History: No Recent stressful life event(s): Other (Comment), Loss (Comment)(Seperated from his wife and kids, death of his kids. ) Persecutory voices/beliefs?: No Depression: Yes Depression Symptoms: Feeling angry/irritable, Loss of interest in usual pleasures, Fatigue, Guilt, Feeling worthless/self pity, Isolating, Tearfulness Substance abuse history and/or treatment for substance abuse?: Yes Suicide prevention information given to non-admitted patients: Not applicable  Risk to Others within the past 6 months Homicidal Ideation: No(Pt denies. ) Does patient have any lifetime risk of violence toward others beyond the six months prior to admission? : No Thoughts of Harm to Others: No Current Homicidal Intent: No Current Homicidal Plan: No Access to Homicidal Means: No Identified Victim: NA History of harm to others?:  No Assessment of Violence: None Noted Violent Behavior Description: NA Does patient have access to weapons?: No(Pt denies. ) Criminal Charges Pending?: No Does patient have a court date: No Is patient on probation?: No  Psychosis Hallucinations: None noted(Pt denies. ) Delusions: None noted  Mental Status Report Appearance/Hygiene: In scrubs Eye Contact: Fair Motor Activity: Unremarkable Speech: Logical/coherent Level of Consciousness: Alert Anxiety Level: Minimal Thought Processes: Coherent, Relevant Judgement: Partial Orientation: Place, Time, Situation, Person Obsessive Compulsive Thoughts/Behaviors: None  Cognitive Functioning Concentration: Normal Memory: Recent Intact IQ: Average Insight: Fair Impulse Control: Poor Appetite: (UTA) Sleep: Unable to Assess Vegetative Symptoms: Staying in bed  ADLScreening [facility] Assessment Services) Patient's cognitive ability adequate to safely complete daily activities?: Yes Patient able to express need for assistance with ADLs?: Yes Independently performs ADLs?: Yes (appropriate for developmental age)(Pt reports.)  Prior Inpatient Therapy Prior Inpatient Therapy: No Prior Therapy Dates: NA Prior Therapy Facilty/Provider(s): NA Reason for Treatment: NA  Prior Outpatient Therapy Prior Outpatient Therapy: No Prior Therapy Dates: NA Prior Therapy Facilty/Provider(s): NA Reason for Treatment: NA Does patient have an ACCT team?: No Does patient have Intensive In-House Services?  : No Does patient have [facility] services? : No Does patient have P4CC services?: No  ADL Screening (condition at time of admission) Patient's cognitive ability adequate to safely complete daily activities?: Yes Is the patient deaf or have difficulty hearing?:  No Does the patient have difficulty seeing, even when wearing glasses/contacts?: No Does the patient have difficulty concentrating, remembering, or making decisions?: No Patient able to express  need for assistance with ADLs?: Yes Does the patient have difficulty dressing or bathing?: No Independently performs ADLs?: Yes (appropriate for developmental age)(Pt reports.) Communication: Independent Dressing (OT): Independent Grooming: Independent Feeding: Independent Bathing: Independent Toileting: Independent In/Out Bed: Independent Walks in Home: Independent Does the patient have difficulty walking or climbing stairs?: No(Pt denies. ) Weakness of Legs: None(Pt denies. ) Weakness of Arms/Hands: Left(Pt reported, his left shoulder. )  Home Assistive Devices/Equipment Home Assistive Devices/Equipment: None    Abuse/Neglect Assessment (Assessment to be complete while patient is alone) Abuse/Neglect Assessment Can Be Completed: Yes Physical Abuse: Denies(Pt denies. ) Verbal Abuse: Denies(Pt denies. ) Sexual Abuse: Denies(Pt denies. ) Exploitation of patient/patient's resources: Denies(Pt denies. ) Self-Neglect: Denies(Pt denies. )     Advance Directives (For Healthcare) Does Patient Have a Medical Advance Directive?: No Would patient like information on creating a medical advance directive?: No - Patient declined    Additional Information 1:1 In Past 12 Months?: No CIRT Risk: No Elopement Risk: No Does patient have medical clearance?: Yes     Disposition: Nira ConnJason Berry, NP recommends overnight observation for safety and stabilization. Disposition discussed with Dr. Read DriversMolpus and Joanie CoddingtonLatricia, RN.   Disposition Initial Assessment Completed for this Encounter: Yes Disposition of Patient: Re-evaluation by Psychiatry recommended  On Site Evaluation by:  Holly Bodilyreylese D. Taylan Mayhan, MS, LPC, CRC. Reviewed with Physician:  Dr. Read DriversMolpus and Nira ConnJason Berry, NP.  Redmond Pullingreylese D Michol Emory 04/22/2017 5:08 AM   Redmond Pullingreylese D Jamea Robicheaux, MS, [facility], [facility] Triage Specialist [phone]

## 2017-04-22 NOTE — ED Notes (Signed)
Patient transported to X-ray 

## 2017-04-22 NOTE — ED Notes (Signed)
Pt continues insulting staff and making demands.

## 2017-04-22 NOTE — Progress Notes (Signed)
52 yo male who presented to the ED with alcohol intoxication and passive suicidal ideations.  On assessment, he denies suicidal/homicidal ideations, hallucinations, or withdrawal symptoms.  Peer support referral placed, court date on Monday for a DWI.  Encouraged him to go to rehab for himself and court date.  Stable for discharge.  [name], PMHNP

## 2017-04-22 NOTE — BH Assessment (Signed)
[facility]  Per Juanetta BeetsJacqueline Norman, DO, this pt does not require psychiatric hospitalization at this time.  [facility] to follow up with a Chemical Dependency Intensive Outpatient Program.  This writer spoke to pt about programs of this type in his community, and it was decided that the program at [facility] is best suited to his needs.  Contact information for [facility] has been included in pt's discharge instructions.  Pt would also benefit from seeing Peer Support Specialists; they will be asked to speak to pt.  Pt's nurse, Kendal Hymendie, has been notified.  Doylene Canninghomas Blanch Stang, MA Triage Specialist [phone]

## 2017-04-22 NOTE — Discharge Instructions (Signed)
To help you maintain a sober lifestyle, a substance abuse treatment program may be beneficial to you.  Contact [facility] and ask them about their Chemical Dependency Intensive Outpatient Program:       [facility] ([facility]      [address]      [place], [place] [address]      [phone]      New patients are seen at the walk-in clinic every Tuesday from 9:00 am - 12:00 pm

## 2017-04-22 NOTE — BHH Suicide Risk Assessment (Signed)
Suicide Risk Assessment  Discharge Assessment   [facility]   Principal Problem: Alcohol abuse with alcohol-induced mood disorder [facility](HCC) Discharge Diagnoses:  Patient Active Problem List   Diagnosis Date Noted  . Alcohol abuse with alcohol-induced mood disorder (HCC) [F10.14] 03/24/2017    Priority: High  . Tachycardia [R00.0]   . Alcohol withdrawal (HCC) [F10.239] 04/03/2017  . DM2 (diabetes mellitus, type 2) (HCC) [E11.9] 04/03/2017  . Frontal lobe contusion (HCC) [S06.339A] 01/23/2017  . Encephalopathy acute [G93.40]   . Fall [W19.XXXA]   . Subarachnoid hemorrhage following injury with brief loss of consciousness but without open intracranial wound (HCC) [S06.6X9A] 07/17/2016  . Traumatic subarachnoid hemorrhage (HCC) [S06.6X9A] 01/15/2015  . Alcohol intoxication (HCC) [F10.929] 01/15/2015  . SAH (subarachnoid hemorrhage) (HCC) [I60.9] 01/15/2015    Total Time spent with patient: 45 minutes  Musculoskeletal: Strength & Muscle Tone: within normal limits Gait & Station: normal Patient leans: N/A  Psychiatric Specialty Exam:   Blood pressure 113/76, pulse 87, temperature 98.6 F (37 C), temperature source Oral, resp. rate 20, SpO2 100 %.There is no height or weight on file to calculate BMI.  General Appearance: Casual  Eye Contact::  Good  Speech:  Normal Rate409  Volume:  Normal  Mood:  Euthymic  Affect:  Congruent  Thought Process:  Coherent and Descriptions of Associations: Intact  Orientation:  Full (Time, Place, and Person)  Thought Content:  WDL and Logical  Suicidal Thoughts:  No  Homicidal Thoughts:  No  Memory:  Immediate;   Good Recent;   Good Remote;   Good  Judgement:  Fair  Insight:  Good  Psychomotor Activity:  Normal  Concentration:  Good  Recall:  Good  Fund of Knowledge:Fair  Language: Good  Akathisia:  No  Handed:  Right  AIMS (if indicated):     Assets:  Housing Leisure Time Physical Health Resilience  Sleep:      Cognition: WNL  ADL's:  Intact   Mental Status Per Nursing Assessment::   On Admission:   52 yo male who presented to the ED with alcohol intoxication and passive suicidal ideations.  On assessment, he denies suicidal/homicidal ideations, hallucinations, or withdrawal symptoms.  Peer support referral placed, court date on Monday for a DWI.  Encouraged him to go to rehab for himself and court date.  Stable for discharge.  Demographic Factors:  Male, Caucasian and Living alone  Loss Factors: NA  Historical Factors: NA  Risk Reduction Factors:   Sense of responsibility to family  Continued Clinical Symptoms:  None  Cognitive Features That Contribute To Risk:  None    Suicide Risk:  Minimal: No identifiable suicidal ideation.  Patients presenting with no risk factors but with morbid ruminations; may be classified as minimal risk based on the severity of the depressive symptoms    Plan Of Care/Follow-up recommendations:  Activity:  as tolerated Diet:  heart healthy diet  Ioanna Colquhoun, NP 04/22/2017, 9:53 AM

## 2017-04-22 NOTE — ED Notes (Signed)
Pt discharged safely after reviewing Discharge instructions and RX.   Pt was given multiple resources.  All belongings were returned to pt including items retrieved from security.

## 2017-04-22 NOTE — Patient Outreach (Signed)
[facility] spoke with the patient and provided substance use recovery support. Patient said he was interested in outpatient substance use treatment since the patient works. [facility] will also provide an AA meeting list for the patient. [facility] will also provide [facility] contact information. [facility] encouraged the patient to contact [facility] at anytime for substance use recovery support.

## 2017-04-22 NOTE — ED Notes (Signed)
Pt presents with intoxication and suicidal thoughts.  A&O x 3, no distress noted, abrasion noted to Left forehead.  Pt being eval by TTS [name].  Denies HI or AVH.  Monitoring for safety, Q 15 min checks in effect.

## 2017-04-26 ENCOUNTER — Emergency Department (HOSPITAL_COMMUNITY)
Admission: EM | Admit: 2017-04-26 | Discharge: 2017-04-26 | Disposition: A | Discharge status: Home / Self Care | Payer: BLUE CROSS/BLUE SHIELD | Attending: Emergency Medicine | Admitting: Emergency Medicine

## 2017-04-26 DIAGNOSIS — Z5321 Procedure and treatment not carried out due to patient leaving prior to being seen by health care provider: Secondary | ICD-10-CM | POA: Insufficient documentation

## 2017-04-26 DIAGNOSIS — F10929 Alcohol use, unspecified with intoxication, unspecified: Secondary | ICD-10-CM | POA: Diagnosis present

## 2017-04-26 NOTE — ED Notes (Signed)
Patient refused to be triaged stating that he does not need to be seen and he is fine. Patient ambulatory in triage and addiment about leaving. Speaking in full sentences and in NAD. IV removed from EMS. Patient stated he was leaving on bus to go to mothers.

## 2017-04-26 NOTE — ED Triage Notes (Signed)
Per EMS-states this is the third time they have been out to patient's residence due to patient's alcohol intoxication-neighbors have been calling-3 rd time patient for patient falling sue to intoxication-patient complaining left knee pain-received 200cc NS bolus in route due to a BP of 93/58, CBG 153, HR 87

## 2017-04-27 ENCOUNTER — Emergency Department (HOSPITAL_COMMUNITY)
Admission: EM | Admit: 2017-04-27 | Discharge: 2017-04-27 | Disposition: A | Discharge status: Home / Self Care | Payer: BLUE CROSS/BLUE SHIELD | Attending: Emergency Medicine | Admitting: Emergency Medicine

## 2017-04-27 ENCOUNTER — Encounter (HOSPITAL_COMMUNITY): Payer: Self-pay

## 2017-04-27 ENCOUNTER — Emergency Department (HOSPITAL_COMMUNITY): Payer: BLUE CROSS/BLUE SHIELD

## 2017-04-27 DIAGNOSIS — F10921 Alcohol use, unspecified with intoxication delirium: Secondary | ICD-10-CM | POA: Diagnosis present

## 2017-04-27 DIAGNOSIS — Z7722 Contact with and (suspected) exposure to environmental tobacco smoke (acute) (chronic): Secondary | ICD-10-CM | POA: Insufficient documentation

## 2017-04-27 DIAGNOSIS — E1122 Type 2 diabetes mellitus with diabetic chronic kidney disease: Secondary | ICD-10-CM | POA: Insufficient documentation

## 2017-04-27 DIAGNOSIS — I129 Hypertensive chronic kidney disease with stage 1 through stage 4 chronic kidney disease, or unspecified chronic kidney disease: Secondary | ICD-10-CM | POA: Insufficient documentation

## 2017-04-27 DIAGNOSIS — Z79899 Other long term (current) drug therapy: Secondary | ICD-10-CM | POA: Diagnosis not present

## 2017-04-27 DIAGNOSIS — N183 Chronic kidney disease, stage 3 (moderate): Secondary | ICD-10-CM | POA: Diagnosis not present

## 2017-04-27 DIAGNOSIS — Z7984 Long term (current) use of oral hypoglycemic drugs: Secondary | ICD-10-CM | POA: Diagnosis not present

## 2017-04-27 LAB — I-STAT CHEM 8, ED
BUN: 10 mg/dL (ref 6–20)
CALCIUM ION: 1.09 mmol/L — AB (ref 1.15–1.40)
CHLORIDE: 104 mmol/L (ref 101–111)
Creatinine, Ser: 1.6 mg/dL — ABNORMAL HIGH (ref 0.61–1.24)
Glucose, Bld: 186 mg/dL — ABNORMAL HIGH (ref 65–99)
HEMATOCRIT: 37 % — AB (ref 39.0–52.0)
Hemoglobin: 12.6 g/dL — ABNORMAL LOW (ref 13.0–17.0)
Potassium: 3.5 mmol/L (ref 3.5–5.1)
SODIUM: 142 mmol/L (ref 135–145)
TCO2: 22 mmol/L (ref 22–32)

## 2017-04-27 LAB — HEPATIC FUNCTION PANEL
ALT: 45 U/L (ref 17–63)
AST: 107 U/L — AB (ref 15–41)
Albumin: 3.1 g/dL — ABNORMAL LOW (ref 3.5–5.0)
Alkaline Phosphatase: 212 U/L — ABNORMAL HIGH (ref 38–126)
BILIRUBIN DIRECT: 0.6 mg/dL — AB (ref 0.1–0.5)
BILIRUBIN INDIRECT: 0.5 mg/dL (ref 0.3–0.9)
Total Bilirubin: 1.1 mg/dL (ref 0.3–1.2)
Total Protein: 7.6 g/dL (ref 6.5–8.1)

## 2017-04-27 LAB — CBC WITH DIFFERENTIAL/PLATELET
BASOS PCT: 0 %
Basophils Absolute: 0 10*3/uL (ref 0.0–0.1)
EOS ABS: 0 10*3/uL (ref 0.0–0.7)
EOS PCT: 0 %
HCT: 33.7 % — ABNORMAL LOW (ref 39.0–52.0)
Hemoglobin: 11.7 g/dL — ABNORMAL LOW (ref 13.0–17.0)
LYMPHS ABS: 2.9 10*3/uL (ref 0.7–4.0)
Lymphocytes Relative: 32 %
MCH: 34.7 pg — AB (ref 26.0–34.0)
MCHC: 34.7 g/dL (ref 30.0–36.0)
MCV: 100 fL (ref 78.0–100.0)
Monocytes Absolute: 0.8 10*3/uL (ref 0.1–1.0)
Monocytes Relative: 8 %
NEUTROS PCT: 60 %
Neutro Abs: 5.4 10*3/uL (ref 1.7–7.7)
Platelets: 146 10*3/uL — ABNORMAL LOW (ref 150–400)
RBC: 3.37 MIL/uL — AB (ref 4.22–5.81)
RDW: 14.9 % (ref 11.5–15.5)
WBC: 9.1 10*3/uL (ref 4.0–10.5)

## 2017-04-27 LAB — PROTIME-INR
INR: 1.18
PROTHROMBIN TIME: 15 s (ref 11.4–15.2)

## 2017-04-27 MED ORDER — HALOPERIDOL LACTATE 5 MG/ML IJ SOLN
5.0000 mg | Freq: Once | INTRAMUSCULAR | Status: AC
  Administered 2017-04-27: 5 mg via INTRAMUSCULAR
  Filled 2017-04-27: qty 1

## 2017-04-27 NOTE — ED Notes (Signed)
Patient transported to CT 

## 2017-04-27 NOTE — ED Triage Notes (Signed)
Patient BIB EMS for alcohol intoxication. No fall is noted but patient not able to walk due to being unstable.

## 2017-04-27 NOTE — ED Provider Notes (Addendum)
[facility]-EMERGENCY DEPT Provider Note   CSN: [id number] Arrival date & time: 04/27/17  0046     History   Chief Complaint Chief Complaint  Patient presents with  . Alcohol Intoxication  . Fall    HPI [name] is a 52 y.o. male.  HPI Level 5 caveat for alcohol intoxication and noncooperation  52 year old male with history of CKD, AAA, alcohol abuse comes in after a fall and struggle with [employer].  According to nursing report by [employer], [place] and [place] were asked to come to the site because patient was stumbling and being belligerent.  Patient required physical restraints prior to being brought in the ER.  In the ER patient continues to be belligerent, demanding that be sent home.  Patient admits to heavy alcohol use today.  He denies any pain anywhere, is noted to be very unsteady with his gait.  Past medical history is also positive for traumatic subarachnoid hemorrhage.  Past Medical History:  Diagnosis Date  . Abdominal aneurysm [facility])    Patient reported   . Chronic kidney disease, stage III (moderate) (HCC)    Deterding  . Hypertension   . Obesity (BMI 30-39.9)   . Other and unspecified hyperlipidemia   . Type II or unspecified type diabetes mellitus without mention of complication, not stated as uncontrolled     Patient Active Problem List   Diagnosis Date Noted  . Tachycardia   . Alcohol withdrawal (HCC) 04/03/2017  . DM2 (diabetes mellitus, type 2) (HCC) 04/03/2017  . Alcohol abuse with alcohol-induced mood disorder (HCC) 03/24/2017  . Frontal lobe contusion (HCC) 01/23/2017  . Encephalopathy acute   . Fall   . Subarachnoid hemorrhage following injury with brief loss of consciousness but without open intracranial wound (HCC) 07/17/2016  . Traumatic subarachnoid hemorrhage (HCC) 01/15/2015  . Alcohol intoxication (HCC) 01/15/2015  . SAH (subarachnoid hemorrhage) (HCC) 01/15/2015    Past Surgical History:    Procedure Laterality Date  . stomach vessel aneurysm     Patient reported        Home Medications    Prior to Admission medications   Medication Sig Start Date End Date Taking? Authorizing Provider  Canagliflozin (INVOKANA PO) Take 300 mg by mouth daily.     [provider]  colesevelam (WELCHOL) 625 MG tablet Take 625 mg by mouth 3 (three) times daily.    [provider]  doxycycline (VIBRA-TABS) 100 MG tablet Take 1 tablet (100 mg total) by mouth every 12 (twelve) hours. 04/12/17   Alison Murray, MD  FLUoxetine (PROZAC) 20 MG tablet Take 20 mg by mouth daily.    [provider]  folic acid (FOLVITE) 1 MG tablet Take 1 tablet (1 mg total) by mouth daily. 01/16/15   Alison Murray, MD  gabapentin (NEURONTIN) 300 MG capsule Take 1 capsule (300 mg total) by mouth 3 (three) times daily. 04/22/17   Charm Rings, NP  levETIRAcetam (KEPPRA) 500 MG tablet Take 1 tablet (500 mg total) by mouth 2 (two) times daily. 07/18/16   Wendee Beavers, DO  lisinopril-hydrochlorothiazide (PRINZIDE,ZESTORETIC) 20-25 MG tablet Take 1 tablet by mouth daily.    [provider]  metFORMIN (GLUCOPHAGE) 500 MG tablet Take 1 tablet (500 mg total) by mouth 2 (two) times daily with a meal. 01/16/15   Alison Murray, MD  thiamine 100 MG tablet Take 1 tablet (100 mg total) by mouth daily. 04/13/17   Alison Murray, MD  Family History Family History  Problem Relation Age of Onset  . Diabetes Mellitus II Mother     Social History Social History   Tobacco Use  . Smoking status: Passive Smoke Exposure - Never Smoker  . Smokeless tobacco: Never Used  Substance Use Topics  . Alcohol use: Yes    Alcohol/week: 7.2 oz    Types: 12 Cans of beer per week  . Drug use: No     Allergies   Patient has no known allergies.   Review of Systems Review of Systems  Unable to perform ROS: Other  Constitutional: Positive for activity change.  Respiratory: Negative for shortness  of breath.   Cardiovascular: Negative for chest pain.  Allergic/Immunologic: Negative for immunocompromised state.  Neurological: Negative for headaches.     Physical Exam Updated Vital Signs BP 107/70 (BP Location: Left Arm)   Pulse 99   Temp 97.8 F (36.6 C)   Resp 16   SpO2 94%   Physical Exam  Constitutional: He appears well-developed.  Disheveled appearing  HENT:  Head: Atraumatic.  Eyes: EOM are normal. Pupils are equal, round, and reactive to light.  Neck: Neck supple.  Cardiovascular: Normal rate.  Pulmonary/Chest: Effort normal.  Abdominal: Soft.  Musculoskeletal:  Head to toe evaluation shows no hematoma, bleeding of the scalp, no facial abrasions, no spine step offs, crepitus of the chest or neck, no tenderness to palpation of the bilateral upper and lower extremities, no gross deformities, no chest tenderness, no pelvic pain.   Neurological: He is alert.  Skin: Skin is warm.  Nursing note and vitals reviewed.    ED Treatments / Results  Labs (all labs ordered are listed, but only abnormal results are displayed) Labs Reviewed  CBC WITH DIFFERENTIAL/PLATELET - Abnormal; Notable for the following components:      Result Value   RBC 3.37 (*)    Hemoglobin 11.7 (*)    HCT 33.7 (*)    MCH 34.7 (*)    Platelets 146 (*)    All other components within normal limits  HEPATIC FUNCTION PANEL - Abnormal; Notable for the following components:   Albumin 3.1 (*)    AST 107 (*)    Alkaline Phosphatase 212 (*)    Bilirubin, Direct 0.6 (*)    All other components within normal limits  I-STAT CHEM 8, ED - Abnormal; Notable for the following components:   Creatinine, Ser 1.60 (*)    Glucose, Bld 186 (*)    Calcium, Ion 1.09 (*)    Hemoglobin 12.6 (*)    HCT 37.0 (*)    All other components within normal limits  PROTIME-INR    EKG  EKG Interpretation None       Radiology Ct Head Wo Contrast  Result Date: 04/27/2017 CLINICAL DATA:  Unsteady gait altered  LOC EXAM: CT HEAD WITHOUT CONTRAST TECHNIQUE: Contiguous axial images were obtained from the base of the skull through the vertex without intravenous contrast. COMPARISON:  04/22/2017, 04/03/2017, 02/13/2017 FINDINGS: Brain: No acute territorial infarction, hemorrhage or intracranial mass is visualized. Moderate atrophy. Stable ventricle size. Vascular: No hyperdense vessels.  Carotid artery calcification. Skull: No depressed skull fracture. Sinuses/Orbits: Mild mucosal thickening in the maxillary sinuses. Postsurgical changes of the right zygoma/superolateral right orbit. Metallic densities near the right nasal bones. Right globe prosthesis. Other: Small left parietal scalp soft tissue swelling. IMPRESSION: 1. No CT evidence for acute intracranial abnormality. 2. Atrophy Electronically Signed   By: Jasmine PangKim  Fujinaga M.D.   On: 04/27/2017  02:19    Procedures Procedures (including critical care time)  Medications Ordered in ED Medications  haloperidol lactate (HALDOL) injection 5 mg (5 mg Intramuscular Given 04/27/17 0126)     Initial Impression / Assessment and Plan / ED Course  I have reviewed the triage vital signs and the nursing notes.  Pertinent labs & imaging results that were available during my care of the patient were reviewed by me and considered in my medical decision making (see chart for details).  Clinical Course as of Apr 27 626  Wed Apr 27, 2017  7829 Patient is clinically sober. He is talking coherently, gait is normal, and is demonstrating rational thought process. We shall discharge him shortly, and we have discussed the warning signs of alcohol withdrawal with him verbally, and the information will be provided with the discharge instructions as well.    [AN]  5621 reviewed CT Head Wo Contrast [AN]    Clinical Course User Index [AN] Derwood Kaplan, MD    52 year old comes in a chief complaint of alcohol intoxication.  Patient has history of alcohol abuse, subarachnoid  hemorrhage.  Patient is belligerent, likely due to his alcohol intoxication.  Patient is also noted to have unsteady gait.  Patient wants to leave, however I think it is unsafe for him to leave.  We will IVC him if needed.  Given the history of subarachnoid hemorrhage, patient being unsteady and having delirium, we will get CT scan of the head.  Final Clinical Impressions(s) / ED Diagnoses   Final diagnoses:  Alcohol intoxication with delirium [facility])    ED Discharge Orders    None       Derwood Kaplan, MD 04/27/17 0240    Derwood Kaplan, MD 04/27/17 [phone]

## 2017-04-29 ENCOUNTER — Emergency Department (HOSPITAL_COMMUNITY): Payer: BLUE CROSS/BLUE SHIELD

## 2017-04-29 ENCOUNTER — Other Ambulatory Visit: Payer: Self-pay

## 2017-04-29 ENCOUNTER — Emergency Department (HOSPITAL_COMMUNITY)
Admission: EM | Admit: 2017-04-29 | Discharge: 2017-04-29 | Disposition: A | Discharge status: Home / Self Care | Payer: BLUE CROSS/BLUE SHIELD | Attending: Emergency Medicine | Admitting: Emergency Medicine

## 2017-04-29 ENCOUNTER — Encounter (HOSPITAL_COMMUNITY): Payer: Self-pay

## 2017-04-29 DIAGNOSIS — Y999 Unspecified external cause status: Secondary | ICD-10-CM | POA: Insufficient documentation

## 2017-04-29 DIAGNOSIS — N183 Chronic kidney disease, stage 3 (moderate): Secondary | ICD-10-CM | POA: Diagnosis not present

## 2017-04-29 DIAGNOSIS — Z7289 Other problems related to lifestyle: Secondary | ICD-10-CM

## 2017-04-29 DIAGNOSIS — R040 Epistaxis: Secondary | ICD-10-CM | POA: Diagnosis not present

## 2017-04-29 DIAGNOSIS — Z789 Other specified health status: Secondary | ICD-10-CM

## 2017-04-29 DIAGNOSIS — W19XXXA Unspecified fall, initial encounter: Secondary | ICD-10-CM

## 2017-04-29 DIAGNOSIS — Z79899 Other long term (current) drug therapy: Secondary | ICD-10-CM | POA: Insufficient documentation

## 2017-04-29 DIAGNOSIS — I129 Hypertensive chronic kidney disease with stage 1 through stage 4 chronic kidney disease, or unspecified chronic kidney disease: Secondary | ICD-10-CM | POA: Diagnosis not present

## 2017-04-29 DIAGNOSIS — Z7984 Long term (current) use of oral hypoglycemic drugs: Secondary | ICD-10-CM | POA: Diagnosis not present

## 2017-04-29 DIAGNOSIS — Y929 Unspecified place or not applicable: Secondary | ICD-10-CM | POA: Diagnosis not present

## 2017-04-29 DIAGNOSIS — S0990XA Unspecified injury of head, initial encounter: Secondary | ICD-10-CM | POA: Diagnosis present

## 2017-04-29 DIAGNOSIS — F1010 Alcohol abuse, uncomplicated: Secondary | ICD-10-CM | POA: Insufficient documentation

## 2017-04-29 DIAGNOSIS — Z23 Encounter for immunization: Secondary | ICD-10-CM | POA: Diagnosis not present

## 2017-04-29 DIAGNOSIS — S0031XA Abrasion of nose, initial encounter: Secondary | ICD-10-CM

## 2017-04-29 DIAGNOSIS — S0033XA Contusion of nose, initial encounter: Secondary | ICD-10-CM | POA: Insufficient documentation

## 2017-04-29 DIAGNOSIS — Y9301 Activity, walking, marching and hiking: Secondary | ICD-10-CM | POA: Diagnosis not present

## 2017-04-29 DIAGNOSIS — W010XXA Fall on same level from slipping, tripping and stumbling without subsequent striking against object, initial encounter: Secondary | ICD-10-CM | POA: Diagnosis not present

## 2017-04-29 DIAGNOSIS — Z7722 Contact with and (suspected) exposure to environmental tobacco smoke (acute) (chronic): Secondary | ICD-10-CM | POA: Insufficient documentation

## 2017-04-29 DIAGNOSIS — E1122 Type 2 diabetes mellitus with diabetic chronic kidney disease: Secondary | ICD-10-CM | POA: Diagnosis not present

## 2017-04-29 MED ORDER — TETANUS-DIPHTH-ACELL PERTUSSIS 5-2.5-18.5 LF-MCG/0.5 IM SUSP
0.5000 mL | Freq: Once | INTRAMUSCULAR | Status: AC
  Administered 2017-04-29: 0.5 mL via INTRAMUSCULAR
  Filled 2017-04-29: qty 0.5

## 2017-04-29 NOTE — ED Notes (Signed)
Bed: [facility] Expected date:  Expected time:  Means of arrival:  Comments: 

## 2017-04-29 NOTE — ED Notes (Signed)
Pt ambulates with steady gait. Witnessed with Margaretmary DysAshley L RN.

## 2017-04-29 NOTE — ED Provider Notes (Signed)
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 tripped on his shoelace and fell on rocks, so he was transported. He has no complaints and wants to go home. He is A&Ox4 and clinically is actually sober, he is able to walk with a steady gait. No scalp tenderness or crepitus, mild nasal bridge swelling and a few abrasions but no tenderness or crepitus/gross deformity. No focal neuro deficits, no spinal tenderness, no s/sx of basilar skull fx, no ongoing epistaxis or septal hematoma. Will update Tdap and get CT head/maxillofacial/cervical spine to ensure no injuries. Doubt need for labs. Will reassess shortly.   4:28 PM CT head/face/neck with stable chronic findings in the head/face/neck however no acute findings related to today's fall. Doubt need for further emergent intervention or work up today. Pt is clinically sober and able to safely be discharged back to his usual home environment. Advised proper wound care, use of ice on face/head, and what to do to avoid nose bleeds or stop one if it starts. Advised alcohol cessation. F/up with [facility] in 1wk for recheck of symptoms and to establish medical care. I explained the  diagnosis and have given explicit precautions to return to the ER including for any other new or worsening symptoms. The patient understands and accepts the medical plan as it's been dictated and I have answered their questions. Discharge instructions concerning home care and prescriptions have been given. The patient is STABLE and is discharged to home in good condition.    Final Clinical Impressions(s) / ED Diagnoses   Final diagnoses:  Alcohol use  Fall, initial encounter  Contusion of nose, initial encounter  Abrasion of nose, initial encounter  Epistaxis    ED Discharge Orders    [address] [place], [place] 04/29/17 1628    Raeford Razor, MD 04/30/17 [phone]

## 2017-04-29 NOTE — Discharge Instructions (Signed)
STOP DRINKING ALCOHOL! Alternate between Ibuprofen and Tylenol for pain. Get plenty of rest, use ice on your head/face to help with pain and swelling.  Use topical antibiotic ointment and a bandage on the abrasions you have. Avoid blowing your nose forcefully to avoid having nose bleed. If you have a nose bleed, hold pressure on your nose for 15 minutes or until the bleeding stops. Follow Up with [facility] in 5-7 days for recheck of symptoms and to establish medical care.  Return to the emergency department if patient becomes lethargic, begins vomiting or other change in mental status, or any other changes/worsening symptoms.

## 2017-04-29 NOTE — ED Triage Notes (Signed)
Pt BIB EMS for fall and alcohol intoxication. EMS was called out 3x by neighbors and transportation was refused until 3rd time. Pt states he stepped on a shoe lace and fell forward, falling on his face and hit his nose. Dried blood noted to nose. Denies LOC. States hes drank less than 1/2 a pint of vodka. Pt''s only complaint is hunger at this time.

## 2017-05-03 ENCOUNTER — Emergency Department (HOSPITAL_COMMUNITY)
Admission: EM | Admit: 2017-05-03 | Discharge: 2017-05-03 | Disposition: A | Discharge status: Home / Self Care | Payer: BLUE CROSS/BLUE SHIELD | Attending: Emergency Medicine | Admitting: Emergency Medicine

## 2017-05-03 DIAGNOSIS — Z5321 Procedure and treatment not carried out due to patient leaving prior to being seen by health care provider: Secondary | ICD-10-CM | POA: Insufficient documentation

## 2017-05-03 DIAGNOSIS — F1010 Alcohol abuse, uncomplicated: Secondary | ICD-10-CM | POA: Diagnosis not present

## 2017-05-03 DIAGNOSIS — F329 Major depressive disorder, single episode, unspecified: Secondary | ICD-10-CM | POA: Insufficient documentation

## 2017-05-03 NOTE — ED Triage Notes (Signed)
Pt to ED via [facility] with c/o depression and ETOH.  Pt st's "I want a sandwich"

## 2017-05-03 NOTE — ED Notes (Signed)
Pt yelling at everyone in lobby, requesting something to eat.  Pt said to this nurse "can you help me, I need something to eat"  Explained to pt that he needed to be triaged and seen by MD before we could give him something to eat.  Pt st's "I'am leaving, you can't do nothing for me"  Pt stood up from wheelchair and bottle of whisky fell onto floor.   [employer]  Explained to pt that he could be charged with open container, so he poured out the ETOH.  Pt left the ED

## 2017-05-04 ENCOUNTER — Emergency Department (HOSPITAL_COMMUNITY)
Admission: EM | Admit: 2017-05-04 | Discharge: 2017-05-04 | Discharge status: Against Medical Advice | Payer: BLUE CROSS/BLUE SHIELD

## 2017-05-04 NOTE — ED Notes (Signed)
When trying to assess patient's vitals, he reports that he needs a sandwich. I stated that he cannot have a sandwich until the provider sees him. He states that he just wants to leave then. He proceeds to ask for a bus pass. I explained to the patient that it is inappropriate to take EMS to the hospital just to request a bus pass from us. He states that he did not have breakfast. I asked if he had anything that he needed to see a provider for, he stated "no." He also called the other triage nurse a "puta." He was shown the phone in the lobby if he needs to call a ride.

## 2017-05-04 NOTE — ED Triage Notes (Signed)
Pt arrives from home with [facility]. He initially asked EMS from a sandwich and they explained that the hospital is not the place for that. They report that he then  stated that he cant eat and he fell 3 days ago. He is now asking for a sandwich in triage.

## 2017-05-05 ENCOUNTER — Emergency Department (HOSPITAL_COMMUNITY)
Admission: EM | Admit: 2017-05-05 | Discharge: 2017-05-06 | Discharge status: Against Medical Advice | Payer: BLUE CROSS/BLUE SHIELD

## 2017-05-05 ENCOUNTER — Emergency Department (HOSPITAL_COMMUNITY)
Admission: EM | Admit: 2017-05-05 | Discharge: 2017-05-05 | Discharge status: Against Medical Advice | Payer: BLUE CROSS/BLUE SHIELD

## 2017-05-05 NOTE — ED Notes (Signed)
Pt requesting a sandwich

## 2017-05-05 NOTE — ED Triage Notes (Signed)
Pt came in by EMS stating he was hungry and wanted a sandwich  Pt has no medical complaints   Pt placed in lobby  Pt was walking around asking people for cab money so he could go home  Off duty to lobby and spoke with pt  Pt states he is not staying and has called a cab

## 2017-05-05 NOTE — ED Triage Notes (Signed)
Per EMS-states he went [facility] the [facility] to sell his guitar-states store called EMS due to patient being intoxicated

## 2017-05-05 NOTE — ED Notes (Signed)
Called Pt in lobby for vitals, no response, do not see Pt.

## 2017-05-08 ENCOUNTER — Encounter (HOSPITAL_COMMUNITY): Payer: Self-pay | Admitting: Emergency Medicine

## 2017-05-08 ENCOUNTER — Emergency Department (HOSPITAL_COMMUNITY)
Admission: EM | Admit: 2017-05-08 | Discharge: 2017-05-09 | Disposition: A | Discharge status: Home / Self Care | Payer: Medicaid Other | Attending: Emergency Medicine | Admitting: Emergency Medicine

## 2017-05-08 DIAGNOSIS — Z7722 Contact with and (suspected) exposure to environmental tobacco smoke (acute) (chronic): Secondary | ICD-10-CM | POA: Insufficient documentation

## 2017-05-08 DIAGNOSIS — F1014 Alcohol abuse with alcohol-induced mood disorder: Secondary | ICD-10-CM | POA: Diagnosis not present

## 2017-05-08 DIAGNOSIS — I129 Hypertensive chronic kidney disease with stage 1 through stage 4 chronic kidney disease, or unspecified chronic kidney disease: Secondary | ICD-10-CM | POA: Insufficient documentation

## 2017-05-08 DIAGNOSIS — N183 Chronic kidney disease, stage 3 (moderate): Secondary | ICD-10-CM | POA: Insufficient documentation

## 2017-05-08 DIAGNOSIS — E1122 Type 2 diabetes mellitus with diabetic chronic kidney disease: Secondary | ICD-10-CM | POA: Diagnosis not present

## 2017-05-08 LAB — CBC WITH DIFFERENTIAL/PLATELET
Basophils Absolute: 0 10*3/uL (ref 0.0–0.1)
Basophils Relative: 0 %
EOS ABS: 0 10*3/uL (ref 0.0–0.7)
EOS PCT: 1 %
HCT: 31.5 % — ABNORMAL LOW (ref 39.0–52.0)
Hemoglobin: 10.9 g/dL — ABNORMAL LOW (ref 13.0–17.0)
LYMPHS ABS: 1.6 10*3/uL (ref 0.7–4.0)
Lymphocytes Relative: 26 %
MCH: 34.5 pg — AB (ref 26.0–34.0)
MCHC: 34.6 g/dL (ref 30.0–36.0)
MCV: 99.7 fL (ref 78.0–100.0)
MONOS PCT: 7 %
Monocytes Absolute: 0.5 10*3/uL (ref 0.1–1.0)
Neutro Abs: 4.1 10*3/uL (ref 1.7–7.7)
Neutrophils Relative %: 66 %
PLATELETS: 155 10*3/uL (ref 150–400)
RBC: 3.16 MIL/uL — AB (ref 4.22–5.81)
RDW: 14 % (ref 11.5–15.5)
WBC: 6.3 10*3/uL (ref 4.0–10.5)

## 2017-05-08 LAB — COMPREHENSIVE METABOLIC PANEL
ALT: 30 U/L (ref 17–63)
AST: 72 U/L — AB (ref 15–41)
Albumin: 2.7 g/dL — ABNORMAL LOW (ref 3.5–5.0)
Alkaline Phosphatase: 223 U/L — ABNORMAL HIGH (ref 38–126)
Anion gap: 11 (ref 5–15)
CHLORIDE: 104 mmol/L (ref 101–111)
CO2: 24 mmol/L (ref 22–32)
Calcium: 8.2 mg/dL — ABNORMAL LOW (ref 8.9–10.3)
Creatinine, Ser: 0.67 mg/dL (ref 0.61–1.24)
GFR calc Af Amer: >60 mL/min (ref >60)
Glucose, Bld: 210 mg/dL — ABNORMAL HIGH (ref 65–99)
Potassium: 3.6 mmol/L (ref 3.5–5.1)
SODIUM: 139 mmol/L (ref 135–145)
Total Bilirubin: 1.5 mg/dL — ABNORMAL HIGH (ref 0.3–1.2)
Total Protein: 7.3 g/dL (ref 6.5–8.1)

## 2017-05-08 LAB — RAPID URINE DRUG SCREEN, HOSP PERFORMED
AMPHETAMINES: NOT DETECTED
BENZODIAZEPINES: NOT DETECTED
Barbiturates: NOT DETECTED
Cocaine: NOT DETECTED
Opiates: NOT DETECTED
Tetrahydrocannabinol: NOT DETECTED

## 2017-05-08 LAB — ETHANOL: ALCOHOL ETHYL (B): 264 mg/dL — AB (ref <10)

## 2017-05-08 NOTE — BH Assessment (Signed)
[facility] Assessment Progress Note     TTS unable to assess at this time due to current BAL of 392

## 2017-05-08 NOTE — ED Triage Notes (Signed)
Patient brought in by [employer] from home. States " I only drink some beer not alcohol". Reports that he is SI "sometimes".

## 2017-05-08 NOTE — Progress Notes (Signed)
Per Nira ConnJason Berry, NP pt is recommended for overnight observation due to suicidal remarks made according to the IVC and to be reassessed by psychiatry in the AM. EDP Gwyneth SproutPlunkett, Whitney, MD and pt's nurse Darel HongJudy, RN have been advised of the disposition.  Princess BruinsAquicha Jesiah Yerby, MSW, LCSW Therapeutic Triage Specialist  [phone]

## 2017-05-08 NOTE — ED Notes (Signed)
SBAR Report received from previous nurse. Pt received calm and visible on unit. Pt denies current SI/ HI, A/V H, depression, anxiety, or pain at this time, and appears otherwise stable and free of distress, but irritated at being under IVC and having to have moved beds. Pt reminded of camera surveillance, q 15 min rounds, and rules of the milieu. Will continue to assess.

## 2017-05-08 NOTE — ED Notes (Signed)
Pt came to desk reporting that he is lonely and wants someone to come into his room and talk with him. Writer informed pt that staff are here to keep him safe, not entertain him.

## 2017-05-08 NOTE — ED Notes (Signed)
Bed: [id number] Expected date:  Expected time:  Means of arrival:  Comments: [place]

## 2017-05-08 NOTE — BH Assessment (Addendum)
Assessment Note  Christopher ReichertManuel Shaffer is an 52 y.o. male who presents to the ED under IVC initiated by [employer]. According to the IVC, the pt "called police and stated he has lost his job and his wife. He feels alone and feels suicidal and homicidal. He is very depressed and feels strong towards revenge. He is a danger to himself and others and needs to be evaluated for possible mental illness." When TTS entered the room the pt stated "I'm so happy to talk to you. Can I please be discharged. I have to go." Pt denies SI, HI and AVH. Pt has blood all over his hands and when asked what happened to his hands pt stated "I don't know, that is probably from picking my nose." Pt states he feels the officer "misunderstood" what he said and does not know why he is in the hospital. Pt was asked what he said to the officer and he stated "I just told him I was feeling down and wanted someone to talk to."  Pt's BAL is 392 on arrival to the ED. 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 Pt states he wants to go to the train station and buy a ticket to [place] so he can be with his mother. Pt states his mother is 54 years old and she is not doing well. Pt states he has plans to sell his mother's home in [place] and settle with his estranged wife in [place] so that she can keep the home in lieu of their separation.  Pt has an extensive hx of ED visits c/o alcohol intoxication. Pt does not have a current provider but states he used to attend AA meetings. Pt continues to ask this Clinical research associatewriter when he will be d/c so that he can go to [place] to be with his mother.   Per Nira ConnJason Berry, NP pt is recommended for overnight observation due to suicidal remarks made according to the IVC and to be reassessed by psychiatry in the AM. EDP Gwyneth SproutPlunkett, Whitney, MD and pt's nurse Darel HongJudy, RN have been advised  of the disposition.  Diagnosis: Alcohol Intoxication   Past Medical History:  Past Medical History:  Diagnosis Date  . Abdominal aneurysm [facility](HCC)    Patient reported   . Chronic kidney disease, stage III (moderate) (HCC)    Deterding  . Hypertension   . Obesity (BMI 30-39.9)   . Other and unspecified hyperlipidemia   . Type II or unspecified type diabetes mellitus without mention of complication, not stated as uncontrolled     Past Surgical History:  Procedure Laterality Date  . stomach vessel aneurysm     Patient reported     Family History:  Family History  Problem Relation Age of Onset  . Diabetes Mellitus II Mother     Social History:  reports that he is a non-smoker but has been exposed to tobacco smoke. he has never used smokeless tobacco. He reports that he drinks about 7.2 oz of alcohol per week. He reports that he does not use drugs.  Additional Social History:  Alcohol / Drug Use Pain Medications: See MAR Prescriptions: See MAR Over the Counter: See MAR History of alcohol / drug use?: Yes Longest period of sobriety (when/how long): unknown Negative Consequences of Use: Financial, Legal, Personal relationships, Work / School Withdrawal Symptoms: Cramps,  Agitation, Tingling, Nausea / Vomiting, Tremors, Diarrhea Substance #1 Name of Substance 1: Alcohol. 1 - Age of First Use: 23 1 - Amount (size/oz): varies 1 - Frequency: daily 1 - Duration: ongoing 1 - Last Use / Amount: 05/08/17  CIWA: CIWA-Ar BP: 131/65 Pulse Rate: 96 COWS:    Allergies: No Known Allergies  Home Medications:  (Not in a hospital admission)  OB/GYN Status:  No LMP for male patient.  General Assessment Data Assessment unable to be completed: Yes Reason for not completing assessment: (BAL 392) Location of Assessment: [facility] ED TTS Assessment: In system Is this a Tele or Face-to-Face Assessment?: Face-to-Face Is this an Initial Assessment or a Re-assessment for this encounter?: Initial  Assessment Marital status: Separated Is patient pregnant?: No Pregnancy Status: No Living Arrangements: Alone Can pt return to current living arrangement?: Yes Admission Status: Involuntary Is patient capable of signing voluntary admission?: No Referral Source: Self/Family/Friend Insurance type: BCBS     Crisis Care Plan Living Arrangements: Alone Name of Psychiatrist: none Name of Therapist: none  Education Status Is patient currently in school?: No Highest grade of school patient has completed: [employer]  Risk to self with the past 6 months Suicidal Ideation: Yes-Currently Present Has patient been a risk to self within the past 6 months prior to admission? : Yes Suicidal Intent: No Has patient had any suicidal intent within the past 6 months prior to admission? : No Is patient at risk for suicide?: Yes Suicidal Plan?: No Has patient had any suicidal plan within the past 6 months prior to admission? : No Access to Means: No What has been your use of drugs/alcohol within the last 12 months?: admits to daily alcohol use  Previous Attempts/Gestures: No(pt denies) Triggers for Past Attempts: None known Intentional Self Injurious Behavior: None Family Suicide History: No Recent stressful life event(s): Loss (Comment), Job Loss(pt is separated from his wife) Persecutory voices/beliefs?: No Depression: No Substance abuse history and/or treatment for substance abuse?: Yes Suicide prevention information given to non-admitted patients: Not applicable  Risk to Others within the past 6 months Homicidal Ideation: Yes-Currently Present(per IVC, pt denies ) Does patient have any lifetime risk of violence toward others beyond the six months prior to admission? : Yes (comment)(IVC states pt is homicidal, pt denies) Thoughts of Harm to Others: Yes-Currently Present Comment - Thoughts of Harm to Others: per IVC, pt has thoughts of revenege and expressed HI to [employer]  Current Homicidal  Intent: No Current Homicidal Plan: No Access to Homicidal Means: No History of harm to others?: No Assessment of Violence: On admission Violent Behavior Description: per TCU nurse, pt became aggressive while in the ED  Does patient have access to weapons?: No Criminal Charges Pending?: Yes Describe Pending Criminal Charges: DUI Does patient have a court date: Yes Court Date: 05/17/17 Is patient on probation?: No  Psychosis Hallucinations: None noted Delusions: None noted  Mental Status Report Appearance/Hygiene: Disheveled, In scrubs, Poor hygiene(blood on hands ) Eye Contact: Good Motor Activity: Freedom of movement, Unremarkable Speech: Logical/coherent Level of Consciousness: Alert, Irritable Mood: Anxious Affect: Anxious Anxiety Level: Moderate Thought Processes: Relevant, Coherent Judgement: Impaired Orientation: Person, Time, Place, Situation, Appropriate for developmental age Obsessive Compulsive Thoughts/Behaviors: None  Cognitive Functioning Concentration: Normal Memory: Recent Intact, Remote Intact IQ: Average Insight: Poor Impulse Control: Poor Appetite: Good Sleep: No Change Total Hours of Sleep: 8 Vegetative Symptoms: None  ADLScreening [facility] Assessment Services) Patient's cognitive ability adequate to safely complete daily activities?: Yes Patient able to  express need for assistance with ADLs?: Yes Independently performs ADLs?: Yes (appropriate for developmental age)  Prior Inpatient Therapy Prior Inpatient Therapy: No  Prior Outpatient Therapy Prior Outpatient Therapy: No Does patient have an ACCT team?: No Does patient have Intensive In-House Services?  : No Does patient have [facility] services? : No Does patient have P4CC services?: No  ADL Screening (condition at time of admission) Patient's cognitive ability adequate to safely complete daily activities?: Yes Is the patient deaf or have difficulty hearing?: No Does the patient have difficulty  seeing, even when wearing glasses/contacts?: No Does the patient have difficulty concentrating, remembering, or making decisions?: No Patient able to express need for assistance with ADLs?: Yes Does the patient have difficulty dressing or bathing?: No Independently performs ADLs?: Yes (appropriate for developmental age) Does the patient have difficulty walking or climbing stairs?: No Weakness of Legs: None Weakness of Arms/Hands: None  Home Assistive Devices/Equipment Home Assistive Devices/Equipment: None    Abuse/Neglect Assessment (Assessment to be complete while patient is alone) Abuse/Neglect Assessment Can Be Completed: Yes Physical Abuse: Denies Verbal Abuse: Denies Sexual Abuse: Denies Exploitation of patient/patient's resources: Denies Self-Neglect: Denies     Merchant navy officer (For Healthcare) Does Patient Have a Medical Advance Directive?: No Would patient like information on creating a medical advance directive?: No - Patient declined    Additional Information 1:1 In Past 12 Months?: No CIRT Risk: Yes Elopement Risk: No Does patient have medical clearance?: Yes     Disposition: Per Nira Conn, NP pt is recommended for overnight observation due to suicidal remarks made according to the IVC and to be reassessed by psychiatry in the AM. EDP Gwyneth Sprout, MD and pt's nurse Darel Hong, RN have been advised of the disposition.  Disposition Initial Assessment Completed for this Encounter: Yes Disposition of Patient: Re-evaluation by Psychiatry recommended(per Nira Conn, NP)  On Site Evaluation by:   Reviewed with Physician:    Karolee Ohs 05/08/2017 9:00 PM

## 2017-05-08 NOTE — ED Notes (Signed)
[employer] -Patient is now IVC'd.

## 2017-05-08 NOTE — ED Notes (Signed)
Bed: WA28 Expected date:  Expected time:  Means of arrival:  Comments: 

## 2017-05-08 NOTE — ED Notes (Signed)
Patient yelling at nurses station.

## 2017-05-08 NOTE — ED Provider Notes (Signed)
[facility]-EMERGENCY DEPT Provider Note   CSN: [id number] Arrival date & time: 05/08/17  1406     History   Chief Complaint Chief Complaint  Patient presents with  . Alcohol Intoxication    HPI [name] is a 52 y.o. male.  Pt is 52 y/o male with hx of AAA, CKD, HTN, DM which are untreated who presents today with [employer] for alcohol intoxication and passive SI.  Pt states he was drinking today and called the police to "touch base."  He states he was feeling a little depressed and wanted some attention.  He currently is denying SI but admitted to police he has SI sometimes.  Pt now has family here and states he feels better and wants to go home and then get on a bus to [facility] and visit his elderly mother.  He denies SI and HI.  Pt does not take meds for his medical issues or mental health.  He has no further complaints but continually asked to leave.  Pt was IVC'd after arrival.   Pt was seen here 3 days ago and at that time was intoxicated and fell face down after tripping on his shoelace.  At that time pt denied SI/HI and was d/ced home after he was sober.   The history is provided by the patient.  Alcohol Intoxication  This is a recurrent problem. The current episode started 6 to 12 hours ago. The problem occurs constantly. The problem has been gradually improving.    Past Medical History:  Diagnosis Date  . Abdominal aneurysm [facility])    Patient reported   . Chronic kidney disease, stage III (moderate) (HCC)    Deterding  . Hypertension   . Obesity (BMI 30-39.9)   . Other and unspecified hyperlipidemia   . Type II or unspecified type diabetes mellitus without mention of complication, not stated as uncontrolled     Patient Active Problem List   Diagnosis Date Noted  . Tachycardia   . Alcohol withdrawal (HCC) 04/03/2017  . DM2 (diabetes mellitus, type 2) (HCC) 04/03/2017  . Alcohol abuse with alcohol-induced mood disorder (HCC) 03/24/2017  . Frontal  lobe contusion (HCC) 01/23/2017  . Encephalopathy acute   . Fall   . Subarachnoid hemorrhage following injury with brief loss of consciousness but without open intracranial wound (HCC) 07/17/2016  . Traumatic subarachnoid hemorrhage (HCC) 01/15/2015  . Alcohol intoxication (HCC) 01/15/2015  . SAH (subarachnoid hemorrhage) (HCC) 01/15/2015    Past Surgical History:  Procedure Laterality Date  . stomach vessel aneurysm     Patient reported        Home Medications    Prior to Admission medications   Not on File    Family History Family History  Problem Relation Age of Onset  . Diabetes Mellitus II Mother     Social History Social History   Tobacco Use  . Smoking status: Passive Smoke Exposure - Never Smoker  . Smokeless tobacco: Never Used  Substance Use Topics  . Alcohol use: Yes    Alcohol/week: 7.2 oz    Types: 12 Cans of beer per week  . Drug use: No     Allergies   Patient has no known allergies.   Review of Systems Review of Systems  All other systems reviewed and are negative.    Physical Exam Updated Vital Signs BP 128/84 (BP Location: Left Arm)   Pulse (!) 106   Temp 98.8 F (37.1 C) (Oral)   Resp 16  SpO2 100%   Physical Exam  Constitutional: He is oriented to person, place, and time. He appears well-developed and well-nourished. No distress.  HENT:  Head: Normocephalic and atraumatic.  Mouth/Throat: Oropharynx is clear and moist.  Healing ecchymosis of bilateral orbits.  Healing superficial abrasions over the nose  Eyes: Conjunctivae and EOM are normal. Pupils are equal, round, and reactive to light.  Right eye without vision and restricted eye movement.  Left eye is normal movement and vision.  Neck: Normal range of motion. Neck supple.  Cardiovascular: Regular rhythm and intact distal pulses. Tachycardia present.  No murmur heard. Pulmonary/Chest: Effort normal and breath sounds normal. No respiratory distress. He has no wheezes. He  has no rales.  Abdominal: Soft. He exhibits no distension. There is no tenderness. There is no rebound and no guarding.  Musculoskeletal: Normal range of motion. He exhibits no edema or tenderness.  Neurological: He is alert and oriented to person, place, and time.  Skin: Skin is warm and dry. No rash noted. No erythema.  Psychiatric: He has a normal mood and affect. His speech is normal and behavior is normal. He is not actively hallucinating. Thought content is not paranoid and not delusional. Cognition and memory are normal. He expresses inappropriate judgment. He expresses no homicidal and no suicidal ideation. He expresses no suicidal plans and no homicidal plans.  Nursing note and vitals reviewed.    ED Treatments / Results  Labs (all labs ordered are listed, but only abnormal results are displayed) Labs Reviewed  COMPREHENSIVE METABOLIC PANEL  ETHANOL  RAPID URINE DRUG SCREEN, HOSP PERFORMED  CBC WITH DIFFERENTIAL/PLATELET    EKG  EKG Interpretation None       Radiology No results found.  Procedures Procedures (including critical care time)  Medications Ordered in ED Medications - No data to display   Initial Impression / Assessment and Plan / ED Course  I have reviewed the triage vital signs and the nursing notes.  Pertinent labs & imaging results that were available during my care of the patient were reviewed by me and considered in my medical decision making (see chart for details).     Pt presenting under IVC after calling the police, using alcohol and states SI sometimes.  Pt's son is now here and he states feeling much better and wanting to go home.  Pt has chronic medical problems but does not take treatment.  He is cooperative at this time and denying SI/HI.  Pt requesting to go home.  He does not seem clinically intoxicated at this point.  Healing wounds of his face.  Does not seem to have urgent medical condition at this time and cleared for  psych.  Final Clinical Impressions(s) / ED Diagnoses   Final diagnoses:  None    ED Discharge Orders    None       Gwyneth SproutPlunkett, Toshiko Kemler, MD 05/08/17 2340

## 2017-05-09 ENCOUNTER — Emergency Department (HOSPITAL_COMMUNITY): Payer: Medicaid Other

## 2017-05-09 DIAGNOSIS — F1014 Alcohol abuse with alcohol-induced mood disorder: Secondary | ICD-10-CM

## 2017-05-09 MED ORDER — ALUM & MAG HYDROXIDE-SIMETH 200-200-20 MG/5ML PO SUSP: 30.0000 mL | Freq: Four times a day (QID) | ORAL | Status: DC | PRN

## 2017-05-09 MED ORDER — ACETAMINOPHEN 325 MG PO TABS
650.0000 mg | ORAL_TABLET | ORAL | Status: DC | PRN
  Administered 2017-05-09: 650 mg via ORAL
  Filled 2017-05-09: qty 2

## 2017-05-09 MED ORDER — ONDANSETRON HCL 4 MG PO TABS: 4.0000 mg | ORAL_TABLET | Freq: Three times a day (TID) | ORAL | Status: DC | PRN

## 2017-05-09 NOTE — [facility] Suicide Risk Assessment (Signed)
Suicide Risk Assessment  Discharge Assessment   [facility]   Principal Problem: Alcohol abuse with alcohol-induced mood disorder [facility](HCC) Discharge Diagnoses:  Patient Active Problem List   Diagnosis Date Noted  . Tachycardia [R00.0]   . Alcohol withdrawal (HCC) [F10.239] 04/03/2017  . DM2 (diabetes mellitus, type 2) (HCC) [E11.9] 04/03/2017  . 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 Frontal lobe contusion (HCC) [S06.339A] 01/23/2017  . Encephalopathy acute [G93.40]   . Fall [W19.XXXA]   . Subarachnoid hemorrhage following injury with brief loss of consciousness but without open intracranial wound (HCC) [S06.6X9A] 07/17/2016  . Traumatic subarachnoid hemorrhage (HCC) [S06.6X9A] 01/15/2015  . Alcohol intoxication (HCC) [F10.929] 01/15/2015  . SAH (subarachnoid hemorrhage) (HCC) [I60.9] 01/15/2015    Total Time spent with patient: 45 minutes  Musculoskeletal: Strength & Muscle Tone: within normal limits Gait & Station: normal Patient leans: N/A  Psychiatric Specialty Exam: Physical Exam  Constitutional: He is oriented to person, place, and time. He appears well-developed.  Cardiovascular: Normal rate.  Respiratory: Effort normal.  Musculoskeletal: Normal range of motion.  Neurological: He is alert and oriented to person, place, and time.  Psychiatric: His speech is normal and behavior is normal. Thought content normal. Cognition and memory are impaired. He expresses impulsivity. He exhibits a depressed mood.   Review of Systems  Psychiatric/Behavioral: Positive for depression and substance abuse. Negative for hallucinations, memory loss and suicidal ideas. The patient is not nervous/anxious and does not have insomnia.   All other systems reviewed and are negative.  Blood pressure (!) 151/88, pulse (!) 102, temperature 97.9 F (36.6 C), temperature source Oral, resp. rate 18, SpO2 94 %.There is no height or weight  on file to calculate BMI. 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 Housing ADL's:  Intact Cognition:  WNL   Mental Status Per Nursing Assessment::   On Admission:   Intoxicated  Demographic Factors:  Male, Caucasian, Low socioeconomic status, Living alone and Unemployed  Loss Factors: Financial problems/change in socioeconomic status  Historical Factors: Impulsivity  Risk Reduction Factors:   Sense of responsibility to family  Continued Clinical Symptoms:  Depression:   Comorbid alcohol abuse/dependence Impulsivity Alcohol/Substance Abuse/Dependencies  Cognitive Features That Contribute To Risk:  Closed-mindedness    Suicide Risk:  Minimal: No identifiable suicidal ideation.  Patients presenting with no risk factors but with morbid ruminations; may be classified as minimal risk based on the severity of the depressive symptoms    Plan Of Care/Follow-up recommendations:  Activity:  as tolerated Diet:  Heart Healthy  Laveda AbbeLaurie Britton Dolce Sylvia, NP 05/09/2017, 12:26 PM

## 2017-05-09 NOTE — Consult Note (Signed)
[facility] Consult   Reason for Consult:  Alcohol intoxication Referring Physician:  EDP Patient Identification: [name] MRN:  [id number] Principal Diagnosis: Alcohol abuse with alcohol-induced mood disorder [facility]) Diagnosis:   Patient Active Problem List   Diagnosis Date Noted  . Tachycardia [R00.0]   . Alcohol withdrawal ([place]) [F10.239] 04/03/2017  . DM2 (diabetes mellitus, type 2) ([place]) [E11.9] 04/03/2017  . Alcohol abuse with alcohol-induced mood disorder ([place]) [F10.14] 03/24/2017  . Frontal lobe contusion ([place]) [S06.339A] 01/23/2017  . Encephalopathy acute [G93.40]   . Fall [W19.XXXA]   . Subarachnoid hemorrhage following injury with brief loss of consciousness but without open intracranial wound ([place]) [S06.6X9A] 07/17/2016  . Traumatic subarachnoid hemorrhage ([place]) [S06.6X9A] 01/15/2015  . Alcohol intoxication ([place]) [F10.929] 01/15/2015  . SAH (subarachnoid hemorrhage) ([place]) [I60.9] 01/15/2015    Total Time spent with patient: 45 minutes  Subjective:   [name] is a 52 y.o. male patient admitted with alcohol intoxication.  HPI: Pt was seen and chart reviewed with treatment team and Dr Mariea Clonts. Pt stated he lost his job, his car, wallet, and phone. Pt stated he uses alcohol, BAL 264, UDS negative. Pt does not connect his substance use with what is happening to him in his life. Pt stated he lives alone. Pt will be seen by Peer Support for assistance with substance abuse resources in the community. Pt is well known to this hospital system and has had 12 ED visits and 2 inpatient admissions in the past 6 months. Pt has secondary gain by seeking admission in the form of food, shelter, and companionship. Pt is psychiatrically clear for discharge.   Past Psychiatric History: As above  Risk to Self: None Risk to Others: None Prior Inpatient Therapy: Prior Inpatient Therapy: No Prior Outpatient Therapy: Prior Outpatient Therapy: No Does patient have  an ACCT team?: No Does patient have Intensive In-House Services?  : No Does patient have [facility] services? : No Does patient have P4CC services?: No  Past Medical History:  Past Medical History:  Diagnosis Date  . Abdominal aneurysm [facility])    Patient reported   . Chronic kidney disease, stage III (moderate) (HCC)    Deterding  . Hypertension   . Obesity (BMI 30-39.9)   . Other and unspecified hyperlipidemia   . Type II or unspecified type diabetes mellitus without mention of complication, not stated as uncontrolled     Past Surgical History:  Procedure Laterality Date  . stomach vessel aneurysm     Patient reported    Family History:  Family History  Problem Relation Age of Onset  . Diabetes Mellitus II Mother    Family Psychiatric  History: Unknown Social History:  Social History   Substance and Sexual Activity  Alcohol Use Yes  . Alcohol/week: 7.2 oz  . Types: 12 Cans of beer per week     Social History   Substance and Sexual Activity  Drug Use No    Social History   Socioeconomic History  . Marital status: Married    Spouse name: None  . Number of children: None  . Years of education: None  . Highest education level: None  Social Needs  . Financial resource strain: None  . Food insecurity - worry: None  . Food insecurity - inability: None  . Transportation needs - medical: None  . Transportation needs - non-medical: None  Occupational History  . None  Tobacco Use  . Smoking status: Passive Smoke Exposure - Never Smoker  . Smokeless  tobacco: Never Used  Substance and Sexual Activity  . Alcohol use: Yes    Alcohol/week: 7.2 oz    Types: 12 Cans of beer per week  . Drug use: No  . Sexual activity: None  Other Topics Concern  . None  Social History Narrative  . None   Additional Social History: N/A    Allergies:  No Known Allergies  Labs:  Results for orders placed or performed during the hospital encounter of 05/08/17 (from the past 48  hour(s))  Comprehensive metabolic panel     Status: Abnormal   Collection Time: 05/08/17  4:41 PM  Result Value Ref Range   Sodium 139 135 - 145 mmol/L   Potassium 3.6 3.5 - 5.1 mmol/L   Chloride 104 101 - 111 mmol/L   CO2 24 22 - 32 mmol/L   Glucose, Bld 210 (H) 65 - 99 mg/dL   BUN <5 (L) 6 - 20 mg/dL   Creatinine, Ser 0.67 0.61 - 1.24 mg/dL   Calcium 8.2 (L) 8.9 - 10.3 mg/dL   Total Protein 7.3 6.5 - 8.1 g/dL   Albumin 2.7 (L) 3.5 - 5.0 g/dL   AST 72 (H) 15 - 41 U/L   ALT 30 17 - 63 U/L   Alkaline Phosphatase 223 (H) 38 - 126 U/L   Total Bilirubin 1.5 (H) 0.3 - 1.2 mg/dL   GFR calc non Af Amer >60 >60 mL/min   GFR calc Af Amer >60 >60 mL/min    Comment: (NOTE) The eGFR has been calculated using the CKD EPI equation. This calculation has not been validated in all clinical situations. eGFR's persistently <60 mL/min signify possible Chronic Kidney Disease.    Anion gap 11 5 - 15    Comment: Performed at [facility], [place] [address], [place], [place] [address]  Ethanol     Status: Abnormal   Collection Time: 05/08/17  4:41 PM  Result Value Ref Range   Alcohol, Ethyl (B) 264 (H) <10 mg/dL    Comment:        LOWEST DETECTABLE LIMIT FOR SERUM ALCOHOL IS 10 mg/dL FOR MEDICAL PURPOSES ONLY Performed at [facility] [address] [place], [place] [address]   Urine rapid drug screen (hosp performed)     Status: None   Collection Time: 05/08/17  4:41 PM  Result Value Ref Range   Opiates NONE DETECTED NONE DETECTED   Cocaine NONE DETECTED NONE DETECTED   Benzodiazepines NONE DETECTED NONE DETECTED   Amphetamines NONE DETECTED NONE DETECTED   Tetrahydrocannabinol NONE DETECTED NONE DETECTED   Barbiturates NONE DETECTED NONE DETECTED    Comment: (NOTE) DRUG SCREEN FOR MEDICAL PURPOSES ONLY.  IF CONFIRMATION IS NEEDED FOR ANY PURPOSE, NOTIFY LAB WITHIN 5 DAYS. LOWEST DETECTABLE LIMITS FOR URINE DRUG SCREEN Drug Class                      Cutoff (ng/mL) Amphetamine and metabolites    1000 Barbiturate and metabolites    200 Benzodiazepine                 341 Tricyclics and metabolites     300 Opiates and metabolites        300 Cocaine and metabolites        300 THC                            50 Performed at [facility]  [place] [address] [place], [place] [address]   CBC with Diff     Status: Abnormal   Collection Time: 05/08/17  4:41 PM  Result Value Ref Range   WBC 6.3 4.0 - 10.5 K/uL   RBC 3.16 (L) 4.22 - 5.81 MIL/uL   Hemoglobin 10.9 (L) 13.0 - 17.0 g/dL   HCT 31.5 (L) 39.0 - 52.0 %   MCV 99.7 78.0 - 100.0 fL   MCH 34.5 (H) 26.0 - 34.0 pg   MCHC 34.6 30.0 - 36.0 g/dL   RDW 14.0 11.5 - 15.5 %   Platelets 155 150 - 400 K/uL   Neutrophils Relative % 66 %   Neutro Abs 4.1 1.7 - 7.7 K/uL   Lymphocytes Relative 26 %   Lymphs Abs 1.6 0.7 - 4.0 K/uL   Monocytes Relative 7 %   Monocytes Absolute 0.5 0.1 - 1.0 K/uL   Eosinophils Relative 1 %   Eosinophils Absolute 0.0 0.0 - 0.7 K/uL   Basophils Relative 0 %   Basophils Absolute 0.0 0.0 - 0.1 K/uL    Comment: Performed at [facility], [place] [address], [place], [place] [address]    Current Facility-Administered Medications  Medication Dose Route Frequency Provider Last Rate Last Dose  . acetaminophen (TYLENOL) tablet 650 mg  650 mg Oral R5I PRN Delora Fuel, MD   153 mg at 05/09/17 0232  . alum & mag hydroxide-simeth (MAALOX/MYLANTA) 200-200-20 MG/5ML suspension 30 mL  30 mL Oral P9K PRN Delora Fuel, MD      . ondansetron [facility]) tablet 4 mg  4 mg Oral F2X PRN Delora Fuel, MD       No current outpatient medications on file.    Musculoskeletal: Strength & Muscle Tone: within normal limits Gait & Station: normal Patient leans: N/A  Psychiatric Specialty Exam: Physical Exam  Nursing note and vitals reviewed. Constitutional: He is oriented to person, place, and time. He appears well-developed and well-nourished.  HENT:   Head: Normocephalic and atraumatic.  Neck: Normal range of motion.  Cardiovascular: Normal rate.  Respiratory: Effort normal.  Musculoskeletal: Normal range of motion.  Neurological: He is alert and oriented to person, place, and time.  Skin: No rash noted.  Psychiatric: His speech is normal and behavior is normal. Thought content normal. Cognition and memory are impaired. He expresses impulsivity. He exhibits a depressed mood.    Review of Systems  Psychiatric/Behavioral: Positive for depression and substance abuse. Negative for hallucinations, memory loss and suicidal ideas. The patient is not nervous/anxious and does not have insomnia.   All other systems reviewed and are negative.   Blood pressure (!) 151/88, pulse (!) 102, temperature 97.9 F (36.6 C), temperature source Oral, resp. rate 18, SpO2 94 %.There is no height or weight on file to calculate BMI.  General Appearance: Casual  Eye Contact:  Good  Speech:  Clear and Coherent  Volume:  Normal  Mood:  Depressed  Affect:  Congruent  Thought Process:  Coherent  Orientation:  Full (Time, Place, and Person)  Thought Content:  Logical  Suicidal Thoughts:  No  Homicidal Thoughts:  No  Memory:  Immediate;   Good Recent;   Fair Remote;   Fair  Judgement:  Poor  Insight:  Lacking  Psychomotor Activity:  Normal  Concentration:  Concentration: Good and Attention Span: Good  Recall:  Good  Fund of Knowledge:  Good  Language:  Good  Akathisia:  No  Handed:  Right  AIMS (  if indicated):   N/A  Assets:  Agricultural consultant Housing  ADL's:  Intact  Cognition:  WNL  Sleep:   N/A     Treatment Plan Summary: Plan Alcohol abuse with alcohol-induced mood disorder ([facility])  Discharge Home Follow up with substance abuse resources in the community Take all medications as prescribed Avoid the use of illicit drugs and alcohol  Disposition: No evidence of imminent risk to self or others at present.    Patient does not meet criteria for psychiatric inpatient admission. Supportive therapy provided about ongoing stressors. Discussed crisis plan, support from social network, calling 911, coming to the Emergency Department, and calling Suicide Hotline.  Ethelene Hal, NP 05/09/2017 12:07 PM   Patient seen face-to-face for psychiatric evaluation, chart reviewed and case discussed with the physician extender and developed treatment plan. Reviewed the information documented and agree with the treatment plan.  Buford Dresser, DO 05/09/17 5:19 PM

## 2017-05-09 NOTE — ED Notes (Signed)
Pt discharged home. Discharged instructions read to pt who verbalized understanding. All belongings returned to pt who signed for same. Denies SI/HI, is not delusional and not responding to internal stimuli. Escorted pt to the ED exit.    

## 2017-05-09 NOTE — Patient Outreach (Signed)
ED Peer Support Specialist Patient Intake (Complete at intake & 30-60 Day Follow-up)  Name: [name]  MRN: [id number]  Age: 52 y.o.   Date of Admission: 05/09/2017  Intake: Initial Comments:      Primary Reason Admitted: 52 y.o. male who presents to the ED under IVC initiated by [employer]. According to the IVC, the pt "called police and stated he has lost his job and his wife. He feels alone and feels suicidal and homicidal. He is very depressed and feels strong towards revenge. He is a danger to himself and others and needs to be evaluated for possible mental illness." When TTS entered the room the pt stated "I'm so happy to talk to you. Can I please be discharged. I have to go." Pt denies SI, HI and AVH. Pt has blood all over his hands and when asked what happened to his hands pt stated "I don't know, that is probably from picking my nose." Pt states he feels the officer "misunderstood" what he said and does not know why he is in the hospital. Pt was asked what he said to the officer and he stated "I just told him I was feeling down and wanted someone to talk to."  Pt's BAL is 392 on arrival to the ED. 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 Pt states he wants to go to the train station and buy a ticket to [place], [place]. so he can be with his mother. Pt states his mother is 29 years old and she is not doing well. Pt states he has plans to sell his mother's home in [place] and settle with his estranged wife in [place] so that she can keep the home in lieu of their separation.    Lab values: Alcohol/ETOH:   Positive UDS?   Amphetamines:   Barbiturates:   Benzodiazepines:   Cocaine:   Opiates:   Cannabinoids:    Demographic information: Gender: Male Ethnicity: White Marital Status: Separated Insurance Status: Uninsured/Self-pay Education officer, community (Work Neurosurgeon, Physicist, medical, etc.: No Lives with: Alone Living situation: House/Apartment  Reported Patient History: Patient reported health conditions: Depression, Anxiety disorders Patient aware of HIV and hepatitis status: No  In past year, has patient visited ED for any reason? Yes  Number of ED visits:    Reason(s) for visit: same situation   In past year, has patient been hospitalized for any reason? No  Number of hospitalizations:    Reason(s) for hospitalization:    In past year, has patient been arrested? Yes  Number of arrests:    Reason(s) for arrest: Public Intoxication  In past year, has patient been incarcerated? No  Number of incarcerations:    Reason(s) for incarceration:    In past year, has patient received medication-assisted treatment? No  In past year, patient received the following treatments: Individual therapy(5 times and the [facility])  In past year, has patient received any harm reduction services? No  Did this include any of the following?    In past year, has patient received care from a mental health provider for diagnosis other than SUD? No  In past year, is this first time patient has overdosed? No  Number of past overdoses:    In past year, is this first time patient has been hospitalized for an overdose? No  Number of hospitalizations for overdose(s):    Is patient currently receiving treatment for a mental health diagnosis? No  Patient reports experiencing difficulty participating in SUD treatment: No    Most important reason(s) for this difficulty?    Has patient received prior services for treatment? No  In past, patient has received services from following agencies:    Plan of Care:  Suggested follow up at these agencies/treatment centers: Other (comment)  Other information: CPSS met with Pt and was able to complete the assessment and monitor services. CPSS used motivational  interviewing with Pt and tried to find out what route Pt wanted to go to better the quality of his life. CPSS was made aware that Pt just wanted to go home and get a ticket to [place] to visit with his mother an start life over. CPSS gave Pt contact information to follow up with CPSS.    Aaron Edelman Paislea [place], [place]  05/09/2017 12:06 PM

## 2017-05-09 NOTE — ED Provider Notes (Signed)
He was noted to have some ecchymosis and swelling of his right elbow.  X-rays are obtained which showed no evidence of fracture.   Dione BoozeGlick, Chiffon Kittleson, MD 05/09/17 [phone]

## 2017-05-12 ENCOUNTER — Other Ambulatory Visit: Payer: Self-pay

## 2017-05-12 ENCOUNTER — Encounter (HOSPITAL_COMMUNITY): Payer: Self-pay

## 2017-05-12 ENCOUNTER — Emergency Department (HOSPITAL_COMMUNITY)
Admission: EM | Admit: 2017-05-12 | Discharge: 2017-05-12 | Disposition: A | Discharge status: Home / Self Care | Payer: Medicaid Other | Source: Home / Self Care | Attending: Emergency Medicine | Admitting: Emergency Medicine

## 2017-05-12 DIAGNOSIS — F10920 Alcohol use, unspecified with intoxication, uncomplicated: Secondary | ICD-10-CM | POA: Insufficient documentation

## 2017-05-12 DIAGNOSIS — E1122 Type 2 diabetes mellitus with diabetic chronic kidney disease: Secondary | ICD-10-CM | POA: Insufficient documentation

## 2017-05-12 DIAGNOSIS — Z7722 Contact with and (suspected) exposure to environmental tobacco smoke (acute) (chronic): Secondary | ICD-10-CM | POA: Insufficient documentation

## 2017-05-12 DIAGNOSIS — I129 Hypertensive chronic kidney disease with stage 1 through stage 4 chronic kidney disease, or unspecified chronic kidney disease: Secondary | ICD-10-CM | POA: Insufficient documentation

## 2017-05-12 DIAGNOSIS — N183 Chronic kidney disease, stage 3 (moderate): Secondary | ICD-10-CM | POA: Insufficient documentation

## 2017-05-12 LAB — I-STAT CHEM 8, ED
BUN: 5 mg/dL — ABNORMAL LOW (ref 6–20)
Calcium, Ion: 0.95 mmol/L — ABNORMAL LOW (ref 1.15–1.40)
Chloride: 100 mmol/L — ABNORMAL LOW (ref 101–111)
Creatinine, Ser: 1 mg/dL (ref 0.61–1.24)
Glucose, Bld: 173 mg/dL — ABNORMAL HIGH (ref 65–99)
HCT: 27 % — ABNORMAL LOW (ref 39.0–52.0)
Hemoglobin: 9.2 g/dL — ABNORMAL LOW (ref 13.0–17.0)
Potassium: 3.5 mmol/L (ref 3.5–5.1)
Sodium: 139 mmol/L (ref 135–145)
TCO2: 26 mmol/L (ref 22–32)

## 2017-05-12 LAB — CBC WITH DIFFERENTIAL/PLATELET
Basophils Absolute: 0 10*3/uL (ref 0.0–0.1)
Basophils Relative: 0 %
Eosinophils Absolute: 0 10*3/uL (ref 0.0–0.7)
Eosinophils Relative: 0 %
HCT: 27 % — ABNORMAL LOW (ref 39.0–52.0)
Hemoglobin: 9.2 g/dL — ABNORMAL LOW (ref 13.0–17.0)
Lymphocytes Relative: 22 %
Lymphs Abs: 1.5 10*3/uL (ref 0.7–4.0)
MCH: 34.2 pg — ABNORMAL HIGH (ref 26.0–34.0)
MCHC: 34.1 g/dL (ref 30.0–36.0)
MCV: 100.4 fL — ABNORMAL HIGH (ref 78.0–100.0)
Monocytes Absolute: 0.7 10*3/uL (ref 0.1–1.0)
Monocytes Relative: 10 %
Neutro Abs: 4.7 10*3/uL (ref 1.7–7.7)
Neutrophils Relative %: 68 %
Platelets: 133 10*3/uL — ABNORMAL LOW (ref 150–400)
RBC: 2.69 MIL/uL — ABNORMAL LOW (ref 4.22–5.81)
RDW: 14.3 % (ref 11.5–15.5)
WBC: 6.9 10*3/uL (ref 4.0–10.5)

## 2017-05-12 LAB — CBG MONITORING, ED: Glucose-Capillary: 170 mg/dL — ABNORMAL HIGH (ref 65–99)

## 2017-05-12 LAB — ETHANOL: Alcohol, Ethyl (B): 277 mg/dL — ABNORMAL HIGH (ref <10)

## 2017-05-12 MED ORDER — SODIUM CHLORIDE 0.9 % IV BOLUS (SEPSIS)
1000.0000 mL | Freq: Once | INTRAVENOUS | Status: AC
  Administered 2017-05-12: 1000 mL via INTRAVENOUS

## 2017-05-12 NOTE — ED Notes (Signed)
Pt's CBG result was 170. Informed Megan - RN.

## 2017-05-12 NOTE — ED Notes (Addendum)
Pt found to have ripped out his IV and requesting to leave AMA. Fayrene HelperBowie Tran, PA informed. Will continue to monitor.

## 2017-05-12 NOTE — ED Notes (Signed)
ED Provider at bedside. 

## 2017-05-12 NOTE — ED Notes (Signed)
Pt asking to leave. Informed Bowie - PA.
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
## 2017-05-12 NOTE — ED Notes (Signed)
Pt up to ambulate with this RN and Tonya, pt unsteady on feet. Pt complaining of R. Elbow pain. Greta DoomBowie, PA informed.
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
## 2017-05-12 NOTE — ED Notes (Addendum)
Pt refusing discharge vital signs. Pt refusing to sign. This RN reviewed discharge information with pt.

## 2017-05-12 NOTE — ED Notes (Signed)
Pt asking to go home and rest. Informed Megan - RN.

## 2017-05-13 ENCOUNTER — Inpatient Hospital Stay (HOSPITAL_COMMUNITY)
Admission: EM | Admit: 2017-05-13 | Discharge: 2017-07-26 | DRG: 082 | Disposition: A | Discharge status: Skilled Nursing Facility | Payer: Medicaid Other | Attending: Neurosurgery | Admitting: Neurosurgery

## 2017-05-13 ENCOUNTER — Emergency Department (HOSPITAL_COMMUNITY): Payer: Medicaid Other

## 2017-05-13 ENCOUNTER — Other Ambulatory Visit: Payer: Self-pay

## 2017-05-13 ENCOUNTER — Encounter (HOSPITAL_COMMUNITY): Payer: Self-pay | Admitting: Emergency Medicine

## 2017-05-13 DIAGNOSIS — S0633AA Contusion and laceration of cerebrum, unspecified, with loss of consciousness status unknown, initial encounter: Secondary | ICD-10-CM

## 2017-05-13 DIAGNOSIS — I10 Essential (primary) hypertension: Secondary | ICD-10-CM

## 2017-05-13 DIAGNOSIS — R402243 Coma scale, best verbal response, confused conversation, at hospital admission: Secondary | ICD-10-CM | POA: Diagnosis present

## 2017-05-13 DIAGNOSIS — N183 Chronic kidney disease, stage 3 unspecified: Secondary | ICD-10-CM

## 2017-05-13 DIAGNOSIS — Z833 Family history of diabetes mellitus: Secondary | ICD-10-CM

## 2017-05-13 DIAGNOSIS — G8194 Hemiplegia, unspecified affecting left nondominant side: Secondary | ICD-10-CM | POA: Diagnosis present

## 2017-05-13 DIAGNOSIS — G932 Benign intracranial hypertension: Secondary | ICD-10-CM | POA: Diagnosis present

## 2017-05-13 DIAGNOSIS — W19XXXA Unspecified fall, initial encounter: Secondary | ICD-10-CM | POA: Diagnosis present

## 2017-05-13 DIAGNOSIS — R4182 Altered mental status, unspecified: Secondary | ICD-10-CM | POA: Diagnosis present

## 2017-05-13 DIAGNOSIS — S069XAA Unspecified intracranial injury with loss of consciousness status unknown, initial encounter: Secondary | ICD-10-CM | POA: Diagnosis present

## 2017-05-13 DIAGNOSIS — F10931 Alcohol use, unspecified with withdrawal delirium: Secondary | ICD-10-CM

## 2017-05-13 DIAGNOSIS — E1122 Type 2 diabetes mellitus with diabetic chronic kidney disease: Secondary | ICD-10-CM | POA: Diagnosis present

## 2017-05-13 DIAGNOSIS — E876 Hypokalemia: Secondary | ICD-10-CM | POA: Diagnosis present

## 2017-05-13 DIAGNOSIS — R402133 Coma scale, eyes open, to sound, at hospital admission: Secondary | ICD-10-CM | POA: Diagnosis present

## 2017-05-13 DIAGNOSIS — E785 Hyperlipidemia, unspecified: Secondary | ICD-10-CM | POA: Diagnosis present

## 2017-05-13 DIAGNOSIS — I129 Hypertensive chronic kidney disease with stage 1 through stage 4 chronic kidney disease, or unspecified chronic kidney disease: Secondary | ICD-10-CM | POA: Diagnosis present

## 2017-05-13 DIAGNOSIS — D638 Anemia in other chronic diseases classified elsewhere: Secondary | ICD-10-CM | POA: Diagnosis present

## 2017-05-13 DIAGNOSIS — F1026 Alcohol dependence with alcohol-induced persisting amnestic disorder: Secondary | ICD-10-CM | POA: Diagnosis present

## 2017-05-13 DIAGNOSIS — S020XXA Fracture of vault of skull, initial encounter for closed fracture: Secondary | ICD-10-CM | POA: Diagnosis present

## 2017-05-13 DIAGNOSIS — S52022A Displaced fracture of olecranon process without intraarticular extension of left ulna, initial encounter for closed fracture: Secondary | ICD-10-CM | POA: Diagnosis present

## 2017-05-13 DIAGNOSIS — G935 Compression of brain: Secondary | ICD-10-CM | POA: Diagnosis present

## 2017-05-13 DIAGNOSIS — R402363 Coma scale, best motor response, obeys commands, at hospital admission: Secondary | ICD-10-CM | POA: Diagnosis present

## 2017-05-13 DIAGNOSIS — F10231 Alcohol dependence with withdrawal delirium: Secondary | ICD-10-CM

## 2017-05-13 DIAGNOSIS — R6889 Other general symptoms and signs: Secondary | ICD-10-CM

## 2017-05-13 DIAGNOSIS — R52 Pain, unspecified: Secondary | ICD-10-CM

## 2017-05-13 DIAGNOSIS — D62 Acute posthemorrhagic anemia: Secondary | ICD-10-CM | POA: Diagnosis present

## 2017-05-13 DIAGNOSIS — R45851 Suicidal ideations: Secondary | ICD-10-CM | POA: Diagnosis present

## 2017-05-13 DIAGNOSIS — S066XAA Traumatic subarachnoid hemorrhage with loss of consciousness status unknown, initial encounter: Secondary | ICD-10-CM | POA: Diagnosis present

## 2017-05-13 DIAGNOSIS — S065X9A Traumatic subdural hemorrhage with loss of consciousness of unspecified duration, initial encounter: Secondary | ICD-10-CM

## 2017-05-13 DIAGNOSIS — F329 Major depressive disorder, single episode, unspecified: Secondary | ICD-10-CM

## 2017-05-13 DIAGNOSIS — F1024 Alcohol dependence with alcohol-induced mood disorder: Secondary | ICD-10-CM | POA: Diagnosis present

## 2017-05-13 DIAGNOSIS — F10251 Alcohol dependence with alcohol-induced psychotic disorder with hallucinations: Secondary | ICD-10-CM | POA: Diagnosis present

## 2017-05-13 DIAGNOSIS — F1014 Alcohol abuse with alcohol-induced mood disorder: Secondary | ICD-10-CM | POA: Diagnosis present

## 2017-05-13 DIAGNOSIS — R29810 Facial weakness: Secondary | ICD-10-CM | POA: Diagnosis present

## 2017-05-13 DIAGNOSIS — R451 Restlessness and agitation: Secondary | ICD-10-CM

## 2017-05-13 DIAGNOSIS — R414 Neurologic neglect syndrome: Secondary | ICD-10-CM | POA: Diagnosis present

## 2017-05-13 DIAGNOSIS — G9340 Encephalopathy, unspecified: Secondary | ICD-10-CM | POA: Diagnosis present

## 2017-05-13 DIAGNOSIS — F419 Anxiety disorder, unspecified: Secondary | ICD-10-CM | POA: Diagnosis present

## 2017-05-13 DIAGNOSIS — S065XAA Traumatic subdural hemorrhage with loss of consciousness status unknown, initial encounter: Secondary | ICD-10-CM

## 2017-05-13 DIAGNOSIS — D6959 Other secondary thrombocytopenia: Secondary | ICD-10-CM | POA: Diagnosis present

## 2017-05-13 DIAGNOSIS — F1096 Alcohol use, unspecified with alcohol-induced persisting amnestic disorder: Secondary | ICD-10-CM | POA: Diagnosis present

## 2017-05-13 DIAGNOSIS — S069X9A Unspecified intracranial injury with loss of consciousness of unspecified duration, initial encounter: Secondary | ICD-10-CM | POA: Diagnosis present

## 2017-05-13 DIAGNOSIS — G4700 Insomnia, unspecified: Secondary | ICD-10-CM | POA: Diagnosis present

## 2017-05-13 DIAGNOSIS — S06339A Contusion and laceration of cerebrum, unspecified, with loss of consciousness of unspecified duration, initial encounter: Secondary | ICD-10-CM

## 2017-05-13 DIAGNOSIS — Z781 Physical restraint status: Secondary | ICD-10-CM

## 2017-05-13 DIAGNOSIS — F1010 Alcohol abuse, uncomplicated: Secondary | ICD-10-CM

## 2017-05-13 DIAGNOSIS — S60812A Abrasion of left wrist, initial encounter: Secondary | ICD-10-CM | POA: Diagnosis present

## 2017-05-13 DIAGNOSIS — R296 Repeated falls: Secondary | ICD-10-CM | POA: Diagnosis present

## 2017-05-13 DIAGNOSIS — S066X9A Traumatic subarachnoid hemorrhage with loss of consciousness of unspecified duration, initial encounter: Secondary | ICD-10-CM | POA: Diagnosis present

## 2017-05-13 DIAGNOSIS — F32A Depression, unspecified: Secondary | ICD-10-CM

## 2017-05-13 DIAGNOSIS — Z23 Encounter for immunization: Secondary | ICD-10-CM

## 2017-05-13 LAB — TYPE AND SCREEN
ABO/RH(D): O POS
Antibody Screen: NEGATIVE

## 2017-05-13 LAB — CBC WITH DIFFERENTIAL/PLATELET
Basophils Absolute: 0 10*3/uL (ref 0.0–0.1)
Basophils Relative: 0 %
EOS PCT: 0 %
Eosinophils Absolute: 0 10*3/uL (ref 0.0–0.7)
HEMATOCRIT: 29.6 % — AB (ref 39.0–52.0)
Hemoglobin: 10.2 g/dL — ABNORMAL LOW (ref 13.0–17.0)
LYMPHS ABS: 0.7 10*3/uL (ref 0.7–4.0)
Lymphocytes Relative: 7 %
MCH: 34.1 pg — AB (ref 26.0–34.0)
MCHC: 34.5 g/dL (ref 30.0–36.0)
MCV: 99 fL (ref 78.0–100.0)
MONO ABS: 0.9 10*3/uL (ref 0.1–1.0)
Monocytes Relative: 10 %
NEUTROS ABS: 7.4 10*3/uL (ref 1.7–7.7)
Neutrophils Relative %: 83 %
PLATELETS: 123 10*3/uL — AB (ref 150–400)
RBC: 2.99 MIL/uL — AB (ref 4.22–5.81)
RDW: 13.9 % (ref 11.5–15.5)
WBC: 9 10*3/uL (ref 4.0–10.5)

## 2017-05-13 LAB — COMPREHENSIVE METABOLIC PANEL
ALT: 31 U/L (ref 17–63)
AST: 91 U/L — AB (ref 15–41)
Albumin: 2.6 g/dL — ABNORMAL LOW (ref 3.5–5.0)
Alkaline Phosphatase: 224 U/L — ABNORMAL HIGH (ref 38–126)
Anion gap: 13 (ref 5–15)
BILIRUBIN TOTAL: 2 mg/dL — AB (ref 0.3–1.2)
CHLORIDE: 104 mmol/L (ref 101–111)
CO2: 20 mmol/L — ABNORMAL LOW (ref 22–32)
CREATININE: 0.67 mg/dL (ref 0.61–1.24)
Calcium: 7.7 mg/dL — ABNORMAL LOW (ref 8.9–10.3)
Glucose, Bld: 184 mg/dL — ABNORMAL HIGH (ref 65–99)
Potassium: 3 mmol/L — ABNORMAL LOW (ref 3.5–5.1)
Sodium: 137 mmol/L (ref 135–145)
TOTAL PROTEIN: 7.1 g/dL (ref 6.5–8.1)

## 2017-05-13 LAB — APTT: aPTT: 34 seconds (ref 24–36)

## 2017-05-13 LAB — ETHANOL: ALCOHOL ETHYL (B): 68 mg/dL — AB (ref <10)

## 2017-05-13 LAB — GLUCOSE, CAPILLARY
GLUCOSE-CAPILLARY: 155 mg/dL — AB (ref 65–99)
GLUCOSE-CAPILLARY: 114 mg/dL — AB (ref 65–99)
Glucose-Capillary: 204 mg/dL — ABNORMAL HIGH (ref 65–99)
Glucose-Capillary: 135 mg/dL — ABNORMAL HIGH (ref 65–99)

## 2017-05-13 LAB — PROTIME-INR
INR: 1.23
PROTHROMBIN TIME: 15.4 s — AB (ref 11.4–15.2)

## 2017-05-13 LAB — MRSA PCR SCREENING: MRSA by PCR: POSITIVE — AB

## 2017-05-13 LAB — SALICYLATE LEVEL

## 2017-05-13 LAB — CBG MONITORING, ED: Glucose-Capillary: 149 mg/dL — ABNORMAL HIGH (ref 65–99)

## 2017-05-13 LAB — ACETAMINOPHEN LEVEL

## 2017-05-13 MED ORDER — LORAZEPAM 2 MG/ML IJ SOLN
2.0000 mg | Freq: Once | INTRAMUSCULAR | Status: AC
  Administered 2017-05-13: 2 mg via INTRAVENOUS
  Filled 2017-05-13: qty 1

## 2017-05-13 MED ORDER — ACETAMINOPHEN 325 MG PO TABS
650.0000 mg | ORAL_TABLET | ORAL | Status: DC | PRN
  Administered 2017-05-21 – 2017-07-19 (×2): 650 mg via ORAL
  Filled 2017-05-13 (×2): qty 2

## 2017-05-13 MED ORDER — ACETAMINOPHEN-CODEINE #3 300-30 MG PO TABS
1.0000 | ORAL_TABLET | ORAL | Status: DC | PRN
  Administered 2017-05-27 – 2017-05-30 (×3): 1 via ORAL
  Administered 2017-05-31 – 2017-06-04 (×3): 2 via ORAL
  Administered 2017-06-05 (×2): 1 via ORAL
  Administered 2017-06-06 – 2017-06-11 (×4): 2 via ORAL
  Administered 2017-06-12: 1 via ORAL
  Administered 2017-06-13 (×2): 2 via ORAL
  Administered 2017-06-20: 1 via ORAL
  Administered 2017-06-20 – 2017-06-23 (×4): 2 via ORAL
  Administered 2017-06-23: 1 via ORAL
  Administered 2017-06-24 (×2): 2 via ORAL
  Administered 2017-06-25 – 2017-06-26 (×2): 1 via ORAL
  Administered 2017-06-26 – 2017-06-29 (×7): 2 via ORAL
  Administered 2017-06-29: 1 via ORAL
  Administered 2017-06-29 – 2017-07-03 (×6): 2 via ORAL
  Administered 2017-07-04: 1 via ORAL
  Administered 2017-07-04 – 2017-07-06 (×2): 2 via ORAL
  Administered 2017-07-06: 1 via ORAL
  Administered 2017-07-07 – 2017-07-08 (×3): 2 via ORAL
  Administered 2017-07-09: 1 via ORAL
  Administered 2017-07-11 – 2017-07-12 (×3): 2 via ORAL
  Administered 2017-07-13: 1 via ORAL
  Administered 2017-07-13 – 2017-07-17 (×8): 2 via ORAL
  Administered 2017-07-19: 1 via ORAL
  Administered 2017-07-20: 2 via ORAL
  Administered 2017-07-20: 1 via ORAL
  Administered 2017-07-21 – 2017-07-22 (×2): 2 via ORAL
  Administered 2017-07-22: 1 via ORAL
  Administered 2017-07-23 – 2017-07-25 (×3): 2 via ORAL
  Filled 2017-05-13 (×8): qty 2
  Filled 2017-05-13: qty 1
  Filled 2017-05-13 (×9): qty 2
  Filled 2017-05-13: qty 1
  Filled 2017-05-13 (×3): qty 2
  Filled 2017-05-13: qty 1
  Filled 2017-05-13 (×2): qty 2
  Filled 2017-05-13: qty 1
  Filled 2017-05-13: qty 2
  Filled 2017-05-13 (×2): qty 1
  Filled 2017-05-13 (×4): qty 2
  Filled 2017-05-13 (×2): qty 1
  Filled 2017-05-13: qty 2
  Filled 2017-05-13: qty 1
  Filled 2017-05-13 (×4): qty 2
  Filled 2017-05-13 (×2): qty 1
  Filled 2017-05-13: qty 2
  Filled 2017-05-13: qty 1
  Filled 2017-05-13 (×13): qty 2
  Filled 2017-05-13: qty 1
  Filled 2017-05-13 (×3): qty 2
  Filled 2017-05-13: qty 1
  Filled 2017-05-13 (×5): qty 2
  Filled 2017-05-13: qty 1
  Filled 2017-05-13 (×2): qty 2
  Filled 2017-05-13 (×2): qty 1

## 2017-05-13 MED ORDER — SODIUM CHLORIDE 0.9 % IV SOLN
INTRAVENOUS | Status: DC
  Administered 2017-05-13: 07:00:00 via INTRAVENOUS

## 2017-05-13 MED ORDER — LABETALOL HCL 5 MG/ML IV SOLN
20.0000 mg | INTRAVENOUS | Status: AC | PRN
  Administered 2017-05-13 – 2017-05-24 (×10): 20 mg via INTRAVENOUS
  Filled 2017-05-13 (×10): qty 4

## 2017-05-13 MED ORDER — POTASSIUM CHLORIDE 10 MEQ/100ML IV SOLN
10.0000 meq | INTRAVENOUS | Status: AC
  Administered 2017-05-13 (×4): 10 meq via INTRAVENOUS
  Filled 2017-05-13 (×4): qty 100

## 2017-05-13 MED ORDER — LABETALOL HCL 5 MG/ML IV SOLN
20.0000 mg | Freq: Once | INTRAVENOUS | Status: AC
  Administered 2017-05-13: 20 mg via INTRAVENOUS
  Filled 2017-05-13: qty 4

## 2017-05-13 MED ORDER — SODIUM CHLORIDE 0.9 % IV SOLN
500.0000 mg | Freq: Two times a day (BID) | INTRAVENOUS | Status: DC
  Administered 2017-05-13 – 2017-05-27 (×26): 500 mg via INTRAVENOUS
  Filled 2017-05-13 (×16): qty 5
  Filled 2017-05-13: qty 525
  Filled 2017-05-13: qty 5
  Filled 2017-05-13 (×2): qty 525
  Filled 2017-05-13 (×3): qty 5
  Filled 2017-05-13: qty 525
  Filled 2017-05-13 (×4): qty 5
  Filled 2017-05-13: qty 525

## 2017-05-13 MED ORDER — ONDANSETRON HCL 4 MG/2ML IJ SOLN
4.0000 mg | Freq: Once | INTRAMUSCULAR | Status: AC
  Administered 2017-05-13: 4 mg via INTRAVENOUS

## 2017-05-13 MED ORDER — MUPIROCIN 2 % EX OINT
1.0000 "application " | TOPICAL_OINTMENT | Freq: Two times a day (BID) | CUTANEOUS | Status: AC
  Administered 2017-05-13 – 2017-05-17 (×10): 1 via NASAL
  Filled 2017-05-13: qty 22

## 2017-05-13 MED ORDER — FOLIC ACID 5 MG/ML IJ SOLN
1.0000 mg | Freq: Every day | INTRAMUSCULAR | Status: DC
  Administered 2017-05-13 – 2017-05-29 (×17): 1 mg via INTRAVENOUS
  Filled 2017-05-13 (×18): qty 0.2

## 2017-05-13 MED ORDER — LORAZEPAM 2 MG/ML IJ SOLN
1.0000 mg | INTRAMUSCULAR | Status: DC | PRN
  Administered 2017-05-16 (×2): 2 mg via INTRAVENOUS
  Filled 2017-05-13 (×2): qty 1

## 2017-05-13 MED ORDER — ACETAMINOPHEN 160 MG/5ML PO SOLN
650.0000 mg | ORAL | Status: DC | PRN
  Administered 2017-06-30 – 2017-07-07 (×2): 650 mg
  Filled 2017-05-13 (×2): qty 20.3

## 2017-05-13 MED ORDER — ONDANSETRON HCL 4 MG/2ML IJ SOLN
4.0000 mg | Freq: Once | INTRAMUSCULAR | Status: AC
  Administered 2017-05-13: 4 mg via INTRAVENOUS
  Filled 2017-05-13: qty 2

## 2017-05-13 MED ORDER — SODIUM CHLORIDE 0.9 % IV BOLUS (SEPSIS)
1000.0000 mL | Freq: Once | INTRAVENOUS | Status: AC
  Administered 2017-05-13: 1000 mL via INTRAVENOUS

## 2017-05-13 MED ORDER — POTASSIUM CHLORIDE 10 MEQ/100ML IV SOLN: 10.0000 meq | INTRAVENOUS | Status: AC

## 2017-05-13 MED ORDER — ONDANSETRON HCL 4 MG/2ML IJ SOLN
INTRAMUSCULAR | Status: AC
  Filled 2017-05-13: qty 2

## 2017-05-13 MED ORDER — POTASSIUM CHLORIDE IN NACL 20-0.9 MEQ/L-% IV SOLN
INTRAVENOUS | Status: DC
  Administered 2017-05-13 – 2017-05-15 (×4): via INTRAVENOUS
  Filled 2017-05-13 (×6): qty 1000

## 2017-05-13 MED ORDER — MORPHINE SULFATE (PF) 4 MG/ML IV SOLN: 1.0000 mg | INTRAVENOUS | Status: DC | PRN

## 2017-05-13 MED ORDER — INSULIN ASPART 100 UNIT/ML ~~LOC~~ SOLN
0.0000 [IU] | SUBCUTANEOUS | Status: DC
  Administered 2017-05-13: 3 [IU] via SUBCUTANEOUS
  Administered 2017-05-13: 2 [IU] via SUBCUTANEOUS
  Administered 2017-05-13: 5 [IU] via SUBCUTANEOUS
  Administered 2017-05-14 (×4): 2 [IU] via SUBCUTANEOUS
  Administered 2017-05-15: 3 [IU] via SUBCUTANEOUS
  Administered 2017-05-15 (×3): 2 [IU] via SUBCUTANEOUS
  Administered 2017-05-15: 5 [IU] via SUBCUTANEOUS
  Administered 2017-05-15 – 2017-05-16 (×2): 2 [IU] via SUBCUTANEOUS
  Administered 2017-05-16: 5 [IU] via SUBCUTANEOUS
  Administered 2017-05-16 – 2017-05-17 (×6): 2 [IU] via SUBCUTANEOUS
  Administered 2017-05-18: 3 [IU] via SUBCUTANEOUS
  Administered 2017-05-18 – 2017-05-22 (×8): 2 [IU] via SUBCUTANEOUS
  Administered 2017-05-22: 3 [IU] via SUBCUTANEOUS
  Administered 2017-05-23: 2 [IU] via SUBCUTANEOUS
  Administered 2017-05-23: 3 [IU] via SUBCUTANEOUS
  Administered 2017-05-24 – 2017-05-29 (×7): 2 [IU] via SUBCUTANEOUS

## 2017-05-13 MED ORDER — DEXMEDETOMIDINE HCL IN NACL 200 MCG/50ML IV SOLN
0.2000 ug/kg/h | INTRAVENOUS | Status: DC
  Administered 2017-05-13 (×3): 0.2 ug/kg/h via INTRAVENOUS
  Administered 2017-05-14: 0.5 ug/kg/h via INTRAVENOUS
  Administered 2017-05-14 (×2): 0.3 ug/kg/h via INTRAVENOUS
  Administered 2017-05-14: 0.5 ug/kg/h via INTRAVENOUS
  Administered 2017-05-15: 0.7 ug/kg/h via INTRAVENOUS
  Administered 2017-05-15: 0.5 ug/kg/h via INTRAVENOUS
  Administered 2017-05-15: 0.7 ug/kg/h via INTRAVENOUS
  Administered 2017-05-15: 0.5 ug/kg/h via INTRAVENOUS
  Administered 2017-05-15 – 2017-05-16 (×4): 0.7 ug/kg/h via INTRAVENOUS
  Administered 2017-05-16: 0.3 ug/kg/h via INTRAVENOUS
  Administered 2017-05-16: 0.5 ug/kg/h via INTRAVENOUS
  Administered 2017-05-17 (×3): 0.3 ug/kg/h via INTRAVENOUS
  Administered 2017-05-17 (×2): 0.6 ug/kg/h via INTRAVENOUS
  Administered 2017-05-18: 0.3 ug/kg/h via INTRAVENOUS
  Filled 2017-05-13 (×4): qty 50
  Filled 2017-05-13: qty 100
  Filled 2017-05-13 (×6): qty 50
  Filled 2017-05-13: qty 100
  Filled 2017-05-13 (×10): qty 50

## 2017-05-13 MED ORDER — SODIUM CHLORIDE 0.9 % IV SOLN
500.0000 mg | Freq: Once | INTRAVENOUS | Status: DC
  Filled 2017-05-13: qty 5

## 2017-05-13 MED ORDER — ORAL CARE MOUTH RINSE
15.0000 mL | Freq: Two times a day (BID) | OROMUCOSAL | Status: DC
  Administered 2017-05-13 – 2017-07-25 (×121): 15 mL via OROMUCOSAL

## 2017-05-13 MED ORDER — LORAZEPAM 2 MG/ML IJ SOLN
1.0000 mg | Freq: Once | INTRAMUSCULAR | Status: AC
  Administered 2017-05-13: 1 mg via INTRAVENOUS
  Filled 2017-05-13: qty 1

## 2017-05-13 MED ORDER — SENNOSIDES-DOCUSATE SODIUM 8.6-50 MG PO TABS
1.0000 | ORAL_TABLET | Freq: Two times a day (BID) | ORAL | Status: DC
Start: 2017-05-13 — End: 2017-07-27
  Administered 2017-05-19 – 2017-07-26 (×91): 1 via ORAL
  Filled 2017-05-13 (×99): qty 1

## 2017-05-13 MED ORDER — CHLORHEXIDINE GLUCONATE CLOTH 2 % EX PADS
6.0000 | MEDICATED_PAD | Freq: Every day | CUTANEOUS | Status: AC
  Administered 2017-05-13 – 2017-05-17 (×5): 6 via TOPICAL

## 2017-05-13 MED ORDER — NICARDIPINE HCL IN NACL 20-0.86 MG/200ML-% IV SOLN
0.0000 mg/h | INTRAVENOUS | Status: DC
  Administered 2017-05-13: 5 mg/h via INTRAVENOUS
  Administered 2017-05-13: 2 mg/h via INTRAVENOUS
  Filled 2017-05-13 (×2): qty 200

## 2017-05-13 MED ORDER — ACETAMINOPHEN 650 MG RE SUPP
650.0000 mg | RECTAL | Status: DC | PRN
  Administered 2017-05-13: 650 mg via RECTAL
  Filled 2017-05-13: qty 1

## 2017-05-13 MED ORDER — PANTOPRAZOLE SODIUM 40 MG IV SOLR
40.0000 mg | Freq: Every day | INTRAVENOUS | Status: DC
Start: 2017-05-13 — End: 2017-05-29
  Administered 2017-05-13 – 2017-05-28 (×15): 40 mg via INTRAVENOUS
  Filled 2017-05-13 (×15): qty 40

## 2017-05-13 MED ORDER — THIAMINE HCL 100 MG/ML IJ SOLN
100.0000 mg | Freq: Every day | INTRAMUSCULAR | Status: DC
  Administered 2017-05-13 – 2017-05-29 (×17): 100 mg via INTRAVENOUS
  Filled 2017-05-13 (×17): qty 2

## 2017-05-13 NOTE — ED Provider Notes (Signed)
Patient resting comfortably He responded to ativan He is sleeping but arousable voice and follows commands Will be transferred to ICU    Zadie RhineWickline, Azriella Mattia, MD 05/13/17 0745
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
## 2017-05-13 NOTE — ED Notes (Signed)
Patient assisted to changed to a gown , complete linen change , repositioned on bed .

## 2017-05-13 NOTE — ED Notes (Signed)
Returned from CT scan , unable to completed scan due to pt.'s motion / restless during procedure , EDP notified .
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
## 2017-05-13 NOTE — Consult Note (Signed)
PULMONARY / CRITICAL CARE MEDICINE   Name: Goble Fudala MRN: [id number] DOB: 06-25-1965    ADMISSION DATE:  05/13/2017 CONSULTATION DATE:  05/13/17  REFERRING MD:  Venetia Maxon  CHIEF COMPLAINT:  ICH  HISTORY OF PRESENT ILLNESS:  Pt is encephelopathic; therefore, this HPI is obtained from chart review. Aydon Swamy is a 52 y.o. male with PMH as outlined below including EtOH abuse.  He presented to [facility] ED 2/22 with a head injury.  Came to ED night prior with EMS due to AMS and was found to have EtOH level of 277.  He did not appear to have any acute injuries and after monitoring in ED for several hours, was discharged home. Returned to ED early AM 2/22 and appeared to have head injury.  Had CT that demonstrated acute depressed right frontotemoporal skull fx, right frontotemporal hemorrhagic contusions, small left frontal lobe hemorrhagic contusion, right uncal herniation with 6mm R to L ventricular entratpment, right lateral intraventricular hemorrhage, small volume SAH, small acute bilateral SDH's. Left humerus xray demonstrated probable small cortical avulsion fx.  He was admitted by neurosurgery and PCCM was asked to see in consultation.  PAST MEDICAL HISTORY :  He  has a past medical history of Abdominal aneurysm (HCC), Chronic kidney disease, stage III (moderate) (HCC), Hypertension, Obesity (BMI 30-39.9), Other and unspecified hyperlipidemia, and Type II or unspecified type diabetes mellitus without mention of complication, not stated as uncontrolled.  PAST SURGICAL HISTORY: He  has a past surgical history that includes stomach vessel aneurysm.  No Known Allergies  No current facility-administered medications on file prior to encounter.    No current outpatient medications on file prior to encounter.    FAMILY HISTORY:  His indicated that the status of his mother is unknown.   SOCIAL HISTORY: He  reports that he is a non-smoker but has been exposed to tobacco smoke. he has  never used smokeless tobacco. He reports that he drinks about 7.2 oz of alcohol per week. He reports that he does not use drugs.  REVIEW OF SYSTEMS:  Unable to obtain as pt is encephalopathic.  SUBJECTIVE:  Awake but does not follow any commands.  VITAL SIGNS: BP (!) 198/109   Pulse (!) 131   Temp 98.5 F (36.9 C)   Resp 19   SpO2 100%   HEMODYNAMICS:    VENTILATOR SETTINGS:    INTAKE / OUTPUT: No intake/output data recorded.   PHYSICAL EXAMINATION: General: Adult male, in NAD. Neuro: Appears intoxicated.  Opens eyes but does not follow commands. HEENT: Abrasion to left frontal and lateral scalp.  Dried blood in oral mucosa.  Cardiovascular: RRR, no M/R/G.  Lungs: Respirations even and unlabored.  CTA bilaterally, No W/R/R.  Abdomen: BS x 4, soft, NT/ND.  Musculoskeletal: No gross deformities, no edema.  Skin: Intact, warm, no rashes.  LABS:  BMET Recent Labs  Lab 05/08/17 1641 05/12/17 2013 05/13/17 0223  NA 139 139 137  K 3.6 3.5 3.0*  CL 104 100* 104  CO2 24  --  20*  BUN <5* 5* <5*  CREATININE 0.67 1.00 0.67  GLUCOSE 210* 173* 184*    Electrolytes Recent Labs  Lab 05/08/17 1641 05/13/17 0223  CALCIUM 8.2* 7.7*    CBC Recent Labs  Lab 05/08/17 1641 05/12/17 1921 05/12/17 2013 05/13/17 0223  WBC 6.3 6.9  --  9.0  HGB 10.9* 9.2* 9.2* 10.2*  HCT 31.5* 27.0* 27.0* 29.6*  PLT 155 133*  --  123*    Coag's Recent  Labs  Lab 05/13/17 0501  APTT 34  INR 1.23    Sepsis Markers No results for input(s): LATICACIDVEN, PROCALCITON, O2SATVEN in the last 168 hours.  ABG No results for input(s): PHART, PCO2ART, PO2ART in the last 168 hours.  Liver Enzymes Recent Labs  Lab 05/08/17 1641 05/13/17 0223  AST 72* 91*  ALT 30 31  ALKPHOS 223* 224*  BILITOT 1.5* 2.0*  ALBUMIN 2.7* 2.6*    Cardiac Enzymes No results for input(s): TROPONINI, PROBNP in the last 168 hours.  Glucose Recent Labs  Lab 05/12/17 1927 05/13/17 0033  GLUCAP  170* 149*    Imaging Dg Forearm Left  Result Date: 05/13/2017 CLINICAL DATA:  52 year old male with left upper extremity trauma. EXAM: LEFT HUMERUS - 2+ VIEW; LEFT FOREARM - 2 VIEW COMPARISON:  Left elbow radiograph dated 03/02/2017 FINDINGS: Faint small bony fragments along the posterior aspect of the olecranon process appear new compared to prior radiograph and may represent cortical fractures. No other acute fracture noted. There is no dislocation there is soft tissue swelling over the olecranon versus fluid within the bursa. Clinical correlation is recommended. IMPRESSION: Probable small cortical avulsion fractures from the dorsal aspect of the olecranon, possibly at the insertion of the triceps tendon. No other acute fracture. Electronically Signed   By: Elgie Collard M.D.   On: 05/13/2017 05:19   Ct Head Wo Contrast  Result Date: 05/13/2017 CLINICAL DATA:  Found down 2 hours after discharged from hospital. LEFT head hematoma. Altered mental status. History of diabetes, hypertension, alcohol abuse, intracranial hemorrhage. EXAM: CT HEAD WITHOUT CONTRAST CT CERVICAL SPINE WITHOUT CONTRAST TECHNIQUE: Multidetector CT imaging of the head and cervical spine was performed following the standard protocol without intravenous contrast. Multiplanar CT image reconstructions of the cervical spine were also generated. COMPARISON:  CT HEAD and cervical spine April 29, 2017 FINDINGS: CT HEAD FINDINGS-mildly motion degraded examination. BRAIN: Confluent RIGHT frontotemporal hemorrhagic contusions measuring to 9.7 x 4.8 cm. Smaller LEFT frontal lobe hemorrhagic contusion. Small volume frontoparietal subarachnoid hemorrhage. Bilateral holohemispheric subdural hematomas measuring to 3 mm. 5 mm falcotentorial subdural hematoma extending to RIGHT greater than LEFT tentorium. RIGHT lateral intraventricular hemorrhage. Mildly effaced RIGHT lateral ventricle with 6 mm RIGHT to LEFT midline shift. No LEFT ventricle  entrapment. Moderate global parenchymal brain volume loss. No hydrocephalus. RIGHT uncal herniation. Basal cisterns patent. VASCULAR: Mild calcific atherosclerosis. SKULL/SOFT TISSUES: Acute RIGHT frontotemporal skull fracture, 10 mm central depression. Moderate RIGHT and large LEFT posterior scalp hematoma. RIGHT face ORIF. ORBITS/SINUSES: RIGHT globe prosthesis.The mastoid aircells and included paranasal sinuses are well-aerated. OTHER: None. CT CERVICAL SPINE FINDINGS-severely motion degraded examination. ALIGNMENT: Maintained reversed cervical lordosis. Limited assessment for malalignment due to motion. SKULL BASE AND VERTEBRAE: Vertebral body heights generally preserved. Multilevel severe degenerative disc. C1-2 articulation maintained. SOFT TISSUES AND SPINAL CANAL: Non suspicious though limited assessment. DISC LEVELS:  Nondiagnostic evaluation. UPPER CHEST: Clear. OTHER: None. IMPRESSION: CT HEAD: 1. Motion degraded examination. 2. Acute depressed RIGHT frontotemporal skull fracture. RIGHT frontotemporal confluent large acute hemorrhagic contusions. Small LEFT frontal lobe hemorrhagic contusion. 3. RIGHT uncal herniation and 6 mm RIGHT to LEFT ventricular entrapment. RIGHT lateral intraventricular hemorrhage. No LEFT ventricle entrapment. 4. Small volume acute subarachnoid hemorrhage. Small acute bilateral holohemispheric subdural hematomas and 5 mm acute focal tentorial subdural hematoma. CT CERVICAL SPINE: 1. Severely motion degraded examination. No definite fracture. Nondiagnostic assessment of malalignment. Critical Value/emergent results were called by telephone at the time of interpretation on 05/13/2017 at 4:18 am to Dr. Dorinda Hill  [facility] , who verbally acknowledged these results. Electronically Signed   By: Awilda Metro M.D.   On: 05/13/2017 04:25   Ct Cervical Spine Wo Contrast  Result Date: 05/13/2017 CLINICAL DATA:  Found down 2 hours after discharged from hospital. LEFT head hematoma.  Altered mental status. History of diabetes, hypertension, alcohol abuse, intracranial hemorrhage. EXAM: CT HEAD WITHOUT CONTRAST CT CERVICAL SPINE WITHOUT CONTRAST TECHNIQUE: Multidetector CT imaging of the head and cervical spine was performed following the standard protocol without intravenous contrast. Multiplanar CT image reconstructions of the cervical spine were also generated. COMPARISON:  CT HEAD and cervical spine April 29, 2017 FINDINGS: CT HEAD FINDINGS-mildly motion degraded examination. BRAIN: Confluent RIGHT frontotemporal hemorrhagic contusions measuring to 9.7 x 4.8 cm. Smaller LEFT frontal lobe hemorrhagic contusion. Small volume frontoparietal subarachnoid hemorrhage. Bilateral holohemispheric subdural hematomas measuring to 3 mm. 5 mm falcotentorial subdural hematoma extending to RIGHT greater than LEFT tentorium. RIGHT lateral intraventricular hemorrhage. Mildly effaced RIGHT lateral ventricle with 6 mm RIGHT to LEFT midline shift. No LEFT ventricle entrapment. Moderate global parenchymal brain volume loss. No hydrocephalus. RIGHT uncal herniation. Basal cisterns patent. VASCULAR: Mild calcific atherosclerosis. SKULL/SOFT TISSUES: Acute RIGHT frontotemporal skull fracture, 10 mm central depression. Moderate RIGHT and large LEFT posterior scalp hematoma. RIGHT face ORIF. ORBITS/SINUSES: RIGHT globe prosthesis.The mastoid aircells and included paranasal sinuses are well-aerated. OTHER: None. CT CERVICAL SPINE FINDINGS-severely motion degraded examination. ALIGNMENT: Maintained reversed cervical lordosis. Limited assessment for malalignment due to motion. SKULL BASE AND VERTEBRAE: Vertebral body heights generally preserved. Multilevel severe degenerative disc. C1-2 articulation maintained. SOFT TISSUES AND SPINAL CANAL: Non suspicious though limited assessment. DISC LEVELS:  Nondiagnostic evaluation. UPPER CHEST: Clear. OTHER: None. IMPRESSION: CT HEAD: 1. Motion degraded examination. 2. Acute  depressed RIGHT frontotemporal skull fracture. RIGHT frontotemporal confluent large acute hemorrhagic contusions. Small LEFT frontal lobe hemorrhagic contusion. 3. RIGHT uncal herniation and 6 mm RIGHT to LEFT ventricular entrapment. RIGHT lateral intraventricular hemorrhage. No LEFT ventricle entrapment. 4. Small volume acute subarachnoid hemorrhage. Small acute bilateral holohemispheric subdural hematomas and 5 mm acute focal tentorial subdural hematoma. CT CERVICAL SPINE: 1. Severely motion degraded examination. No definite fracture. Nondiagnostic assessment of malalignment. Critical Value/emergent results were called by telephone at the time of interpretation on 05/13/2017 at 4:18 am to Dr. Zadie Rhine , who verbally acknowledged these results. Electronically Signed   By: Awilda Metro M.D.   On: 05/13/2017 04:25   Dg Chest Port 1 View  Result Date: 05/13/2017 CLINICAL DATA:  52 year old male with weakness. EXAM: PORTABLE CHEST 1 VIEW COMPARISON:  Chest radiograph dated 04/22/2017 FINDINGS: There are bibasilar streaky densities, likely atelectatic changes. No focal consolidation, pleural effusion, or pneumothorax. The cardiac silhouette is within normal limits. No acute osseous pathology. IMPRESSION: Probable bibasilar atelectasis.  No focal consolidation Electronically Signed   By: Elgie Collard M.D.   On: 05/13/2017 02:52   Dg Humerus Left  Result Date: 05/13/2017 CLINICAL DATA:  52 year old male with left upper extremity trauma. EXAM: LEFT HUMERUS - 2+ VIEW; LEFT FOREARM - 2 VIEW COMPARISON:  Left elbow radiograph dated 03/02/2017 FINDINGS: Faint small bony fragments along the posterior aspect of the olecranon process appear new compared to prior radiograph and may represent cortical fractures. No other acute fracture noted. There is no dislocation there is soft tissue swelling over the olecranon versus fluid within the bursa. Clinical correlation is recommended. IMPRESSION: Probable small  cortical avulsion fractures from the dorsal aspect of the olecranon, possibly at the insertion of the triceps tendon. No  other acute fracture. Electronically Signed   By: Elgie CollardArash  Radparvar M.D.   On: 05/13/2017 05:19     STUDIES:  CT head 2/22 > depressed right frontotemoporal skull fx, right frontotemporal hemorrhagic contusions, small left frontal lobe hemorrhagic contusion, right uncal herniation with 6mm R to L ventricular entratpment, right lateral intraventricular hemorrhage, small volume SAH, small acute bilateral SDH's. Left humerus xray 2/22 > probable small cortical avulsion fx.  CULTURES: None.  ANTIBIOTICS: None.  SIGNIFICANT EVENTS: 2/22 > admit.  LINES/TUBES: None.  DISCUSSION: 52 y.o. male with EtOH admitted 2/22 with multiple head injuries, unknown mechanism of injury.  Being admitted by neurosurgery and PCCM asked to assist with medical management.  ASSESSMENT / PLAN:  PULMONARY A: At risk for compromised airway protection. P:   Monitor closely.  CARDIOVASCULAR A:  Hx HTN. P:  Labetalol PRN.  RENAL A:   Hypokalemia. P:   NS @ 75. K x 4 runs. BMP in AM.  GASTROINTESTINAL A:   Nutrition. P:   NPO.  HEMATOLOGIC A:   Anemia. Thrombocytopenia - likely due to EtOH. VTE Prophylaxis. P:  Transfuse for Hgb < 7. Monitor platelet counts. SCD's. CBC in AM.  INFECTIOUS A:   No indication of infection. P:   Monitor clinically.  ENDOCRINE A:   DM. P:   SSI.  NEUROLOGIC A:   Multiple head injuries - CT demonstrates depressed right frontotemoporal skull fx, right frontotemporal hemorrhagic contusions, small left frontal lobe hemorrhagic contusion, right uncal herniation with 6mm R to L ventricular entratpment, right lateral intraventricular hemorrhage, small volume SAH, small acute bilateral SDH's. Hx EtOH abuse. P:   Neurosurgery managing. Thiamine / Folate. Precedex gtt with Ativan PRN. EtOH cessation.  Family updated: None  available.  Interdisciplinary Family Meeting v Palliative Care Meeting:  Due by: 05/19/17.  CC time: 30 min.   Rutherford Guysahul Taylynn Easton, [place] - [facility] Pager: [phone]  or [phone] 05/13/2017, 6:30 AM

## 2017-05-13 NOTE — ED Notes (Addendum)
Pt back to room from CT, where pt ripped off condom cath. Pt has vomited and urinated on himself. Pt is still disoriented at this time.

## 2017-05-13 NOTE — ED Notes (Signed)
Patient placed on a monitor and pulse oximetry , NS IV bolus infusing /IV site intact , patient remained somnolent/lethargic , will briefly open his eyes and then sleep,respirations unlabored , O2 sat= 96% room air.

## 2017-05-13 NOTE — ED Notes (Signed)
Attempted to call report

## 2017-05-13 NOTE — ED Notes (Signed)
Patient transported to CT 

## 2017-05-13 NOTE — ED Notes (Signed)
Unable to complete assesment , pt. refuse to cooperate with nurse .

## 2017-05-13 NOTE — ED Notes (Signed)
Left arm sling applied.

## 2017-05-13 NOTE — ED Notes (Signed)
C-collar applied

## 2017-05-13 NOTE — Progress Notes (Signed)
PT Cancellation Note  Patient Details Name: Carlyn ReichertManuel Heyer MRN: [id number] DOB: [birth year]   Cancelled Treatment:    Reason Eval/Treat Not Completed: Active bedrest order Noted active bedrest order, also per MD note patient being admitted to ICU for further care and management, agitated and tachycardic. Possible traumatic cause of injury. PT to hold for now, plan to return as able when patient is medically appropriate/bed rest order is cleared.    Nedra HaiKristen Unger PT, DPT, CBIS  Supplemental Physical Therapist [facility]   Pager [phone]

## 2017-05-13 NOTE — Progress Notes (Signed)
SLP Cancellation Note  Patient Details Name: [name] MRN: [id number] DOB: [birth year]   Cancelled treatment:       Reason Eval/Treat Not Completed: Patient not medically ready. Just admitted from ED. Not alert/stable for therapy interventions. Will check back over weekend.    Leslie Langille, Riley NearingBonnie Caroline 05/13/2017, 10:08 AM

## 2017-05-13 NOTE — ED Provider Notes (Signed)
[facility] EMERGENCY DEPARTMENT Provider Note   CSN: [id number] Arrival date & time: 05/13/17  0019     History   Chief Complaint Chief Complaint  Patient presents with  . Alcohol Intoxication   Level 5 caveat due to altered mental status HPI [name] is a 52 y.o. male.  The history is provided by the patient.  Alcohol Intoxication  This is a new problem. Episode onset: unknown. The problem occurs constantly. The problem has been gradually worsening. Nothing aggravates the symptoms. Nothing relieves the symptoms.   Patient with history of AAA, chronic kidney disease, hypertension, alcoholism presents with presumed alcohol abuse and vomiting.  Patient is unable to provide much details.  Patient keeps reporting "help me " He does have evidence of head injury.  No other details are known at this time Past Medical History:  Diagnosis Date  . Abdominal aneurysm [facility])    Patient reported   . Chronic kidney disease, stage III (moderate) (HCC)    Deterding  . Hypertension   . Obesity (BMI 30-39.9)   . Other and unspecified hyperlipidemia   . Type II or unspecified type diabetes mellitus without mention of complication, not stated as uncontrolled     Patient Active Problem List   Diagnosis Date Noted  . Tachycardia   . Alcohol withdrawal (HCC) 04/03/2017  . DM2 (diabetes mellitus, type 2) (HCC) 04/03/2017  . Alcohol abuse with alcohol-induced mood disorder (HCC) 03/24/2017  . Frontal lobe contusion (HCC) 01/23/2017  . Encephalopathy acute   . Fall   . Subarachnoid hemorrhage following injury with brief loss of consciousness but without open intracranial wound (HCC) 07/17/2016  . Traumatic subarachnoid hemorrhage (HCC) 01/15/2015  . Alcohol intoxication (HCC) 01/15/2015  . SAH (subarachnoid hemorrhage) (HCC) 01/15/2015    Past Surgical History:  Procedure Laterality Date  . stomach vessel aneurysm     Patient reported        Home  Medications    Prior to Admission medications   Not on File    Family History Family History  Problem Relation Age of Onset  . Diabetes Mellitus II Mother     Social History Social History   Tobacco Use  . Smoking status: Passive Smoke Exposure - Never Smoker  . Smokeless tobacco: Never Used  Substance Use Topics  . Alcohol use: Yes    Alcohol/week: 7.2 oz    Types: 12 Cans of beer per week  . Drug use: No     Allergies   Patient has no known allergies.   Review of Systems Review of Systems  Unable to perform ROS: Mental status change     Physical Exam Updated Vital Signs BP (!) 148/83 (BP Location: Right Arm)   Pulse (!) 138   Temp 98.5 F (36.9 C) (Oral)   Resp 20   SpO2 100%   Physical Exam CONSTITUTIONAL: Disheveled, smells of vomit and alcohol, appears very restless HEAD: Bruising noted to right temporal region, dried blood noted EYES: Prosthetic right eye, left eye EOMI  ENMT: Mucous membranes moist, vomit noted on mouth NECK: supple no meningeal signs SPINE/BACK:entire spine nontender, No bruising/crepitance/stepoffs noted to spine CV: S1/S2 noted, no murmurs/rubs/gallops noted, tachycardic LUNGS: Coarse breath sounds bilaterally ABDOMEN: soft, nontender NEURO: Pt is awake/alert, he appears restless,  he appears confused EXTREMITIES: pulses normal/equal, full ROM, pelvis stable SKIN: warm, color normal PSYCH: Anxious  ED Treatments / Results  Labs (all labs ordered are listed, but only abnormal results are displayed) Labs  Reviewed  CBC WITH DIFFERENTIAL/PLATELET - Abnormal; Notable for the following components:      Result Value   RBC 2.99 (*)    Hemoglobin 10.2 (*)    HCT 29.6 (*)    MCH 34.1 (*)    Platelets 123 (*)    All other components within normal limits  COMPREHENSIVE METABOLIC PANEL - Abnormal; Notable for the following components:   Potassium 3.0 (*)    CO2 20 (*)    Glucose, Bld 184 (*)    BUN <5 (*)    Calcium 7.7 (*)      Albumin 2.6 (*)    AST 91 (*)    Alkaline Phosphatase 224 (*)    Total Bilirubin 2.0 (*)    All other components within normal limits  ETHANOL - Abnormal; Notable for the following components:   Alcohol, Ethyl (B) 68 (*)    All other components within normal limits  ACETAMINOPHEN LEVEL - Abnormal; Notable for the following components:   Acetaminophen (Tylenol), Serum <10 (*)    All other components within normal limits  PROTIME-INR - Abnormal; Notable for the following components:   Prothrombin Time 15.4 (*)    All other components within normal limits  CBG MONITORING, ED - Abnormal; Notable for the following components:   Glucose-Capillary 149 (*)    All other components within normal limits  SALICYLATE LEVEL  APTT  CBG MONITORING, ED  TYPE AND SCREEN    EKG  EKG Interpretation  Date/Time:  Friday May 13 2017 02:37:53 EST Ventricular Rate:  109 PR Interval:    QRS Duration: 95 QT Interval:  361 QTC Calculation: 487 R Axis:   27 Text Interpretation:  Sinus tachycardia Atrial premature complex Anterior infarct, old Borderline T abnormalities, inferior leads No significant change since last tracing Confirmed by Zadie Rhine ([id number]) on 05/13/2017 2:40:53 AM       Radiology Dg Forearm Left  Result Date: 05/13/2017 CLINICAL DATA:  52 year old male with left upper extremity trauma. EXAM: LEFT HUMERUS - 2+ VIEW; LEFT FOREARM - 2 VIEW COMPARISON:  Left elbow radiograph dated 03/02/2017 FINDINGS: Faint small bony fragments along the posterior aspect of the olecranon process appear new compared to prior radiograph and may represent cortical fractures. No other acute fracture noted. There is no dislocation there is soft tissue swelling over the olecranon versus fluid within the bursa. Clinical correlation is recommended. IMPRESSION: Probable small cortical avulsion fractures from the dorsal aspect of the olecranon, possibly at the insertion of the triceps tendon. No other  acute fracture. Electronically Signed   By: Elgie Collard M.D.   On: 05/13/2017 05:19   Ct Head Wo Contrast  Result Date: 05/13/2017 CLINICAL DATA:  Found down 2 hours after discharged from hospital. LEFT head hematoma. Altered mental status. History of diabetes, hypertension, alcohol abuse, intracranial hemorrhage. EXAM: CT HEAD WITHOUT CONTRAST CT CERVICAL SPINE WITHOUT CONTRAST TECHNIQUE: Multidetector CT imaging of the head and cervical spine was performed following the standard protocol without intravenous contrast. Multiplanar CT image reconstructions of the cervical spine were also generated. COMPARISON:  CT HEAD and cervical spine April 29, 2017 FINDINGS: CT HEAD FINDINGS-mildly motion degraded examination. BRAIN: Confluent RIGHT frontotemporal hemorrhagic contusions measuring to 9.7 x 4.8 cm. Smaller LEFT frontal lobe hemorrhagic contusion. Small volume frontoparietal subarachnoid hemorrhage. Bilateral holohemispheric subdural hematomas measuring to 3 mm. 5 mm falcotentorial subdural hematoma extending to RIGHT greater than LEFT tentorium. RIGHT lateral intraventricular hemorrhage. Mildly effaced RIGHT lateral ventricle with 6 mm RIGHT to  LEFT midline shift. No LEFT ventricle entrapment. Moderate global parenchymal brain volume loss. No hydrocephalus. RIGHT uncal herniation. Basal cisterns patent. VASCULAR: Mild calcific atherosclerosis. SKULL/SOFT TISSUES: Acute RIGHT frontotemporal skull fracture, 10 mm central depression. Moderate RIGHT and large LEFT posterior scalp hematoma. RIGHT face ORIF. ORBITS/SINUSES: RIGHT globe prosthesis.The mastoid aircells and included paranasal sinuses are well-aerated. OTHER: None. CT CERVICAL SPINE FINDINGS-severely motion degraded examination. ALIGNMENT: Maintained reversed cervical lordosis. Limited assessment for malalignment due to motion. SKULL BASE AND VERTEBRAE: Vertebral body heights generally preserved. Multilevel severe degenerative disc. C1-2  articulation maintained. SOFT TISSUES AND SPINAL CANAL: Non suspicious though limited assessment. DISC LEVELS:  Nondiagnostic evaluation. UPPER CHEST: Clear. OTHER: None. IMPRESSION: CT HEAD: 1. Motion degraded examination. 2. Acute depressed RIGHT frontotemporal skull fracture. RIGHT frontotemporal confluent large acute hemorrhagic contusions. Small LEFT frontal lobe hemorrhagic contusion. 3. RIGHT uncal herniation and 6 mm RIGHT to LEFT ventricular entrapment. RIGHT lateral intraventricular hemorrhage. No LEFT ventricle entrapment. 4. Small volume acute subarachnoid hemorrhage. Small acute bilateral holohemispheric subdural hematomas and 5 mm acute focal tentorial subdural hematoma. CT CERVICAL SPINE: 1. Severely motion degraded examination. No definite fracture. Nondiagnostic assessment of malalignment. Critical Value/emergent results were called by telephone at the time of interpretation on 05/13/2017 at 4:18 am to Dr. Zadie RhineNALD Kishan Wachsmuth , who verbally acknowledged these results. Electronically Signed   By: Awilda Metroourtnay  Bloomer M.D.   On: 05/13/2017 04:25   Ct Cervical Spine Wo Contrast  Result Date: 05/13/2017 CLINICAL DATA:  Found down 2 hours after discharged from hospital. LEFT head hematoma. Altered mental status. History of diabetes, hypertension, alcohol abuse, intracranial hemorrhage. EXAM: CT HEAD WITHOUT CONTRAST CT CERVICAL SPINE WITHOUT CONTRAST TECHNIQUE: Multidetector CT imaging of the head and cervical spine was performed following the standard protocol without intravenous contrast. Multiplanar CT image reconstructions of the cervical spine were also generated. COMPARISON:  CT HEAD and cervical spine April 29, 2017 FINDINGS: CT HEAD FINDINGS-mildly motion degraded examination. BRAIN: Confluent RIGHT frontotemporal hemorrhagic contusions measuring to 9.7 x 4.8 cm. Smaller LEFT frontal lobe hemorrhagic contusion. Small volume frontoparietal subarachnoid hemorrhage. Bilateral holohemispheric subdural  hematomas measuring to 3 mm. 5 mm falcotentorial subdural hematoma extending to RIGHT greater than LEFT tentorium. RIGHT lateral intraventricular hemorrhage. Mildly effaced RIGHT lateral ventricle with 6 mm RIGHT to LEFT midline shift. No LEFT ventricle entrapment. Moderate global parenchymal brain volume loss. No hydrocephalus. RIGHT uncal herniation. Basal cisterns patent. VASCULAR: Mild calcific atherosclerosis. SKULL/SOFT TISSUES: Acute RIGHT frontotemporal skull fracture, 10 mm central depression. Moderate RIGHT and large LEFT posterior scalp hematoma. RIGHT face ORIF. ORBITS/SINUSES: RIGHT globe prosthesis.The mastoid aircells and included paranasal sinuses are well-aerated. OTHER: None. CT CERVICAL SPINE FINDINGS-severely motion degraded examination. ALIGNMENT: Maintained reversed cervical lordosis. Limited assessment for malalignment due to motion. SKULL BASE AND VERTEBRAE: Vertebral body heights generally preserved. Multilevel severe degenerative disc. C1-2 articulation maintained. SOFT TISSUES AND SPINAL CANAL: Non suspicious though limited assessment. DISC LEVELS:  Nondiagnostic evaluation. UPPER CHEST: Clear. OTHER: None. IMPRESSION: CT HEAD: 1. Motion degraded examination. 2. Acute depressed RIGHT frontotemporal skull fracture. RIGHT frontotemporal confluent large acute hemorrhagic contusions. Small LEFT frontal lobe hemorrhagic contusion. 3. RIGHT uncal herniation and 6 mm RIGHT to LEFT ventricular entrapment. RIGHT lateral intraventricular hemorrhage. No LEFT ventricle entrapment. 4. Small volume acute subarachnoid hemorrhage. Small acute bilateral holohemispheric subdural hematomas and 5 mm acute focal tentorial subdural hematoma. CT CERVICAL SPINE: 1. Severely motion degraded examination. No definite fracture. Nondiagnostic assessment of malalignment. Critical Value/emergent results were called by telephone at the time of  interpretation on 05/13/2017 at 4:18 am to Dr. Zadie Rhine , who verbally  acknowledged these results. Electronically Signed   By: Awilda Metro M.D.   On: 05/13/2017 04:25   Dg Chest Port 1 View  Result Date: 05/13/2017 CLINICAL DATA:  52 year old male with weakness. EXAM: PORTABLE CHEST 1 VIEW COMPARISON:  Chest radiograph dated 04/22/2017 FINDINGS: There are bibasilar streaky densities, likely atelectatic changes. No focal consolidation, pleural effusion, or pneumothorax. The cardiac silhouette is within normal limits. No acute osseous pathology. IMPRESSION: Probable bibasilar atelectasis.  No focal consolidation Electronically Signed   By: Elgie Collard M.D.   On: 05/13/2017 02:52   Dg Humerus Left  Result Date: 05/13/2017 CLINICAL DATA:  52 year old male with left upper extremity trauma. EXAM: LEFT HUMERUS - 2+ VIEW; LEFT FOREARM - 2 VIEW COMPARISON:  Left elbow radiograph dated 03/02/2017 FINDINGS: Faint small bony fragments along the posterior aspect of the olecranon process appear new compared to prior radiograph and may represent cortical fractures. No other acute fracture noted. There is no dislocation there is soft tissue swelling over the olecranon versus fluid within the bursa. Clinical correlation is recommended. IMPRESSION: Probable small cortical avulsion fractures from the dorsal aspect of the olecranon, possibly at the insertion of the triceps tendon. No other acute fracture. Electronically Signed   By: Elgie Collard M.D.   On: 05/13/2017 05:19    Procedures Procedures  CRITICAL CARE Performed by: Joya Gaskins Total critical care time: 45 minutes Critical care time was exclusive of separately billable procedures and treating other patients. Critical care was necessary to treat or prevent imminent or life-threatening deterioration. Critical care was time spent personally by me on the following activities: development of treatment plan with patient and/or surrogate as well as nursing, discussions with consultants, evaluation of patient's  response to treatment, examination of patient, obtaining history from patient or surrogate, ordering and performing treatments and interventions, ordering and review of laboratory studies, ordering and review of radiographic studies, pulse oximetry and re-evaluation of patient's condition. Patient with subdural hematoma, requires admission to ICU.  SPLINT APPLICATION Date/Time: 6:33 AM Authorized by: Joya Gaskins Consent: Verbal consent obtained. Risks and benefits: risks, benefits and alternatives were discussed Consent given by: patient Splint applied by: orthopedic technician Location details: left arm Splint type: sling Supplies used: sling Post-procedure: The splinted body part was neurovascularly unchanged following the procedure. Patient tolerance: Patient tolerated the procedure well with no immediate complications.   Medications Ordered in ED Medications  sodium chloride 0.9 % bolus 1,000 mL (0 mLs Intravenous Stopped 05/13/17 0355)  ondansetron (ZOFRAN) injection 4 mg (4 mg Intravenous Given 05/13/17 0252)  LORazepam (ATIVAN) injection 1 mg (1 mg Intravenous Given 05/13/17 0252)  ondansetron (ZOFRAN) injection 4 mg (4 mg Intravenous Given 05/13/17 0457)  LORazepam (ATIVAN) injection 2 mg (2 mg Intravenous Given 05/13/17 1610)     Initial Impression / Assessment and Plan / ED Course  I have reviewed the triage vital signs and the nursing notes.  Pertinent labs & imaging results that were available during my care of the patient were reviewed by me and considered in my medical decision making (see chart for details).    2:30 AM Patient with multiple ED visits in the past 6 months presents with presumed alcohol intoxication and head injury after falling On my initial exam, he is covered in vomit and smells of alcohol.  He has signs of head injury.  He appears confused.  Labs and CT imaging have been  ordered.  Will follow closely 4:25 AM Call from radiology -pt with skull  fracture and subdural hematoma Pt is resting comfortably GCS 14 - he wakes up and answers questions appropriately.  Now that is calm/less restless, he appears to have left facial droop and left UE/LE are flaccid  Consult neurosurgery placed His left arm is flaccid, and ?deformity, xray ordered Unable to clear cspine due to poor imaging quality, will keep in c-collar 4:46 AM D/w dr Venetia Maxon, nsgy He will see patient 5:06 AM Discussed with Dr. Venetia Maxon.  He has reviewed CT imaging.  He will admit patient to ICU. He does not request any further medications at this time. 6:34 AM Pt found to have small ?olcreanon fx, sling ordered Pt also tachycardic/anxious, suspect ETOH withdrawal Pt still awake/alert Seen in the ED by Dr Venetia Maxon for admission to ICU  Final Clinical Impressions(s) / ED Diagnoses   Final diagnoses:  Subdural hematoma (HCC)  Closed fracture of frontal bone, initial encounter (HCC)  Focal hemorrhagic contusion of cerebrum (HCC)  Alcohol withdrawal syndrome, with delirium (HCC)  Closed fracture of olecranon process of left ulna, initial encounter    ED Discharge Orders    None       Zadie Rhine, MD 05/13/17 [phone]

## 2017-05-14 ENCOUNTER — Inpatient Hospital Stay (HOSPITAL_COMMUNITY): Payer: Medicaid Other

## 2017-05-14 ENCOUNTER — Encounter (HOSPITAL_COMMUNITY): Payer: Self-pay | Admitting: *Deleted

## 2017-05-14 LAB — CBC WITH DIFFERENTIAL/PLATELET
Basophils Absolute: 0 10*3/uL (ref 0.0–0.1)
Basophils Relative: 0 %
EOS ABS: 0 10*3/uL (ref 0.0–0.7)
Eosinophils Relative: 0 %
HCT: 26.7 % — ABNORMAL LOW (ref 39.0–52.0)
HEMOGLOBIN: 8.8 g/dL — AB (ref 13.0–17.0)
LYMPHS ABS: 1 10*3/uL (ref 0.7–4.0)
LYMPHS PCT: 16 %
MCH: 33.5 pg (ref 26.0–34.0)
MCHC: 33 g/dL (ref 30.0–36.0)
MCV: 101.5 fL — ABNORMAL HIGH (ref 78.0–100.0)
Monocytes Absolute: 0.7 10*3/uL (ref 0.1–1.0)
Monocytes Relative: 11 %
Neutro Abs: 4.7 10*3/uL (ref 1.7–7.7)
Neutrophils Relative %: 73 %
Platelets: 112 10*3/uL — ABNORMAL LOW (ref 150–400)
RBC: 2.63 MIL/uL — AB (ref 4.22–5.81)
RDW: 14.2 % (ref 11.5–15.5)
WBC: 6.4 10*3/uL (ref 4.0–10.5)

## 2017-05-14 LAB — BASIC METABOLIC PANEL
ANION GAP: 9 (ref 5–15)
BUN: <5 mg/dL — ABNORMAL LOW (ref 6–20)
CHLORIDE: 104 mmol/L (ref 101–111)
CO2: 22 mmol/L (ref 22–32)
Calcium: 7.6 mg/dL — ABNORMAL LOW (ref 8.9–10.3)
Creatinine, Ser: 0.61 mg/dL (ref 0.61–1.24)
GFR calc non Af Amer: >60 mL/min (ref >60)
Glucose, Bld: 125 mg/dL — ABNORMAL HIGH (ref 65–99)
Potassium: 3.6 mmol/L (ref 3.5–5.1)
SODIUM: 135 mmol/L (ref 135–145)

## 2017-05-14 LAB — SODIUM
SODIUM: 143 mmol/L (ref 135–145)
Sodium: 137 mmol/L (ref 135–145)
Sodium: 140 mmol/L (ref 135–145)

## 2017-05-14 LAB — GLUCOSE, CAPILLARY
GLUCOSE-CAPILLARY: 132 mg/dL — AB (ref 65–99)
GLUCOSE-CAPILLARY: 130 mg/dL — AB (ref 65–99)
Glucose-Capillary: 115 mg/dL — ABNORMAL HIGH (ref 65–99)
Glucose-Capillary: 119 mg/dL — ABNORMAL HIGH (ref 65–99)
Glucose-Capillary: 123 mg/dL — ABNORMAL HIGH (ref 65–99)
Glucose-Capillary: 128 mg/dL — ABNORMAL HIGH (ref 65–99)

## 2017-05-14 LAB — PHOSPHORUS: PHOSPHORUS: 2.3 mg/dL — AB (ref 2.5–4.6)

## 2017-05-14 LAB — MAGNESIUM: Magnesium: 1.4 mg/dL — ABNORMAL LOW (ref 1.7–2.4)

## 2017-05-14 MED ORDER — VITAMIN K1 10 MG/ML IJ SOLN
10.0000 mg | Freq: Once | INTRAMUSCULAR | Status: AC
  Administered 2017-05-14: 10 mg via SUBCUTANEOUS
  Filled 2017-05-14: qty 1

## 2017-05-14 MED ORDER — SODIUM CHLORIDE 3 % IV SOLN
INTRAVENOUS | Status: DC
Start: 2017-05-14 — End: 2017-05-17
  Administered 2017-05-14 – 2017-05-16 (×6): 50 mL/h via INTRAVENOUS
  Filled 2017-05-14 (×15): qty 500

## 2017-05-14 MED ORDER — WHITE PETROLATUM EX OINT
TOPICAL_OINTMENT | CUTANEOUS | Status: AC
  Administered 2017-05-14: 16:00:00
  Filled 2017-05-14: qty 28.35

## 2017-05-14 NOTE — Progress Notes (Signed)
  OT Cancellation Note  Patient Details Name: Carlyn ReichertManuel Hulce MRN: [id number] DOB: [birth year]   Cancelled Treatment:    Reason Eval/Treat Not Completed: Active bedrest order. Will check back as appropriate.   Doristine Sectionharity A Jayani Rozman, MS OTR/L  Pager: [phone]   Doristine SectionCharity A Remmi Armenteros 05/14/2017, 7:33 AM

## 2017-05-14 NOTE — Progress Notes (Signed)
Patient ID: [name], male   DOB: 1966/02/23, 52 y.o.   MRN: [id number] Subjective: Patient is somnolent but arousable.  He will answer questions.  Objective: Vital signs in last 24 hours: Temp:  [98.3 F (36.8 C)-100.7 F (38.2 C)] 98.3 F (36.8 C) (02/23 0400) Pulse Rate:  [65-116] 70 (02/23 0900) Resp:  [16-31] 24 (02/23 0900) BP: (116-149)/(63-114) 122/64 (02/23 0900) SpO2:  [92 %-100 %] 93 % (02/23 0900) FiO2 (%):  [28 %] 28 % (02/23 0000) Estimated body mass index is 28.7 kg/m as calculated from the following:   Height as of this encounter: 6' (1.829 m).   Weight as of this encounter: 96 kg (211 lb 10.3 oz).   Intake/Output from previous day: 02/22 0701 - 02/23 0700 In: 2563.5 [I.V.:2153.5; IV Piggyback:410] Out: 1500 [Urine:1500] Intake/Output this shift: Total I/O In: 164.2 [I.V.:164.2] Out: -   Physical exam Glascow coma scale 13, E3M6V4.  The patient is left hemiplegic.  His pupils are equal.  I have reviewed the patient's follow-up head CT compared with yesterday's scan..  It demonstrates his right intracerebral hemorrhage, depressed skull fracture.  He has a bit more midline shift today.  Lab Results: Recent Labs    05/13/17 0223 05/14/17 0443  WBC 9.0 6.4  HGB 10.2* 8.8*  HCT 29.6* 26.7*  PLT 123* 112*   BMET Recent Labs    05/13/17 0223 05/14/17 0443  NA 137 135  K 3.0* 3.6  CL 104 104  CO2 20* 22  GLUCOSE 184* 125*  BUN <5* <5*  CREATININE 0.67 0.61  CALCIUM 7.7* 7.6*    Studies/Results: Dg Forearm Left  Result Date: 05/13/2017 CLINICAL DATA:  52 year old male with left upper extremity trauma. EXAM: LEFT HUMERUS - 2+ VIEW; LEFT FOREARM - 2 VIEW COMPARISON:  Left elbow radiograph dated 03/02/2017 FINDINGS: Faint small bony fragments along the posterior aspect of the olecranon process appear new compared to prior radiograph and may represent cortical fractures. No other acute fracture noted. There is no dislocation there is soft  tissue swelling over the olecranon versus fluid within the bursa. Clinical correlation is recommended. IMPRESSION: Probable small cortical avulsion fractures from the dorsal aspect of the olecranon, possibly at the insertion of the triceps tendon. No other acute fracture. Electronically Signed   By: Elgie Collard M.D.   On: 05/13/2017 05:19   Ct Head Wo Contrast  Result Date: 05/14/2017 CLINICAL DATA:  Follow-up intracranial hemorrhage. Increasing lethargy. EXAM: CT HEAD WITHOUT CONTRAST TECHNIQUE: Contiguous axial images were obtained from the base of the skull through the vertex without intravenous contrast. COMPARISON:  CT HEAD May 13, 2017 FINDINGS: BRAIN: Confluent RIGHT frontotemporal hemorrhagic contusions relatively unchanged. Stable small LEFT frontal hemorrhagic contusion. Small volume scattered subarachnoid hemorrhage is similar. Stable bilateral holohemispheric subdural hematomas measuring to 3 mm. Stable 5 mm falcotentorial subdural hematoma. Stable RIGHT lateral intraventricular hemorrhage, similarly effaced RIGHT lateral ventricle without LEFT ventricle entrapment. Small volume dependent blood products LEFT occipital horn. 8 mm RIGHT to LEFT midline shift, increased from 6 mm. Similar RIGHT uncal herniation. Basal cisterns patent. VASCULAR: Mild calcific atherosclerosis carotid siphons. SKULL/SOFT TISSUES: Segmental depressed RIGHT frontotemporal skull fracture unchanged. Nondisplaced LEFT frontoparietal skull fracture, better seen today due to motion on prior CT. Large bilateral scalp hematomas without subcutaneous gas or radiopaque foreign bodies. RIGHT facial ORIF with RIGHT temporal metallic foreign bodies, unchanged. ORBITS/SINUSES: RIGHT globe prosthesis. Mastoid aircells and included paranasal sinuses are well-aerated. OTHER: None. IMPRESSION: 1. Similar confluent RIGHT frontotemporal hemorrhagic contusions. Small  LEFT frontal lobe hemorrhagic contusion. 2. 8 mm RIGHT to LEFT  midline shift, increased from 6 mm. RIGHT uncal herniation. Intraventricular hemorrhage without LEFT ventricle entrapment. 3. Small volume subarachnoid hemorrhage with similar bilateral small holohemispheric subdural hematomas and 5 mm falcotentorial subdural hematoma. 4. Redemonstration of depressed RIGHT frontotemporal skull fracture. Nondisplaced LEFT frontoparietal skull fracture better seen on today's examination. Electronically Signed   By: Awilda Metro M.D.   On: 05/14/2017 04:22   Ct Head Wo Contrast  Result Date: 05/13/2017 CLINICAL DATA:  Found down 2 hours after discharged from hospital. LEFT head hematoma. Altered mental status. History of diabetes, hypertension, alcohol abuse, intracranial hemorrhage. EXAM: CT HEAD WITHOUT CONTRAST CT CERVICAL SPINE WITHOUT CONTRAST TECHNIQUE: Multidetector CT imaging of the head and cervical spine was performed following the standard protocol without intravenous contrast. Multiplanar CT image reconstructions of the cervical spine were also generated. COMPARISON:  CT HEAD and cervical spine April 29, 2017 FINDINGS: CT HEAD FINDINGS-mildly motion degraded examination. BRAIN: Confluent RIGHT frontotemporal hemorrhagic contusions measuring to 9.7 x 4.8 cm. Smaller LEFT frontal lobe hemorrhagic contusion. Small volume frontoparietal subarachnoid hemorrhage. Bilateral holohemispheric subdural hematomas measuring to 3 mm. 5 mm falcotentorial subdural hematoma extending to RIGHT greater than LEFT tentorium. RIGHT lateral intraventricular hemorrhage. Mildly effaced RIGHT lateral ventricle with 6 mm RIGHT to LEFT midline shift. No LEFT ventricle entrapment. Moderate global parenchymal brain volume loss. No hydrocephalus. RIGHT uncal herniation. Basal cisterns patent. VASCULAR: Mild calcific atherosclerosis. SKULL/SOFT TISSUES: Acute RIGHT frontotemporal skull fracture, 10 mm central depression. Moderate RIGHT and large LEFT posterior scalp hematoma. RIGHT face ORIF.  ORBITS/SINUSES: RIGHT globe prosthesis.The mastoid aircells and included paranasal sinuses are well-aerated. OTHER: None. CT CERVICAL SPINE FINDINGS-severely motion degraded examination. ALIGNMENT: Maintained reversed cervical lordosis. Limited assessment for malalignment due to motion. SKULL BASE AND VERTEBRAE: Vertebral body heights generally preserved. Multilevel severe degenerative disc. C1-2 articulation maintained. SOFT TISSUES AND SPINAL CANAL: Non suspicious though limited assessment. DISC LEVELS:  Nondiagnostic evaluation. UPPER CHEST: Clear. OTHER: None. IMPRESSION: CT HEAD: 1. Motion degraded examination. 2. Acute depressed RIGHT frontotemporal skull fracture. RIGHT frontotemporal confluent large acute hemorrhagic contusions. Small LEFT frontal lobe hemorrhagic contusion. 3. RIGHT uncal herniation and 6 mm RIGHT to LEFT ventricular entrapment. RIGHT lateral intraventricular hemorrhage. No LEFT ventricle entrapment. 4. Small volume acute subarachnoid hemorrhage. Small acute bilateral holohemispheric subdural hematomas and 5 mm acute focal tentorial subdural hematoma. CT CERVICAL SPINE: 1. Severely motion degraded examination. No definite fracture. Nondiagnostic assessment of malalignment. Critical Value/emergent results were called by telephone at the time of interpretation on 05/13/2017 at 4:18 am to Dr. Zadie Rhine , who verbally acknowledged these results. Electronically Signed   By: Awilda Metro M.D.   On: 05/13/2017 04:25   Ct Cervical Spine Wo Contrast  Result Date: 05/13/2017 CLINICAL DATA:  Found down 2 hours after discharged from hospital. LEFT head hematoma. Altered mental status. History of diabetes, hypertension, alcohol abuse, intracranial hemorrhage. EXAM: CT HEAD WITHOUT CONTRAST CT CERVICAL SPINE WITHOUT CONTRAST TECHNIQUE: Multidetector CT imaging of the head and cervical spine was performed following the standard protocol without intravenous contrast. Multiplanar CT image  reconstructions of the cervical spine were also generated. COMPARISON:  CT HEAD and cervical spine April 29, 2017 FINDINGS: CT HEAD FINDINGS-mildly motion degraded examination. BRAIN: Confluent RIGHT frontotemporal hemorrhagic contusions measuring to 9.7 x 4.8 cm. Smaller LEFT frontal lobe hemorrhagic contusion. Small volume frontoparietal subarachnoid hemorrhage. Bilateral holohemispheric subdural hematomas measuring to 3 mm. 5 mm falcotentorial subdural hematoma extending to RIGHT greater  than LEFT tentorium. RIGHT lateral intraventricular hemorrhage. Mildly effaced RIGHT lateral ventricle with 6 mm RIGHT to LEFT midline shift. No LEFT ventricle entrapment. Moderate global parenchymal brain volume loss. No hydrocephalus. RIGHT uncal herniation. Basal cisterns patent. VASCULAR: Mild calcific atherosclerosis. SKULL/SOFT TISSUES: Acute RIGHT frontotemporal skull fracture, 10 mm central depression. Moderate RIGHT and large LEFT posterior scalp hematoma. RIGHT face ORIF. ORBITS/SINUSES: RIGHT globe prosthesis.The mastoid aircells and included paranasal sinuses are well-aerated. OTHER: None. CT CERVICAL SPINE FINDINGS-severely motion degraded examination. ALIGNMENT: Maintained reversed cervical lordosis. Limited assessment for malalignment due to motion. SKULL BASE AND VERTEBRAE: Vertebral body heights generally preserved. Multilevel severe degenerative disc. C1-2 articulation maintained. SOFT TISSUES AND SPINAL CANAL: Non suspicious though limited assessment. DISC LEVELS:  Nondiagnostic evaluation. UPPER CHEST: Clear. OTHER: None. IMPRESSION: CT HEAD: 1. Motion degraded examination. 2. Acute depressed RIGHT frontotemporal skull fracture. RIGHT frontotemporal confluent large acute hemorrhagic contusions. Small LEFT frontal lobe hemorrhagic contusion. 3. RIGHT uncal herniation and 6 mm RIGHT to LEFT ventricular entrapment. RIGHT lateral intraventricular hemorrhage. No LEFT ventricle entrapment. 4. Small volume acute  subarachnoid hemorrhage. Small acute bilateral holohemispheric subdural hematomas and 5 mm acute focal tentorial subdural hematoma. CT CERVICAL SPINE: 1. Severely motion degraded examination. No definite fracture. Nondiagnostic assessment of malalignment. Critical Value/emergent results were called by telephone at the time of interpretation on 05/13/2017 at 4:18 am to Dr. Zadie RhineNALD WICKLINE , who verbally acknowledged these results. Electronically Signed   By: Awilda Metroourtnay  Bloomer M.D.   On: 05/13/2017 04:25   Dg Chest Port 1 View  Result Date: 05/13/2017 CLINICAL DATA:  52 year old male with weakness. EXAM: PORTABLE CHEST 1 VIEW COMPARISON:  Chest radiograph dated 04/22/2017 FINDINGS: There are bibasilar streaky densities, likely atelectatic changes. No focal consolidation, pleural effusion, or pneumothorax. The cardiac silhouette is within normal limits. No acute osseous pathology. IMPRESSION: Probable bibasilar atelectasis.  No focal consolidation Electronically Signed   By: Elgie CollardArash  Radparvar M.D.   On: 05/13/2017 02:52   Dg Humerus Left  Result Date: 05/13/2017 CLINICAL DATA:  52 year old male with left upper extremity trauma. EXAM: LEFT HUMERUS - 2+ VIEW; LEFT FOREARM - 2 VIEW COMPARISON:  Left elbow radiograph dated 03/02/2017 FINDINGS: Faint small bony fragments along the posterior aspect of the olecranon process appear new compared to prior radiograph and may represent cortical fractures. No other acute fracture noted. There is no dislocation there is soft tissue swelling over the olecranon versus fluid within the bursa. Clinical correlation is recommended. IMPRESSION: Probable small cortical avulsion fractures from the dorsal aspect of the olecranon, possibly at the insertion of the triceps tendon. No other acute fracture. Electronically Signed   By: Elgie CollardArash  Radparvar M.D.   On: 05/13/2017 05:19    Assessment/Plan: Right depressed skull fracture, intracerebral hemorrhage: There is still no family members  around to discuss the situation.  We will continue with aggressive medical management.  We will add 3% sodium.  LOS: 1 day     Cristi LoronJeffrey D Malayna Noori 05/14/2017, 9:21 AM

## 2017-05-14 NOTE — Progress Notes (Signed)
SLP Cancellation Note  Patient Details Name: Carlyn ReichertManuel Jessie MRN: [id number] DOB: 1965/08/25   Cancelled treatment:       Reason Eval/Treat Not Completed: Medical issues which prohibited therapy. Per RN pt to remain NPO for possible surgery. Will continue to follow.  Rondel BatonMary Beth Adrean Heitz, [place], CCC-SLP Speech-Language Pathologist [phone]   Arlana LindauMary E Neftali Thurow 05/14/2017, 9:03 AM

## 2017-05-14 NOTE — Plan of Care (Signed)
Pt has bed in lowest position, bed alarm is on exiting and floor mats are place.  Heloise PurpuraSusan Marleni Gallardo RN

## 2017-05-14 NOTE — Progress Notes (Signed)
PT Cancellation Note  Patient Details Name: Christopher ReichertManuel Shaffer MRN: [id number] DOB: 1965/12/20   Cancelled Treatment:     Active bedrest orders at this time   Fabio AsaDevon J Vala Raffo 05/14/2017, 7:18 AM

## 2017-05-14 NOTE — Progress Notes (Signed)
PULMONARY / CRITICAL CARE MEDICINE   Name: [name] MRN: [id number] DOB: [birth year]    ADMISSION DATE:  05/13/2017  HISTORY OF PRESENT ILLNESS:       Christopher ReichertManuel Shaffer is a 52 y.o. male with PMH as outlined below including EtOH abuse.  [facility] with a head injury.  Came to ED night prior with EMS due to AMS and was found to have EtOH level of 277.  He did not appear to have any acute injuries and after monitoring in ED for several hours, was discharged home. Returned to ED early AM 2/22 and appeared to have head injury.  Had CT that demonstrated acute depressed right frontotemoporal skull fx, right frontotemporal hemorrhagic contusions, small left frontal lobe hemorrhagic contusion, right uncal herniation with 6mm R to L ventricular entratpment, right lateral intraventricular hemorrhage, small volume SAH, small acute bilateral SDH's. Left humerus xray demonstrated probable small cortical avulsion fx.  He was admitted by neurosurgery and PCCM was asked to see in consultation.    PAST MEDICAL HISTORY :  He  has a past medical history of Abdominal aneurysm (HCC), Chronic kidney disease, stage III (moderate) (HCC), Hypertension, Obesity (BMI 30-39.9), Other and unspecified hyperlipidemia, and Type II or unspecified type diabetes mellitus without mention of complication, not stated as uncontrolled.  PAST SURGICAL HISTORY: He  has a past surgical history that includes stomach vessel aneurysm.  No Known Allergies  No current facility-administered medications on file prior to encounter.    No current outpatient medications on file prior to encounter.    FAMILY HISTORY:  His indicated that the status of his mother is unknown.   SOCIAL HISTORY: He  reports that he is a non-smoker but has been exposed to tobacco smoke. he has never used smokeless tobacco. He reports that he drinks about 7.2 oz of alcohol per week. He reports that he does not use drugs.  REVIEW OF  SYSTEMS:   Unobtainable  SUBJECTIVE:     He is sedated on 0.3 mcg of Precedex during my examination this morning.  He is lethargic and somewhat difficult to awaken, I am told by nursing that his mental status waxes and wanes.  He denies any pain other than in his head. VITAL SIGNS: BP 119/78   Pulse 69   Temp 98.3 F (36.8 C) (Oral)   Resp (!) 29   Ht 6' (1.829 m)   Wt 211 lb 10.3 oz (96 kg)   SpO2 96%   BMI 28.70 kg/m   HEMODYNAMICS:    VENTILATOR SETTINGS: FiO2 (%):  [28 %] 28 %  INTAKE / OUTPUT: I/O last 3 completed shifts: In: 2563.5 [I.V.:2153.5; IV Piggyback:410] Out: 1500 [Urine:1500]  PHYSICAL EXAMINATION: General: Lethargic and in no acute distress. Neuro: Lethargic and disoriented.  Moving all but the left upper extremity to noxious stimuli.  Right eye is prosthetic.  Left pupil is reactive. Cardiovascular: S1 and S2 are regular without murmur rub or gallop. Lungs: Operations are unlabored there is symmetric air movement and no wheezes. Abdomen: The abdomen is soft without any organomegaly masses tenderness guarding or rebound I specifically do not appreciate any splenomegaly.  He is anicteric  LABS:  BMET Recent Labs  Lab 05/08/17 1641 05/12/17 2013 05/13/17 0223 05/14/17 0443  NA 139 139 137 135  K 3.6 3.5 3.0* 3.6  CL 104 100* 104 104  CO2 24  --  20* 22  BUN <5* 5* <5* <5*  CREATININE 0.67 1.00 0.67 0.61  GLUCOSE 210* 173* 184* 125*    Electrolytes Recent Labs  Lab 05/08/17 1641 05/13/17 0223 05/14/17 0443  CALCIUM 8.2* 7.7* 7.6*  MG  --   --  1.4*  PHOS  --   --  2.3*    CBC Recent Labs  Lab 05/12/17 1921 05/12/17 2013 05/13/17 0223 05/14/17 0443  WBC 6.9  --  9.0 6.4  HGB 9.2* 9.2* 10.2* 8.8*  HCT 27.0* 27.0* 29.6* 26.7*  PLT 133*  --  123* 112*    Coag's Recent Labs  Lab 05/13/17 0501  APTT 34  INR 1.23    Sepsis Markers No results for input(s): LATICACIDVEN, PROCALCITON, O2SATVEN in the last 168 hours.  ABG No  results for input(s): PHART, PCO2ART, PO2ART in the last 168 hours.  Liver Enzymes Recent Labs  Lab 05/08/17 1641 05/13/17 0223  AST 72* 91*  ALT 30 31  ALKPHOS 223* 224*  BILITOT 1.5* 2.0*  ALBUMIN 2.7* 2.6*    Cardiac Enzymes No results for input(s): TROPONINI, PROBNP in the last 168 hours.  Glucose Recent Labs  Lab 05/13/17 1203 05/13/17 1633 05/13/17 1955 05/14/17 0020 05/14/17 0357 05/14/17 0823  GLUCAP 155* 114* 135* 128* 132* 130*    Imaging Ct Head Wo Contrast  Result Date: 05/14/2017 CLINICAL DATA:  Follow-up intracranial hemorrhage. Increasing lethargy. EXAM: CT HEAD WITHOUT CONTRAST TECHNIQUE: Contiguous axial images were obtained from the base of the skull through the vertex without intravenous contrast. COMPARISON:  CT HEAD May 13, 2017 FINDINGS: BRAIN: Confluent RIGHT frontotemporal hemorrhagic contusions relatively unchanged. Stable small LEFT frontal hemorrhagic contusion. Small volume scattered subarachnoid hemorrhage is similar. Stable bilateral holohemispheric subdural hematomas measuring to 3 mm. Stable 5 mm falcotentorial subdural hematoma. Stable RIGHT lateral intraventricular hemorrhage, similarly effaced RIGHT lateral ventricle without LEFT ventricle entrapment. Small volume dependent blood products LEFT occipital horn. 8 mm RIGHT to LEFT midline shift, increased from 6 mm. Similar RIGHT uncal herniation. Basal cisterns patent. VASCULAR: Mild calcific atherosclerosis carotid siphons. SKULL/SOFT TISSUES: Segmental depressed RIGHT frontotemporal skull fracture unchanged. Nondisplaced LEFT frontoparietal skull fracture, better seen today due to motion on prior CT. Large bilateral scalp hematomas without subcutaneous gas or radiopaque foreign bodies. RIGHT facial ORIF with RIGHT temporal metallic foreign bodies, unchanged. ORBITS/SINUSES: RIGHT globe prosthesis. Mastoid aircells and included paranasal sinuses are well-aerated. OTHER: None. IMPRESSION: 1.  Similar confluent RIGHT frontotemporal hemorrhagic contusions. Small LEFT frontal lobe hemorrhagic contusion. 2. 8 mm RIGHT to LEFT midline shift, increased from 6 mm. RIGHT uncal herniation. Intraventricular hemorrhage without LEFT ventricle entrapment. 3. Small volume subarachnoid hemorrhage with similar bilateral small holohemispheric subdural hematomas and 5 mm falcotentorial subdural hematoma. 4. Redemonstration of depressed RIGHT frontotemporal skull fracture. Nondisplaced LEFT frontoparietal skull fracture better seen on today's examination. Electronically Signed   By: Awilda Metro M.D.   On: 05/14/2017 04:22      DISCUSSION:      This is a 52 year old alcoholic who is suffered from a right frontal temporal depressed skull fracture with underlying cerebral contusion.  His only other known injury is a avulsion fracture of the left arm.  ASSESSMENT / PLAN:  PULMONARY A: No active issues  CARDIOVASCULAR A: No active issues    GASTROINTESTINAL A: Prophylaxis is with Protonix  HEMATOLOGIC A: Platelet count is dropping.  Continues folate, INR is marginally elevated, I am administering a dose of vitamin K today.  INFECTIOUS A: No active issues  NEUROLOGIC A: Mental status is difficult to parse out as there appears to be some component  of withdrawal.  I am awaiting decision by neurosurgery as to whether or not we will add 3% saline. Penny Pia, MD Critical Care [facility] Pager: [phone]  05/14/2017, 8:46 AM

## 2017-05-15 LAB — GLUCOSE, CAPILLARY
GLUCOSE-CAPILLARY: 154 mg/dL — AB (ref 65–99)
Glucose-Capillary: 122 mg/dL — ABNORMAL HIGH (ref 65–99)
Glucose-Capillary: 128 mg/dL — ABNORMAL HIGH (ref 65–99)
Glucose-Capillary: 129 mg/dL — ABNORMAL HIGH (ref 65–99)
Glucose-Capillary: 130 mg/dL — ABNORMAL HIGH (ref 65–99)
Glucose-Capillary: 213 mg/dL — ABNORMAL HIGH (ref 65–99)

## 2017-05-15 LAB — BASIC METABOLIC PANEL
ANION GAP: 7 (ref 5–15)
BUN: 7 mg/dL (ref 6–20)
CALCIUM: 7.8 mg/dL — AB (ref 8.9–10.3)
CO2: 21 mmol/L — ABNORMAL LOW (ref 22–32)
Chloride: 116 mmol/L — ABNORMAL HIGH (ref 101–111)
Creatinine, Ser: 0.64 mg/dL (ref 0.61–1.24)
Glucose, Bld: 145 mg/dL — ABNORMAL HIGH (ref 65–99)
Potassium: 3.6 mmol/L (ref 3.5–5.1)
Sodium: 144 mmol/L (ref 135–145)

## 2017-05-15 LAB — CBC WITH DIFFERENTIAL/PLATELET
BASOS ABS: 0 10*3/uL (ref 0.0–0.1)
Basophils Relative: 0 %
EOS ABS: 0 10*3/uL (ref 0.0–0.7)
EOS PCT: 0 %
HCT: 28 % — ABNORMAL LOW (ref 39.0–52.0)
Hemoglobin: 9.2 g/dL — ABNORMAL LOW (ref 13.0–17.0)
LYMPHS PCT: 17 %
Lymphs Abs: 0.9 10*3/uL (ref 0.7–4.0)
MCH: 34.2 pg — AB (ref 26.0–34.0)
MCHC: 32.9 g/dL (ref 30.0–36.0)
MCV: 104.1 fL — AB (ref 78.0–100.0)
MONO ABS: 0.5 10*3/uL (ref 0.1–1.0)
Monocytes Relative: 9 %
Neutro Abs: 3.8 10*3/uL (ref 1.7–7.7)
Neutrophils Relative %: 74 %
PLATELETS: 116 10*3/uL — AB (ref 150–400)
RBC: 2.69 MIL/uL — ABNORMAL LOW (ref 4.22–5.81)
RDW: 14.4 % (ref 11.5–15.5)
WBC: 5.2 10*3/uL (ref 4.0–10.5)

## 2017-05-15 LAB — SODIUM
Sodium: 142 mmol/L (ref 135–145)
Sodium: 145 mmol/L (ref 135–145)
Sodium: 145 mmol/L (ref 135–145)

## 2017-05-15 LAB — PROTIME-INR
INR: 1.38
PROTHROMBIN TIME: 16.9 s — AB (ref 11.4–15.2)

## 2017-05-15 LAB — PHOSPHORUS: Phosphorus: 2.4 mg/dL — ABNORMAL LOW (ref 2.5–4.6)

## 2017-05-15 LAB — MAGNESIUM: MAGNESIUM: 1.5 mg/dL — AB (ref 1.7–2.4)

## 2017-05-15 MED ORDER — RESOURCE THICKENUP CLEAR PO POWD
ORAL | Status: DC | PRN
Start: 2017-05-15 — End: 2017-07-27
  Administered 2017-05-27 – 2017-06-06 (×2): via ORAL
  Filled 2017-05-15 (×7): qty 125

## 2017-05-15 MED ORDER — MAGNESIUM SULFATE 2 GM/50ML IV SOLN
2.0000 g | Freq: Once | INTRAVENOUS | Status: AC
  Administered 2017-05-15: 2 g via INTRAVENOUS
  Filled 2017-05-15: qty 50

## 2017-05-15 MED ORDER — POTASSIUM PHOSPHATES 15 MMOLE/5ML IV SOLN
20.0000 meq | Freq: Once | INTRAVENOUS | Status: AC
  Administered 2017-05-15: 20 meq via INTRAVENOUS
  Filled 2017-05-15: qty 4.55

## 2017-05-15 NOTE — Evaluation (Signed)
Clinical/Bedside Swallow Evaluation Patient Details  Name: Christopher ReichertManuel Shaffer MRN: [id number] Date of Birth: [birth year]  Today's Date: 05/15/2017 Time: SLP Start Time (ACUTE ONLY): 1207 SLP Stop Time (ACUTE ONLY): 1227 SLP Time Calculation (min) (ACUTE ONLY): 20 min  Past Medical History:  Past Medical History:  Diagnosis Date  . Abdominal aneurysm [facility](HCC)    Patient reported   . Chronic kidney disease, stage III (moderate) (HCC)    Deterding  . Hypertension   . Obesity (BMI 30-39.9)   . Other and unspecified hyperlipidemia   . Type II or unspecified type diabetes mellitus without mention of complication, not stated as uncontrolled    Past Surgical History:  Past Surgical History:  Procedure Laterality Date  . stomach vessel aneurysm     Patient reported    HPI:  Patient is a 52 yo male, chronic alcoholic with right ICH, small SDH and skull fracture with right to left shift increased from 6 to 8mm per CT 05/14/17. Thrombocytopenia with likely poorly functioning platelets. Left hemiparesis with left neglect and loss of vision on left.   Assessment / Plan / Recommendation Clinical Impression  Patient presents with left sided facial weakness and cognitive deficits impacting his safety with POs. He presents with overt signs of aspiration including immediate coughing with teaspoons of thin and nectar thick liquids, cup sips of honey thick liquids. Pt's behaviors most consistent with [place], confused/appropriate. Pt is alert and oriented x3 (states location as [facility]). He is able to tell SLP his reason for admission. Highly perseverative, requesting water, "fruit stuff," juice repeatedly even in the midst of PO trials. He frequently makes requests prior to initiating swallow, holding POs in his mouth with what I suspect is premature spillage to the pharynx, resulting in airway compromise intermittently, eliciting cough response. With diagnostic trials, SLP determined most successful  strategy is to reduce verbal cues, use dry spoon to cue swallow initiation if needed. Using these strategies, there are no overt signs of aspiration observed with teaspoons of purees or honey-thick liquids. When SLP assisted pt with limited size cup sip of honey thick liquid, he had an immediate, explosive cough, expelling liquid. Pt is not yet ready to initiate full diet. May have bites of puree and teaspoons of honey-thick liquids from floor stock, medications crushed in puree with full supervision; cue for swallow with dry spoon if needed. D/w RN.   SLP Visit Diagnosis: Dysphagia, oropharyngeal phase (R13.12)    Aspiration Risk  Moderate aspiration risk    Diet Recommendation Other (Comment)(teaspoons of honey-thick liquid, puree from floor stock)   Liquid Administration via: Spoon Medication Administration: Crushed with puree Supervision: Full supervision/cueing for compensatory strategies Compensations: Minimize environmental distractions;Small sips/bites;Slow rate(minimize verbal cues; cue swallow with dry spoon if needed) Postural Changes: Seated upright at 90 degrees    Other  Recommendations Oral Care Recommendations: Oral care QID Other Recommendations: Order thickener from pharmacy;Prohibited food (jello, ice cream, thin soups);Remove water pitcher;Have oral suction available   Follow up Recommendations Inpatient Rehab      Frequency and Duration min 3x week  2 weeks       Prognosis Prognosis for Safe Diet Advancement: Good Barriers to Reach Goals: Cognitive deficits      Swallow Study   General Date of Onset: 05/13/17 HPI: Patient is a 52 yo male, chronic alcoholic with right ICH, small SDH and skull fracture with right to left shift increased from 6 to 8mm per CT 05/14/17. Thrombocytopenia with likely poorly functioning platelets.  Left hemiparesis with left neglect and loss of vision on left. Type of Study: Bedside Swallow Evaluation Previous Swallow Assessment: none  on file Diet Prior to this Study: NPO Temperature Spikes Noted: No Respiratory Status: Room air History of Recent Intubation: No Behavior/Cognition: Alert;Confused;Distractible;Requires cueing;Impulsive Oral Cavity Assessment: Dry;Dried secretions Oral Care Completed by SLP: Yes Oral Cavity - Dentition: Adequate natural dentition Vision: Functional for self-feeding Self-Feeding Abilities: Needs assist Patient Positioning: Upright in bed Baseline Vocal Quality: Normal Volitional Cough: Strong Volitional Swallow: Unable to elicit    Oral/Motor/Sensory Function Overall Oral Motor/Sensory Function: Moderate impairment Facial ROM: Reduced left Facial Symmetry: Abnormal symmetry left Facial Strength: Reduced left Lingual Symmetry: Within Functional Limits Velum: Within Functional Limits Mandible: Within Functional Limits   Ice Chips Ice chips: Impaired Oral Phase Impairments: Poor awareness of bolus Oral Phase Functional Implications: Prolonged oral transit   Thin Liquid Thin Liquid: Impaired Presentation: Spoon;Cup Oral Phase Impairments: Poor awareness of bolus Oral Phase Functional Implications: Left anterior spillage;Prolonged oral transit Pharyngeal  Phase Impairments: Cough - Immediate;Suspected delayed Swallow    Nectar Thick Nectar Thick Liquid: Impaired Presentation: Spoon Oral Phase Impairments: Poor awareness of bolus Oral phase functional implications: Prolonged oral transit Pharyngeal Phase Impairments: Cough - Delayed;Suspected delayed Swallow   Honey Thick Honey Thick Liquid: Impaired Presentation: Spoon;Cup Oral Phase Impairments: Poor awareness of bolus Oral Phase Functional Implications: Prolonged oral transit Pharyngeal Phase Impairments: Cough - Immediate;Suspected delayed Swallow(explosive cough- with cup sip only)   Puree Puree: Impaired Presentation: Spoon Oral Phase Impairments: Poor awareness of bolus Oral Phase Functional Implications: Prolonged oral  transit Pharyngeal Phase Impairments: Suspected delayed Swallow   Solid   GO   Solid: Not tested       Rondel Baton, MS, CCC-SLP Speech-Language Pathologist [phone]  Arlana Lindau 05/15/2017,2:31 PM

## 2017-05-15 NOTE — Evaluation (Signed)
Physical Therapy Evaluation Patient Details Name: Christopher ReichertManuel Shaffer MRN: [id number] DOB: 1965/05/12 Today's Date: 05/15/2017   History of Present Illness  Patient is a 52 yo male, chronic alcoholic with right ICH, small SDH and skull fracture with right to left shift of 6 mm.  Thrombocytopenia with likely poorly functioning platelets.  Left hemiparesis with left neglect and loss of vision on left.   Clinical Impression  Orders received for PT evaluation. Patient demonstrates deficits in functional mobility as indicated below. Will benefit from continued skilled PT to address deficits and maximize function. Will see as indicated and progress as tolerated.  Prior to admission patient was independent and resides at home alone (per patient). Questionable historian. Patient demonstrating significant left sided deficits, poor trunk control and notable cognitive issues related to injuries. At this time, patient requiring increased physical assist for all aspects of mobility and function. Patient will need extensive post acute therapies. Will recommend CIR consult at this time.    Follow Up Recommendations CIR    Equipment Recommendations  (TBD)    Recommendations for Other Services Rehab consult     Precautions / Restrictions Precautions Precautions: Fall Precaution Comments: TBI Required Braces or Orthoses: Sling Restrictions Weight Bearing Restrictions: Yes LUE Weight Bearing: Non weight bearing      Mobility  Bed Mobility Overal bed mobility: Needs Assistance Bed Mobility: Supine to Sit;Sit to Supine     Supine to sit: Max assist;+2 for physical assistance;+2 for safety/equipment Sit to supine: Max assist;+2 for physical assistance;+2 for safety/equipment   General bed mobility comments: Patient with very minimal ability to engage and follow instruction for positioning. Assist for trunk control during elevation to upright and return to supine. Patient was able to bring RLE to EOB  and assisted with return to bed.   Transfers                 General transfer comment: attempted but unable to perform, increased   Ambulation/Gait                Stairs            Wheelchair Mobility    Modified Rankin (Stroke Patients Only) Modified Rankin (Stroke Patients Only) Pre-Morbid Rankin Score: No symptoms Modified Rankin: Severe disability     Balance Overall balance assessment: Needs assistance Sitting-balance support: Feet supported Sitting balance-Leahy Scale: Poor Sitting balance - Comments: patient required hands on physical assist at all times, moderate to max at times. Significant left lateral lean with posterior bias and difficulty self correcting. Poor overall trunk control Postural control: Left lateral lean;Posterior lean Standing balance support: During functional activity Standing balance-Leahy Scale: Zero                               Pertinent Vitals/Pain      Home Living Family/patient expects to be discharged to:: Private residence Living Arrangements: Alone Available Help at Discharge: Family Type of Home: House Home Access: Stairs to enter   Secretary/administratorntrance Stairs-Number of Steps: 6 Home Layout: One level Home Equipment: None      Prior Function Level of Independence: Independent         Comments: per chart review, 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     Hand Dominance   Dominant Hand: Right    Extremity/Trunk Assessment   Upper Extremity Assessment Upper Extremity Assessment: LUE deficits/detail;Difficult to assess due to impaired cognition LUE: Unable to fully assess due  to immobilization    Lower Extremity Assessment Lower Extremity Assessment: Generalized weakness;LLE deficits/detail;Difficult to assess due to impaired cognition LLE Deficits / Details: patient with trace activation of LLE, noted increased muscle tone when attempting activity. Poor ability to engage in testing  LLE  Coordination: decreased fine motor;decreased gross motor       Communication   Communication: No difficulties  Cognition Arousal/Alertness: Awake/alert Behavior During Therapy: Restless;Impulsive Overall Cognitive Status: Impaired/Different from baseline Area of Impairment: Attention;Memory;Following commands;Safety/judgement;Awareness;Problem solving;Rancho level               Rancho Levels of Cognitive Functioning [place]igos Scales of Cognitive Functioning: Confused/appropriate   Current Attention Level: Sustained Memory: Decreased recall of precautions;Decreased short-term memory(patient with inability to recall information being provided) Following Commands: Follows one step commands with increased time Safety/Judgement: Decreased awareness of safety;Decreased awareness of deficits Awareness: Intellectual Problem Solving: Slow processing;Difficulty sequencing;Requires verbal cues;Requires tactile cues General Comments: patient extremely perseverative throughout session with inability to recall/carryover information from therapist. Questionable historian despite being alert and oreinted x4. Patient appears to have some confabulation at times.       General Comments      Exercises     Assessment/Plan    PT Assessment Patient needs continued PT services  PT Problem List Decreased strength;Decreased activity tolerance;Decreased balance;Decreased coordination;Decreased mobility;Decreased cognition;Decreased knowledge of use of DME;Decreased safety awareness;Decreased knowledge of precautions;Pain       PT Treatment Interventions DME instruction;Gait training;Functional mobility training;Therapeutic activities;Therapeutic exercise;Balance training;Neuromuscular re-education;Stair training;Cognitive remediation;Patient/family education    PT Goals (Current goals can be found in the Care Plan section)  Acute Rehab PT Goals Patient Stated Goal: to have a drink  PT  Goal Formulation: With patient Time For Goal Achievement: 05/29/17 Potential to Achieve Goals: Good    Frequency Min 4X/week   Barriers to discharge        Co-evaluation               AM-PAC PT "6 Clicks" Daily Activity  Outcome Measure Difficulty turning over in bed (including adjusting bedclothes, sheets and blankets)?: Unable Difficulty moving from lying on back to sitting on the side of the bed? : Unable Difficulty sitting down on and standing up from a chair with arms (e.g., wheelchair, bedside commode, etc,.)?: Unable Help needed moving to and from a bed to chair (including a wheelchair)?: Total Help needed walking in hospital room?: Total Help needed climbing 3-5 steps with a railing? : Total 6 Click Score: 6    End of Session Equipment Utilized During Treatment: Gait belt;Other (comment)(LUE sling) Activity Tolerance: Patient limited by pain;Other (comment)(cognition and safety) Patient left: in bed;with call bell/phone within reach;with nursing/sitter in room;with SCD's reapplied;Other (comment)(in chair position with mitts) Nurse Communication: Mobility status PT Visit Diagnosis: Muscle weakness (generalized) (M62.81);History of falling (Z91.81);Difficulty in walking, not elsewhere classified (R26.2);Other symptoms and signs involving the nervous system (R29.898)    Time: 1610-9604 PT Time Calculation (min) (ACUTE ONLY): 25 min   Charges:   PT Evaluation $PT Eval Moderate Complexity: 1 Mod PT Treatments $Therapeutic Activity: 8-22 mins   PT G Codes:        Charlotte Crumb, PT DPT  Board Certified Neurologic Specialist [phone]   Fabio Asa 05/15/2017, 9:50 AM

## 2017-05-15 NOTE — Progress Notes (Signed)
Patient ID: [name], male   DOB: July 04, 1965, 52 y.o.   MRN: [id number] Subjective:  the patient is more alert.  Objective: Vital signs in last 24 hours: Temp:  [98 F (36.7 C)-99.2 F (37.3 C)] 98.5 F (36.9 C) (02/24 0800) Pulse Rate:  [65-89] 75 (02/24 0800) Resp:  [14-31] 14 (02/24 0800) BP: (124-171)/(63-112) 157/94 (02/24 0800) SpO2:  [89 %-100 %] 98 % (02/24 0800) Estimated body mass index is 28.7 kg/m as calculated from the following:   Height as of this encounter: 6' (1.829 m).   Weight as of this encounter: 96 kg (211 lb 10.3 oz).   Intake/Output from previous day: 02/23 0701 - 02/24 0700 In: 2995.1 [I.V.:2785.1; IV Piggyback:210] Out: 1250 [Urine:1250] Intake/Output this shift: Total I/O In: 50 [I.V.:50] Out: -   Physical exam the patient is alert and oriented 2. Glasgow Coma Scale 14. He is left hemiplegic.  Lab Results: Recent Labs    05/14/17 0443 05/15/17 0319  WBC 6.4 5.2  HGB 8.8* 9.2*  HCT 26.7* 28.0*  PLT 112* 116*   BMET Recent Labs    05/13/17 0223 05/14/17 0443  05/14/17 2111 05/15/17 0319  NA 137 135   < > 143 142  K 3.0* 3.6  --   --   --   CL 104 104  --   --   --   CO2 20* 22  --   --   --   GLUCOSE 184* 125*  --   --   --   BUN <5* <5*  --   --   --   CREATININE 0.67 0.61  --   --   --   CALCIUM 7.7* 7.6*  --   --   --    < > = values in this interval not displayed.    Studies/Results: Ct Head Wo Contrast  Result Date: 05/14/2017 CLINICAL DATA:  Follow-up intracranial hemorrhage. Increasing lethargy. EXAM: CT HEAD WITHOUT CONTRAST TECHNIQUE: Contiguous axial images were obtained from the base of the skull through the vertex without intravenous contrast. COMPARISON:  CT HEAD May 13, 2017 FINDINGS: BRAIN: Confluent RIGHT frontotemporal hemorrhagic contusions relatively unchanged. Stable small LEFT frontal hemorrhagic contusion. Small volume scattered subarachnoid hemorrhage is similar. Stable bilateral  holohemispheric subdural hematomas measuring to 3 mm. Stable 5 mm falcotentorial subdural hematoma. Stable RIGHT lateral intraventricular hemorrhage, similarly effaced RIGHT lateral ventricle without LEFT ventricle entrapment. Small volume dependent blood products LEFT occipital horn. 8 mm RIGHT to LEFT midline shift, increased from 6 mm. Similar RIGHT uncal herniation. Basal cisterns patent. VASCULAR: Mild calcific atherosclerosis carotid siphons. SKULL/SOFT TISSUES: Segmental depressed RIGHT frontotemporal skull fracture unchanged. Nondisplaced LEFT frontoparietal skull fracture, better seen today due to motion on prior CT. Large bilateral scalp hematomas without subcutaneous gas or radiopaque foreign bodies. RIGHT facial ORIF with RIGHT temporal metallic foreign bodies, unchanged. ORBITS/SINUSES: RIGHT globe prosthesis. Mastoid aircells and included paranasal sinuses are well-aerated. OTHER: None. IMPRESSION: 1. Similar confluent RIGHT frontotemporal hemorrhagic contusions. Small LEFT frontal lobe hemorrhagic contusion. 2. 8 mm RIGHT to LEFT midline shift, increased from 6 mm. RIGHT uncal herniation. Intraventricular hemorrhage without LEFT ventricle entrapment. 3. Small volume subarachnoid hemorrhage with similar bilateral small holohemispheric subdural hematomas and 5 mm falcotentorial subdural hematoma. 4. Redemonstration of depressed RIGHT frontotemporal skull fracture. Nondisplaced LEFT frontoparietal skull fracture better seen on today's examination. Electronically Signed   By: Awilda Metro M.D.   On: 05/14/2017 04:22    Assessment/Plan: Right depressed skull fracture, intracerebral hemorrhage: The  patient is better clinically today. We will continue 3% sodium and clinical observation.  LOS: 2 days     Cristi LoronJeffrey D Gor Vestal 05/15/2017, 10:02 AM

## 2017-05-15 NOTE — Progress Notes (Signed)
Rehab Admissions Coordinator Note:  Patient was screened by Clois DupesBoyette, Corrina Steffensen Godwin for appropriateness for an Inpatient Acute Rehab Consult per PT recommendation.  At this time, we are recommending an inpt rehab consult. Please place order.Clois Dupes.  Adri Schloss Godwin 05/15/2017, 5:22 PM  I can be reached at [phone].

## 2017-05-15 NOTE — Progress Notes (Signed)
PULMONARY / CRITICAL CARE MEDICINE   Name: Christopher ReichertManuel Albin MRN: [id number] DOB: 1965/11/05    ADMISSION DATE:  05/13/2017  HISTORY OF PRESENT ILLNESS:       [name] is a 52 y.o. male with PMH as outlined below including EtOH abuse.  [facility] with a head injury.  Came to ED night prior with EMS due to AMS and was found to have EtOH level of 277.  He did not appear to have any acute injuries and after monitoring in ED for several hours, was discharged home. Returned to ED early AM 2/22 and appeared to have head injury.  Had CT that demonstrated acute depressed right frontotemoporal skull fx, right frontotemporal hemorrhagic contusions, small left frontal lobe hemorrhagic contusion, right uncal herniation with 6mm R to L ventricular entratpment, right lateral intraventricular hemorrhage, small volume SAH, small acute bilateral SDH's. Left humerus xray demonstrated probable small cortical avulsion fx.  He was admitted by neurosurgery and PCCM was asked to see in consultation.    PAST MEDICAL HISTORY :  He  has a past medical history of Abdominal aneurysm (HCC), Chronic kidney disease, stage III (moderate) (HCC), Hypertension, Obesity (BMI 30-39.9), Other and unspecified hyperlipidemia, and Type II or unspecified type diabetes mellitus without mention of complication, not stated as uncontrolled.  PAST SURGICAL HISTORY: He  has a past surgical history that includes stomach vessel aneurysm.  No Known Allergies  No current facility-administered medications on file prior to encounter.    No current outpatient medications on file prior to encounter.    FAMILY HISTORY:  His indicated that the status of his mother is unknown.   SOCIAL HISTORY: He  reports that he is a non-smoker but has been exposed to tobacco smoke. he has never used smokeless tobacco. He reports that he drinks about 7.2 oz of alcohol per week. He reports that he does not use drugs.  REVIEW OF  SYSTEMS:   Unobtainable  SUBJECTIVE:     He continues to be sedated with Precedex currently at 0.5 mcg.  He is very active in the bed, somewhat agitated, but he is not reporting any hallucinations.  He denies any pain other than in his head.  3% saline is in place.  VITAL SIGNS: BP 140/78   Pulse 70   Temp 98.5 F (36.9 C) (Axillary)   Resp (!) 29   Ht 6' (1.829 m)   Wt 211 lb 10.3 oz (96 kg)   SpO2 96%   BMI 28.70 kg/m   HEMODYNAMICS:    VENTILATOR SETTINGS:    INTAKE / OUTPUT: I/O last 3 completed shifts: In: 3985.3 [I.V.:3670.3; IV Piggyback:315] Out: 1900 [Urine:1900]  PHYSICAL EXAMINATION: General: Active and verbal.  He is perseverating about wanting his mittens off.   Neuro: Surprisingly oriented x2-1/2.  He does not know the exact date.   Moving all but the left upper extremity to noxious stimuli.  Right eye is prosthetic.  Left pupil is reactive. Cardiovascular: S1 and S2 are regular without murmur rub or gallop. Lungs: Respirations  are unlabored there is symmetric air movement and no wheezes. Abdomen: The abdomen is soft without any organomegaly masses tenderness guarding or rebound I specifically do not appreciate any splenomegaly.  He is anicteric  LABS:  BMET Recent Labs  Lab 05/08/17 1641 05/12/17 2013 05/13/17 0223 05/14/17 0443  05/14/17 1507 05/14/17 2111 05/15/17 0319  NA 139 139 137 135   < > 140 143 142  K 3.6 3.5  3.0* 3.6  --   --   --   --   CL 104 100* 104 104  --   --   --   --   CO2 24  --  20* 22  --   --   --   --   BUN <5* 5* <5* <5*  --   --   --   --   CREATININE 0.67 1.00 0.67 0.61  --   --   --   --   GLUCOSE 210* 173* 184* 125*  --   --   --   --    < > = values in this interval not displayed.    Electrolytes Recent Labs  Lab 05/08/17 1641 05/13/17 0223 05/14/17 0443 05/15/17 0319  CALCIUM 8.2* 7.7* 7.6*  --   MG  --   --  1.4* 1.5*  PHOS  --   --  2.3* 2.4*    CBC Recent Labs  Lab 05/13/17 0223 05/14/17 0443  05/15/17 0319  WBC 9.0 6.4 5.2  HGB 10.2* 8.8* 9.2*  HCT 29.6* 26.7* 28.0*  PLT 123* 112* 116*    Coag's Recent Labs  Lab 05/13/17 0501 05/15/17 0319  APTT 34  --   INR 1.23 1.38    Sepsis Markers No results for input(s): LATICACIDVEN, PROCALCITON, O2SATVEN in the last 168 hours.  ABG No results for input(s): PHART, PCO2ART, PO2ART in the last 168 hours.  Liver Enzymes Recent Labs  Lab 05/08/17 1641 05/13/17 0223  AST 72* 91*  ALT 30 31  ALKPHOS 223* 224*  BILITOT 1.5* 2.0*  ALBUMIN 2.7* 2.6*    Cardiac Enzymes No results for input(s): TROPONINI, PROBNP in the last 168 hours.  Glucose Recent Labs  Lab 05/14/17 0823 05/14/17 1227 05/14/17 1617 05/14/17 2038 05/15/17 0051 05/15/17 0415  GLUCAP 130* 119* 123* 115* 122* 154*    Imaging No results found.    DISCUSSION:      This is a 52 year old alcoholic who has suffered from a right frontal temporal depressed skull fracture with underlying cerebral contusion.  His only other known injury is a avulsion fracture of the left arm.  He is receiving 3% saline for control of intracranial hypertension and Precedex for control of alcohol withdrawal  ASSESSMENT / PLAN:  PULMONARY A: No active issues  CARDIOVASCULAR A: No active issues    GASTROINTESTINAL A: Prophylaxis is with Protonix  HEMATOLOGIC A: Platelet count is dropping but acceptable at 116,000 today.  Continues folate, INR is marginally elevated, he received a dose of vitamin K on 2/23  A: No active issues  NEUROLOGIC A: He is agitated today but not hallucinating.  I am going to continue to use Precedex as her sole agent for control of withdrawal at this point as I do not want to introduce confusion in evaluating his mental status by administering benzodiazepines.  He continues on 3% saline for control of edema related to his cerebral contusion.  He is much brighter today.  Penny Pia, MD [facility] Pager: [phone]  05/15/2017, 7:31 AM

## 2017-05-16 ENCOUNTER — Inpatient Hospital Stay (HOSPITAL_COMMUNITY): Payer: Medicaid Other

## 2017-05-16 DIAGNOSIS — N183 Chronic kidney disease, stage 3 unspecified: Secondary | ICD-10-CM

## 2017-05-16 DIAGNOSIS — S065X9A Traumatic subdural hemorrhage with loss of consciousness of unspecified duration, initial encounter: Secondary | ICD-10-CM

## 2017-05-16 DIAGNOSIS — S069X9D Unspecified intracranial injury with loss of consciousness of unspecified duration, subsequent encounter: Secondary | ICD-10-CM

## 2017-05-16 DIAGNOSIS — D62 Acute posthemorrhagic anemia: Secondary | ICD-10-CM

## 2017-05-16 DIAGNOSIS — S065XAA Traumatic subdural hemorrhage with loss of consciousness status unknown, initial encounter: Secondary | ICD-10-CM

## 2017-05-16 DIAGNOSIS — I10 Essential (primary) hypertension: Secondary | ICD-10-CM

## 2017-05-16 DIAGNOSIS — D638 Anemia in other chronic diseases classified elsewhere: Secondary | ICD-10-CM

## 2017-05-16 DIAGNOSIS — G9340 Encephalopathy, unspecified: Secondary | ICD-10-CM

## 2017-05-16 DIAGNOSIS — E876 Hypokalemia: Secondary | ICD-10-CM

## 2017-05-16 DIAGNOSIS — F10231 Alcohol dependence with withdrawal delirium: Secondary | ICD-10-CM

## 2017-05-16 DIAGNOSIS — S0633AA Contusion and laceration of cerebrum, unspecified, with loss of consciousness status unknown, initial encounter: Secondary | ICD-10-CM

## 2017-05-16 DIAGNOSIS — F1010 Alcohol abuse, uncomplicated: Secondary | ICD-10-CM

## 2017-05-16 DIAGNOSIS — S06339A Contusion and laceration of cerebrum, unspecified, with loss of consciousness of unspecified duration, initial encounter: Secondary | ICD-10-CM

## 2017-05-16 LAB — GLUCOSE, CAPILLARY
GLUCOSE-CAPILLARY: 139 mg/dL — AB (ref 65–99)
GLUCOSE-CAPILLARY: 130 mg/dL — AB (ref 65–99)
Glucose-Capillary: 124 mg/dL — ABNORMAL HIGH (ref 65–99)
Glucose-Capillary: 232 mg/dL — ABNORMAL HIGH (ref 65–99)
Glucose-Capillary: 147 mg/dL — ABNORMAL HIGH (ref 65–99)
Glucose-Capillary: 149 mg/dL — ABNORMAL HIGH (ref 65–99)

## 2017-05-16 LAB — SODIUM
SODIUM: 148 mmol/L — AB (ref 135–145)
Sodium: 147 mmol/L — ABNORMAL HIGH (ref 135–145)

## 2017-05-16 LAB — CBC WITH DIFFERENTIAL/PLATELET
BASOS ABS: 0 10*3/uL (ref 0.0–0.1)
BASOS PCT: 0 %
EOS ABS: 0 10*3/uL (ref 0.0–0.7)
EOS PCT: 1 %
HCT: 27.5 % — ABNORMAL LOW (ref 39.0–52.0)
Hemoglobin: 8.9 g/dL — ABNORMAL LOW (ref 13.0–17.0)
Lymphocytes Relative: 24 %
Lymphs Abs: 1.2 10*3/uL (ref 0.7–4.0)
MCH: 33.8 pg (ref 26.0–34.0)
MCHC: 32.4 g/dL (ref 30.0–36.0)
MCV: 104.6 fL — AB (ref 78.0–100.0)
MONO ABS: 0.5 10*3/uL (ref 0.1–1.0)
Monocytes Relative: 9 %
Neutro Abs: 3.4 10*3/uL (ref 1.7–7.7)
Neutrophils Relative %: 66 %
PLATELETS: 135 10*3/uL — AB (ref 150–400)
RBC: 2.63 MIL/uL — AB (ref 4.22–5.81)
RDW: 14.5 % (ref 11.5–15.5)
WBC: 5.2 10*3/uL (ref 4.0–10.5)

## 2017-05-16 LAB — BASIC METABOLIC PANEL
Anion gap: 5 (ref 5–15)
BUN: 7 mg/dL (ref 6–20)
CALCIUM: 7.9 mg/dL — AB (ref 8.9–10.3)
CO2: 23 mmol/L (ref 22–32)
CREATININE: 0.56 mg/dL — AB (ref 0.61–1.24)
Chloride: 117 mmol/L — ABNORMAL HIGH (ref 101–111)
GFR calc Af Amer: >60 mL/min (ref >60)
Glucose, Bld: 144 mg/dL — ABNORMAL HIGH (ref 65–99)
POTASSIUM: 3.3 mmol/L — AB (ref 3.5–5.1)
SODIUM: 145 mmol/L (ref 135–145)

## 2017-05-16 LAB — MAGNESIUM: Magnesium: 1.8 mg/dL (ref 1.7–2.4)

## 2017-05-16 LAB — PHOSPHORUS: PHOSPHORUS: 2.6 mg/dL (ref 2.5–4.6)

## 2017-05-16 MED ORDER — PNEUMOCOCCAL VAC POLYVALENT 25 MCG/0.5ML IJ INJ
0.5000 mL | INJECTION | INTRAMUSCULAR | Status: AC
  Administered 2017-05-18: 0.5 mL via INTRAMUSCULAR
  Filled 2017-05-16: qty 0.5

## 2017-05-16 MED ORDER — RESOURCE THICKENUP CLEAR PO POWD
Freq: Once | ORAL | Status: AC
  Administered 2017-05-16: 15:00:00 via ORAL
  Filled 2017-05-16: qty 125

## 2017-05-16 MED ORDER — POTASSIUM CHLORIDE 20 MEQ/15ML (10%) PO SOLN
40.0000 meq | Freq: Once | ORAL | Status: AC
  Administered 2017-05-16: 40 meq via ORAL
  Filled 2017-05-16: qty 30

## 2017-05-16 NOTE — Evaluation (Signed)
Occupational Therapy Evaluation Patient Details Name: [name] MRN: [id number] DOB: 09/17/1965 Today's Date: 05/16/2017    History of Present Illness Patient is a 52 yo male, chronic alcoholic admitted after being found face down with evidence of head injury.  CT of head showed acute depressed Rt fronto-temporal skull fx with frontotemporal confluent acute hemorrhagic contusions, small Lt frontal lobe contusion, 6mm Rt > Lt midline shift with Rt uncal herniation, Rt to Lt ventricular entrapments as well ass R-IVH, SAH, and B-SDH.   He was also found ot have questionable of cortical avulsion fracture of dorsal aspect of the Lt olecranon possible at insertion of triceps tendon (Lt UE placed in sling).  He was started on hypertonic saline.  He was treated for Thrombocytopenia and hypokalemia due to chronic ETOH abuse.  He has h/o frequent ED visits and hospital admissions due to ETOH abuse.  .CKD, HTN, AAA.  DM, h/o prior traumatic SAH    Clinical Impression   Pt admitted with above. He demonstrates the below listed deficits and will benefit from continued OT to maximize safety and independence with BADLs.  Pt presents to OT with behaviors consistent with [place] level V (confused, inappropriate).  He is not fully oriented to situation, and thinks he's at [facility].  He is internally distracted and perseverates on the desire for water.  He requires max - total A for ADLs, and total A for basic mobility.  He initially requires total A +2 for EOB sitting, as he pushes heavily to the Lt.  He progressed to mod A for brief periods. He reports he was fully independent PTA.  He lives alone, with limited supports.  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   Feel he would benefit from CIR.  Will follow acutely.       Follow Up Recommendations  CIR    Equipment Recommendations  None recommended by OT    Recommendations for Other Services Rehab consult     Precautions /  Restrictions Precautions Precautions: Fall Precaution Comments: TBI Required Braces or Orthoses: Sling Restrictions Weight Bearing Restrictions: Yes LUE Weight Bearing: Non weight bearing      Mobility Bed Mobility Overal bed mobility: Needs Assistance Bed Mobility: Supine to Sit;Sit to Supine     Supine to sit: +2 for physical assistance;Total assist Sit to supine: +2 for physical assistance;Total assist   General bed mobility comments: assist for all aspects.  with max cues, he was able to assist with moving Rt LE toward EOB, but requires extensive assist for all other aspects.  He pushes heavily to Lt and posteriorly    Transfers                 General transfer comment: unable to safely attempt     Balance Overall balance assessment: Needs assistance Sitting-balance support: Feet supported Sitting balance-Leahy Scale: Poor Sitting balance - Comments: Pt initially requiring total A +2 to maintain EOB sitting as he pushes heavily to the Lt and posteriorly.  With cues and facilitation, he was able to progress to periods of mod A to maintain static sitting   Postural control: Left lateral lean;Posterior lean     Standing balance comment: did not attempt                            ADL either performed or assessed with clinical judgement   ADL Overall ADL's : Needs assistance/impaired Eating/Feeding:  NPO   Grooming: Oral care;Sitting;Maximal assistance Grooming Details (indicate cue type and reason): Pt requires assist to initiate then hand over hand assist to complete task due to attentional deficits  Upper Body Bathing: Total assistance;Sitting;Bed level   Lower Body Bathing: Total assistance;Bed level   Upper Body Dressing : Total assistance;Bed level   Lower Body Dressing: Total assistance;Bed level   Toilet Transfer: Total assistance Toilet Transfer Details (indicate cue type and reason): unable to attempt  Toileting- Clothing Manipulation and  Hygiene: Total assistance;Bed level       Functional mobility during ADLs: Maximal assistance;+2 for physical assistance(bed mobility only )       Vision Baseline Vision/History: (prosthetic Rt eye ) Patient Visual Report: No change from baseline Additional Comments: Pt unable to participate in formal visual assessment due to attentional deficits.  He keeps eyes closed frequently.  He demonstrates down beating nystagmus Rt eye.  He was not able to accurately read clock, but with cues, was able to recognize the hands on the clock and their location      Perception Perception Perception Tested?: Yes Comments: difficult to accurately assess due to attentional deficits    Praxis Praxis Praxis tested?: Deficits Deficits: Initiation;Perseveration;Organization    Pertinent Vitals/Pain Pain Assessment: No/denies pain     Hand Dominance Right   Extremity/Trunk Assessment Upper Extremity Assessment Upper Extremity Assessment: RUE deficits/detail;LUE deficits/detail RUE Deficits / Details: Pt with difficulty initiating "brushing teeth" with swab, but able demonstrates full AROM when instructed to touch various body parts.    LUE Deficits / Details: Lt UE immobilized in sling.   He did not spontaneously extend or flex digits on commands, but he was fatigued at that point in time, and therefore, difficult to assess  LUE Coordination: decreased fine motor   Lower Extremity Assessment Lower Extremity Assessment: Defer to PT evaluation   Cervical / Trunk Assessment Cervical / Trunk Assessment: Other exceptions Cervical / Trunk Exceptions: Lt lateral lean.   Decreased trunk control    Communication Communication Communication: Expressive difficulties(perseverates )   Cognition Arousal/Alertness: Awake/alert Behavior During Therapy: Restless;Impulsive Overall Cognitive Status: Impaired/Different from baseline Area of Impairment: Orientation;Attention;Memory;Following  commands;Safety/judgement;Awareness;Problem solving               [place] Levels of Cognitive Functioning [facility] Scales of Cognitive Functioning: Confused/inappropriate/non-agitated Orientation Level: Disoriented to;Situation([facility], Feb 2019 ) Current Attention Level: Sustained(with cues ) Memory: Decreased recall of precautions;Decreased short-term memory Following Commands: Follows one step commands with increased time;Follows one step commands inconsistently Safety/Judgement: Decreased awareness of deficits;Decreased awareness of safety   Problem Solving: Slow processing;Decreased initiation;Difficulty sequencing;Requires verbal cues;Requires tactile cues General Comments: Pt perseverates on desire for H20.  He will follow one step commands ~75% of time with min - mod cues.  He self distracts frequently.  He demonstrate poor awareness of deficits and safety.  He demonstrated difficulty initiating moving swab to mouth to "brush teeth" - unsure if this was due to deficits with initiation, motor planning, or if due to distraction    General Comments  VSS     Exercises     Shoulder Instructions      Home Living Family/patient expects to be discharged to:: Private residence Living Arrangements: Alone Available Help at Discharge: Family Type of Home: House Home Access: Stairs to enter [employer] CorporationEntrance Stairs-Number of Steps: 6   Home Layout: One level               Home Equipment: None  Prior Functioning/Environment Level of Independence: Independent        Comments: Pt indicates that wife and teenaaged children left in the last 6-12 mos and 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  He reports he falls when he drinks, and per chart review, has had multiple ED visits and hospital admissions for falls and ETOH intoxication         OT Problem List: Decreased strength;Decreased range of motion;Decreased activity tolerance;Impaired balance (sitting  and/or standing);Impaired vision/perception;Decreased coordination;Decreased cognition;Decreased knowledge of use of DME or AE;Decreased safety awareness;Decreased knowledge of precautions;Impaired UE functional use      OT Treatment/Interventions: Self-care/ADL training;Therapeutic exercise;Neuromuscular education;Energy conservation;DME and/or AE instruction;Manual therapy;Therapeutic activities;Cognitive remediation/compensation;Splinting;Visual/perceptual remediation/compensation;Patient/family education;Balance training    OT Goals(Current goals can be found in the care plan section) Acute Rehab OT Goals Patient Stated Goal: to have some water  OT Goal Formulation: Patient unable to participate in goal setting Time For Goal Achievement: 05/30/17 Potential to Achieve Goals: Good ADL Goals Pt Will Perform Eating: with min assist;sitting Pt Will Perform Grooming: with min assist;sitting Pt Will Perform Upper Body Bathing: with mod assist;sitting Pt Will Transfer to Toilet: with mod assist;with +2 assist;bedside commode Additional ADL Goal #1: Pt will sustain attention to simple ADL task x 4 mins with min cues Additional ADL Goal #2: Pt will demonstrates intellectual awareness of deficits  OT Frequency: Min 3X/week   Barriers to D/C: Decreased caregiver support          Co-evaluation              AM-PAC PT "6 Clicks" Daily Activity     Outcome Measure Help from another person eating meals?: None Help from another person taking care of personal grooming?: A Lot Help from another person toileting, which includes using toliet, bedpan, or urinal?: Total Help from another person bathing (including washing, rinsing, drying)?: Total Help from another person to put on and taking off regular upper body clothing?: Total Help from another person to put on and taking off regular lower body clothing?: Total 6 Click Score: 10   End of Session Equipment Utilized During Treatment: Other  (comment)(sling ) Nurse Communication: Mobility status  Activity Tolerance: Patient tolerated treatment well Patient left: in bed;with call bell/phone within reach;with bed alarm set  OT Visit Diagnosis: Unsteadiness on feet (R26.81);Cognitive communication deficit (R41.841) Symptoms and signs involving cognitive functions: (TBI )                Time: 1610-9604 OT Time Calculation (min): 34 min Charges:  OT General Charges $OT Visit: 1 Visit OT Evaluation $OT Eval Moderate Complexity: 1 Mod G-Codes:     [employer], OTR/L [phone]   Jeani Hawking M 05/16/2017, 4:25 PM

## 2017-05-16 NOTE — Progress Notes (Addendum)
PULMONARY / CRITICAL CARE MEDICINE   Name: [name] MRN: [id number] DOB: [birth year]    ADMISSION DATE:  05/13/2017  BRIEF SUMMARY:  52 y.o. male with PMH including EtOH abuse.  He presented to [facility] ED 2/22 with a head injury.  Came to ED night prior via EMS due to AMS and was found to have EtOH level of 277.  He did not appear to have any acute injuries and after monitoring in ED for several hours & was discharged home. He returned to ED early AM 2/22 and appeared to have head injury.  Head CT demonstrated acute depressed right frontotemoporal skull fx, right frontotemporal hemorrhagic contusions, small left frontal lobe hemorrhagic contusion, right uncal herniation with 6mm R to L ventricular entratpment, right lateral intraventricular hemorrhage, small volume SAH, small acute bilateral SDH's. Left humerus xray demonstrated probable small cortical avulsion fx.  He was admitted by neurosurgery and PCCM was asked to see in consultation.  SUBJECTIVE: RN reports patient improvement in mental status.  Pt off precedex.  Calm, asking for juice.  Remains on 3%.    VITAL SIGNS: BP (!) 169/94   Pulse 72   Temp 98.7 F (37.1 C) (Axillary)   Resp 18   Ht 6' (1.829 m)   Wt 211 lb 10.3 oz (96 kg)   SpO2 100%   BMI 28.70 kg/m   HEMODYNAMICS:    VENTILATOR SETTINGS:    INTAKE / OUTPUT: I/O last 3 completed shifts: In: 3170.2 [I.V.:2960.2; IV Piggyback:210] Out: 2740 [Urine:2740]  PHYSICAL EXAMINATION: General: adult male in NAD, lying in bed  HEENT: MM pink/dry, no jvd, bruising to right side of face, swelling, bruising to right eye  Neuro: awake, alert, follows commands  CV: s1s2 rrr, no m/r/g PULM: even/non-labored, lungs bilaterally clear  OZ:DGUY, non-tender, bsx4 active  Extremities: warm/dry, no edema, L arm in sling  Skin: no rashes or lesions   LABS:  BMET Recent Labs  Lab 05/14/17 0443  05/15/17 0936 05/15/17 1515 05/15/17 2031 05/16/17 0342  NA 135   < > 144  145 145 145  K 3.6  --  3.6  --   --  3.3*  CL 104  --  116*  --   --  117*  CO2 22  --  21*  --   --  23  BUN <5*  --  7  --   --  7  CREATININE 0.61  --  0.64  --   --  0.56*  GLUCOSE 125*  --  145*  --   --  144*   < > = values in this interval not displayed.    Electrolytes Recent Labs  Lab 05/14/17 0443 05/15/17 0319 05/15/17 0936 05/16/17 0342  CALCIUM 7.6*  --  7.8* 7.9*  MG 1.4* 1.5*  --  1.8  PHOS 2.3* 2.4*  --  2.6    CBC Recent Labs  Lab 05/14/17 0443 05/15/17 0319 05/16/17 0342  WBC 6.4 5.2 5.2  HGB 8.8* 9.2* 8.9*  HCT 26.7* 28.0* 27.5*  PLT 112* 116* 135*    Coag's Recent Labs  Lab 05/13/17 0501 05/15/17 0319  APTT 34  --   INR 1.23 1.38    Sepsis Markers No results for input(s): LATICACIDVEN, PROCALCITON, O2SATVEN in the last 168 hours.  ABG No results for input(s): PHART, PCO2ART, PO2ART in the last 168 hours.  Liver Enzymes Recent Labs  Lab 05/13/17 0223  AST 91*  ALT 31  ALKPHOS 224*  BILITOT 2.0*  ALBUMIN 2.6*    Cardiac Enzymes No results for input(s): TROPONINI, PROBNP in the last 168 hours.  Glucose Recent Labs  Lab 05/15/17 0051 05/15/17 0415 05/15/17 0805 05/15/17 1234 05/15/17 1626 05/15/17 2040  GLUCAP 122* 154* 128* 213* 129* 130*    Imaging No results found.  EVENTS  2/22  Admit with depressed skull fracture  2/24  On precedex 0.5, intermittent agitation.  3% NS   STUDIES 2/22  CT Head / Neck >> no acute neck fractures, motion degraded exam, acute depressed right frontotemporal skull fracture, R frontotemporal confluent large hemorrhagic contusions, small left frontal lobe hemorrhagic contusion, right uncal herniation & 6mm R to L ventricular entrapment, R lateral intraventricular hemorrhage, small volume acute subarachnoid hemorrhage. 2/23  CT Head >> similar confluent R frontotemporal hemorrhagic contusions, small L frontal lobe hemorrhagic contusion, 8 mm R to L midline shift, right uncal herniation,  small volume subarachnoid hemorrhage with similar bilateral small holohemispheric subdural hematomas & 5 mm falcotentorial subdural hematoma, redemonstration of depressed right frontotemporal skull fracture, nondisplaced L frontoparietal skull fracture  DISCUSSION: 52 year old alcoholic male who has suffered from a right frontal temporal depressed skull fracture with underlying cerebral contusion.  His only other known injury is a avulsion fracture of the left arm.  He is receiving 3% saline for control of intracranial hypertension and Precedex for control of alcohol withdrawal.  ASSESSMENT / PLAN:  PULMONARY A:  No active issues P:  Aspiration precautions Oral care SLP following for diet   CARDIOVASCULAR A:  No active issues   P: ICU monitoring  PRN labetalol for SBP > 170  RENAL A: Hypokalemia  P: KCL IV  Trend BMP / urinary output Replace electrolytes as indicated Avoid nephrotoxic agents, ensure adequate renal perfusion  GASTROINTESTINAL A:  Stress Ulcer Prophylaxis  P: Continue PPI for now  HEMATOLOGIC A:  Thrombocytopenia  P: Continue folate Monitor INR   NEUROLOGIC A: Depressed Skull Fracture  ICH with 8 mm R to L Shift  Small SAH  ETOH Abuse  Agitated Delirium / ETOH Withdrawal  P: Continue precedex gtt as needed for withdrawal symptoms 3%NS per NSGY / Neurology @ 50 ml/hr  Trend Na Q6  Continue keppra  Frequent reorientation  Monitor for withdrawal symptoms, continue precedex, minimize ativan / sedating medications as able  Continue folate, thiamine CIR consulted   ORTHO A: Small Cortical Humerus L Avulsion Fracture  P: Continue sling / immobilization   CC Time: 30 minutes   Canary BrimBrandi Ollis, NP-C [facility] Pgr: [phone] or if no answer [phone] 05/16/2017, 8:10 AM

## 2017-05-16 NOTE — Progress Notes (Signed)
Modified Barium Swallow Progress Note  Patient Details  Name: [name] MRN: [id number] Date of Birth: 1965-08-25  Today's Date: 05/16/2017  Modified Barium Swallow completed.  Full report located under Chart Review in the Imaging Section.  Brief recommendations include the following:  Clinical Impression  Pt presents with a mild-moderate oropharyngeal dysphagia marked by impaired sensorimotor function with decreased oral coordination, left sided residue post-swallow with anterior spillage.  Pharyngeal swallow triggers at the level of the pyriform sinuses for thin and nectar liquids, with immediate spilling into the laryngeal vestibule and moderate aspiration before the swallow occurs.  Aspiration is sensed - accompanied by a consistent cough response.  When pt tucks chin, he is able to protect his airway well with nectar thick liquids.  There is good pharyngeal clearance - no residue post -swallow.  For now, recommend initiating a dysphagia 1 diet with nectar thick liquids.  He MUST BE CUED to tuck his chin due to impulsivity/cognitive deficits.  He may use straws.  SLP will f/u for safety/diet progression.    Swallow Evaluation Recommendations       SLP Diet Recommendations: Dysphagia 1 (Puree) solids;Nectar thick liquid MUST TUCK CHIN WITH LIQUIDS   Liquid Administration via: Straw   Medication Administration: Whole meds with puree   Supervision: Staff to assist with self feeding;Full supervision/cueing for compensatory strategies   Compensations: Chin tuck;Slow rate   Postural Changes: Seated upright at 90 degrees   Oral Care Recommendations: Oral care BID   Other Recommendations: Order thickener from pharmacy    Blenda Mountsouture, Zeynep Fantroy Laurice 05/16/2017,2:55 PM

## 2017-05-16 NOTE — Progress Notes (Signed)
Inpatient Rehabilitation  Called son, Dannielle HuhDanny at [phone] per chart in an attempt to discuss team's recommendation for IP Rehab, anticipated outcomes, and needed level of support for discharge home; however, number is temporary out of services.  Will continue to follow along.  Call if questions.    Charlane FerrettiMelissa Eldridge Marcott, M.A., CCC/SLP Admission Coordinator  [facility]  Cell [phone]

## 2017-05-16 NOTE — Progress Notes (Signed)
  Speech Language Pathology Treatment: Dysphagia  Patient Details Name: Christopher ReichertManuel Shaffer MRN: [id number] DOB: 09-11-1965 Today's Date: 05/16/2017 Time: [id number] SLP Time Calculation (min) (ACUTE ONLY): 22 min  Assessment / Plan / Recommendation Clinical Impression  Pt with impulsivity, perseverative output, tangential thought processes.  Continues to demonstrate overt s/s of aspiration with all tested consistencies - ice chips, nectar thick liquids, purees - with delayed but explosive coughing post-swallow, wet/congested phonation, oral pocketing on left.  Recommend proceeding with MBS today to determine swallow function and safest initial diet.  Spoke with RN, who agrees.    HPI HPI: Patient is a 52 yo male, chronic alcoholic with right ICH, small SDH and skull fracture with right to left shift increased from 6 to 8mm per CT 05/14/17. Thrombocytopenia with likely poorly functioning platelets. Left hemiparesis with left neglect and loss of vision on left.      SLP Plan  Continue with current plan of care;MBS       Recommendations  Diet recommendations: NPO                Oral Care Recommendations: Oral care QID Plan: Continue with current plan of care;MBS       GO                [name], [name] 05/16/2017, 11:27 AM

## 2017-05-16 NOTE — Consult Note (Signed)
Physical Medicine and Rehabilitation Consult   Reason for Consult: TBI Referring Physician: Dr. Venetia MaxonStern.    HPI: [name] is a 52 y.o. male with history of CKD, HTN, AAA, alcohol abuse with multiple ED visits and was initially seen in ED on 2/22 after found lying in front of his home with bottle of liquor--ETOH level 277.  He was treated with fluid bolus and discharged to home as work up negative.  He was readmitted a couple of hours later on 2/22 after being found face down at the [place] with evidence of head injury. CT head reviewed, showing skull fracture and bleed.  Per report, acute depressed right fronto-temporal skull fracture with right frontotemporal confluent acute hemorrhagic contusions, small left frontal lobe contusion, 6 mm right to left midline shift with right uncal herniation, right to left ventricular entrapment as well as R-IVH, SAH and B-SDH.  Right globe prosthesis seen.  He was found to have left neglect, left facial droop and left hemiparesis.   He had increase in lethargy and follow up CT head showed increase in midline shift to 8mm. He was started on hypertonic saline with improvement in mentation. Left humerus X ray showed question of cortical avulsion fracture of dorsal aspect of olecranon possibly at insertion fo triceps tendon--LUE placed in a sling.  On precedex to prevent alcohol withdrawal. He was treated for hypokalemia and anemia/thrombocytopenia felt to be due to chronic alcohol use. Diet limited to honey by tsp due to cognition as well as signs of aspiration. Patient with behaviors consistent with RLAS VI and CIR recommended due to TBI.    Review of Systems  Unable to perform ROS: Mental acuity      Past Medical History:  Diagnosis Date  . Abdominal aneurysm [facility](HCC)    Patient reported   . Chronic kidney disease, stage III (moderate) (HCC)    Deterding  . Hypertension   . Obesity (BMI 30-39.9)   . Other and unspecified hyperlipidemia   .  Type II or unspecified type diabetes mellitus without mention of complication, not stated as uncontrolled     Past Surgical History:  Procedure Laterality Date  . stomach vessel aneurysm     Patient reported     Family History  Problem Relation Age of Onset  . Diabetes Mellitus II Mother     Social History:  Married but separated? 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 Estranged from wife and children who "live around somewhere". He reports that he is a non-smoker but has been exposed to tobacco smoke. He has never used smokeless tobacco. He reports that he drinks about 7.2 oz of alcohol per week. He reports that he does not use drugs.    Allergies: No Known Allergies    No medications prior to admission.    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 Functional Status:  Mobility: Bed Mobility Overal bed mobility: Needs Assistance Bed Mobility: Supine to Sit, Sit to Supine Supine to sit: Max assist, +2 for physical assistance, +2 for safety/equipment Sit to supine: Max assist, +2 for physical assistance, +2 for safety/equipment General bed mobility comments: Patient  with very minimal ability to engage and follow instruction for positioning. Assist for trunk control during elevation to upright and return to supine. Patient was able to bring RLE to EOB and assisted with return to bed.  Transfers General transfer comment: attempted but unable to perform, increased       ADL:    Cognition: Cognition Overall Cognitive Status: Impaired/Different from baseline Orientation Level:  Oriented X4 [email or web address] [email or web address] Scales of Cognitive Functioning: Confused/appropriate Cognition Arousal/Alertness: Awake/alert Behavior During Therapy: Restless, Impulsive Overall Cognitive Status: Impaired/Different from baseline Area of Impairment: Attention, Memory, Following commands, Safety/judgement, Awareness, Problem solving, Rancho level Current Attention Level: Sustained Memory: Decreased recall of precautions, Decreased short-term memory(patient with inability to recall information being provided) Following Commands: Follows one step commands with increased time Safety/Judgement: Decreased awareness of safety, Decreased awareness of deficits Awareness: Intellectual Problem Solving: Slow processing, Difficulty sequencing, Requires verbal cues, Requires tactile cues General Comments: patient extremely perseverative throughout session with inability to recall/carryover information from therapist. Questionable historian despite being alert and oreinted x4. Patient appears to have some confabulation at times.    Blood pressure (!) 169/94, pulse 72, temperature 98.7 F (37.1 C), temperature source Axillary, resp. rate 18, height 6' (1.829 m), weight 96 kg (211 lb 10.3 oz), SpO2 100 %. Physical Exam  Nursing note and vitals reviewed. Constitutional: He appears well-developed and well-nourished. No distress.  Lying in bed with bilateral mittens and waist belt. Calling out constantly. Perseverated  on being thirst and wanting water (nurse had already been in multiple times)   HENT:  Head: Normocephalic.  Right forehead contusion noted.   Eyes: Right eye exhibits no discharge. Left eye exhibits no discharge.  Right ptosis. EOMI in left eye.   Neck: Normal range of motion. Neck supple.  Cardiovascular: Normal rate and regular rhythm.  Respiratory: Effort normal and breath sounds normal. No stridor. No respiratory distress. He has no wheezes.  GI: Soft. Bowel sounds are normal. He exhibits  no distension. There is no tenderness.  Musculoskeletal: He exhibits no tenderness.  LUE with compressive dressing and immobilized with  sling.   Neurological: He is alert. A cranial nerve deficit is present.  Kept eyes turned to the left but was able to look to the right.  Right ptosis.  Left facial weakness.  Speech clear but perseverative needing redirection.   A&Ox1 He was able to follow simple one step motor command with cues.  Motor: RUE/RLE: 5/5 proximal to distal LLE: 2+/5 proximal to distal with extensor tone.  LUE: limited by sling, hand grip 4/5  Skin: Skin is warm and dry. He is not diaphoretic.  Psychiatric: His affect is inappropriate. His speech is tangential. Cognition and memory are impaired. He expresses impulsivity. He is inattentive.    Results for orders placed or performed during the hospital encounter of 05/13/17 (from the past 24 hour(s))  Basic metabolic panel     Status: Abnormal   Collection Time: 05/15/17  9:36 AM  Result Value Ref Range   Sodium 144 135 - 145 mmol/L   Potassium 3.6 3.5 - 5.1 mmol/L   Chloride 116 (H) 101 - 111 mmol/L   CO2 21 (L) 22 - 32 mmol/L   Glucose, Bld 145 (H) 65 - 99 mg/dL   BUN 7 6 - 20 mg/dL   Creatinine, Ser 1.61 0.61 - 1.24 mg/dL   Calcium 7.8 (L) 8.9 - 10.3 mg/dL   GFR calc non Af Amer >60 >60 mL/min   GFR calc Af  Amer >60 >60 mL/min   Anion gap 7 5 - 15  Glucose, capillary     Status: Abnormal   Collection Time: 05/15/17 12:34 PM  Result Value Ref Range   Glucose-Capillary 213 (H) 65 - 99 mg/dL   Comment 1 Notify RN    Comment 2 Document in Chart   Sodium     Status: None   Collection Time: 05/15/17  3:15 PM  Result Value Ref Range   Sodium 145 135 - 145 mmol/L  Glucose, capillary     Status: Abnormal   Collection Time: 05/15/17  4:26 PM  Result Value Ref Range   Glucose-Capillary 129 (H) 65 - 99 mg/dL   Comment 1 Notify RN   Sodium     Status: None   Collection Time: 05/15/17  8:31 PM  Result Value Ref Range    Sodium 145 135 - 145 mmol/L  Glucose, capillary     Status: Abnormal   Collection Time: 05/15/17  8:40 PM  Result Value Ref Range   Glucose-Capillary 130 (H) 65 - 99 mg/dL  Basic metabolic panel     Status: Abnormal   Collection Time: 05/16/17  3:42 AM  Result Value Ref Range   Sodium 145 135 - 145 mmol/L   Potassium 3.3 (L) 3.5 - 5.1 mmol/L   Chloride 117 (H) 101 - 111 mmol/L   CO2 23 22 - 32 mmol/L   Glucose, Bld 144 (H) 65 - 99 mg/dL   BUN 7 6 - 20 mg/dL   Creatinine, Ser 1.61 (L) 0.61 - 1.24 mg/dL   Calcium 7.9 (L) 8.9 - 10.3 mg/dL   GFR calc non Af Amer >60 >60 mL/min   GFR calc Af Amer >60 >60 mL/min   Anion gap 5 5 - 15  CBC with Differential/Platelet     Status: Abnormal   Collection Time: 05/16/17  3:42 AM  Result Value Ref Range   WBC 5.2 4.0 - 10.5 K/uL   RBC 2.63 (L) 4.22 - 5.81 MIL/uL   Hemoglobin 8.9 (L) 13.0 - 17.0 g/dL   HCT 09.6 (L) 04.5 - 40.9 %   MCV 104.6 (H) 78.0 - 100.0 fL   MCH 33.8 26.0 - 34.0 pg   MCHC 32.4 30.0 - 36.0 g/dL   RDW 81.1 91.4 - 78.2 %   Platelets 135 (L) 150 - 400 K/uL   Neutrophils Relative % 66 %   Neutro Abs 3.4 1.7 - 7.7 K/uL   Lymphocytes Relative 24 %   Lymphs Abs 1.2 0.7 - 4.0 K/uL   Monocytes Relative 9 %   Monocytes Absolute 0.5 0.1 - 1.0 K/uL   Eosinophils Relative 1 %   Eosinophils Absolute 0.0 0.0 - 0.7 K/uL   Basophils Relative 0 %   Basophils Absolute 0.0 0.0 - 0.1 K/uL  Phosphorus     Status: None   Collection Time: 05/16/17  3:42 AM  Result Value Ref Range   Phosphorus 2.6 2.5 - 4.6 mg/dL  Magnesium     Status: None   Collection Time: 05/16/17  3:42 AM  Result Value Ref Range   Magnesium 1.8 1.7 - 2.4 mg/dL  Glucose, capillary     Status: Abnormal   Collection Time: 05/16/17  8:06 AM  Result Value Ref Range   Glucose-Capillary 124 (H) 65 - 99 mg/dL   No results found.  Assessment/Plan: Diagnosis: TBI with bleed Labs and images independently reviewed.  Records reviewed and summated above.  Ranch[place]  Amigos score:  IV  Speech to evaluate for Post traumatic amnesia and interval GOAT scores to assess progress.  NeuroPsych evaluation for behavorial assessment.  Provide environmental management by reducing the level of stimulation, tolerating restlessness when possible, protecting patient from harming self or others and reducing patient's cognitive confusion.  Address behavioral concerns include providing structured environments and daily routines.  Cognitive therapy to direct modular abilities in order to maintain goals  including problem solving, self regulation/monitoring, self management, attention, and memory.  Fall precautions; pt at risk for second impact syndrome  Prevention of secondary injury: monitor for hypotension, hypoxia, seizures or signs of increased ICP  Prophylactic AED:   Consider pharmacological intervention if necessary with neurostimulants,  Such as amantadine, methylphenidate, modafinil, etc.  Consider Propranolol for agitation and storming  Avoid medications that could impair cognitive abilities, such as anticholinergics, antihistaminic, benzodiazapines, narcotics, etc when possible  1. Does the need for close, 24 hr/day medical supervision in concert with the patient's rehab needs make it unreasonable for this patient to be served in a less intensive setting? Yes  2. Co-Morbidities requiring supervision/potential complications: CKD (avoid nephrotoxic meds), HTN (monitor and provide prns in accordance with increased physical exertion and pain), AAA, alcohol abuse (counsel when appropriate), hypokalemia (continue to monitor and replete as necessary), ABLA on anemia of chronic disease (transfuse if necessary to ensure appropriate perfusion for increased activity tolerance), wean hypertonic saline and precedex when appropriate 3. Due to bladder management, bowel management, safety, skin/wound care, disease management, medication administration, pain management and patient  education, does the patient require 24 hr/day rehab nursing? Yes 4. Does the patient require coordinated care of a physician, rehab nurse, PT (1-2 hrs/day, 5 days/week), OT (1-2 hrs/day, 5 days/week) and SLP (1-2 hrs/day, 5 days/week) to address physical and functional deficits in the context of the above medical diagnosis(es)? Yes Addressing deficits in the following areas: balance, endurance, locomotion, strength, transferring, bathing, dressing, toileting, cognition, speech, language, swallowing and psychosocial support 5. Can the patient actively participate in an intensive therapy program of at least 3 hrs of therapy per day at least 5 days per week? Potentially 6. The potential for patient to make measurable gains while on inpatient rehab is excellent 7. Anticipated functional outcomes upon discharge from inpatient rehab are supervision and min assist  with PT, supervision and min assist with OT, min assist with SLP. 8. Estimated rehab length of stay to reach the above functional goals is: 17-21 days. 9. Anticipated D/C setting: Other 10. Anticipated post D/C treatments: SNF 11. Overall Rehab/Functional Prognosis: good and fair  RECOMMENDATIONS: This patient's condition is appropriate for continued rehabilitative care in the following setting: CIR when medically stable Patient has agreed to participate in recommended program. Potentially Note that insurance prior authorization may be required for reimbursement for recommended care.  Comment: Rehab Admissions Coordinator to follow up.  Maryla Morrow, MD, ABPMR Jacquelynn Cree, PA-C 05/16/2017

## 2017-05-16 NOTE — Progress Notes (Signed)
Subjective: Patient reports awake, alert, conversant  Objective: Vital signs in last 24 hours: Temp:  [98 F (36.7 C)-98.7 F (37.1 C)] 98.7 F (37.1 C) (02/25 0804) Pulse Rate:  [58-94] 72 (02/25 0700) Resp:  [10-26] 18 (02/25 0700) BP: (127-169)/(72-105) 169/94 (02/25 0700) SpO2:  [91 %-100 %] 100 % (02/25 0700)  Intake/Output from previous day: 02/24 0701 - 02/25 0700 In: 1502.3 [I.V.:1502.3] Out: 2025 [Urine:2025] Intake/Output this shift: No intake/output data recorded.  Physical Exam: Oriented x 2.  More responsive than on admission.  Left hemiparesis is improving.  Antigravity in both arm and leg.  Swelling over right scalp improving.  Lab Results: Recent Labs    05/15/17 0319 05/16/17 0342  WBC 5.2 5.2  HGB 9.2* 8.9*  HCT 28.0* 27.5*  PLT 116* 135*   BMET Recent Labs    05/15/17 0936  05/15/17 2031 05/16/17 0342  NA 144   < > 145 145  K 3.6  --   --  3.3*  CL 116*  --   --  117*  CO2 21*  --   --  23  GLUCOSE 145*  --   --  144*  BUN 7  --   --  7  CREATININE 0.64  --   --  0.56*  CALCIUM 7.8*  --   --  7.9*   < > = values in this interval not displayed.    Studies/Results: No results found.  Assessment/Plan: Patient is improving clinically.  Continue on 3 % NaCl.  Advance diet as tolerated.  Proceed with Rehab admission when bed available.    LOS: 3 days    [name],Christopher Merkel D, MD 05/16/2017, 8:17 AM

## 2017-05-17 LAB — SODIUM
SODIUM: 148 mmol/L — AB (ref 135–145)
SODIUM: 144 mmol/L (ref 135–145)
Sodium: 149 mmol/L — ABNORMAL HIGH (ref 135–145)

## 2017-05-17 LAB — CBC
HEMATOCRIT: 30.8 % — AB (ref 39.0–52.0)
HEMOGLOBIN: 10 g/dL — AB (ref 13.0–17.0)
MCH: 34 pg (ref 26.0–34.0)
MCHC: 32.5 g/dL (ref 30.0–36.0)
MCV: 104.8 fL — AB (ref 78.0–100.0)
Platelets: 131 10*3/uL — ABNORMAL LOW (ref 150–400)
RBC: 2.94 MIL/uL — ABNORMAL LOW (ref 4.22–5.81)
RDW: 14.5 % (ref 11.5–15.5)
WBC: 5.2 10*3/uL (ref 4.0–10.5)

## 2017-05-17 LAB — GLUCOSE, CAPILLARY
GLUCOSE-CAPILLARY: 97 mg/dL (ref 65–99)
GLUCOSE-CAPILLARY: 121 mg/dL — AB (ref 65–99)
GLUCOSE-CAPILLARY: 103 mg/dL — AB (ref 65–99)
Glucose-Capillary: 114 mg/dL — ABNORMAL HIGH (ref 65–99)
Glucose-Capillary: 120 mg/dL — ABNORMAL HIGH (ref 65–99)
Glucose-Capillary: 130 mg/dL — ABNORMAL HIGH (ref 65–99)

## 2017-05-17 LAB — BASIC METABOLIC PANEL
ANION GAP: 7 (ref 5–15)
BUN: 5 mg/dL — ABNORMAL LOW (ref 6–20)
CALCIUM: 8.3 mg/dL — AB (ref 8.9–10.3)
CHLORIDE: 120 mmol/L — AB (ref 101–111)
CO2: 22 mmol/L (ref 22–32)
Creatinine, Ser: 0.66 mg/dL (ref 0.61–1.24)
GFR calc Af Amer: >60 mL/min (ref >60)
GFR calc non Af Amer: >60 mL/min (ref >60)
GLUCOSE: 108 mg/dL — AB (ref 65–99)
Potassium: 3.4 mmol/L — ABNORMAL LOW (ref 3.5–5.1)
Sodium: 149 mmol/L — ABNORMAL HIGH (ref 135–145)

## 2017-05-17 MED ORDER — POTASSIUM CHLORIDE 20 MEQ/15ML (10%) PO SOLN
40.0000 meq | Freq: Once | ORAL | Status: AC
  Administered 2017-05-17: 40 meq via ORAL
  Filled 2017-05-17: qty 30

## 2017-05-17 MED ORDER — SODIUM CHLORIDE 0.9 % IV SOLN
INTRAVENOUS | Status: DC
  Administered 2017-05-17 – 2017-05-18 (×2): via INTRAVENOUS

## 2017-05-17 NOTE — Evaluation (Signed)
Speech Language Pathology Evaluation Patient Details Name: Carlyn ReichertManuel Cuadros MRN: [id number] DOB: 04-14-1965 Today's Date: 05/17/2017 Time: [id number] SLP Time Calculation (min) (ACUTE ONLY): 12 min  Problem List:  Patient Active Problem List   Diagnosis Date Noted  . Focal hemorrhagic contusion of cerebrum (HCC)   . Subdural hematoma (HCC)   . Benign essential HTN   . Stage 3 chronic kidney disease (HCC)   . ETOH abuse   . Hypokalemia   . Acute blood loss anemia   . Anemia of chronic disease   . TBI (traumatic brain injury) (HCC) 05/13/2017  . Tachycardia   . Alcohol withdrawal (HCC) 04/03/2017  . DM2 (diabetes mellitus, type 2) (HCC) 04/03/2017  . Alcohol abuse with alcohol-induced mood disorder (HCC) 03/24/2017  . Frontal lobe contusion (HCC) 01/23/2017  . Encephalopathy acute   . Fall   . Subarachnoid hemorrhage following injury with brief loss of consciousness but without open intracranial wound (HCC) 07/17/2016  . Traumatic subarachnoid hemorrhage (HCC) 01/15/2015  . Alcohol intoxication (HCC) 01/15/2015  . SAH (subarachnoid hemorrhage) (HCC) 01/15/2015   Past Medical History:  Past Medical History:  Diagnosis Date  . Abdominal aneurysm [facility](HCC)    Patient reported   . Chronic kidney disease, stage III (moderate) (HCC)    Deterding  . Hypertension   . Obesity (BMI 30-39.9)   . Other and unspecified hyperlipidemia   . Type II or unspecified type diabetes mellitus without mention of complication, not stated as uncontrolled    Past Surgical History:  Past Surgical History:  Procedure Laterality Date  . stomach vessel aneurysm     Patient reported    HPI:  Patient is a 52 yo male, chronic alcoholic with right ICH, small SDH and skull fracture with right to left shift increased from 6 to 8mm per CT 05/14/17. Thrombocytopenia with likely poorly functioning platelets. Left hemiparesis with left neglect and loss of vision on left.   Assessment / Plan /  Recommendation Clinical Impression   Pt presents with significant cognitive disruption secondary to TBI and currently is demonstrating behaviors consistent with Rancho Level V.  Some emerging Rancho Level VI behaviors are present but not consistently.  Pt has fluctuating orientation and poor sustained attention to tasks.  Pt also has left inattention, decreased task initiation and sequencing as well as poor intellectual awareness of his deficits which negatively impacts all higher level cognitive processes.  Pt could follow 1-step commands with mod-max assist.  His speech was perseverative but clear and intelligible.  Language was not formally assessed; however, suspect cognition to be pt's primary limiting factor at this time.  As a result, pt would benefit from intensive skilled ST while inpatient for cognition as well as dysphagia.      SLP Assessment  SLP Recommendation/Assessment: Patient needs continued Speech Lanaguage Pathology Services SLP Visit Diagnosis: Attention and concentration deficit;Frontal lobe and executive function deficit;Cognitive communication deficit (R41.841)    Follow Up Recommendations  Inpatient Rehab    Frequency and Duration min 3x week         SLP Evaluation Cognition  Overall Cognitive Status: Impaired/Different from baseline Arousal/Alertness: Lethargic Orientation Level: Oriented to person;Oriented to place Attention: Sustained Sustained Attention: Impaired Sustained Attention Impairment: Verbal basic;Functional basic Memory: (suspect impaired due to lower level attention deficits) Awareness: Impaired Awareness Impairment: Intellectual impairment Problem Solving: Impaired Problem Solving Impairment: Functional basic;Verbal basic Executive Function: Initiating;Sequencing Sequencing: Impaired Sequencing Impairment: Functional basic Initiating: Impaired Initiating Impairment: Functional basic;Verbal basic Behaviors: Perseveration Safety/Judgment:  Impaired Rancho  Los DIRECTV Scales of Cognitive Functioning: Confused/inappropriate/non-agitated       Comprehension  Auditory Comprehension Overall Auditory Comprehension: Appears within functional limits for tasks assessed(impacted by cognition)    Expression Expression Primary Mode of Expression: Verbal Verbal Expression Overall Verbal Expression: Appears within functional limits for tasks assessed(impacted by cognition ) Written Expression Dominant Hand: Right   Oral / Motor  Oral Motor/Sensory Function Overall Oral Motor/Sensory Function: Moderate impairment Facial ROM: Reduced left Facial Symmetry: Abnormal symmetry left Facial Strength: Reduced left Facial Sensation: Within Functional Limits Lingual ROM: Within Functional Limits Lingual Symmetry: Within Functional Limits Motor Speech Overall Motor Speech: Appears within functional limits for tasks assessed   GO                    Maryjane Hurter 05/17/2017, 9:46 AM

## 2017-05-17 NOTE — Progress Notes (Signed)
Inpatient Rehabilitation  Met with patient at bedside to discuss team's recommendation for IP Rehab.  Shared booklets, insurance verification letter, and answered initial questions.  Patient requesting I call his son Denny at [phone]; however, number out of service. Patient stated that he is married and has 2 sons but lost the other numbers since his phone is lost.  In the best interest of the patient in order to reach next of kin searched Epic, found spouse, [name] Venuti's phone number listed as [phone].  Reached out to spouse she returned call and made her aware that he is currently hospitalized and she plans to visit.  I notified unit that I spoke with her and of her plans to visit.  Call if questions.     , M.A., CCC/SLP Admission Coordinator  [facility]  Cell [phone]  

## 2017-05-17 NOTE — Progress Notes (Signed)
Physical Therapy Treatment Patient Details Name: [name] MRN: [id number] DOB: 1965-09-04 Today's Date: 05/17/2017    History of Present Illness Patient is a 52 yo male, chronic alcoholic admitted after being found face down with evidence of head injury.  CT of head showed acute depressed Rt fronto-temporal skull fx with frontotemporal confluent acute hemorrhagic contusions, small Lt frontal lobe contusion, 6mm Rt > Lt midline shift with Rt uncal herniation, Rt to Lt ventricular entrapments as well ass R-IVH, SAH, and B-SDH.   He was also found ot have questionable of cortical avulsion fracture of dorsal aspect of the Lt olecranon possible at insertion of triceps tendon (Lt UE placed in sling).  He was started on hypertonic saline.  He was treated for Thrombocytopenia and hypokalemia due to chronic ETOH abuse.  He has h/o frequent ED visits and hospital admissions due to ETOH abuse.  Other significant PMH includes CKD, HTN, AAA, DM, and h/o prior traumatic SAH     PT Comments    Patient seen for activity progression, remains total assist for all aspects of mobility (2-3 persons). Heavy pushing with activity and inability to engage or follow commands appropriately this session. Will continue to monitor progression.   Follow Up Recommendations  CIR     Equipment Recommendations  Wheelchair cushion (measurements PT);Wheelchair (measurements PT)    Recommendations for Other Services Rehab consult     Precautions / Restrictions Precautions Precautions: Fall Precaution Comments: TBI Required Braces or Orthoses: Sling Restrictions Weight Bearing Restrictions: Yes LUE Weight Bearing: Non weight bearing    Mobility  Bed Mobility Overal bed mobility: Needs Assistance Bed Mobility: Supine to Sit;Sit to Supine     Supine to sit: +2 for physical assistance;Total assist Sit to supine: +2 for physical assistance;Total assist   General bed mobility comments: assist for all aspects  of mobility. Continues to push heavily to Lt and posteriorly.  modest spontaneous movement of right side, but unable to follow commands or engage this session.  Transfers Overall transfer level: Needs assistance Equipment used: (+3) Transfers: Sit to/from Stand Sit to Stand: Total assist;+2 physical assistance;+2 safety/equipment(+3)         General transfer comment: able to clear buttocks breifly but unable to reach upright  Ambulation/Gait                 Stairs            Wheelchair Mobility    Modified Rankin (Stroke Patients Only)       Balance Overall balance assessment: Needs assistance Sitting-balance support: Feet supported Sitting balance-Leahy Scale: Poor Sitting balance - Comments: Pt initially requiring total A +2 to maintain EOB sitting as he pushes heavily to the Lt and posteriorly.  With cues and facilitation, he was able to progress to periods of mod A to maintain static sitting.  It worked best today to have the pt between both therapist while seated EOB.   Postural control: Left lateral lean;Posterior lean     Standing balance comment: Could not attempt safely yet and pt was too restless to safely get OOB and leave up in the chair.                             Cognition Arousal/Alertness: Awake/alert Behavior During Therapy: Restless;Impulsive Overall Cognitive Status: Impaired/Different from baseline Area of Impairment: Orientation;Attention;Memory;Following commands;Safety/judgement;Awareness;Problem solving               [place]anch[place] Levels of  Cognitive Functioning [facility] Scales of Cognitive Functioning: Confused/inappropriate/non-agitated Orientation Level: Disoriented to;Situation([facility], Feb 2019 ) Current Attention Level: Sustained(with cues ) Memory: Decreased recall of precautions;Decreased short-term memory Following Commands: Follows one step commands with increased time;Follows one step commands  inconsistently Safety/Judgement: Decreased awareness of deficits;Decreased awareness of safety   Problem Solving: Slow processing;Decreased initiation;Difficulty sequencing;Requires verbal cues;Requires tactile cues General Comments: patient remains signficantly perseverative today. This session, patient perseverating on "getting my papers ready" patient unable to engage or participate with inctructional cues      Exercises      General Comments        Pertinent Vitals/Pain Pain Assessment: Faces Faces Pain Scale: Hurts even more Pain Location: back and L arm Pain Descriptors / Indicators: Grimacing Pain Intervention(s): Monitored during session;Repositioned    Home Living                      Prior Function            PT Goals (current goals can now be found in the care plan section) Acute Rehab PT Goals Patient Stated Goal: to have some water  PT Goal Formulation: With patient Time For Goal Achievement: 05/29/17 Potential to Achieve Goals: Fair Progress towards PT goals: Not progressing toward goals - comment    Frequency    Min 4X/week      PT Plan Current plan remains appropriate    Co-evaluation              AM-PAC PT "6 Clicks" Daily Activity  Outcome Measure  Difficulty turning over in bed (including adjusting bedclothes, sheets and blankets)?: Unable Difficulty moving from lying on back to sitting on the side of the bed? : Unable Difficulty sitting down on and standing up from a chair with arms (e.g., wheelchair, bedside commode, etc,.)?: Unable Help needed moving to and from a bed to chair (including a wheelchair)?: Total Help needed walking in hospital room?: Total Help needed climbing 3-5 steps with a railing? : Total 6 Click Score: 6    End of Session Equipment Utilized During Treatment: Other (comment)(L arm sling) Activity Tolerance: Other (comment) Patient left: in bed;with call bell/phone within reach;with bed alarm  set Nurse Communication: Mobility status PT Visit Diagnosis: Muscle weakness (generalized) (M62.81);History of falling (Z91.81);Difficulty in walking, not elsewhere classified (R26.2);Other symptoms and signs involving the nervous system (R29.898)     Time: [id number] PT Time Calculation (min) (ACUTE ONLY): 24 min  Charges:  $Therapeutic Activity: 23-37 mins                    G Codes:       Charlotte Crumbevon Keenan Trefry, PT DPT  Board Certified Neurologic Specialist [phone]    Fabio AsaDevon J Pierre Cumpton 05/17/2017, 1:40 PM

## 2017-05-17 NOTE — Progress Notes (Signed)
PULMONARY / CRITICAL CARE MEDICINE   Name: [name] MRN: [id number] DOB: 08-01-1965    ADMISSION DATE:  05/13/2017  BRIEF SUMMARY:  52 y.o. male with PMH including EtOH abuse.  He presented to [facility] ED 2/22 with a head injury.  Came to ED night prior via EMS due to AMS and was found to have EtOH level of 277.  He did not appear to have any acute injuries and after monitoring in ED for several hours & was discharged home. He returned to ED early AM 2/22 and appeared to have head injury.  Head CT demonstrated acute depressed right frontotemoporal skull fx, right frontotemporal hemorrhagic contusions, small left frontal lobe hemorrhagic contusion, right uncal herniation with 6mm R to L ventricular entratpment, right lateral intraventricular hemorrhage, small volume SAH, small acute bilateral SDH's. Left humerus xray demonstrated probable small cortical avulsion fx.  He was admitted by neurosurgery and PCCM was asked to see in consultation.  SUBJECTIVE:  RN reports precedex restarted overnight for agitation.  Pt very impulsive, attempts to get out of bed.  PT pending evaluation.    VITAL SIGNS: BP (!) 150/89   Pulse 80   Temp 100.1 F (37.8 C) (Oral)   Resp (!) 21   Ht 6' (1.829 m)   Wt 211 lb 10.3 oz (96 kg)   SpO2 97%   BMI 28.70 kg/m   HEMODYNAMICS:    VENTILATOR SETTINGS:    INTAKE / OUTPUT: I/O last 3 completed shifts: In: 3237.7 [P.O.:565; I.V.:2357.7; IV Piggyback:315] Out: 3570 [Urine:3570]  PHYSICAL EXAMINATION: General: adult male in NAD HEENT: MM pink/moist, right side of face with bruising / swelling (unchanged) PSY: calm Neuro: Awake, talks to self, asking for water & a shower, moves all extremities  CV: s1s2 rrr, no m/r/g PULM: even/non-labored, lungs bilaterally clear  NW:GNFA, non-tender, bsx4 active  Extremities: warm/dry, no edema, multiple bruises of varying stages of healing, abrasions Skin: no rashes or lesions  LABS:  BMET Recent Labs  Lab  05/15/17 0936  05/16/17 0342 05/16/17 1506 05/16/17 2049 05/17/17 0320  NA 144   < > 145 147* 148* 149*  K 3.6  --  3.3*  --   --  3.4*  CL 116*  --  117*  --   --  120*  CO2 21*  --  23  --   --  22  BUN 7  --  7  --   --  5*  CREATININE 0.64  --  0.56*  --   --  0.66  GLUCOSE 145*  --  144*  --   --  108*   < > = values in this interval not displayed.    Electrolytes Recent Labs  Lab 05/14/17 0443 05/15/17 0319 05/15/17 0936 05/16/17 0342 05/17/17 0320  CALCIUM 7.6*  --  7.8* 7.9* 8.3*  MG 1.4* 1.5*  --  1.8  --   PHOS 2.3* 2.4*  --  2.6  --     CBC Recent Labs  Lab 05/15/17 0319 05/16/17 0342 05/17/17 0320  WBC 5.2 5.2 5.2  HGB 9.2* 8.9* 10.0*  HCT 28.0* 27.5* 30.8*  PLT 116* 135* 131*    Coag's Recent Labs  Lab 05/13/17 0501 05/15/17 0319  APTT 34  --   INR 1.23 1.38    Sepsis Markers No results for input(s): LATICACIDVEN, PROCALCITON, O2SATVEN in the last 168 hours.  ABG No results for input(s): PHART, PCO2ART, PO2ART in the last 168 hours.  Liver Enzymes Recent  Labs  Lab 05/13/17 0223  AST 91*  ALT 31  ALKPHOS 224*  BILITOT 2.0*  ALBUMIN 2.6*    Cardiac Enzymes No results for input(s): TROPONINI, PROBNP in the last 168 hours.  Glucose Recent Labs  Lab 05/16/17 1206 05/16/17 1617 05/16/17 2000 05/17/17 0002 05/17/17 0324 05/17/17 0821  GLUCAP 232* 149* 130* 130* 97 121*    Imaging Dg Swallowing Func-speech Pathology  Result Date: 05/16/2017 Objective Swallowing Evaluation: Type of Study: MBS-Modified Barium Swallow Study  Patient Details Name: [name] MRN: [id number] Date of Birth: 1965-05-01 Today's Date: 05/16/2017 Time: SLP Start Time (ACUTE ONLY): 1340 -SLP Stop Time (ACUTE ONLY): 1410 SLP Time Calculation (min) (ACUTE ONLY): 30 min Past Medical History: Past Medical History: Diagnosis Date . Abdominal aneurysm [facility])   Patient reported  . Chronic kidney disease, stage III (moderate) (HCC)   Deterding . Hypertension   . Obesity (BMI 30-39.9)  . Other and unspecified hyperlipidemia  . Type II or unspecified type diabetes mellitus without mention of complication, not stated as uncontrolled  Past Surgical History: Past Surgical History: Procedure Laterality Date . stomach vessel aneurysm    Patient reported  HPI: Patient is a 52 yo male, chronic alcoholic with right ICH, small SDH and skull fracture with right to left shift increased from 6 to 8mm per CT 05/14/17. Thrombocytopenia with likely poorly functioning platelets. Left hemiparesis with left neglect and loss of vision on left.  Subjective: alert Assessment / Plan / Recommendation CHL IP CLINICAL IMPRESSIONS 05/16/2017 Clinical Impression Pt presents with a mild-moderate oropharyngeal dysphagia marked by impaired sensorimotor function with decreased oral coordination, left sided residue post-swallow with anterior spillage.  Pharyngeal swallow triggers at the level of the pyriform sinuses for thin and nectar liquids, with immediate spilling into the laryngeal vestibule and moderate aspiration before the swallow occurs.  Aspiration is sensed - accompanied by a consistent cough response.  When pt tucks chin, he is able to protect his airway well with nectar thick liquids.  There is good pharyngeal clearance - no residue post -swallow.  For now, recommend initiating a dysphagia 1 diet with nectar thick liquids.  He MUST BE CUED to tuck his chin due to impulsivity/cognitive deficits.  He may use straws.  SLP will f/u for safety/diet progression.  SLP Visit Diagnosis Dysphagia, oropharyngeal phase (R13.12) Attention and concentration deficit following -- Frontal lobe and executive function deficit following -- Impact on safety and function Moderate aspiration risk   CHL IP TREATMENT RECOMMENDATION 05/16/2017 Treatment Recommendations Therapy as outlined in treatment plan below   Prognosis 05/16/2017 Prognosis for Safe Diet Advancement Good Barriers to Reach Goals Cognitive deficits  Barriers/Prognosis Comment -- CHL IP DIET RECOMMENDATION 05/16/2017 SLP Diet Recommendations Dysphagia 1 (Puree) solids;Nectar thick liquid Liquid Administration via Straw Medication Administration Whole meds with puree Compensations Chin tuck;Slow rate Postural Changes Seated upright at 90 degrees   CHL IP OTHER RECOMMENDATIONS 05/16/2017 Recommended Consults -- Oral Care Recommendations Oral care BID Other Recommendations Order thickener from pharmacy   CHL IP FOLLOW UP RECOMMENDATIONS 05/16/2017 Follow up Recommendations Inpatient Rehab   CHL IP FREQUENCY AND DURATION 05/16/2017 Speech Therapy Frequency (ACUTE ONLY) min 3x week Treatment Duration 2 weeks      CHL IP ORAL PHASE 05/16/2017 Oral Phase Impaired Oral - Pudding Teaspoon -- Oral - Pudding Cup -- Oral - Honey Teaspoon -- Oral - Honey Cup -- Oral - Nectar Teaspoon -- Oral - Nectar Cup -- Oral - Nectar Straw Left anterior bolus loss;Weak lingual manipulation  Oral - Thin Teaspoon -- Oral - Thin Cup -- Oral - Thin Straw Left anterior bolus loss;Weak lingual manipulation Oral - Puree Left anterior bolus loss;Weak lingual manipulation;Left pocketing in lateral sulci Oral - Mech Soft -- Oral - Regular -- Oral - Multi-Consistency -- Oral - Pill -- Oral Phase - Comment --  CHL IP PHARYNGEAL PHASE 05/16/2017 Pharyngeal Phase Impaired Pharyngeal- Pudding Teaspoon -- Pharyngeal -- Pharyngeal- Pudding Cup -- Pharyngeal -- Pharyngeal- Honey Teaspoon -- Pharyngeal -- Pharyngeal- Honey Cup -- Pharyngeal -- Pharyngeal- Nectar Teaspoon -- Pharyngeal -- Pharyngeal- Nectar Cup -- Pharyngeal -- Pharyngeal- Nectar Straw Delayed swallow initiation-pyriform sinuses;Reduced airway/laryngeal closure;Penetration/Aspiration before swallow;Moderate aspiration;Compensatory strategies attempted (with notebox) Pharyngeal Material enters airway, passes BELOW cords and not ejected out despite cough attempt by patient Pharyngeal- Thin Teaspoon -- Pharyngeal -- Pharyngeal- Thin Cup Delayed  swallow initiation-pyriform sinuses;Reduced airway/laryngeal closure;Penetration/Aspiration before swallow;Moderate aspiration;Compensatory strategies attempted (with notebox) Pharyngeal Material enters airway, passes BELOW cords and not ejected out despite cough attempt by patient Pharyngeal- Thin Straw Delayed swallow initiation-pyriform sinuses;Reduced airway/laryngeal closure;Penetration/Aspiration before swallow;Moderate aspiration;Compensatory strategies attempted (with notebox) Pharyngeal Material enters airway, passes BELOW cords and not ejected out despite cough attempt by patient Pharyngeal- Puree Delayed swallow initiation-vallecula Pharyngeal -- Pharyngeal- Mechanical Soft -- Pharyngeal -- Pharyngeal- Regular -- Pharyngeal -- Pharyngeal- Multi-consistency -- Pharyngeal -- Pharyngeal- Pill -- Pharyngeal -- Pharyngeal Comment --  No flowsheet data found. No flowsheet data found. [name] 05/16/2017, 2:56 PM               EVENTS  2/22  Admit with depressed skull fracture  2/24  On precedex 0.5, intermittent agitation.  3% NS   STUDIES 2/22  CT Head / Neck >> no acute neck fractures, motion degraded exam, acute depressed right frontotemporal skull fracture, R frontotemporal confluent large hemorrhagic contusions, small left frontal lobe hemorrhagic contusion, right uncal herniation & 6mm R to L ventricular entrapment, R lateral intraventricular hemorrhage, small volume acute subarachnoid hemorrhage. 2/23  CT Head >> similar confluent R frontotemporal hemorrhagic contusions, small L frontal lobe hemorrhagic contusion, 8 mm R to L midline shift, right uncal herniation, small volume subarachnoid hemorrhage with similar bilateral small holohemispheric subdural hematomas & 5 mm falcotentorial subdural hematoma, redemonstration of depressed right frontotemporal skull fracture, nondisplaced L frontoparietal skull fracture  DISCUSSION: 52 year old alcoholic male who has suffered from a  right frontal temporal depressed skull fracture with underlying cerebral contusion.  His only other known injury is a avulsion fracture of the left arm.  He is receiving 3% saline for control of intracranial hypertension and Precedex for control of alcohol withdrawal.  ASSESSMENT / PLAN:  PULMONARY A:  No active issues P:  Aspiration precautions  Oral care SLP following, appreciate input   CARDIOVASCULAR A:  No active issues   P: ICU monitoring  PRN labetalol for SBP > 170   RENAL A: Hypokalemia  P: NS @ 54ml/hr until taking PO's reliably  Trend BMP / urinary output Replace electrolytes as indicated Avoid nephrotoxic agents, ensure adequate renal perfusion  GASTROINTESTINAL A:  Stress Ulcer Prophylaxis  P: PPI SLP following   HEMATOLOGIC A:  Thrombocytopenia  P: Continue folate, thiamine  Trend CBC  NEUROLOGIC A: Depressed Skull Fracture  ICH with 8 mm R to L Shift  Small SAH  ETOH Abuse  Agitated Delirium / ETOH Withdrawal  P: Precedex for agitated delirium  3% NS Discontinued 2/26 am  Monitor Na Continue keppra  Frequent reorientation / neuro checks Continue thiamine, folate as above  CIR consulted   ORTHO A: Small Cortical Humerus L Avulsion Fracture  P: Continue sling / immobilization    Canary BrimBrandi Aissata Wilmore, NP-C [facility] Pgr: [phone] or if no answer [phone] 05/17/2017, 10:14 AM

## 2017-05-17 NOTE — Progress Notes (Addendum)
Subjective: Patient reports "How are you today?"  Objective: Vital signs in last 24 hours: Temp:  [98.3 F (36.8 C)-99.8 F (37.7 C)] 98.7 F (37.1 C) (02/26 0400) Pulse Rate:  [60-98] 81 (02/26 0700) Resp:  [13-29] 23 (02/26 0700) BP: (117-178)/(74-106) 157/96 (02/26 0700) SpO2:  [95 %-100 %] 98 % (02/26 0700)  Intake/Output from previous day: 02/25 0701 - 02/26 0700 In: 2160.6 [P.O.:565; I.V.:1490.6; IV Piggyback:105] Out: 2820 [Urine:2820] Intake/Output this shift: No intake/output data recorded.  Awakens to voice. Responds to questions and follows commands with repetitive prompting. States name and location, denying pain or discomfort. Moves right side, not moving left. NA at 149 this am  Lab Results: Recent Labs    05/16/17 0342 05/17/17 0320  WBC 5.2 5.2  HGB 8.9* 10.0*  HCT 27.5* 30.8*  PLT 135* 131*   BMET Recent Labs    05/16/17 0342  05/16/17 2049 05/17/17 0320  NA 145   < > 148* 149*  K 3.3*  --   --  3.4*  CL 117*  --   --  120*  CO2 23  --   --  22  GLUCOSE 144*  --   --  108*  BUN 7  --   --  5*  CREATININE 0.56*  --   --  0.66  CALCIUM 7.9*  --   --  8.3*   < > = values in this interval not displayed.    Studies/Results: Dg Swallowing Func-speech Pathology  Result Date: 05/16/2017 Objective Swallowing Evaluation: Type of Study: MBS-Modified Barium Swallow Study  Patient Details Name: [name] MRN: [id number] Date of Birth: 1965-12-20 Today's Date: 05/16/2017 Time: SLP Start Time (ACUTE ONLY): 1340 -SLP Stop Time (ACUTE ONLY): 1410 SLP Time Calculation (min) (ACUTE ONLY): 30 min Past Medical History: Past Medical History: Diagnosis Date . Abdominal aneurysm [facility])   Patient reported  . Chronic kidney disease, stage III (moderate) (HCC)   Deterding . Hypertension  . Obesity (BMI 30-39.9)  . Other and unspecified hyperlipidemia  . Type II or unspecified type diabetes mellitus without mention of complication, not stated as uncontrolled  Past  Surgical History: Past Surgical History: Procedure Laterality Date . stomach vessel aneurysm    Patient reported  HPI: Patient is a 52 yo male, chronic alcoholic with right ICH, small SDH and skull fracture with right to left shift increased from 6 to 8mm per CT 05/14/17. Thrombocytopenia with likely poorly functioning platelets. Left hemiparesis with left neglect and loss of vision on left.  Subjective: alert Assessment / Plan / Recommendation CHL IP CLINICAL IMPRESSIONS 05/16/2017 Clinical Impression Pt presents with a mild-moderate oropharyngeal dysphagia marked by impaired sensorimotor function with decreased oral coordination, left sided residue post-swallow with anterior spillage.  Pharyngeal swallow triggers at the level of the pyriform sinuses for thin and nectar liquids, with immediate spilling into the laryngeal vestibule and moderate aspiration before the swallow occurs.  Aspiration is sensed - accompanied by a consistent cough response.  When pt tucks chin, he is able to protect his airway well with nectar thick liquids.  There is good pharyngeal clearance - no residue post -swallow.  For now, recommend initiating a dysphagia 1 diet with nectar thick liquids.  He MUST BE CUED to tuck his chin due to impulsivity/cognitive deficits.  He may use straws.  SLP will f/u for safety/diet progression.  SLP Visit Diagnosis Dysphagia, oropharyngeal phase (R13.12) Attention and concentration deficit following -- Frontal lobe and executive function deficit following -- Impact  on safety and function Moderate aspiration risk   CHL IP TREATMENT RECOMMENDATION 05/16/2017 Treatment Recommendations Therapy as outlined in treatment plan below   Prognosis 05/16/2017 Prognosis for Safe Diet Advancement Good Barriers to Reach Goals Cognitive deficits Barriers/Prognosis Comment -- CHL IP DIET RECOMMENDATION 05/16/2017 SLP Diet Recommendations Dysphagia 1 (Puree) solids;Nectar thick liquid Liquid Administration via Straw Medication  Administration Whole meds with puree Compensations Chin tuck;Slow rate Postural Changes Seated upright at 90 degrees   CHL IP OTHER RECOMMENDATIONS 05/16/2017 Recommended Consults -- Oral Care Recommendations Oral care BID Other Recommendations Order thickener from pharmacy   CHL IP FOLLOW UP RECOMMENDATIONS 05/16/2017 Follow up Recommendations Inpatient Rehab   CHL IP FREQUENCY AND DURATION 05/16/2017 Speech Therapy Frequency (ACUTE ONLY) min 3x week Treatment Duration 2 weeks      CHL IP ORAL PHASE 05/16/2017 Oral Phase Impaired Oral - Pudding Teaspoon -- Oral - Pudding Cup -- Oral - Honey Teaspoon -- Oral - Honey Cup -- Oral - Nectar Teaspoon -- Oral - Nectar Cup -- Oral - Nectar Straw Left anterior bolus loss;Weak lingual manipulation Oral - Thin Teaspoon -- Oral - Thin Cup -- Oral - Thin Straw Left anterior bolus loss;Weak lingual manipulation Oral - Puree Left anterior bolus loss;Weak lingual manipulation;Left pocketing in lateral sulci Oral - Mech Soft -- Oral - Regular -- Oral - Multi-Consistency -- Oral - Pill -- Oral Phase - Comment --  CHL IP PHARYNGEAL PHASE 05/16/2017 Pharyngeal Phase Impaired Pharyngeal- Pudding Teaspoon -- Pharyngeal -- Pharyngeal- Pudding Cup -- Pharyngeal -- Pharyngeal- Honey Teaspoon -- Pharyngeal -- Pharyngeal- Honey Cup -- Pharyngeal -- Pharyngeal- Nectar Teaspoon -- Pharyngeal -- Pharyngeal- Nectar Cup -- Pharyngeal -- Pharyngeal- Nectar Straw Delayed swallow initiation-pyriform sinuses;Reduced airway/laryngeal closure;Penetration/Aspiration before swallow;Moderate aspiration;Compensatory strategies attempted (with notebox) Pharyngeal Material enters airway, passes BELOW cords and not ejected out despite cough attempt by patient Pharyngeal- Thin Teaspoon -- Pharyngeal -- Pharyngeal- Thin Cup Delayed swallow initiation-pyriform sinuses;Reduced airway/laryngeal closure;Penetration/Aspiration before swallow;Moderate aspiration;Compensatory strategies attempted (with notebox) Pharyngeal  Material enters airway, passes BELOW cords and not ejected out despite cough attempt by patient Pharyngeal- Thin Straw Delayed swallow initiation-pyriform sinuses;Reduced airway/laryngeal closure;Penetration/Aspiration before swallow;Moderate aspiration;Compensatory strategies attempted (with notebox) Pharyngeal Material enters airway, passes BELOW cords and not ejected out despite cough attempt by patient Pharyngeal- Puree Delayed swallow initiation-vallecula Pharyngeal -- Pharyngeal- Mechanical Soft -- Pharyngeal -- Pharyngeal- Regular -- Pharyngeal -- Pharyngeal- Multi-consistency -- Pharyngeal -- Pharyngeal- Pill -- Pharyngeal -- Pharyngeal Comment --  No flowsheet data found. No flowsheet data found. Blenda MountsCouture, Amanda Laurice 05/16/2017, 2:56 PM               Assessment/Plan:   LOS: 4 days  Per DrStern, stop 3%NaCl. Start 0.9%saline at 6275ml/hr. Planning for CIR.    Georgiann Cockeroteat, Brian 05/17/2017, 7:44 AM  Patient's exam is stable. He is sleepy, but arouses easily.  Will move left side with encouragement.  He received 2 mg ativan yesterday, which was quite sedating.  OK to transfer to CIR when bed available.  I have stopped 3 % NaCl with latest Na 149.

## 2017-05-17 NOTE — Progress Notes (Signed)
Physical Therapy Treatment Patient Details Name: [name] MRN: [id number] DOB: September 13, 1965 Today's Date: 05/17/2017    History of Present Illness Patient is a 52 yo male, chronic alcoholic admitted after being found face down with evidence of head injury.  CT of head showed acute depressed Rt fronto-temporal skull fx with frontotemporal confluent acute hemorrhagic contusions, small Lt frontal lobe contusion, 6mm Rt > Lt midline shift with Rt uncal herniation, Rt to Lt ventricular entrapments as well ass R-IVH, SAH, and B-SDH.   He was also found ot have questionable of cortical avulsion fracture of dorsal aspect of the Lt olecranon possible at insertion of triceps tendon (Lt UE placed in sling).  He was started on hypertonic saline.  He was treated for Thrombocytopenia and hypokalemia due to chronic ETOH abuse.  He has h/o frequent ED visits and hospital admissions due to ETOH abuse.  Other significant PMH includes CKD, HTN, AAA, DM, and h/o prior traumatic SAH     PT Comments    PT continues to have a strong left lateral and posterior lean during transitions to an in sitting EOB.  He was not safe to attempt standing today as he was very internally distracted by his desire to have water.  He has significant cognitive deficits limiting his understanding of his deficits and how they relate to his safety and function. He is a strong Rancho V with emerging VI characteristics. He remains appropriate for CIR level therapies at discharge.    Follow Up Recommendations  CIR     Equipment Recommendations  Wheelchair cushion (measurements PT);Wheelchair (measurements PT)    Recommendations for Other Services Rehab consult     Precautions / Restrictions Precautions Precautions: Fall Precaution Comments: TBI Required Braces or Orthoses: Sling Restrictions Weight Bearing Restrictions: Yes LUE Weight Bearing: Non weight bearing    Mobility  Bed Mobility Overal bed mobility: Needs  Assistance Bed Mobility: Supine to Sit;Sit to Supine     Supine to sit: +2 for physical assistance;Total assist Sit to supine: +2 for physical assistance;Total assist   General bed mobility comments: assist for all aspects.  with max cues, he was able to assist with moving Rt LE toward EOB, but requires extensive assist for all other aspects.  He pushes heavily to Lt and posteriorly.  He will at times move his right leg spontaneously off the right side of the bed, but not when asked to sit up on the right side of the bed.  Significant effort had to be made to make sure he did not push the therapist behind him off the bed on the left and that he did not slide off of the EOB due to strong posterior lean and poor foot placement as well.   Transfers                 General transfer comment: unable to safely attempt                           Balance Overall balance assessment: Needs assistance Sitting-balance support: Feet supported Sitting balance-Leahy Scale: Poor Sitting balance - Comments: Pt initially requiring total A +2 to maintain EOB sitting as he pushes heavily to the Lt and posteriorly.  With cues and facilitation, he was able to progress to periods of mod A to maintain static sitting.  It worked best today to have the pt between both therapist while seated EOB.   Postural control: Left lateral lean;Posterior lean  Standing balance comment: Could not attempt safely yet and pt was too restless to safely get OOB and leave up in the chair.                             Cognition Arousal/Alertness: Awake/alert Behavior During Therapy: Restless;Impulsive Overall Cognitive Status: Impaired/Different from baseline Area of Impairment: Orientation;Attention;Memory;Following commands;Safety/judgement;Awareness;Problem solving               Rancho Levels of Cognitive Functioning Rancho Los Amigos Scales of Cognitive Functioning:  Confused/inappropriate/non-agitated(some VI traits, but not fully) Orientation Level: Disoriented to;Situation([facility], Feb 2019 ) Current Attention Level: Sustained(with cues ) Memory: Decreased recall of precautions;Decreased short-term memory Following Commands: Follows one step commands with increased time;Follows one step commands inconsistently Safety/Judgement: Decreased awareness of deficits;Decreased awareness of safety   Problem Solving: Slow processing;Decreased initiation;Difficulty sequencing;Requires verbal cues;Requires tactile cues General Comments: Pt perseverates on desire for H20.  He will follow one step commands ~75% of time with min - mod cues.  He self distracts frequently.  He demonstrate poor awareness of deficits and safety.  He demonstrated difficulty initiating moving swab to mouth to "brush teeth" - unsure if this was due to deficits with initiation, motor planning, or if due to distraction.  He can often recall accurate information, but has no application of that information in context.  For example, when asked, he could recall his swallow test is at 1:30 pm, but he kept asking for water and we reinforced he can have some liquids during his swallow test at 1:30 pm.                    PT Goals (current goals can now be found in the care plan section) Acute Rehab PT Goals Patient Stated Goal: to have some water  Progress towards PT goals: Progressing toward goals    Frequency    Min 4X/week      PT Plan Current plan remains appropriate    Co-evaluation PT/OT/SLP Co-Evaluation/Treatment: Yes Reason for Co-Treatment: Complexity of the patient's impairments (multi-system involvement);Necessary to address cognition/behavior during functional activity;For patient/therapist safety PT goals addressed during session: Mobility/safety with mobility;Balance;Strengthening/ROM        AM-PAC PT "6 Clicks" Daily Activity  Outcome Measure  Difficulty turning  over in bed (including adjusting bedclothes, sheets and blankets)?: Unable Difficulty moving from lying on back to sitting on the side of the bed? : Unable Difficulty sitting down on and standing up from a chair with arms (e.g., wheelchair, bedside commode, etc,.)?: Unable Help needed moving to and from a bed to chair (including a wheelchair)?: Total Help needed walking in hospital room?: Total Help needed climbing 3-5 steps with a railing? : Total 6 Click Score: 6    End of Session Equipment Utilized During Treatment: Other (comment)(L arm sling) Activity Tolerance: Other (comment)(limited by perseveration on water) Patient left: in bed;with call bell/phone within reach;with bed alarm set Nurse Communication: Mobility status PT Visit Diagnosis: Muscle weakness (generalized) (M62.81);History of falling (Z91.81);Difficulty in walking, not elsewhere classified (R26.2);Other symptoms and signs involving the nervous system (R29.898)     Time: [id number] PT Time Calculation (min) (ACUTE ONLY): 38 min  Charges:  $Neuromuscular Re-education: 23-37 mins       Ediberto Sens B. Sybella Harnish, PT, DPT [phone]           05/17/2017, 12:40 AM

## 2017-05-17 NOTE — Progress Notes (Signed)
  Speech Language Pathology Treatment: Dysphagia  Patient Details Name: Christopher ReichertManuel Shaffer MRN: [id number] DOB: [birth year] Today's Date: 05/17/2017 Time: [id number] SLP Time Calculation (min) (ACUTE ONLY): 10 min  Assessment / Plan / Recommendation Clinical Impression  Pt was seen for skilled ST targeting dysphagia goals.  Pt was consuming breakfast upon therapist's arrival with moderate left sided oral residue and anterior spillage noted.  RN reports one instance of coughing with liquids which she attributed to pt not adequately timing chin tuck with swallow.  Pt needed mod-max assist multimodal cues to recall and utilize chin tuck appropriately with nectar thick liquids.  He also had several instances where he needed mod-max cues for swallow initiation due to oral holding.  Pt fed himself applesauce with max faded to min assist multimodal cues for initiation and sequencing use of utensils.  Pt needed max cues intermittently to correct anterior spillage and left pocketing of purees as he seemed to have no awareness of his deficits.  A cognitive evaluation was also completed on this date.  Please refer to report for more detail related to cognition.  Pt was left in bed with bed alarm set and right mitt replaced.    HPI HPI: Patient is a 52 yo male, chronic alcoholic with right ICH, small SDH and skull fracture with right to left shift increased from 6 to 8mm per CT 05/14/17. Thrombocytopenia with likely poorly functioning platelets. Left hemiparesis with left neglect and loss of vision on left.      SLP Plan  Continue with current plan of care       Recommendations  Diet recommendations: Dysphagia 1 (puree);Nectar-thick liquid Liquids provided via: Cup;Straw Medication Administration: Crushed with puree Supervision: Patient able to self feed;Full supervision/cueing for compensatory strategies Compensations: Chin tuck;Slow rate Postural Changes and/or Swallow Maneuvers: Seated upright 90  degrees;Upright 30-60 min after meal;Chin tuck                Oral Care Recommendations: Oral care QID Follow up Recommendations: Inpatient Rehab SLP Visit Diagnosis: Dysphagia, oropharyngeal phase (R13.12) Plan: Continue with current plan of care       GO                PageMelanee Shaffer, [name] 05/17/2017, 9:36 AM

## 2017-05-18 LAB — GLUCOSE, CAPILLARY
GLUCOSE-CAPILLARY: 122 mg/dL — AB (ref 65–99)
Glucose-Capillary: 118 mg/dL — ABNORMAL HIGH (ref 65–99)
Glucose-Capillary: 167 mg/dL — ABNORMAL HIGH (ref 65–99)
Glucose-Capillary: 136 mg/dL — ABNORMAL HIGH (ref 65–99)
Glucose-Capillary: 192 mg/dL — ABNORMAL HIGH (ref 65–99)

## 2017-05-18 LAB — CBC
HEMATOCRIT: 29.1 % — AB (ref 39.0–52.0)
HEMOGLOBIN: 9.4 g/dL — AB (ref 13.0–17.0)
MCH: 34.3 pg — AB (ref 26.0–34.0)
MCHC: 32.3 g/dL (ref 30.0–36.0)
MCV: 106.2 fL — AB (ref 78.0–100.0)
Platelets: 132 10*3/uL — ABNORMAL LOW (ref 150–400)
RBC: 2.74 MIL/uL — AB (ref 4.22–5.81)
RDW: 14.9 % (ref 11.5–15.5)
WBC: 5.6 10*3/uL (ref 4.0–10.5)

## 2017-05-18 LAB — SODIUM
Sodium: 144 mmol/L (ref 135–145)
Sodium: 144 mmol/L (ref 135–145)
Sodium: 143 mmol/L (ref 135–145)

## 2017-05-18 LAB — BASIC METABOLIC PANEL WITH GFR
Anion gap: 9 (ref 5–15)
BUN: 7 mg/dL (ref 6–20)
CO2: 22 mmol/L (ref 22–32)
Calcium: 8.1 mg/dL — ABNORMAL LOW (ref 8.9–10.3)
Chloride: 113 mmol/L — ABNORMAL HIGH (ref 101–111)
Creatinine, Ser: 0.68 mg/dL (ref 0.61–1.24)
GFR calc Af Amer: >60 mL/min
GFR calc non Af Amer: >60 mL/min
Glucose, Bld: 117 mg/dL — ABNORMAL HIGH (ref 65–99)
Potassium: 3.2 mmol/L — ABNORMAL LOW (ref 3.5–5.1)
Sodium: 144 mmol/L (ref 135–145)

## 2017-05-18 MED ORDER — POTASSIUM CHLORIDE 20 MEQ/15ML (10%) PO SOLN
40.0000 meq | Freq: Two times a day (BID) | ORAL | Status: AC
  Administered 2017-05-18 (×2): 40 meq via ORAL
  Filled 2017-05-18 (×2): qty 30

## 2017-05-18 MED ORDER — LORAZEPAM 2 MG/ML IJ SOLN
1.0000 mg | INTRAMUSCULAR | Status: DC | PRN
  Administered 2017-05-18 – 2017-05-19 (×2): 2 mg via INTRAVENOUS
  Administered 2017-05-23 – 2017-05-25 (×2): 1 mg via INTRAVENOUS
  Administered 2017-06-01 – 2017-06-07 (×7): 2 mg via INTRAVENOUS
  Administered 2017-06-08: 1 mg via INTRAVENOUS
  Administered 2017-06-10 – 2017-06-15 (×6): 2 mg via INTRAVENOUS
  Administered 2017-06-15: 3 mg via INTRAVENOUS
  Administered 2017-06-16 – 2017-06-19 (×7): 2 mg via INTRAVENOUS
  Administered 2017-06-19: 1 mg via INTRAVENOUS
  Administered 2017-06-20 – 2017-06-21 (×5): 2 mg via INTRAVENOUS
  Filled 2017-05-18 (×6): qty 1
  Filled 2017-05-18: qty 2
  Filled 2017-05-18 (×19): qty 1
  Filled 2017-05-18: qty 2
  Filled 2017-05-18 (×6): qty 1

## 2017-05-18 NOTE — Progress Notes (Addendum)
Subjective: Patient reports "I am awake"  Objective: Vital signs in last 24 hours: Temp:  [98.9 F (37.2 C)-100.4 F (38 C)] 98.9 F (37.2 C) (02/27 0400) Pulse Rate:  [64-109] 65 (02/27 0700) Resp:  [16-32] 23 (02/27 0700) BP: (132-184)/(81-117) 132/86 (02/27 0700) SpO2:  [89 %-100 %] 97 % (02/27 0700)  Intake/Output from previous day: 02/26 0701 - 02/27 0700 In: 2536.5 [P.O.:360; I.V.:1861.5; IV Piggyback:315] Out: 600 [Urine:600] Intake/Output this shift: No intake/output data recorded.  Awakens to voice. Follows commands with encouragement. Moves all extremities, left weaker than right. Precedex continues.  Lab Results: Recent Labs    05/16/17 0342 05/17/17 0320  WBC 5.2 5.2  HGB 8.9* 10.0*  HCT 27.5* 30.8*  PLT 135* 131*   BMET Recent Labs    05/17/17 0320  05/17/17 2108 05/18/17 0626  NA 149*   < > 144 144  K 3.4*  --   --  3.2*  CL 120*  --   --  113*  CO2 22  --   --  22  GLUCOSE 108*  --   --  117*  BUN 5*  --   --  7  CREATININE 0.66  --   --  0.68  CALCIUM 8.3*  --   --  8.1*   < > = values in this interval not displayed.    Studies/Results: Dg Swallowing Func-speech Pathology  Result Date: 05/16/2017 Objective Swallowing Evaluation: Type of Study: MBS-Modified Barium Swallow Study  Patient Details Name: Vashon Riordan MRN: [id number] Date of Birth: 1966/02/15 Today's Date: 05/16/2017 Time: SLP Start Time (ACUTE ONLY): 1340 -SLP Stop Time (ACUTE ONLY): 1410 SLP Time Calculation (min) (ACUTE ONLY): 30 min Past Medical History: Past Medical History: Diagnosis Date . Abdominal aneurysm [facility])   Patient reported  . Chronic kidney disease, stage III (moderate) (HCC)   Deterding . Hypertension  . Obesity (BMI 30-39.9)  . Other and unspecified hyperlipidemia  . Type II or unspecified type diabetes mellitus without mention of complication, not stated as uncontrolled  Past Surgical History: Past Surgical History: Procedure Laterality Date . stomach vessel  aneurysm    Patient reported  HPI: Patient is a 52 yo male, chronic alcoholic with right ICH, small SDH and skull fracture with right to left shift increased from 6 to 8mm per CT 05/14/17. Thrombocytopenia with likely poorly functioning platelets. Left hemiparesis with left neglect and loss of vision on left.  Subjective: alert Assessment / Plan / Recommendation CHL IP CLINICAL IMPRESSIONS 05/16/2017 Clinical Impression Pt presents with a mild-moderate oropharyngeal dysphagia marked by impaired sensorimotor function with decreased oral coordination, left sided residue post-swallow with anterior spillage.  Pharyngeal swallow triggers at the level of the pyriform sinuses for thin and nectar liquids, with immediate spilling into the laryngeal vestibule and moderate aspiration before the swallow occurs.  Aspiration is sensed - accompanied by a consistent cough response.  When pt tucks chin, he is able to protect his airway well with nectar thick liquids.  There is good pharyngeal clearance - no residue post -swallow.  For now, recommend initiating a dysphagia 1 diet with nectar thick liquids.  He MUST BE CUED to tuck his chin due to impulsivity/cognitive deficits.  He may use straws.  SLP will f/u for safety/diet progression.  SLP Visit Diagnosis Dysphagia, oropharyngeal phase (R13.12) Attention and concentration deficit following -- Frontal lobe and executive function deficit following -- Impact on safety and function Moderate aspiration risk   CHL IP TREATMENT RECOMMENDATION 05/16/2017 Treatment Recommendations  Therapy as outlined in treatment plan below   Prognosis 05/16/2017 Prognosis for Safe Diet Advancement Good Barriers to Reach Goals Cognitive deficits Barriers/Prognosis Comment -- CHL IP DIET RECOMMENDATION 05/16/2017 SLP Diet Recommendations Dysphagia 1 (Puree) solids;Nectar thick liquid Liquid Administration via Straw Medication Administration Whole meds with puree Compensations Chin tuck;Slow rate Postural  Changes Seated upright at 90 degrees   CHL IP OTHER RECOMMENDATIONS 05/16/2017 Recommended Consults -- Oral Care Recommendations Oral care BID Other Recommendations Order thickener from pharmacy   CHL IP FOLLOW UP RECOMMENDATIONS 05/16/2017 Follow up Recommendations Inpatient Rehab   CHL IP FREQUENCY AND DURATION 05/16/2017 Speech Therapy Frequency (ACUTE ONLY) min 3x week Treatment Duration 2 weeks      CHL IP ORAL PHASE 05/16/2017 Oral Phase Impaired Oral - Pudding Teaspoon -- Oral - Pudding Cup -- Oral - Honey Teaspoon -- Oral - Honey Cup -- Oral - Nectar Teaspoon -- Oral - Nectar Cup -- Oral - Nectar Straw Left anterior bolus loss;Weak lingual manipulation Oral - Thin Teaspoon -- Oral - Thin Cup -- Oral - Thin Straw Left anterior bolus loss;Weak lingual manipulation Oral - Puree Left anterior bolus loss;Weak lingual manipulation;Left pocketing in lateral sulci Oral - Mech Soft -- Oral - Regular -- Oral - Multi-Consistency -- Oral - Pill -- Oral Phase - Comment --  CHL IP PHARYNGEAL PHASE 05/16/2017 Pharyngeal Phase Impaired Pharyngeal- Pudding Teaspoon -- Pharyngeal -- Pharyngeal- Pudding Cup -- Pharyngeal -- Pharyngeal- Honey Teaspoon -- Pharyngeal -- Pharyngeal- Honey Cup -- Pharyngeal -- Pharyngeal- Nectar Teaspoon -- Pharyngeal -- Pharyngeal- Nectar Cup -- Pharyngeal -- Pharyngeal- Nectar Straw Delayed swallow initiation-pyriform sinuses;Reduced airway/laryngeal closure;Penetration/Aspiration before swallow;Moderate aspiration;Compensatory strategies attempted (with notebox) Pharyngeal Material enters airway, passes BELOW cords and not ejected out despite cough attempt by patient Pharyngeal- Thin Teaspoon -- Pharyngeal -- Pharyngeal- Thin Cup Delayed swallow initiation-pyriform sinuses;Reduced airway/laryngeal closure;Penetration/Aspiration before swallow;Moderate aspiration;Compensatory strategies attempted (with notebox) Pharyngeal Material enters airway, passes BELOW cords and not ejected out despite cough  attempt by patient Pharyngeal- Thin Straw Delayed swallow initiation-pyriform sinuses;Reduced airway/laryngeal closure;Penetration/Aspiration before swallow;Moderate aspiration;Compensatory strategies attempted (with notebox) Pharyngeal Material enters airway, passes BELOW cords and not ejected out despite cough attempt by patient Pharyngeal- Puree Delayed swallow initiation-vallecula Pharyngeal -- Pharyngeal- Mechanical Soft -- Pharyngeal -- Pharyngeal- Regular -- Pharyngeal -- Pharyngeal- Multi-consistency -- Pharyngeal -- Pharyngeal- Pill -- Pharyngeal -- Pharyngeal Comment --  No flowsheet data found. No flowsheet data found. Blenda MountsCouture, Amanda Laurice 05/16/2017, 2:56 PM               Assessment/Plan: Improving  LOS: 5 days  Continue support. CIR when bed available.   Poteat, Arlys JohnBrian 05/18/2017, 8:02 AM   Na 144 this am.  Stable neurologically off 3 % NaCl.  Transfer to CIR when bed available.

## 2017-05-18 NOTE — Progress Notes (Addendum)
Inpatient Rehabilitation  Received notification from CSW that patient has a friend in the room; however, by the time I arrived friend was gone.  Patient stated that his friend Terressa KoyanagiLorenzo Campo left his number, [phone] and that I could call and discuss therapy needs with him.  I continue to seek caregiver support and discharge plan for a potential IP Rehab admission.  Call if questions.  Update: Spoke with friend Izora GalaLorenzo, who states that he and patient's spouse will be here to visit patient tomorrow.  I will plan to meet with them around 11am and if not present when they arrive, then please call me.  Thank you for your help.  Charlane FerrettiMelissa Makensie Mulhall, M.A., CCC/SLP Admission Coordinator  [facility]  Cell [phone]

## 2017-05-18 NOTE — Progress Notes (Signed)
PULMONARY / CRITICAL CARE MEDICINE   Name: [name] MRN: [id number] DOB: 1965/09/18    ADMISSION DATE:  05/13/2017  BRIEF SUMMARY:  52 y.o. male with PMH including EtOH abuse.  He presented to [facility] ED 2/22 with a head injury.  Came to ED night prior via EMS due to AMS and was found to have EtOH level of 277.  He did not appear to have any acute injuries and after monitoring in ED for several hours & was discharged home. He returned to ED early AM 2/22 and appeared to have head injury.  Head CT demonstrated acute depressed right frontotemoporal skull fx, right frontotemporal hemorrhagic contusions, small left frontal lobe hemorrhagic contusion, right uncal herniation with 6mm R to L ventricular entratpment, right lateral intraventricular hemorrhage, small volume SAH, small acute bilateral SDH's. Left humerus xray demonstrated probable small cortical avulsion fx.  He was admitted by neurosurgery and PCCM was asked to see in consultation.  SUBJECTIVE:  RN reports precedex turned off.  Pt asking for water.  No acute events overnight.    VITAL SIGNS: BP 132/86   Pulse 65   Temp 99.6 F (37.6 C) (Oral)   Resp (!) 23   Ht 6' (1.829 m)   Wt 211 lb 10.3 oz (96 kg)   SpO2 97%   BMI 28.70 kg/m   HEMODYNAMICS:    VENTILATOR SETTINGS:    INTAKE / OUTPUT: I/O last 3 completed shifts: In: 3274 [P.O.:360; I.V.:2599; IV Piggyback:315] Out: 1700 [Urine:1700]  PHYSICAL EXAMINATION: General:  Adult male in NAD HEENT: MM pink/dry, bruising to right side of face PSY: calm  Neuro: Awakens, alert, asking for water, speech clear, spontaneous movement of right side noted, pt does not follow commands on L but some spontaneous movement CV: s1s2 rrr, no m/r/g PULM: even/non-labored, lungs bilaterally clear  WU:JWJX, non-tender, bsx4 active  Extremities: warm/dry, no edema  Skin: no rashes or lesions  LABS:  BMET Recent Labs  Lab 05/16/17 0342  05/17/17 0320  05/17/17 1459  05/17/17 2108 05/18/17 0626  NA 145   < > 149*   < > 148* 144 144  K 3.3*  --  3.4*  --   --   --  3.2*  CL 117*  --  120*  --   --   --  113*  CO2 23  --  22  --   --   --  22  BUN 7  --  5*  --   --   --  7  CREATININE 0.56*  --  0.66  --   --   --  0.68  GLUCOSE 144*  --  108*  --   --   --  117*   < > = values in this interval not displayed.    Electrolytes Recent Labs  Lab 05/14/17 0443 05/15/17 0319  05/16/17 0342 05/17/17 0320 05/18/17 0626  CALCIUM 7.6*  --    < > 7.9* 8.3* 8.1*  MG 1.4* 1.5*  --  1.8  --   --   PHOS 2.3* 2.4*  --  2.6  --   --    < > = values in this interval not displayed.    CBC Recent Labs  Lab 05/16/17 0342 05/17/17 0320 05/18/17 0626  WBC 5.2 5.2 5.6  HGB 8.9* 10.0* 9.4*  HCT 27.5* 30.8* 29.1*  PLT 135* 131* 132*    Coag's Recent Labs  Lab 05/13/17 0501 05/15/17 0319  APTT 34  --  INR 1.23 1.38    Sepsis Markers No results for input(s): LATICACIDVEN, PROCALCITON, O2SATVEN in the last 168 hours.  ABG No results for input(s): PHART, PCO2ART, PO2ART in the last 168 hours.  Liver Enzymes Recent Labs  Lab 05/13/17 0223  AST 91*  ALT 31  ALKPHOS 224*  BILITOT 2.0*  ALBUMIN 2.6*    Cardiac Enzymes No results for input(s): TROPONINI, PROBNP in the last 168 hours.  Glucose Recent Labs  Lab 05/17/17 0821 05/17/17 1606 05/17/17 1945 05/17/17 2345 05/18/17 0357 05/18/17 0825  GLUCAP 121* 103* 120* 114* 122* 118*    Imaging No results found.  EVENTS  2/22  Admit with depressed skull fracture  2/24  On precedex 0.5, intermittent agitation.  3% NS   STUDIES 2/22  CT Head / Neck >> no acute neck fractures, motion degraded exam, acute depressed right frontotemporal skull fracture, R frontotemporal confluent large hemorrhagic contusions, small left frontal lobe hemorrhagic contusion, right uncal herniation & 6mm R to L ventricular entrapment, R lateral intraventricular hemorrhage, small volume acute subarachnoid  hemorrhage. 2/23  CT Head >> similar confluent R frontotemporal hemorrhagic contusions, small L frontal lobe hemorrhagic contusion, 8 mm R to L midline shift, right uncal herniation, small volume subarachnoid hemorrhage with similar bilateral small holohemispheric subdural hematomas & 5 mm falcotentorial subdural hematoma, redemonstration of depressed right frontotemporal skull fracture, nondisplaced L frontoparietal skull fracture  DISCUSSION: 52 year old alcoholic male who has suffered from a right frontal temporal depressed skull fracture with underlying cerebral contusion.  His only other known injury is a avulsion fracture of the left arm.  He is receiving 3% saline for control of intracranial hypertension and Precedex for control of alcohol withdrawal.  ASSESSMENT / PLAN:  PULMONARY A:  No active issues P:  Aspiration precautions  Oral care protcol  SLP following, appreciate input   CARDIOVASCULAR A:  No active issues   P: ICU monitoring  PRN labetalol for SBP > 170  RENAL A: Hypokalemia  P: NS @ 2275ml/hr  Trend BMP / urinary output Replace electrolytes as indicated Avoid nephrotoxic agents, ensure adequate renal perfusion  GASTROINTESTINAL A:  Stress Ulcer Prophylaxis  P: PPI  SLP following  HEMATOLOGIC A:  Thrombocytopenia  P: Continue folate, thiamine  Trend CBC  NEUROLOGIC A: Depressed Skull Fracture  ICH with 8 mm R to L Shift  Small SAH  ETOH Abuse  Agitated Delirium / ETOH Withdrawal  P: Discontinue precedex, ativan  Monitor Na  Continue Keppra  Continue thiamine, folate  CIR consulted, ok to transfer from medical standpoint  PT efforts   ORTHO A: Small Cortical Humerus L Avulsion Fracture  P: Continue sling / immobilization    Canary BrimBrandi Raydel Hosick, NP-C  Pulmonary & Critical Care Pgr: [phone] or if no answer [phone] 05/18/2017, 9:24 AM

## 2017-05-18 NOTE — Progress Notes (Signed)
Pt has been off precedex for 7 hrs. Asked Dr. Venetia MaxonStern if pt can transfer to floor while awaiting rehab plans. Orders received.

## 2017-05-19 LAB — CBC
HCT: 32 % — ABNORMAL LOW (ref 39.0–52.0)
HEMOGLOBIN: 10.4 g/dL — AB (ref 13.0–17.0)
MCH: 33.8 pg (ref 26.0–34.0)
MCHC: 32.5 g/dL (ref 30.0–36.0)
MCV: 103.9 fL — AB (ref 78.0–100.0)
Platelets: 162 10*3/uL (ref 150–400)
RBC: 3.08 MIL/uL — AB (ref 4.22–5.81)
RDW: 14.7 % (ref 11.5–15.5)
WBC: 7.5 10*3/uL (ref 4.0–10.5)

## 2017-05-19 LAB — BASIC METABOLIC PANEL
Anion gap: 8 (ref 5–15)
BUN: 7 mg/dL (ref 6–20)
CHLORIDE: 112 mmol/L — AB (ref 101–111)
CO2: 22 mmol/L (ref 22–32)
Calcium: 8.4 mg/dL — ABNORMAL LOW (ref 8.9–10.3)
Creatinine, Ser: 0.71 mg/dL (ref 0.61–1.24)
GFR calc Af Amer: >60 mL/min (ref >60)
GFR calc non Af Amer: >60 mL/min (ref >60)
GLUCOSE: 120 mg/dL — AB (ref 65–99)
POTASSIUM: 3.1 mmol/L — AB (ref 3.5–5.1)
Sodium: 142 mmol/L (ref 135–145)

## 2017-05-19 LAB — GLUCOSE, CAPILLARY
GLUCOSE-CAPILLARY: 105 mg/dL — AB (ref 65–99)
GLUCOSE-CAPILLARY: 107 mg/dL — AB (ref 65–99)
GLUCOSE-CAPILLARY: 114 mg/dL — AB (ref 65–99)
GLUCOSE-CAPILLARY: 115 mg/dL — AB (ref 65–99)
GLUCOSE-CAPILLARY: 135 mg/dL — AB (ref 65–99)
GLUCOSE-CAPILLARY: 137 mg/dL — AB (ref 65–99)
Glucose-Capillary: 129 mg/dL — ABNORMAL HIGH (ref 65–99)
Glucose-Capillary: 133 mg/dL — ABNORMAL HIGH (ref 65–99)

## 2017-05-19 LAB — SODIUM
SODIUM: 142 mmol/L (ref 135–145)
Sodium: 142 mmol/L (ref 135–145)
Sodium: 142 mmol/L (ref 135–145)

## 2017-05-19 NOTE — Progress Notes (Addendum)
Subjective: Patient reports "I slept so-so"  Objective: Vital signs in last 24 hours: Temp:  [98.3 F (36.8 C)-99.7 F (37.6 C)] 99.7 F (37.6 C) (02/28 0400) Pulse Rate:  [66-112] 100 (02/28 0400) Resp:  [13-21] 20 (02/28 0400) BP: (144-178)/(60-102) 155/88 (02/28 0515) SpO2:  [97 %-100 %] 100 % (02/28 0400)  Intake/Output from previous day: 02/27 0701 - 02/28 0700 In: 1872.9 [P.O.:360; I.V.:1512.9] Out: 2350 [Urine:2350] Intake/Output this shift: No intake/output data recorded.  Awake, responds appropriately to questions, stating name and location. Not moving left leg this morning, but moves BUE with improved left hand strength. No reported issues overnight. Na 142.  Lab Results: Recent Labs    05/18/17 0626 05/19/17 0343  WBC 5.6 7.5  HGB 9.4* 10.4*  HCT 29.1* 32.0*  PLT 132* 162   BMET Recent Labs    05/18/17 0626  05/18/17 2109 05/19/17 0343  NA 144   < > 143 142  K 3.2*  --   --  3.1*  CL 113*  --   --  112*  CO2 22  --   --  22  GLUCOSE 117*  --   --  120*  BUN 7  --   --  7  CREATININE 0.68  --   --  0.71  CALCIUM 8.1*  --   --  8.4*   < > = values in this interval not displayed.    Studies/Results: No results found.  Assessment/Plan:   LOS: 6 days  CIR when bed available   Georgiann Cockeroteat, Brian 05/19/2017, 7:37 AM

## 2017-05-19 NOTE — Progress Notes (Signed)
SLP Cancellation Note  Patient Details Name: Christopher ReichertManuel Shaffer MRN: [id number] DOB: 05/30/1965   Cancelled treatment:       Reason Eval/Treat Not Completed: Other (attempted to see pt for ST therapy session; however, pt, pt's friend, and pt's estranged wife were meeting with CIR admissions coordinator).  Will follow up as pt is available    Maryjane HurterPage, Chan Sheahan L 05/19/2017, 12:16 PM

## 2017-05-19 NOTE — Progress Notes (Signed)
Physical Therapy Treatment Patient Details Name: Christopher ReichertManuel Cocco MRN: [id number] DOB: [birth year] Today's Date: 05/19/2017    History of Present Illness Patient is a 52 yo male, chronic alcoholic admitted after being found face down with evidence of head injury.  CT of head showed acute depressed Rt fronto-temporal skull fx with frontotemporal confluent acute hemorrhagic contusions, small Lt frontal lobe contusion, 6mm Rt > Lt midline shift with Rt uncal herniation, Rt to Lt ventricular entrapments as well ass R-IVH, SAH, and B-SDH.   He was also found ot have questionable of cortical avulsion fracture of dorsal aspect of the Lt olecranon possible at insertion of triceps tendon (Lt UE placed in sling).  He was started on hypertonic saline.  He was treated for Thrombocytopenia and hypokalemia due to chronic ETOH abuse.  He has h/o frequent ED visits and hospital admissions due to ETOH abuse.  Other significant PMH includes CKD, HTN, AAA, DM, and h/o prior traumatic SAH     PT Comments    Patient lethargic this session, keeping eyes closed for most of it. Tolerated sitting EOB with assist of 2 and continues to demonstrate heavy pushing posteriorly and to the left. Follows 1 step simple commands inconsistently. Pt with decreased attention and awareness. Will continue to follow and progress as tolerated.    Follow Up Recommendations  CIR     Equipment Recommendations  Wheelchair cushion (measurements PT);Wheelchair (measurements PT)    Recommendations for Other Services       Precautions / Restrictions Precautions Precautions: Fall Precaution Comments: TBI Required Braces or Orthoses: Sling Restrictions Weight Bearing Restrictions: Yes LUE Weight Bearing: Non weight bearing    Mobility  Bed Mobility Overal bed mobility: Needs Assistance Bed Mobility: Supine to Sit;Sit to Supine     Supine to sit: +2 for physical assistance;Max assist Sit to supine: +2 for physical  assistance;Total assist   General bed mobility comments: Able to minimally initiate movement of RLE but requires assist for BLEs and to manage trunk. Pushes heavily towards left and posteriorly.   Transfers                 General transfer comment: Deferred  Ambulation/Gait                 Stairs            Wheelchair Mobility    Modified Rankin (Stroke Patients Only) Modified Rankin (Stroke Patients Only) Pre-Morbid Rankin Score: No symptoms Modified Rankin: Severe disability     Balance Overall balance assessment: Needs assistance Sitting-balance support: Feet supported;No upper extremity supported Sitting balance-Leahy Scale: Poor Sitting balance - Comments: Pt pushing posteriorly and to the left; requires assist of 2 for maintain support. Able to initiate some movements for anterior weight shift but not sustain. Adamant about laying down, heavy pusher. Postural control: Left lateral lean;Posterior lean                                  Cognition Arousal/Alertness: Lethargic Behavior During Therapy: Flat affect Overall Cognitive Status: Impaired/Different from baseline Area of Impairment: Attention;Orientation;Memory;Following commands;Safety/judgement;Awareness;Problem solving               Rancho Levels of Cognitive Functioning Rancho Los Amigos Scales of Cognitive Functioning: Confused/inappropriate/non-agitated Orientation Level: Disoriented to;Situation Current Attention Level: Focused Memory: Decreased recall of precautions;Decreased short-term memory Following Commands: Follows one step commands with increased time;Follows one step commands inconsistently Safety/Judgement: Decreased awareness  of deficits;Decreased awareness of safety Awareness: Intellectual Problem Solving: Slow processing;Decreased initiation;Difficulty sequencing;Requires verbal cues;Requires tactile cues General Comments: Pt lethargic today. Keeps eyes  closed for most of session Thinks the cops are after him. "I don't like the way you are treating me." Adamant about laying down.       Exercises      General Comments        Pertinent Vitals/Pain Pain Assessment: Faces Faces Pain Scale: No hurt    Home Living                      Prior Function            PT Goals (current goals can now be found in the care plan section) Progress towards PT goals: Not progressing toward goals - comment(secondary to lethargy)    Frequency    Min 4X/week      PT Plan Current plan remains appropriate    Co-evaluation PT/OT/SLP Co-Evaluation/Treatment: Yes Reason for Co-Treatment: Complexity of the patient's impairments (multi-system involvement);Necessary to address cognition/behavior during functional activity;For patient/therapist safety PT goals addressed during session: Mobility/safety with mobility;Balance        AM-PAC PT "6 Clicks" Daily Activity  Outcome Measure  Difficulty turning over in bed (including adjusting bedclothes, sheets and blankets)?: Unable Difficulty moving from lying on back to sitting on the side of the bed? : Unable Difficulty sitting down on and standing up from a chair with arms (e.g., wheelchair, bedside commode, etc,.)?: Unable Help needed moving to and from a bed to chair (including a wheelchair)?: Total Help needed walking in hospital room?: Total Help needed climbing 3-5 steps with a railing? : Total 6 Click Score: 6    End of Session Equipment Utilized During Treatment: Other (comment)(left arm sling) Activity Tolerance: Patient limited by lethargy Patient left: in bed;with call bell/phone within reach;with bed alarm set;with SCD's reapplied Nurse Communication: Mobility status PT Visit Diagnosis: Muscle weakness (generalized) (M62.81);History of falling (Z91.81);Difficulty in walking, not elsewhere classified (R26.2);Other symptoms and signs involving the nervous system (R29.898)      Time: 1478-2956 PT Time Calculation (min) (ACUTE ONLY): 26 min  Charges:  $Therapeutic Activity: 8-22 mins                    G Codes:       Mylo Red, PT, DPT [phone]     Blake Divine A Zeki Bedrosian 05/19/2017, 4:55 PM

## 2017-05-19 NOTE — NC FL2 (Signed)
[place] MEDICAID FL2 LEVEL OF CARE SCREENING TOOL     IDENTIFICATION  Patient Name: [name] Birthdate: 24-Apr-1965 Sex: male Admission Date (Current Location): 05/13/2017  [facility] and [place] Number:  Producer, television/film/video and Address:  [facility], [address] [place], [place] [address]      Provider Number: [id number]  Attending Physician Name and Address:  Maeola Harman, MD  Relative Name and Phone Number:       Current Level of Care: Hospital Recommended Level of Care: Skilled Nursing Facility Prior Approval Number:    Date Approved/Denied:   PASRR Number: [id number]  Discharge Plan: SNF    Current Diagnoses: Patient Active Problem List   Diagnosis Date Noted  . Focal hemorrhagic contusion of cerebrum (HCC)   . Subdural hematoma (HCC)   . Benign essential HTN   . Stage 3 chronic kidney disease (HCC)   . ETOH abuse   . Hypokalemia   . Acute blood loss anemia   . Anemia of chronic disease   . TBI (traumatic brain injury) (HCC) 05/13/2017  . Tachycardia   . Alcohol withdrawal (HCC) 04/03/2017  . DM2 (diabetes mellitus, type 2) (HCC) 04/03/2017  . Alcohol abuse with alcohol-induced mood disorder (HCC) 03/24/2017  . Frontal lobe contusion (HCC) 01/23/2017  . Encephalopathy acute   . Fall   . Subarachnoid hemorrhage following injury with brief loss of consciousness but without open intracranial wound (HCC) 07/17/2016  . Traumatic subarachnoid hemorrhage (HCC) 01/15/2015  . Alcohol intoxication (HCC) 01/15/2015  . SAH (subarachnoid hemorrhage) (HCC) 01/15/2015    Orientation RESPIRATION BLADDER Height & Weight     Self, Place  Normal Incontinent Weight: 211 lb 10.3 oz (96 kg) Height:  6' (182.9 cm)  BEHAVIORAL SYMPTOMS/MOOD NEUROLOGICAL BOWEL NUTRITION STATUS      Incontinent Diet(puree solids, nectar thick liquids)  AMBULATORY STATUS COMMUNICATION OF NEEDS Skin   Extensive Assist Verbally Skin abrasions                        Personal Care Assistance Level of Assistance  Bathing, Feeding, Dressing Bathing Assistance: Maximum assistance Feeding assistance: Limited assistance Dressing Assistance: Maximum assistance     Functional Limitations Info  Sight, Hearing, Speech Sight Info: Adequate Hearing Info: Adequate Speech Info: Adequate    SPECIAL CARE FACTORS FREQUENCY  PT (By licensed PT), OT (By licensed OT), Speech therapy     PT Frequency: 5x/wk OT Frequency: 5x/wk     Speech Therapy Frequency: 5x/wk      Contractures Contractures Info: Not present    Additional Factors Info  Code Status, Allergies, Insulin Sliding Scale, Isolation Precautions Code Status Info: Full Allergies Info: NKA   Insulin Sliding Scale Info: 0-15 units every 4 hours Isolation Precautions Info: Contact precautions; MRSA     Current Medications (05/19/2017):  This is the current hospital active medication list Current Facility-Administered Medications  Medication Dose Route Frequency Provider Last Rate Last Dose  . 0.9 %  sodium chloride infusion   Intravenous Continuous Maeola Harman, MD 75 mL/hr at 05/18/17 1430    . acetaminophen (TYLENOL) tablet 650 mg  650 mg Oral Q4H PRN Maeola Harman, MD       Or  . acetaminophen (TYLENOL) solution 650 mg  650 mg Per Tube Q4H PRN Maeola Harman, MD       Or  . acetaminophen (TYLENOL) suppository 650 mg  650 mg Rectal Q4H PRN Maeola Harman, MD   [phone]  mg at 05/13/17 2045  . acetaminophen-codeine (TYLENOL #3) 300-30 MG per tablet 1-2 tablet  1-2 tablet Oral Q4H PRN Maeola HarmanStern, Joseph, MD      . folic acid injection 1 mg  1 mg Intravenous Daily Desai, Rahul P, PA-C   1 mg at 05/19/17 1031  . insulin aspart (novoLOG) injection 0-15 Units  0-15 Units Subcutaneous Q4H Desai, Rahul P, PA-C   2 Units at 05/18/17 1700  . labetalol (NORMODYNE,TRANDATE) injection 20 mg  20 mg Intravenous Q10 min PRN Celine Mansesai, Rahul P, PA-C   20 mg at 05/19/17 0456  . levETIRAcetam (KEPPRA) 500 mg in  sodium chloride 0.9 % 100 mL IVPB  500 mg Intravenous Q12H Maeola HarmanStern, Joseph, MD   Stopped at 05/19/17 [phone]  . LORazepam (ATIVAN) injection 1-4 mg  1-4 mg Intravenous Q4H PRN Coletta Memosabbell, Kyle, MD   2 mg at 05/18/17 2226  . MEDLINE mouth rinse  15 mL Mouth Rinse BID Maeola HarmanStern, Joseph, MD   15 mL at 05/18/17 0939  . pantoprazole (PROTONIX) injection 40 mg  40 mg Intravenous QHS Maeola HarmanStern, Joseph, MD   40 mg at 05/17/17 2000  . RESOURCE THICKENUP CLEAR   Oral PRN Maeola HarmanStern, Joseph, MD      . senna-docusate (Senokot-S) tablet 1 tablet  1 tablet Oral BID Maeola HarmanStern, Joseph, MD   1 tablet at 05/19/17 1031  . thiamine (B-1) injection 100 mg  100 mg Intravenous Daily Desai, Rahul P, PA-C   100 mg at 05/19/17 1031     Discharge Medications: Please see discharge summary for a list of discharge medications.  Relevant Imaging Results:  Relevant Lab Results:   Additional Information SS#: [id number]  Baldemar LenisElizabeth M Sheretta Grumbine, LCSW
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
## 2017-05-19 NOTE — Progress Notes (Addendum)
Inpatient Rehabilitation  Met with patient, spouse [name] (who reports that they have been separated for 8 months), and friend Hall Busing.  They report that patient doesn't have any family who can provide care for him.  [name] works and has patient's sons are 67 and 54 and both in school full-time.  Patient's mom has Alzehimer's as is hospitalized in [facility] per report.  As a result, patient requires a long-term rehab and discharge support plan.  [name] is agreeable to helping with this but cannot provide assist or accept him back into their home.  As a result, we cannot offer an IP Rehab bed.  Call if questions.  Carmelia Roller., CCC/SLP Admission Coordinator  [place]  Cell [phone]

## 2017-05-20 ENCOUNTER — Inpatient Hospital Stay (HOSPITAL_COMMUNITY): Payer: Medicaid Other

## 2017-05-20 LAB — BASIC METABOLIC PANEL
ANION GAP: 7 (ref 5–15)
BUN: 12 mg/dL (ref 6–20)
CHLORIDE: 111 mmol/L (ref 101–111)
CO2: 20 mmol/L — AB (ref 22–32)
Calcium: 8.3 mg/dL — ABNORMAL LOW (ref 8.9–10.3)
Creatinine, Ser: 0.82 mg/dL (ref 0.61–1.24)
GFR calc non Af Amer: >60 mL/min (ref >60)
GLUCOSE: 149 mg/dL — AB (ref 65–99)
Potassium: 3.6 mmol/L (ref 3.5–5.1)
Sodium: 138 mmol/L (ref 135–145)

## 2017-05-20 LAB — SODIUM
SODIUM: 140 mmol/L (ref 135–145)
SODIUM: 140 mmol/L (ref 135–145)
Sodium: 141 mmol/L (ref 135–145)

## 2017-05-20 LAB — CBC
HEMATOCRIT: 33.3 % — AB (ref 39.0–52.0)
HEMOGLOBIN: 10.6 g/dL — AB (ref 13.0–17.0)
MCH: 33.5 pg (ref 26.0–34.0)
MCHC: 31.8 g/dL (ref 30.0–36.0)
MCV: 105.4 fL — AB (ref 78.0–100.0)
Platelets: 189 10*3/uL (ref 150–400)
RBC: 3.16 MIL/uL — ABNORMAL LOW (ref 4.22–5.81)
RDW: 14.5 % (ref 11.5–15.5)
WBC: 8.5 10*3/uL (ref 4.0–10.5)

## 2017-05-20 LAB — URINALYSIS, ROUTINE W REFLEX MICROSCOPIC
Bilirubin Urine: NEGATIVE
Glucose, UA: NEGATIVE mg/dL
Ketones, ur: NEGATIVE mg/dL
Leukocytes, UA: NEGATIVE
NITRITE: POSITIVE — AB
PROTEIN: NEGATIVE mg/dL
SPECIFIC GRAVITY, URINE: 1.019 (ref 1.005–1.030)
pH: 6 (ref 5.0–8.0)

## 2017-05-20 LAB — GLUCOSE, CAPILLARY
GLUCOSE-CAPILLARY: 93 mg/dL (ref 65–99)
GLUCOSE-CAPILLARY: 97 mg/dL (ref 65–99)
GLUCOSE-CAPILLARY: 131 mg/dL — AB (ref 65–99)
Glucose-Capillary: 103 mg/dL — ABNORMAL HIGH (ref 65–99)
Glucose-Capillary: 94 mg/dL (ref 65–99)

## 2017-05-20 NOTE — Progress Notes (Signed)
Awaiting to transfer patient to 4N progressive unit..Marland Kitchen

## 2017-05-20 NOTE — Progress Notes (Addendum)
CSW noting the discharge order for the patient. Patient had [facility] in [facility]. CSW contacted Admissions to ask them to start authorization request with BC[employer]; [facility] rescinded bed offer due to not having a private room for the patient, as he is on contact precautions. CSW contacted other facilities that are contracted with [employer] to see if they would review pending referral: [facility], [facility], [facility], [facility], and [facility].  [facility] has declined to offer a bed for the patient. CSW still awaiting decision from admissions at other facilities.  Patient has no bed offers at this time for SNF. Patient will need to secure a bed offer and also obtain [facility] prior to admission to SNF in order to discharge. CSW will continue to follow.  Blenda NicelyElizabeth Minaal Struckman, [place] Clinical Social Worker [phone]   UPDATE 11:30 AM:  [facility] for the patient. CSW still awaiting decision from [facility], [facility], and [facility]. CSW will continue to follow.  Blenda NicelyElizabeth Jermarion Poffenberger, [place] Clinical Social Worker [phone]

## 2017-05-20 NOTE — Progress Notes (Addendum)
Subjective: Patient reports "I'm ready to get out of here"  Objective: Vital signs in last 24 hours: Temp:  [98.1 F (36.7 C)-99.5 F (37.5 C)] 99 F (37.2 C) (03/01 0400) Pulse Rate:  [86-113] 113 (03/01 0400) Resp:  [16-20] 18 (03/01 0400) BP: (151-165)/(83-97) 151/97 (03/01 0400) SpO2:  [96 %-99 %] 96 % (03/01 0400)  Intake/Output from previous day: 02/28 0701 - 03/01 0700 In: 240 [P.O.:240] Out: 1500 [Urine:1500] Intake/Output this shift: No intake/output data recorded.  Awakens to voice, reporting no discomfort. Exam without change today. Cooperates with exam, requiring max encouragement and repetition. Moves left leg today, remains weak left arm and leg. Good strength right side.  Lab Results: Recent Labs    05/18/17 0626 05/19/17 0343  WBC 5.6 7.5  HGB 9.4* 10.4*  HCT 29.1* 32.0*  PLT 132* 162   BMET Recent Labs    05/18/17 0626  05/19/17 0343  05/19/17 2045 05/20/17 0322  NA 144   < > 142   < > 142 141  K 3.2*  --  3.1*  --   --   --   CL 113*  --  112*  --   --   --   CO2 22  --  22  --   --   --   GLUCOSE 117*  --  120*  --   --   --   BUN 7  --  7  --   --   --   CREATININE 0.68  --  0.71  --   --   --   CALCIUM 8.1*  --  8.4*  --   --   --    < > = values in this interval not displayed.    Studies/Results: No results found.  Assessment/Plan:   LOS: 7 days  Per DrStern d/c to SNF for continued rehab. Office f/u in 1 month.    Georgiann Cocker 05/20/2017, 7:42 AM   Awaiting placement in SNF.

## 2017-05-20 NOTE — Progress Notes (Signed)
Patient transferred to 4N18. Attempted calling son for update and the move but no response. Voice message left.

## 2017-05-20 NOTE — Progress Notes (Signed)
Pt febrile and tachycardic. MD notified

## 2017-05-20 NOTE — Progress Notes (Addendum)
14:00 Pt is lethargic today, Ox3, VSS. Pocketing food on right side of mouth. Not following commands to open eyes. MD notified

## 2017-05-20 NOTE — Progress Notes (Signed)
Patient is febrile and less responsive this afternoon.  He is following commands on the right side, but not on the left, opens eyes and speaks.    I will recheck head CT and check CBC, electrolytes.

## 2017-05-20 NOTE — Progress Notes (Addendum)
CSW following for discharge plan. [facility]; only bed offer for the patient at this time. Facility has initiated Brewing technologistinsurance authorization request with [employer]. Patient will need to receive prior authorization from [facility] prior to admission, facility will not accept LOG.   CSW alerted MD that patient does not have insurance approval at this time. CSW will continue to follow.  Blenda NicelyElizabeth Jordynne Mccown, [place] Clinical Social Worker [phone]

## 2017-05-20 NOTE — Clinical Social Work Note (Signed)
Clinical Social Work Assessment  Patient Details  Name: [name] MRN: [id number] Date of Birth: 10/09/1965  Date of referral:  05/19/17               Reason for consult:  Facility Placement                Permission sought to share information with:  Facility Sport and exercise psychologist, Family Supports Permission granted to share information::  Yes, Verbal Permission Granted  Name::     [name], Nature conservation officer::  SNF  Relationship::  Wife, Friend  Sport and exercise psychologist Information:     Housing/Transportation Living arrangements for the past 2 months:  Single Family Home Source of Information:  Engineer, materials, Spouse Patient Interpreter Needed:  None Criminal Activity/Legal Involvement Pertinent to Current Situation/Hospitalization:  No - Comment as needed Significant Relationships:  Friend, Spouse, Dependent Children Lives with:  Self Do you feel safe going back to the place where you live?  Yes Need for family participation in patient care:  Yes (Comment)(patient not oriented at this time)  Care giving concerns:  Patient has been living at home alone, as he and his wife have separated 8 months ago and she moved out with the two children (2 and 13). Patient has been in a downward spiral recently, as he has been drinking more and not handling his responsibilities. He was fired from his job two weeks ago, per friend and wife report. There is no one available to assist with providing care for the patient and he is going to need assistance with mobility and all ADLs due to his traumatic brain injury. Patient will benefit from SNF at discharge.   Social Worker assessment / plan:  CSW met with patient, patient's wife, and patient's friend at bedside to discuss discharge plan. CSW explained that patient would need SNF placement at discharge, and discussed SNF referral process and expectations. CSW discussed difficulty because of patient having recently been fired and unsure of how long the patient  will keep his insurance benefit. CSW also explained to the patient's wife that the patient may need long term placement after rehabilitation, because of his TBI, and that she will need to start working on what she needs to do to get Medicaid for the patient. CSW to complete referral and fax out, as well as follow up on what insurance benefits the patient will have at discharge.  Employment status:  Unemployed Forensic scientist:  Managed Care PT Recommendations:  Inpatient Rehab Consult Information / Referral to community resources:  [place]  Patient/Family's Response to care:  Patient unable to be assessed due to confusion from TBI. Patient's wife and patient's friend are agreeable to SNF and appreciative of CSW assistance.   Patient/Family's Understanding of and Emotional Response to Diagnosis, Current Treatment, and Prognosis:  Patient's wife was emotional about the patient's state, and that he is asking for family who have been dead for years. Patient's wife is aware that the patient cannot care for himself at this time and that the family is not able to provide the care that he needs when he returns home. Patient's wife and friend discussed how the patient has been having a rough time with life lately and has been unable to quit drinking and it's been getting worse.   Emotional Assessment Appearance:  Appears stated age Attitude/Demeanor/Rapport:  Unable to Assess Affect (typically observed):  Unable to Assess Orientation:  Oriented to Self, Oriented to Place Alcohol / Substance use:  Alcohol Use Psych  involvement (Current and /or in the community):  No (Comment)  Discharge Needs  Concerns to be addressed:  Care Coordination Readmission within the last 30 days:  Yes Current discharge risk:  Lives alone, Physical Impairment, Cognitively Impaired, Dependent with Mobility, Substance Abuse Barriers to Discharge:  Continued Medical Work up, [place], [place] 05/20/2017, 10:31 AM
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
## 2017-05-21 LAB — GLUCOSE, CAPILLARY
GLUCOSE-CAPILLARY: 100 mg/dL — AB (ref 65–99)
GLUCOSE-CAPILLARY: 115 mg/dL — AB (ref 65–99)
Glucose-Capillary: 147 mg/dL — ABNORMAL HIGH (ref 65–99)
Glucose-Capillary: 86 mg/dL (ref 65–99)
Glucose-Capillary: 106 mg/dL — ABNORMAL HIGH (ref 65–99)
Glucose-Capillary: 138 mg/dL — ABNORMAL HIGH (ref 65–99)

## 2017-05-21 LAB — SODIUM
SODIUM: 140 mmol/L (ref 135–145)
SODIUM: 143 mmol/L (ref 135–145)
Sodium: 143 mmol/L (ref 135–145)
Sodium: 143 mmol/L (ref 135–145)

## 2017-05-21 MED ORDER — SODIUM CHLORIDE 3 % IV SOLN
INTRAVENOUS | Status: DC
Start: 2017-05-21 — End: 2017-05-26
  Administered 2017-05-21 – 2017-05-26 (×12): 60 mL/h via INTRAVENOUS
  Filled 2017-05-21 (×28): qty 500

## 2017-05-21 MED ORDER — CHLORHEXIDINE GLUCONATE CLOTH 2 % EX PADS
6.0000 | MEDICATED_PAD | Freq: Every day | CUTANEOUS | Status: DC
  Administered 2017-05-22 – 2017-06-24 (×28): 6 via TOPICAL

## 2017-05-21 MED ORDER — SODIUM CHLORIDE 3 % IV SOLN
INTRAVENOUS | Status: DC
  Administered 2017-05-21: 60 mL/h via INTRAVENOUS
  Administered 2017-05-21: 50 mL/h via INTRAVENOUS
  Filled 2017-05-21 (×2): qty 500

## 2017-05-21 MED ORDER — MUPIROCIN 2 % EX OINT
TOPICAL_OINTMENT | Freq: Two times a day (BID) | CUTANEOUS | Status: DC
  Administered 2017-05-21 – 2017-05-22 (×4): 1 via NASAL
  Administered 2017-05-23 – 2017-06-17 (×40): via NASAL
  Administered 2017-06-18: 1 via NASAL
  Administered 2017-06-18: 09:00:00 via NASAL
  Administered 2017-06-19: 1 via NASAL
  Administered 2017-06-19 – 2017-06-20 (×2): via NASAL
  Administered 2017-06-20: 1 via NASAL
  Administered 2017-06-21 – 2017-06-24 (×7): via NASAL
  Filled 2017-05-21 (×4): qty 22
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
## 2017-05-22 LAB — GLUCOSE, CAPILLARY
GLUCOSE-CAPILLARY: 104 mg/dL — AB (ref 65–99)
GLUCOSE-CAPILLARY: 124 mg/dL — AB (ref 65–99)
GLUCOSE-CAPILLARY: 98 mg/dL (ref 65–99)
Glucose-Capillary: 111 mg/dL — ABNORMAL HIGH (ref 65–99)
Glucose-Capillary: 120 mg/dL — ABNORMAL HIGH (ref 65–99)
Glucose-Capillary: 133 mg/dL — ABNORMAL HIGH (ref 65–99)
Glucose-Capillary: 169 mg/dL — ABNORMAL HIGH (ref 65–99)

## 2017-05-22 LAB — SODIUM
SODIUM: 144 mmol/L (ref 135–145)
SODIUM: 144 mmol/L (ref 135–145)
Sodium: 143 mmol/L (ref 135–145)
Sodium: 144 mmol/L (ref 135–145)

## 2017-05-22 NOTE — Progress Notes (Signed)
Visited with patient. He kept asking for water and to put his shoes on.  I had prayer with him and requested staff as he asked.  He was open to prayer so had prayer with him. Phebe CollaDonna S Ethyle Tiedt, Chaplain   05/22/17 1600  Clinical Encounter Type  Visited With Patient  Visit Type Initial;Spiritual support  Referral From Nurse  Consult/Referral To Chaplain  Spiritual Encounters  Spiritual Needs Prayer

## 2017-05-22 NOTE — Progress Notes (Signed)
Pt states to RN that he does not have intentions of harming himself. Pt also verbalized to MD during morning rounds that he has not thoughts of suicidal ideation and does not want to harm himself and he feels much better this morning. MD gave verbal order to DC SI sitter. RN will continue to monitor and assess pt closely.

## 2017-05-22 NOTE — Plan of Care (Signed)
Pt ate 100% of his lunch and states that he enjoyed it and was grateful for the food.
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
## 2017-05-22 NOTE — Progress Notes (Signed)
Patient stating that he "is going to fall out of this bed". After I further explained our concern for his safety, he stated that he "wants to harm" himself and that he "has ideas to commit suicide", stating that his "depression leads to these thoughts and that [he] sometimes wants to hurt others". Dr. Venetia MaxonStern and Aggie Cosierrystal RN [facility]) notified. Nurse Tech called to sit with patient until suicide sitter is assigned.

## 2017-05-23 LAB — GLUCOSE, CAPILLARY
GLUCOSE-CAPILLARY: 124 mg/dL — AB (ref 65–99)
GLUCOSE-CAPILLARY: 83 mg/dL (ref 65–99)
GLUCOSE-CAPILLARY: 90 mg/dL (ref 65–99)
GLUCOSE-CAPILLARY: 91 mg/dL (ref 65–99)
Glucose-Capillary: 94 mg/dL (ref 65–99)
Glucose-Capillary: 153 mg/dL — ABNORMAL HIGH (ref 65–99)

## 2017-05-23 LAB — SODIUM
SODIUM: 143 mmol/L (ref 135–145)
SODIUM: 145 mmol/L (ref 135–145)
Sodium: 144 mmol/L (ref 135–145)
Sodium: 144 mmol/L (ref 135–145)

## 2017-05-23 NOTE — Clinical Social Work Note (Signed)
[facility] with [facility] ([facility]). They have determined that pt's insurance premiums have not been paid therefore they do not anticipate they will get a authorization from [facility] therefore, pt would not get a authorization. Facility to update CSW on [employer] decision.   Mission HillsBridget Destyn Schuyler, [place] [phone]

## 2017-05-23 NOTE — Progress Notes (Signed)
CSW following for discharge plan. CSW received phone call from patient's wife who requested update on the patient's status and what was going on with him. CSW reviewed chart from the weekend and updated patient's wife that he was transferred back to 4N ICU over the weekend, but appears to be doing better today than he was on Friday afternoon. CSW updated patient's wife that the patient has received a bed offer at [facility] and that they are checking on his insurance benefit. Patient's wife expressed concern about that, and CSW assured her that she would keep her updated when additional information is received. Patient's wife was given patient's new room number at the hospital.   CSW will continue to follow.  Blenda NicelyElizabeth Karolina Zamor, [place] Clinical Social Worker [phone]

## 2017-05-23 NOTE — Progress Notes (Addendum)
Subjective: [facility] Can [facility] coffee?"  Objective: Vital signs in last 24 hours: Temp:  [97.8 F (36.6 C)-99.1 F (37.3 C)] 98.9 F (37.2 C) (03/03 2309) Pulse Rate:  [65-105] 71 (03/04 0600) Resp:  [12-20] 16 (03/04 0600) BP: (125-176)/(71-108) 143/89 (03/04 0500) SpO2:  [95 %-100 %] 95 % (03/04 0600)  Intake/Output from previous day: 03/03 0701 - 03/04 0700 In: 2335 [P.O.:850; I.V.:1380; IV Piggyback:105] Out: 3150 [Urine:3150] Intake/Output this shift: No intake/output data recorded.  Awake, eyes open, talking. Left hand grip on command, but not moving LLE. NaCl 3% continues. Na 143 at 2348.  Lab Results: Recent Labs    05/20/17 2039  WBC 8.5  HGB 10.6*  HCT 33.3*  PLT 189   BMET Recent Labs    05/20/17 2039  05/22/17 1704 05/22/17 2348  NA 138   < > 144 143  K 3.6  --   --   --   CL 111  --   --   --   CO2 20*  --   --   --   GLUCOSE 149*  --   --   --   BUN 12  --   --   --   CREATININE 0.82  --   --   --   CALCIUM 8.3*  --   --   --    < > = values in this interval not displayed.    Studies/Results: No results found.  Assessment/Plan:   LOS: 10 days  Continue support.   Georgiann Cockeroteat, Brian 05/23/2017, 7:22 AM   Patient is better with 3 % NaCl drip.  Will continue that today.

## 2017-05-23 NOTE — Progress Notes (Signed)
  Speech Language Pathology Treatment: Dysphagia;Cognitive-Linquistic  Patient Details Name: Christopher [facility]uel [facility] MRN: [id number] DOB: August 29, 1965 Today's Date: 05/23/2017 Time: 1000-1027 SLP Time Calculation (min) (ACUTE ONLY): 27 min  Assessment / Plan / Recommendation Clinical Impression  Despite lethargy pt continue to tolerate recommended dys 1/nectar thick diet without appearance of aspiration subjectively during am meal. Total assist positioning needed with pillow and towel to consistently achieve chin tuck. Utilized choice cues and Y/N question to facilitate expression of wants and needs. Pt Required intermittent verbal cues to sustain arousal and attention to task.   HPI HPI: Patient is a 52 yo male, chronic alcoholic with right ICH, small SDH and skull fracture with right to left shift increased from 6 to 8mm per CT 05/14/17. Thrombocytopenia with likely poorly functioning platelets. Left hemiparesis with left neglect and loss of vision on left.      SLP Plan  Continue with current plan of care       Recommendations  Diet recommendations: Dysphagia 1 (puree);Nectar-thick liquid Liquids provided via: Cup Medication Administration: Crushed with puree Supervision: Patient able to self feed;Full supervision/cueing for compensatory strategies Compensations: Chin tuck;Slow rate                Plan: Continue with current plan of care       GO                [name], Christopher NearingBonnie [facility] 05/23/2017, 3:03 PM
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
## 2017-05-24 LAB — GLUCOSE, CAPILLARY
GLUCOSE-CAPILLARY: 95 mg/dL (ref 65–99)
GLUCOSE-CAPILLARY: 104 mg/dL — AB (ref 65–99)
Glucose-Capillary: 85 mg/dL (ref 65–99)
Glucose-Capillary: 82 mg/dL (ref 65–99)

## 2017-05-24 LAB — SODIUM
SODIUM: 143 mmol/L (ref 135–145)
SODIUM: 144 mmol/L (ref 135–145)
Sodium: 145 mmol/L (ref 135–145)
Sodium: 144 mmol/L (ref 135–145)

## 2017-05-24 NOTE — Progress Notes (Addendum)
Subjective: Patient reports "Please sir..please sir..."  Objective: Vital signs in last 24 hours: Temp:  [98 F (36.7 C)-98.6 F (37 C)] 98.2 F (36.8 C) (03/05 0400) Pulse Rate:  [67-105] 74 (03/05 0630) Resp:  [11-22] 16 (03/05 0630) BP: (119-197)/(81-108) 158/100 (03/05 0630) SpO2:  [97 %-100 %] 98 % (03/05 0630)  Intake/Output from previous day: 03/04 0701 - 03/05 0700 In: 1650 [I.V.:1440; IV Piggyback:210] Out: 3000 [Urine:3000] Intake/Output this shift: No intake/output data recorded.  Arouses to voice, but does not cooperate with exam today. Pt has apparently been awake most of the night per nursing. Pts awareness and orientation inconsistent.   Lab Results: No results for input(s): WBC, HGB, HCT, PLT in the last 72 hours. BMET Recent Labs    05/23/17 1701 05/23/17 2358  NA 144 143    Studies/Results: No results found.  Assessment/Plan:   LOS: 11 days  Continue support. Per DrStern, continue 3%NaCl today.   [name], [name] 05/24/2017, 7:37 AM   Continue supportive care today.  Patient more alert and responsive on 3 % NaCL drip.

## 2017-05-25 LAB — GLUCOSE, CAPILLARY
GLUCOSE-CAPILLARY: 100 mg/dL — AB (ref 65–99)
GLUCOSE-CAPILLARY: 135 mg/dL — AB (ref 65–99)
Glucose-Capillary: 135 mg/dL — ABNORMAL HIGH (ref 65–99)
Glucose-Capillary: 145 mg/dL — ABNORMAL HIGH (ref 65–99)
Glucose-Capillary: 88 mg/dL (ref 65–99)
Glucose-Capillary: 89 mg/dL (ref 65–99)
Glucose-Capillary: 93 mg/dL (ref 65–99)
Glucose-Capillary: 93 mg/dL (ref 65–99)

## 2017-05-25 LAB — SODIUM
SODIUM: 144 mmol/L (ref 135–145)
SODIUM: 147 mmol/L — AB (ref 135–145)
Sodium: 147 mmol/L — ABNORMAL HIGH (ref 135–145)
Sodium: 147 mmol/L — ABNORMAL HIGH (ref 135–145)

## 2017-05-25 MED ORDER — LABETALOL HCL 5 MG/ML IV SOLN
10.0000 mg | INTRAVENOUS | Status: DC | PRN
  Administered 2017-05-25: 20 mg via INTRAVENOUS
  Administered 2017-05-25: 10 mg via INTRAVENOUS
  Administered 2017-05-26: 20 mg via INTRAVENOUS
  Filled 2017-05-25 (×3): qty 4

## 2017-05-25 NOTE — Progress Notes (Signed)
  Speech Language Pathology Treatment: Cognitive-Linquistic  Patient Details Name: [name] MRN: [id number] DOB: 04/23/1965 Today's Date: 05/25/2017 Time: [id number] SLP Time Calculation (min) (ACUTE ONLY): 18 min  Assessment / Plan / Recommendation Clinical Impression  Pt was seen for skilled ST targeting cognitive goals.  Pt was sleeping soundly upon arrival but awakened to voice with sternal rub and cold compress.  Upon awakening, pt was very drowsy.  He was oriented to place and situation and could tell therapist the month but reported that the year was 2012.  Pt needed almost constant repositioning in bed due to pusher tendencies.  SLP completed oral care with suction toothette to minimize bacterial load; however, due to lethargy and subsequent inability to maintain adequate positioning for safe intake, POs were withheld at this time.  Pt was able to use a dry toothbrush to simulate oral care with hand over hand faded to max assist multimodal cues for task initiation and sequencing.  Pt was left in bed with call bell within reach.  Continue per current plan of care.    HPI HPI: Patient is a 52 yo male, chronic alcoholic with right ICH, small SDH and skull fracture with right to left shift increased from 6 to 8mm per CT 05/14/17. Thrombocytopenia with likely poorly functioning platelets. Left hemiparesis with left neglect and loss of vision on left.      SLP Plan  Continue with current plan of care       Recommendations                   Oral Care Recommendations: Oral care QID Follow up Recommendations: Skilled Nursing facility SLP Visit Diagnosis: Attention and concentration deficit;Frontal lobe and executive function deficit;Cognitive communication deficit (R41.841) Plan: Continue with current plan of care       GO                PageMelanee Spry, Shelba Susi L 05/25/2017, 10:57 AM
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
## 2017-05-25 NOTE — Progress Notes (Addendum)
Subjective: Patient reports "I'm trying to tell her where I'm itching"  Objective: Vital signs in last 24 hours: Temp:  [97.1 F (36.2 C)-98.8 F (37.1 C)] 98.8 F (37.1 C) (03/06 0400) Pulse Rate:  [71-102] 102 (03/06 0700) Resp:  [13-24] 17 (03/06 0700) BP: (133-181)/(77-116) 153/103 (03/06 0700) SpO2:  [97 %-100 %] 98 % (03/06 0700)  Intake/Output from previous day: 03/05 0701 - 03/06 0700 In: 1710 [P.O.:60; I.V.:1440; IV Piggyback:210] Out: 2300 [Urine:2300] Intake/Output this shift: No intake/output data recorded.  No change with exam today. Left hemiparesis persists, minimal grip left hand. Intermittent confusion. Cooperates with repetitive requests.   Lab Results: No results for input(s): WBC, HGB, HCT, PLT in the last 72 hours. BMET Recent Labs    05/24/17 1854 05/25/17 0011  NA 144 144    Studies/Results: No results found.  Assessment/Plan:   LOS: 12 days  Continue support. 3%NaCl continues. Will resume prn med for BP.    Georgiann Cockeroteat, Brian 05/25/2017, 8:15 AM   Patient is stable.  Continue to work with PT.  Continue 3 % NaCl today.

## 2017-05-26 DIAGNOSIS — E1122 Type 2 diabetes mellitus with diabetic chronic kidney disease: Secondary | ICD-10-CM

## 2017-05-26 DIAGNOSIS — Z833 Family history of diabetes mellitus: Secondary | ICD-10-CM

## 2017-05-26 DIAGNOSIS — I129 Hypertensive chronic kidney disease with stage 1 through stage 4 chronic kidney disease, or unspecified chronic kidney disease: Secondary | ICD-10-CM

## 2017-05-26 DIAGNOSIS — R4182 Altered mental status, unspecified: Secondary | ICD-10-CM

## 2017-05-26 LAB — GLUCOSE, CAPILLARY
Glucose-Capillary: 99 mg/dL (ref 65–99)
Glucose-Capillary: 82 mg/dL (ref 65–99)
Glucose-Capillary: 130 mg/dL — ABNORMAL HIGH (ref 65–99)
Glucose-Capillary: 86 mg/dL (ref 65–99)
Glucose-Capillary: 90 mg/dL (ref 65–99)
Glucose-Capillary: 95 mg/dL (ref 65–99)

## 2017-05-26 LAB — SODIUM
SODIUM: 148 mmol/L — AB (ref 135–145)
Sodium: 146 mmol/L — ABNORMAL HIGH (ref 135–145)
Sodium: 148 mmol/L — ABNORMAL HIGH (ref 135–145)
Sodium: 148 mmol/L — ABNORMAL HIGH (ref 135–145)

## 2017-05-26 MED ORDER — ENSURE ENLIVE PO LIQD
237.0000 mL | Freq: Three times a day (TID) | ORAL | Status: DC
  Administered 2017-05-26 – 2017-07-26 (×141): 237 mL via ORAL

## 2017-05-26 NOTE — Progress Notes (Signed)
Occupational Therapy Treatment Patient Details Name: [name] MRN: [id number] DOB: [birth year] Today's Date: 05/26/2017    History of present illness Patient is a 52 yo male, chronic alcoholic admitted after being found face down with evidence of head injury.  CT of head showed acute depressed Rt fronto-temporal skull fx with frontotemporal confluent acute hemorrhagic contusions, small Lt frontal lobe contusion, 6mm Rt > Lt midline shift with Rt uncal herniation, Rt to Lt ventricular entrapments as well ass R-IVH, SAH, and B-SDH.   He was also found ot have questionable of cortical avulsion fracture of dorsal aspect of the Lt olecranon possible at insertion of triceps tendon (Lt UE placed in sling).  Other significant PMH includes ETOH, CKD, HTN, AAA, DM, and h/o prior traumatic SAH    OT comments  Focus of session on self feeding. Pt able to sustain attention to task for short periods. Pt required hand over hand to maintain appropriate rate and amount of intake. Pt oriented to place and situation today but not to time. Confused but appropriate drurng session. Will continue to follow acutely.   Follow Up Recommendations  SNF    Equipment Recommendations  None recommended by OT    Recommendations for Other Services      Precautions / Restrictions Precautions Precautions: Fall Precaution Comments: TBI Required Braces or Orthoses: Sling Restrictions LUE Weight Bearing: Non weight bearing       Mobility Bed Mobility                  Transfers                      Balance                                           ADL either performed or assessed with clinical judgement   ADL Overall ADL's : Needs assistance/impaired Eating/Feeding: Moderate assistance Eating/Feeding Details (indicate cue type and reason): hand over hand to sustain attention to task and maintain appropirate rate and intake amount                                         Vision       Perception     Praxis      Cognition Arousal/Alertness: Lethargic Behavior During Therapy: Flat affect Overall Cognitive Status: Impaired/Different from baseline Area of Impairment: Orientation;Attention;Memory;Following commands;Safety/judgement;Awareness;Problem solving;Rancho level               Rancho Levels of Cognitive Functioning [place] Mirant Scales of Cognitive Functioning: Confused/inappropriate/non-agitated Orientation Level: Disoriented to;Time Current Attention Level: Sustained Memory: Decreased recall of precautions;Decreased short-term memory Following Commands: Follows one step commands inconsistently Safety/Judgement: Decreased awareness of safety;Decreased awareness of deficits Awareness: Intellectual Problem Solving: Slow processing;Difficulty sequencing;Decreased initiation;Requires verbal cues;Requires tactile cues          Exercises     Shoulder Instructions       General Comments      Pertinent Vitals/ Pain       Pain Assessment: Faces Faces Pain Scale: No hurt  Home Living  Prior Functioning/Environment              Frequency  Min 3X/week        Progress Toward Goals  OT Goals(current goals can now be found in the care plan section)  Progress towards OT goals: Progressing toward goals  Acute Rehab OT Goals Patient Stated Goal: none stated OT Goal Formulation: Patient unable to participate in goal setting Time For Goal Achievement: 05/30/17 Potential to Achieve Goals: Good ADL Goals Pt Will Perform Eating: with min assist;sitting Pt Will Perform Grooming: with min assist;sitting Pt Will Perform Upper Body Bathing: with mod assist;sitting Pt Will Transfer to Toilet: with mod assist;with +2 assist;bedside commode Additional ADL Goal #1: Pt will sustain attention to simple ADL task x 4 mins with min cues Additional ADL Goal #2: Pt  will demonstrates intellectual awareness of deficits  Plan Discharge plan remains appropriate    Co-evaluation                 AM-PAC PT "6 Clicks" Daily Activity     Outcome Measure   Help from another person eating meals?: A Lot Help from another person taking care of personal grooming?: A Lot Help from another person toileting, which includes using toliet, bedpan, or urinal?: Total Help from another person bathing (including washing, rinsing, drying)?: Total Help from another person to put on and taking off regular upper body clothing?: Total Help from another person to put on and taking off regular lower body clothing?: Total 6 Click Score: 8    End of Session    OT Visit Diagnosis: Unsteadiness on feet (R26.81);Cognitive communication deficit (R41.841)   Activity Tolerance Patient limited by lethargy   Patient Left in bed;with call bell/phone within reach;with bed alarm set;with restraints reapplied   Nurse Communication Mobility status;Other (comment)(precuations with feeding)        Time: 1330-1350 OT Time Calculation (min): 20 min  Charges: OT General Charges $OT Visit: 1 Visit OT Treatments $Self Care/Home Management : 8-22 mins  [facility] Aldean Suddeth, OT/L  [phone] 05/26/2017   Marvion Bastidas,HILLARY 05/26/2017, 4:19 PM

## 2017-05-26 NOTE — Care Management Note (Addendum)
Case Management Note  Patient Details  Name: Christopher ReichertManuel Shaffer MRN: [id number] Date of Birth: 18-Feb-1966  Subjective/Objective:    Patient is chronic alcoholic with right ICH, small SDH and skull fracture with right to left shift of 6 mm.  Thrombocytopenia with likely poorly functioning platelets.  Left hemiparesis with left neglect and loss of vision on left.  PTA, pt independent; uncertain of living situation.                   Action/Plan: Pt currently confused and agitated.  Will follow for discharge planning as pt progresses.  PT/OT to follow when able to tolerate therapies.    Expected Discharge Date:   Expected Discharge Plan:  Skilled Nursing Facility  In-House Referral:  Clinical Social Work  Discharge planning Services  CM Consult  Post Acute Care Choice:    Choice offered to:     DME Arranged:    DME Agency:     HH Arranged:    HH Agency:     Status of Service:  In process, will continue to follow  If discussed at Long Length of Stay Meetings, dates discussed:    Additional Comments:  05/26/17 J. Shayden Gingrich, RN, BSN CSW continues to follow to facilitate dc to SNF upon medical stability.  Will continue to follow progress.    Quintella BatonJulie W. Mackie Goon, RN, BSN  Trauma/Neuro ICU Case Manager [phone]

## 2017-05-26 NOTE — Progress Notes (Signed)
Initial Nutrition Assessment  DOCUMENTATION CODES:   Not applicable  INTERVENTION:   Ensure Enlive po TID, each supplement provides 350 kcal and 20 grams of protein  Recommend obtain new weight  NUTRITION DIAGNOSIS:   Inadequate oral intake related to dysphagia, lethargy/confusion as evidenced by meal completion < 50%.  GOAL:   Patient will meet greater than or equal to 90% of their needs  MONITOR:   PO intake, Supplement acceptance, Diet advancement, Weight trends  REASON FOR ASSESSMENT:   Rounds    ASSESSMENT:   Pt with PMH of ETOH abuse, previous TBI, AAA, CKD, HTN, with frequent visit to ER admitted 2/22 (found down with head injury) with R ICH, small SDH, skull fx, L hemiparesis, L neglect and loss of vision on the left.    Pt discussed during ICU rounds and with RN.  3/1 pt transferred back to ICU d/t lethargy, CT showed worsening edema and shift - started on 3%  Pt remains confused, unable to provide any nutrition hx. Suspect poor nutrition with hx of ETOH abuse.  Pt now off 3%, possible suicide ideation on admission therefore psych eval planned. Per care team plan to transfer to floor today.  Per RN pt must be fed but is eating well. Breakfast at bedside at lunch time and untouched. Pt does state he likes milkshakes and would drink them.   Noted no new weight since admission. Attempted to weigh on bed scale but unsure of accuracy. Admission weight of 210 lb consistent with his other weight encounters since November. Intake recordings since admission are variable.   Medications reviewed and include: folic acid, SSI, thiamine  Labs reviewed: Na 148 (H) now off 3%    NUTRITION - FOCUSED PHYSICAL EXAM:    Most Recent Value  Orbital Region  No depletion  Upper Arm Region  No depletion  Thoracic and Lumbar Region  No depletion  Buccal Region  No depletion  Temple Region  No depletion  Clavicle Bone Region  Mild depletion  Clavicle and Acromion Bone Region   Mild depletion  Scapular Bone Region  Unable to assess  Dorsal Hand  Unable to assess  Patellar Region  Moderate depletion  Anterior Thigh Region  Severe depletion  Posterior Calf Region  Severe depletion  Edema (RD Assessment)  None  Hair  Reviewed  Eyes  Unable to assess  Mouth  Reviewed  Skin  Reviewed  Nails  Unable to assess       Diet Order:  Fall precautions DIET - DYS 1 Room service appropriate? Yes; Fluid consistency: Nectar Thick  EDUCATION NEEDS:   No education needs have been identified at this time  Skin:  Skin Assessment: Reviewed RN Assessment  Last BM:  3/7 per tech mostly liquid  Height:   Ht Readings from Last 1 Encounters:  05/13/17 6' (1.829 m)    Weight:   Wt Readings from Last 1 Encounters:  05/13/17 211 lb 10.3 oz (96 kg)    Ideal Body Weight:  80.9 kg  BMI:  Body mass index is 28.7 kg/m.  Estimated Nutritional Needs:   Kcal:  2100-2300  Protein:  125-140 grams  Fluid:  >2.1 L/day  Kendell BaneHeather Tiyah Zelenak RD, LDN, CNSC [phone] Pager [phone] After Hours Pager

## 2017-05-26 NOTE — Progress Notes (Addendum)
Subjective: Patient reports "My pineapple"(when asked if anything hurts)  Objective: Vital signs in last 24 hours: Temp:  [98.4 F (36.9 C)-98.7 F (37.1 C)] 98.6 F (37 C) (03/07 0800) Pulse Rate:  [81-103] 85 (03/07 1000) Resp:  [12-24] 12 (03/07 1000) BP: (117-177)/(72-110) 152/87 (03/07 1000) SpO2:  [98 %-100 %] 100 % (03/07 1000)  Intake/Output from previous day: 03/06 0701 - 03/07 0700 In: 1440 [I.V.:1440] Out: 1175 [Urine:1175] Intake/Output this shift: Total I/O In: 300 [P.O.:120; I.V.:180] Out: -   Awake, cooperative with exam but requires repetition and encouragement. States name and hospital location, but remains confused. Moves LLE today on command, weak.   Lab Results: No results for input(s): WBC, HGB, HCT, PLT in the last 72 hours. BMET Recent Labs    05/26/17 0005 05/26/17 0559  NA 148* 148*    Studies/Results: No results found.  Assessment/Plan:   LOS: 13 days  Per DrStern, d/c NaCl 3%, request psychiatric consult to evaluate danger to self/others, transfer to floor. Orders entered.    Georgiann Cockeroteat, Brian 05/26/2017, 10:22 AM   Patient is better neurologically.  Discontinue 3 % Na Cl and transfer to floor.
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
## 2017-05-27 LAB — SODIUM
SODIUM: 144 mmol/L (ref 135–145)
SODIUM: 145 mmol/L (ref 135–145)
Sodium: 144 mmol/L (ref 135–145)
Sodium: 142 mmol/L (ref 135–145)

## 2017-05-27 LAB — GLUCOSE, CAPILLARY
GLUCOSE-CAPILLARY: 90 mg/dL (ref 65–99)
GLUCOSE-CAPILLARY: 91 mg/dL (ref 65–99)
GLUCOSE-CAPILLARY: 92 mg/dL (ref 65–99)
Glucose-Capillary: 81 mg/dL (ref 65–99)
Glucose-Capillary: 123 mg/dL — ABNORMAL HIGH (ref 65–99)

## 2017-05-27 MED ORDER — LEVETIRACETAM IN NACL 500 MG/100ML IV SOLN
500.0000 mg | Freq: Two times a day (BID) | INTRAVENOUS | Status: DC
  Administered 2017-05-27 – 2017-05-31 (×8): 500 mg via INTRAVENOUS
  Filled 2017-05-27 (×8): qty 100

## 2017-05-27 NOTE — Progress Notes (Addendum)
Subjective: Patient reports "[facility] I fell...don't remember"  Objective: Vital signs in last 24 hours: Temp:  [97.8 F (36.6 C)-98.8 F (37.1 C)] 97.8 F (36.6 C) (03/08 0756) Pulse Rate:  [86-98] 96 (03/08 0756) Resp:  [17-20] 17 (03/08 0756) BP: (144-159)/(73-104) 156/104 (03/08 0756) SpO2:  [97 %-100 %] 97 % (03/08 0756)  Intake/Output from previous day: 03/07 0701 - 03/08 0700 In: 333 [P.O.:120; I.V.:213] Out: 950 [Urine:950] Intake/Output this shift: No intake/output data recorded.  More alert today, cooperates with exam. Left hemiplegia. Confusion persists.   Lab Results: No results for input(s): WBC, HGB, HCT, PLT in the last 72 hours. BMET Recent Labs    05/27/17 0133 05/27/17 0557  NA 144 145    Studies/Results: No results found.  Assessment/Plan:   LOS: 14 days  Continue support, therapies, with plan for SNF.   Georgiann Cockeroteat, Brian 05/27/2017, 11:18 AM  Patient is improving.  Plan transfer to SNF when bed available.

## 2017-05-27 NOTE — Progress Notes (Signed)
CSW following for discharge plan. CS[facility] for [facility] potential LOG placement. Shaquenia with Admissions came to see the patient and has forwarded clinical information to administration for review, will let CSW know if they are able to offer a bed for LOG placement.  CSW will continue to follow. Patient has no bed offers at this time due to no active insurance.  Blenda NicelyElizabeth Jerrick Farve, [place] Clinical Social Worker [phone]

## 2017-05-27 NOTE — Progress Notes (Signed)
  Speech Language Pathology Treatment: Dysphagia  Patient Details Name: Christopher ReichertManuel Shaffer MRN: [id number] DOB: Nov 13, 1965 Today's Date: 05/27/2017 Time: [id number] SLP Time Calculation (min) (ACUTE ONLY): 13 min  Assessment / Plan / Recommendation Clinical Impression  Assisted pt with am meal with decreased reliance on total assist positioning. Pt able to partially tuck chin with verbal cues. When allowed to full self feed with regulation of bolus size pt did have a coughing episode. Suspect ongoing delay in swallow intiaition though assist and chin tuck position continues to be beneficial. Facilitated expression of wants and needs with choice cues and repetition as pt is often calling out for help but does not specify needs. Will continue to follow.   HPI HPI: Patient is a 52 yo male, chronic alcoholic with right ICH, small SDH and skull fracture with right to left shift increased from 6 to 8mm per CT 05/14/17. Thrombocytopenia with likely poorly functioning platelets. Left hemiparesis with left neglect and loss of vision on left.      SLP Plan  Continue with current plan of care       Recommendations  Diet recommendations: Dysphagia 1 (puree);Nectar-thick liquid Liquids provided via: Cup Medication Administration: Crushed with puree Supervision: Staff to assist with self feeding;Full supervision/cueing for compensatory strategies Compensations: Chin tuck;Slow rate Postural Changes and/or Swallow Maneuvers: Seated upright 90 degrees;Upright 30-60 min after meal;Chin tuck                Oral Care Recommendations: Oral care QID Follow up Recommendations: Skilled Nursing facility SLP Visit Diagnosis: Attention and concentration deficit;Frontal lobe and executive function deficit;Cognitive communication deficit (R41.841) Plan: Continue with current plan of care       GO                Nekeshia Lenhardt, Riley NearingBonnie Caroline 05/27/2017, 11:11 AM
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
## 2017-05-28 LAB — GLUCOSE, CAPILLARY
GLUCOSE-CAPILLARY: 93 mg/dL (ref 65–99)
Glucose-Capillary: 140 mg/dL — ABNORMAL HIGH (ref 65–99)
Glucose-Capillary: 81 mg/dL (ref 65–99)
Glucose-Capillary: 112 mg/dL — ABNORMAL HIGH (ref 65–99)
Glucose-Capillary: 106 mg/dL — ABNORMAL HIGH (ref 65–99)
Glucose-Capillary: 125 mg/dL — ABNORMAL HIGH (ref 65–99)

## 2017-05-28 LAB — SODIUM
SODIUM: 140 mmol/L (ref 135–145)
SODIUM: 140 mmol/L (ref 135–145)
Sodium: 138 mmol/L (ref 135–145)
Sodium: 136 mmol/L (ref 135–145)

## 2017-05-28 NOTE — Progress Notes (Signed)
Neurosurgery Progress Note  No issues overnight. States "I'm at [facility]" repeatedly  EXAM:  BP (!) 157/89 (BP Location: Left Arm)   Pulse 97   Temp 98 F (36.7 C) (Axillary)   Resp 18   Ht 6' (1.829 m)   Wt 96 kg (211 lb 10.3 oz)   SpO2 97%   BMI 28.70 kg/m   Alert to self and location but otherwise remains confused Follows commands intermittently with encouragement Left hemiplegia  PLAN Relatively stable this am Continue current treatment Waiting on bed placement for SNF

## 2017-05-29 LAB — BASIC METABOLIC PANEL
Anion gap: 7 (ref 5–15)
BUN: 6 mg/dL (ref 6–20)
CO2: 23 mmol/L (ref 22–32)
Calcium: 8.3 mg/dL — ABNORMAL LOW (ref 8.9–10.3)
Chloride: 102 mmol/L (ref 101–111)
Creatinine, Ser: 0.59 mg/dL — ABNORMAL LOW (ref 0.61–1.24)
GFR calc Af Amer: >60 mL/min (ref >60)
GFR calc non Af Amer: >60 mL/min (ref >60)
Glucose, Bld: 99 mg/dL (ref 65–99)
Potassium: 3.9 mmol/L (ref 3.5–5.1)
Sodium: 132 mmol/L — ABNORMAL LOW (ref 135–145)

## 2017-05-29 LAB — GLUCOSE, CAPILLARY
GLUCOSE-CAPILLARY: 97 mg/dL (ref 65–99)
Glucose-Capillary: 107 mg/dL — ABNORMAL HIGH (ref 65–99)
Glucose-Capillary: 154 mg/dL — ABNORMAL HIGH (ref 65–99)
Glucose-Capillary: 158 mg/dL — ABNORMAL HIGH (ref 65–99)
Glucose-Capillary: 90 mg/dL (ref 65–99)
Glucose-Capillary: 98 mg/dL (ref 65–99)

## 2017-05-29 LAB — CBC WITH DIFFERENTIAL/PLATELET
BASOS ABS: 0 10*3/uL (ref 0.0–0.1)
BASOS PCT: 0 %
EOS ABS: 0.1 10*3/uL (ref 0.0–0.7)
Eosinophils Relative: 1 %
HCT: 36.5 % — ABNORMAL LOW (ref 39.0–52.0)
Hemoglobin: 11.9 g/dL — ABNORMAL LOW (ref 13.0–17.0)
Lymphocytes Relative: 26 %
Lymphs Abs: 2.2 10*3/uL (ref 0.7–4.0)
MCH: 33.3 pg (ref 26.0–34.0)
MCHC: 32.6 g/dL (ref 30.0–36.0)
MCV: 102.2 fL — ABNORMAL HIGH (ref 78.0–100.0)
MONOS PCT: 8 %
Monocytes Absolute: 0.7 10*3/uL (ref 0.1–1.0)
NEUTROS PCT: 65 %
Neutro Abs: 5.4 10*3/uL (ref 1.7–7.7)
Platelets: 221 10*3/uL (ref 150–400)
RBC: 3.57 MIL/uL — AB (ref 4.22–5.81)
RDW: 13.6 % (ref 11.5–15.5)
WBC: 8.4 10*3/uL (ref 4.0–10.5)

## 2017-05-29 LAB — SEDIMENTATION RATE: Sed Rate: 78 mm/h — ABNORMAL HIGH (ref 0–16)

## 2017-05-29 LAB — SODIUM
SODIUM: 138 mmol/L (ref 135–145)
Sodium: 137 mmol/L (ref 135–145)

## 2017-05-29 LAB — URIC ACID: URIC ACID, SERUM: 3.3 mg/dL — AB (ref 4.4–7.6)

## 2017-05-29 MED ORDER — FOLIC ACID 1 MG PO TABS
1.0000 mg | ORAL_TABLET | Freq: Every day | ORAL | Status: DC
  Administered 2017-05-30 – 2017-07-26 (×57): 1 mg via ORAL
  Filled 2017-05-29 (×58): qty 1

## 2017-05-29 MED ORDER — INSULIN ASPART 100 UNIT/ML ~~LOC~~ SOLN
0.0000 [IU] | Freq: Three times a day (TID) | SUBCUTANEOUS | Status: DC
  Administered 2017-05-29: 3 [IU] via SUBCUTANEOUS
  Administered 2017-05-30 – 2017-05-31 (×2): 2 [IU] via SUBCUTANEOUS
  Administered 2017-05-31: 3 [IU] via SUBCUTANEOUS
  Administered 2017-06-02: 1 [IU] via SUBCUTANEOUS
  Administered 2017-06-02: 3 [IU] via SUBCUTANEOUS
  Administered 2017-06-03: 2 [IU] via SUBCUTANEOUS
  Administered 2017-06-06: 3 [IU] via SUBCUTANEOUS
  Administered 2017-06-07 – 2017-06-21 (×14): 2 [IU] via SUBCUTANEOUS
  Administered 2017-06-22: 3 [IU] via SUBCUTANEOUS
  Administered 2017-06-23 – 2017-06-26 (×6): 2 [IU] via SUBCUTANEOUS
  Administered 2017-06-26: 3 [IU] via SUBCUTANEOUS
  Administered 2017-06-29: 2 [IU] via SUBCUTANEOUS
  Administered 2017-06-29 (×2): 3 [IU] via SUBCUTANEOUS
  Administered 2017-06-29 – 2017-07-01 (×3): 2 [IU] via SUBCUTANEOUS
  Administered 2017-07-03: 3 [IU] via SUBCUTANEOUS
  Administered 2017-07-04 – 2017-07-06 (×4): 2 [IU] via SUBCUTANEOUS
  Administered 2017-07-06: 3 [IU] via SUBCUTANEOUS
  Administered 2017-07-07 – 2017-07-19 (×13): 2 [IU] via SUBCUTANEOUS
  Administered 2017-07-19: 3 [IU] via SUBCUTANEOUS
  Administered 2017-07-20 – 2017-07-21 (×3): 2 [IU] via SUBCUTANEOUS
  Administered 2017-07-22 (×2): 3 [IU] via SUBCUTANEOUS
  Administered 2017-07-23 – 2017-07-25 (×3): 2 [IU] via SUBCUTANEOUS
  Administered 2017-07-26: 5 [IU] via SUBCUTANEOUS

## 2017-05-29 MED ORDER — VITAMIN B-1 100 MG PO TABS
100.0000 mg | ORAL_TABLET | Freq: Every day | ORAL | Status: DC
  Administered 2017-05-30 – 2017-07-26 (×57): 100 mg via ORAL
  Filled 2017-05-29 (×58): qty 1

## 2017-05-29 MED ORDER — PANTOPRAZOLE SODIUM 40 MG PO PACK
40.0000 mg | PACK | Freq: Every day | ORAL | Status: DC
  Administered 2017-05-30 – 2017-07-26 (×55): 40 mg via ORAL
  Filled 2017-05-29 (×58): qty 20

## 2017-05-29 NOTE — Progress Notes (Signed)
Received call from [place], RN regarding patient complaining of right elbow pain. Ordered CBC Uric acid and BMET all of which are fairly unremarkable. ESR pending.  Came to evaluate patient. Right Elbow has minimal swelling at olecranon. Slightly TTP. No redness, increased warmth. Does not appear like gout or cellulitis. Likely just bursitis. Continue to monitor. Report any change.

## 2017-05-29 NOTE — Progress Notes (Signed)
Patient ID: Christopher ReichertManuel Shaffer, male   DOB: 03/10/1966, 52 y.o.   MRN: [id number]  patient resting comfortably, left hemiplegia, no change in exam, awaiting skilled nursing facility

## 2017-05-29 NOTE — Progress Notes (Addendum)
Patient complained of right elbow pain, upon assessment elbow was swollen, red and hot to touch. On-call neurosurgery paged, uric acid and cbc labs were ordered to r/o gout.

## 2017-05-30 ENCOUNTER — Inpatient Hospital Stay (HOSPITAL_COMMUNITY): Payer: Medicaid Other

## 2017-05-30 LAB — URINALYSIS, ROUTINE W REFLEX MICROSCOPIC
Bilirubin Urine: NEGATIVE
Glucose, UA: NEGATIVE mg/dL
KETONES UR: NEGATIVE mg/dL
Nitrite: POSITIVE — AB
PROTEIN: NEGATIVE mg/dL
SQUAMOUS EPITHELIAL / LPF: NONE SEEN
Specific Gravity, Urine: 1.02 (ref 1.005–1.030)
pH: 7 (ref 5.0–8.0)

## 2017-05-30 LAB — GLUCOSE, CAPILLARY
GLUCOSE-CAPILLARY: 73 mg/dL (ref 65–99)
Glucose-Capillary: 133 mg/dL — ABNORMAL HIGH (ref 65–99)
Glucose-Capillary: 127 mg/dL — ABNORMAL HIGH (ref 65–99)

## 2017-05-30 LAB — C-REACTIVE PROTEIN: CRP: 1.9 mg/dL — AB (ref <1.0)

## 2017-05-30 NOTE — Progress Notes (Signed)
Physical Therapy Treatment Patient Details Name: [name] MRN: [id number] DOB: Oct 12, 1965 Today's Date: 05/30/2017    History of Present Illness Patient is a 52 yo male, chronic alcoholic admitted after being found face down with evidence of head injury.  CT of head showed acute depressed Rt fronto-temporal skull fx with frontotemporal confluent acute hemorrhagic contusions, small Lt frontal lobe contusion, 6mm Rt > Lt midline shift with Rt uncal herniation, Rt to Lt ventricular entrapments as well ass R-IVH, SAH, and B-SDH.   He was also found ot have questionable of cortical avulsion fracture of dorsal aspect of the Lt olecranon possible at insertion of triceps tendon (Lt UE placed in sling).  Other significant PMH includes ETOH, CKD, HTN, AAA, DM, and h/o prior traumatic SAH     PT Comments    Pt continues to be very distractable and pushes hard to the left in sitting.  With facilitation and multimodal cues he was able to lean over to his right elbow today multiple times and come up to sitting.  Today's session made me wonder if he would at all respond to some biofeedback with the mirror. PT will continue to follow acutely.    Follow Up Recommendations  SNF     Equipment Recommendations  Wheelchair cushion (measurements PT);Wheelchair (measurements PT);Hospital bed    Recommendations for Other Services   NA     Precautions / Restrictions Precautions Precautions: Fall Precaution Comments: TBI Required Braces or Orthoses: Sling Restrictions Weight Bearing Restrictions: Yes LUE Weight Bearing: Non weight bearing    Mobility  Bed Mobility Overal bed mobility: Needs Assistance Bed Mobility: Supine to Sit;Sit to Supine     Supine to sit: HOB elevated;Max assist Sit to supine: HOB elevated;Mod assist   General bed mobility comments: Pt needed max assist to progress bil legs and his trunk up to sitting with auditory and visual cues to reach for the foot board at the end  of the bed.  I kept HOB up so that pt would not fall over when he let go of the end of the bed and hand over hand assist needed to maintain grip as once his attention was gone, so was his grip.  Pt let gravity control his trunk down to the bed (uncontrolled) and attempted to lift his right leg into the bed.  Assist needed to get both legs safely back.  Transfers                 General transfer comment: still unable to attempt due to significantly decreased sitting balance.        Modified Rankin (Stroke Patients Only) Modified Rankin (Stroke Patients Only) Pre-Morbid Rankin Score: No symptoms Modified Rankin: Severe disability     Balance Overall balance assessment: Needs assistance Sitting-balance support: Feet supported;Single extremity supported Sitting balance-Leahy Scale: Poor Sitting balance - Comments: Needs mod to max assist to maintain sitting balance with right hand reaching towards the foot board.  In sitting worked on command following, sustained attention, and transitions down to his right elbow and back up multiple times.   Postural control: Left lateral lean;Posterior lean                                  Cognition Arousal/Alertness: Lethargic Behavior During Therapy: Flat affect Overall Cognitive Status: Impaired/Different from baseline Area of Impairment: Orientation;Attention;Memory;Following commands;Safety/judgement;Awareness;Problem solving;Rancho level  Rancho Levels of Cognitive Functioning Rancho Los Amigos Scales of Cognitive Functioning: Confused/inappropriate/non-agitated Orientation Level: Disoriented to;Time Current Attention Level: Sustained(breif) Memory: Decreased recall of precautions;Decreased short-term memory Following Commands: Follows one step commands inconsistently Safety/Judgement: Decreased awareness of safety;Decreased awareness of deficits Awareness: Intellectual Problem Solving: Decreased  initiation;Slow processing;Difficulty sequencing;Requires verbal cues;Requires tactile cues General Comments: Pt followed more commands for me today given increased time and multimodal cues for initiation and sequencing.  He was able to follow some commands with cues for attention.  He is perseverating today on BM and scratching his back.              Pertinent Vitals/Pain Pain Assessment: Faces Faces Pain Scale: Hurts even more Pain Location: left elbow Pain Descriptors / Indicators: Grimacing;Guarding Pain Intervention(s): Limited activity within patient's tolerance;Monitored during session;Repositioned           PT Goals (current goals can now be found in the care plan section) Acute Rehab PT Goals Patient Stated Goal: none stated Progress towards PT goals: Progressing toward goals    Frequency    Min 3X/week      PT Plan Current plan remains appropriate       AM-PAC PT "6 Clicks" Daily Activity  Outcome Measure  Difficulty turning over in bed (including adjusting bedclothes, sheets and blankets)?: Unable Difficulty moving from lying on back to sitting on the side of the bed? : Unable Difficulty sitting down on and standing up from a chair with arms (e.g., wheelchair, bedside commode, etc,.)?: Unable Help needed moving to and from a bed to chair (including a wheelchair)?: Total Help needed walking in hospital room?: Total Help needed climbing 3-5 steps with a railing? : Total 6 Click Score: 6    End of Session Equipment Utilized During Treatment: Other (comment)(left arm sling) Activity Tolerance: Patient tolerated treatment well Patient left: in bed;with call bell/phone within reach;with nursing/sitter in room;Other (comment)(RN tech in room as pt reports he needs to have a BM)   PT Visit Diagnosis: Muscle weakness (generalized) (M62.81);History of falling (Z91.81);Difficulty in walking, not elsewhere classified (R26.2);Other symptoms and signs involving the  nervous system (Z61.096(R29.898)     Time: [id number] PT Time Calculation (min) (ACUTE ONLY): 39 min  Charges:  $Therapeutic Activity: 23-37 mins $Neuromuscular Re-education: 8-22 mins          Quenisha Lovins B. Johann Santone, PT, DPT [phone]            05/30/2017, 2:43 PM

## 2017-05-30 NOTE — Progress Notes (Signed)
Patient ID: Christopher ReichertManuel Shaffer, male   DOB: [birth year], 52 y.o.   MRN: [id number] Subjective: Patient reports R elbow "itching"  Objective: Vital signs in last 24 hours: Temp:  [98.1 F (36.7 C)-98.9 F (37.2 C)] 98.1 F (36.7 C) (03/11 0457) Pulse Rate:  [84-109] 95 (03/11 0457) Resp:  [18] 18 (03/11 0457) BP: (125-153)/(78-90) 153/83 (03/11 0457) SpO2:  [98 %-99 %] 99 % (03/11 0457)  Intake/Output from previous day: 03/10 0701 - 03/11 0700 In: 840 [P.O.:840] Out: -  Intake/Output this shift: No intake/output data recorded.  Wake and conversant, L hemiplegic, R elbow with no redness or swelling  Lab Results: Lab Results  Component Value Date   WBC 8.4 05/29/2017   HGB 11.9 (L) 05/29/2017   HCT 36.5 (L) 05/29/2017   MCV 102.2 (H) 05/29/2017   PLT 221 05/29/2017   Lab Results  Component Value Date   INR 1.38 05/15/2017   BMET Lab Results  Component Value Date   NA 132 (L) 05/29/2017   K 3.9 05/29/2017   CL 102 05/29/2017   CO2 23 05/29/2017   GLUCOSE 99 05/29/2017   BUN 6 05/29/2017   CREATININE 0.59 (L) 05/29/2017   CALCIUM 8.3 (L) 05/29/2017    Studies/Results: No results found.  Assessment/Plan: Stable, ? R elbow issues - labs reviewed, will xray and check U/A (sed rate up)  Estimated body mass index is 28.7 kg/m as calculated from the following:   Height as of this encounter: 6' (1.829 m).   Weight as of this encounter: 96 kg (211 lb 10.3 oz).    LOS: 17 days    [name] S 05/30/2017, 8:18 AM

## 2017-05-30 NOTE — Progress Notes (Signed)
CSW following for discharge plan. CSW checked in with [facility]missions at [facility] to discuss whether administration has approved patient for LOG placement. Per Admissions, patient is still under review; no decision as of yet.  CSW will continue to follow. CSW will expand bed search for LOG placement outside of the [place] area.  Blenda NicelyElizabeth Pieper Kas[place], [place] Clinical Social Worker [phone]

## 2017-05-31 LAB — GLUCOSE, CAPILLARY
Glucose-Capillary: 87 mg/dL (ref 65–99)
Glucose-Capillary: 152 mg/dL — ABNORMAL HIGH (ref 65–99)
Glucose-Capillary: 135 mg/dL — ABNORMAL HIGH (ref 65–99)
Glucose-Capillary: 116 mg/dL — ABNORMAL HIGH (ref 65–99)

## 2017-05-31 MED ORDER — BISACODYL 10 MG RE SUPP
10.0000 mg | Freq: Every day | RECTAL | Status: DC | PRN
  Administered 2017-05-31: 10 mg via RECTAL
  Filled 2017-05-31: qty 1

## 2017-05-31 MED ORDER — LEVETIRACETAM 500 MG PO TABS
500.0000 mg | ORAL_TABLET | Freq: Two times a day (BID) | ORAL | Status: DC
  Administered 2017-05-31 – 2017-06-13 (×25): 500 mg via ORAL
  Filled 2017-05-31 (×27): qty 1

## 2017-05-31 NOTE — Progress Notes (Signed)
  Speech Language Pathology Treatment: Cognitive-Linquistic  Patient Details Name: Christopher ReichertManuel Shaffer MRN: [id number] DOB: 03-17-1966 Today's Date: 05/31/2017 Time: 1000-1010 SLP Time Calculation (min) (ACUTE ONLY): 10 min  Assessment / Plan / Recommendation Clinical Impression  Pt was seen for skilled ST targeting cognitive goals.  Pt was alert, confused, and very perseverative today.  Perseverations ranged from wanting to sleep, stating the phrase "Please help me" repeatedly, and calling out for [name] ([facility]    Pt needed total assist for redirection, specifically for use of therapist's name tag to reorient to proper name.  Pt requested to be washed due to feeling "dirty" and needed encouragement to wash his hands and brush his teeth himself rather than having therapist do it for him.  Pt was oriented to place and situation with supervision and needed min assist to reorient to date.  POs were not attempted on this date as pt had just finished breakfast with OT, who reported minimal coughing but significant cuing needed to attend to meal.  Pt was left in bed with bed alarm set and mitt in place as pt was attempting to remove condom cath upon therapist's arrival when mitt was removed.    HPI HPI: Patient is a 52 yo male, chronic alcoholic with right ICH, small SDH and skull fracture with right to left shift increased from 6 to 8mm per CT 05/14/17. Thrombocytopenia with likely poorly functioning platelets. Left hemiparesis with left neglect and loss of vision on left.      SLP Plan  Continue with current plan of care       Recommendations  Diet recommendations: Dysphagia 1 (puree);Nectar-thick liquid Liquids provided via: Cup Medication Administration: Crushed with puree Supervision: Staff to assist with self feeding;Full supervision/cueing for compensatory strategies Compensations: Chin tuck;Slow rate Postural Changes and/or  Swallow Maneuvers: Seated upright 90 degrees;Upright 30-60 min after meal;Chin tuck                Oral Care Recommendations: Oral care QID Follow up Recommendations: Skilled Nursing facility SLP Visit Diagnosis: Attention and concentration deficit;Frontal lobe and executive function deficit;Cognitive communication deficit (R41.841) Plan: Continue with current plan of care       GO                PageMelanee Spry, Venus Ruhe L 05/31/2017, 10:17 AM

## 2017-05-31 NOTE — Progress Notes (Signed)
PHARMACIST - PHYSICIAN COMMUNICATION   CONCERNING: IV to Oral Route Change Policy  RECOMMENDATION: This patient is receiving Keppra by the intravenous route.  Based on criteria approved by the Pharmacy and Therapeutics Committee, the intravenous medication(s) is/are being converted to the equivalent oral dose form(s).   DESCRIPTION: These criteria include:  The patient is eating (either orally or via tube) and/or has been taking other orally administered medications for a least 24 hours  The patient has no evidence of active gastrointestinal bleeding or impaired GI absorption (gastrectomy, short bowel, patient on TNA or NPO).  If you have questions about this conversion, please contact the Pharmacy Department  []   [phone]( [phone] )  [facility] []   [phone]( [phone] )  [facility] [x]   [phone]( [phone] )  [facility] []   [phone]( [phone] )  [facility] []   [phone]( [phone] )  [facility]   Benny LennertMeyer, Magdalen Cabana David, [facility] 05/31/2017 9:56 AM

## 2017-05-31 NOTE — Care Management Note (Signed)
Case Management Note  Patient Details  Name: Christopher ReichertManuel Shaffer MRN: [id number] Date of Birth: 1965-09-16  Subjective/Objective:                    Action/Plan: Awaiting SNF placement. CM following for d/c disposition.   Expected Discharge Date:  05/20/17               Expected Discharge Plan:  Skilled Nursing Facility  In-House Referral:  Clinical Social Work  Discharge planning Services  CM Consult  Post Acute Care Choice:    Choice offered to:     DME Arranged:    DME Agency:     HH Arranged:    HH Agency:     Status of Service:  In process, will continue to follow  If discussed at Long Length of Stay Meetings, dates discussed:    Additional Comments:  [name] F Kamonte Mcmichen, RN 05/31/2017, 12:36 PM

## 2017-05-31 NOTE — Progress Notes (Signed)
Occupational Therapy Treatment Patient Details Name: [name] MRN: [id number] DOB: [birth year] Today's Date: 05/31/2017    History of present illness Patient is a 52 yo male, chronic alcoholic admitted after being found face down with evidence of head injury.  CT of head showed acute depressed Rt fronto-temporal skull fx with frontotemporal confluent acute hemorrhagic contusions, small Lt frontal lobe contusion, 6mm Rt > Lt midline shift with Rt uncal herniation, Rt to Lt ventricular entrapments as well ass R-IVH, SAH, and B-SDH.   He was also found ot have questionable of cortical avulsion fracture of dorsal aspect of the Lt olecranon possible at insertion of triceps tendon (Lt UE placed in sling).  Other significant PMH includes ETOH, CKD, HTN, AAA, DM, and h/o prior traumatic SAH    OT comments  Pt making very slow improvements with adls by following more commands and pt is more alert and participatory in therapy .  Pt limited greatly by perseveration, decreased attn span and poor problem solving. Pt would benefit from some OOB time but unsure how safe he will be in chair.  Will discuss with PT and nursing for a plan to get pt OOB.  Pt does not have assist at home.  Feel SNF is best option at this point.  Follow Up Recommendations  SNF    Equipment Recommendations  None recommended by OT    Recommendations for Other Services      Precautions / Restrictions Precautions Precautions: Fall Precaution Comments: TBI Restrictions Weight Bearing Restrictions: Yes LUE Weight Bearing: Non weight bearing       Mobility Bed Mobility Overal bed mobility: Needs Assistance Bed Mobility: Rolling;Sidelying to Sit;Sit to Sidelying Rolling: Mod assist Sidelying to sit: Total assist   Sit to supine: HOB elevated;Mod assist   General bed mobility comments: Pt rolled to L with VCs only today but requires max assist to go to R due to hemiplegia.  Pt sat on side of bed briefly with total  assist but was heavily pushing to the Left.  Pt washed face/neck in sitting with total assist for sitting while grooming. Pt returned to supine with mod assist putting R leg behind Left.  Transfers                      Balance Overall balance assessment: Needs assistance Sitting-balance support: Feet supported;Single extremity supported Sitting balance-Leahy Scale: Poor Sitting balance - Comments: Needs mod to max assist to maintain sitting balance with right hand reaching towards the foot board.  In sitting worked on command following, sustained attention, and transitions down to his right elbow and back up multiple times.   Postural control: Left lateral lean;Posterior lean     Standing balance comment: could not attempt safely due to poor sitting balance.                           ADL either performed or assessed with clinical judgement   ADL Overall ADL's : Needs assistance/impaired Eating/Feeding: Moderate assistance Eating/Feeding Details (indicate cue type and reason): Pt did not require hand over hand assist today but did require constant cues to stay on task and a tap on the hand to continue taking each bite of food. Pt very easily distracted. Grooming: Wash/dry hands;Wash/dry face;Minimal assistance;Sitting Grooming Details (indicate cue type and reason): Pt required VCs to initiate the task and constant cues to finish the task.  Pt easily distracted perseverated on "can you help me"  during entire session.  Pt difficult to redirect today.                             Functional mobility during ADLs: Maximal assistance;+2 for physical assistance General ADL Comments: Pt following more commands during adls but requires constant cues to stay on task. Pt easily distracted and perseverates greatly with difficulty redirecting.     Vision   Vision Assessment?: Vision impaired- to be further tested in functional context Additional Comments: Pt unable to  name number of fingers in front of face.  Pt did scan to L and R but neglects L side of environment.   Perception     Praxis      Cognition Arousal/Alertness: Awake/alert Behavior During Therapy: Restless Overall Cognitive Status: Impaired/Different from baseline Area of Impairment: Orientation;Attention;Memory;Following commands;Safety/judgement;Awareness;Problem solving               Rancho Levels of Cognitive Functioning Rancho Los Amigos Scales of Cognitive Functioning: Confused/inappropriate/non-agitated Orientation Level: Disoriented to;Time Current Attention Level: Sustained Memory: Decreased recall of precautions;Decreased short-term memory Following Commands: Follows one step commands consistently Safety/Judgement: Decreased awareness of safety;Decreased awareness of deficits Awareness: Intellectual Problem Solving: Decreased initiation;Slow processing;Difficulty sequencing;Requires verbal cues;Requires tactile cues General Comments: Pt following one step commands consistently today.        Exercises     Shoulder Instructions       General Comments Pt appears to slowly be improving with cognition, following commands more consistently and answering some questions appropriately.  Pt with severe perseveration and attn. span deficits making focusing on adls difficult.    Pertinent Vitals/ Pain       Pain Assessment: Faces Faces Pain Scale: Hurts little more Pain Location: Left elbow and hand Pain Descriptors / Indicators: Grimacing;Guarding Pain Intervention(s): Limited activity within patient's tolerance;Monitored during session;Repositioned  Home Living                                          Prior Functioning/Environment              Frequency  Min 3X/week        Progress Toward Goals  OT Goals(current goals can now be found in the care plan section)  Progress towards OT goals: Progressing toward goals  Acute Rehab OT  Goals Patient Stated Goal: none stated OT Goal Formulation: Patient unable to participate in goal setting Time For Goal Achievement: 06/14/17 Potential to Achieve Goals: Fair ADL Goals Pt Will Perform Eating: with min assist;sitting Pt Will Perform Grooming: with min assist;sitting Pt Will Perform Upper Body Bathing: with mod assist;sitting Pt Will Transfer to Toilet: with mod assist;with +2 assist;bedside commode Additional ADL Goal #1: Pt will sustain attention to simple ADL task x 4 mins with min cues Additional ADL Goal #2: Pt will demonstrates intellectual awareness of deficits  Plan Discharge plan remains appropriate    Co-evaluation                 AM-PAC PT "6 Clicks" Daily Activity     Outcome Measure   Help from another person eating meals?: A Lot Help from another person taking care of personal grooming?: A Lot Help from another person toileting, which includes using toliet, bedpan, or urinal?: Total Help from another person bathing (including washing, rinsing, drying)?: Total Help from another person to put  on and taking off regular upper body clothing?: Total Help from another person to put on and taking off regular lower body clothing?: Total 6 Click Score: 8    End of Session    OT Visit Diagnosis: Unsteadiness on feet (R26.81);Cognitive communication deficit (R41.841)   Activity Tolerance     Patient Left in bed;with call bell/phone within reach;with bed alarm set;with restraints reapplied   Nurse Communication Mobility status        Time: 1610-9604 OT Time Calculation (min): 19 min  Charges: OT General Charges $OT Visit: 1 Visit OT Treatments $Self Care/Home Management : 8-22 mins  Tory Emerald, OTR/L [phone]   Hope Budds 05/31/2017, 10:00 AM

## 2017-05-31 NOTE — Progress Notes (Signed)
Patient ID: Christopher ReichertManuel Shaffer, male   DOB: 1965-05-19, 52 y.o.   MRN: [id number] Pt without c/o today, neuro stable, awaiting snf crp 1.9 U/a ok - await culture Elbow xray stable

## 2017-05-31 NOTE — Progress Notes (Signed)
CSW following for discharge plan. [facility]. Per Medical Director, patient will need to be referred for financial counseling, as his insurance is no longer active due to being fired mid-February. Patient's financial information will need to be figured out in order to determine his eligibility for either Medicaid or Disability before disposition can be determined, as patient will end up needing placed long term as he will likely not recover his independence due to traumatic brain injury.  CSW sent patient information to [employer] officerinancial Counseling officer. Per Financial Counseling, patient will need [facility] to initiate a disability application. Once the patient is approved for disability, then he would receive Medicaid benefits. Financial Counseling to make referral, and [facility] representative will reach out to patient's wife.  CSW will continue to follow.  Blenda NicelyElizabeth Majid Mccravy, [place] Clinical Social Worker [phone]

## 2017-05-31 NOTE — Progress Notes (Signed)
CSW contacted by patient's wife who requested update on patient's status. CSW provided update to wife. CSW to continue to keep wife informed of progress.  Blenda NicelyElizabeth Serina Nichter, [place] Clinical Social Worker [phone]

## 2017-06-01 LAB — GLUCOSE, CAPILLARY
GLUCOSE-CAPILLARY: 97 mg/dL (ref 65–99)
Glucose-Capillary: 97 mg/dL (ref 65–99)
Glucose-Capillary: 117 mg/dL — ABNORMAL HIGH (ref 65–99)
Glucose-Capillary: 141 mg/dL — ABNORMAL HIGH (ref 65–99)

## 2017-06-01 NOTE — Progress Notes (Signed)
Per financial counseling they are unable to file for disability/ medicaid since the patient currently has active insurance. His insurance will lapse at d/c and he will not have insurance coverage for rehab.  CM reached out to his estranged wife and she is going to go tomorrow and file for disability and medicaid for the patient at DSS.  CSW updated and CM is following.

## 2017-06-01 NOTE — Progress Notes (Signed)
Nutrition Follow-up  DOCUMENTATION CODES:   Not applicable  INTERVENTION:  Ensure Enlive po TID, each supplement provides 350 kcal and 20 grams of protein  Recommend obtain new weight  NUTRITION DIAGNOSIS:   Inadequate oral intake related to dysphagia, lethargy/confusion as evidenced by meal completion < 50%. -ongoing  GOAL:   Patient will meet greater than or equal to 90% of their needs -unmet  MONITOR:   PO intake, Supplement acceptance, Diet advancement, Weight trends  ASSESSMENT:   Pt with PMH of ETOH abuse, previous TBI, AAA, CKD, HTN, with frequent visit to ER admitted 2/22 (found down with head injury) with R ICH, small SDH, skull fx, L hemiparesis, L neglect and loss of vision on the left.   Patient was lethargic this morning from being up all night per sitter. Did not eat any breakfast. Meal completion prior to that averaging 68% and drinking 3 ensures daily. Clinically stable per neuro - awaiting SNF.  Labs reviewed:   Medications reviewed and include:  Senokot-S, Thiamine, Folic Acid  Diet Order:  Fall precautions DIET - DYS 1 Room service appropriate? Yes; Fluid consistency: Nectar Thick Suicide precautions  EDUCATION NEEDS:   No education needs have been identified at this time  Skin:  Skin Assessment: Reviewed RN Assessment Skin Integrity Issues:: Other (Comment) Other: non pressure wound to back  Last BM:  05/31/2017  Height:   Ht Readings from Last 1 Encounters:  05/13/17 6' (1.829 m)    Weight:   Wt Readings from Last 1 Encounters:  05/13/17 211 lb 10.3 oz (96 kg)    Ideal Body Weight:  80.9 kg  BMI:  Body mass index is 28.7 kg/m.  Estimated Nutritional Needs:   Kcal:  2100-2300  Protein:  125-140 grams  Fluid:  >2.1 L/day  Dionne AnoWilliam M. Shameika Speelman, MS, RD LDN Inpatient Clinical Dietitian Pager [phone]

## 2017-06-01 NOTE — Progress Notes (Signed)
Pt was confused and yelling for help most of the night when he had BM. Pt cleaned up and made comfortable.  At this morning almost at shift change pt yelling to call the police' when   questioned pt pt stated he will like to harm himself and othersand Pt confirm he has suicidal thought and intend harminghim self with a gun. On call neuro surgical person notified and an order for sitter and suicidal precaution.   Will continue to monitor pt closely

## 2017-06-01 NOTE — Progress Notes (Signed)
Physical Therapy Treatment Patient Details Name: Christopher ReichertManuel Shaffer MRN: [id number] DOB: 12/31/1965 Today's Date: 06/01/2017    History of Present Illness Patient is a 52 yo male, chronic alcoholic admitted after being found face down with evidence of head injury.  CT of head showed acute depressed Rt fronto-temporal skull fx with frontotemporal confluent acute hemorrhagic contusions, small Lt frontal lobe contusion, 6mm Rt > Lt midline shift with Rt uncal herniation, Rt to Lt ventricular entrapments as well ass R-IVH, SAH, and B-SDH.   He was also found ot have questionable of cortical avulsion fracture of dorsal aspect of the Lt olecranon possible at insertion of triceps tendon (Lt UE placed in sling).  Other significant PMH includes ETOH, CKD, HTN, AAA, DM, and h/o prior traumatic SAH     PT Comments    Pt was not as interactive today as in previous sessions.  He was perseverating on scratching his back and returning to bed.  He needed near constant redirection to task.  PT will continue to follow acutely with a goal of eventually getting him in standing.    Follow Up Recommendations  SNF;Supervision/Assistance - 24 hour     Equipment Recommendations  Wheelchair cushion (measurements PT);Wheelchair (measurements PT);Hospital bed    Recommendations for Other Services   NA     Precautions / Restrictions Precautions Precautions: Fall Precaution Comments: TBI Required Braces or Orthoses: Sling Restrictions Weight Bearing Restrictions: Yes LUE Weight Bearing: Non weight bearing    Mobility  Bed Mobility Overal bed mobility: Needs Assistance Bed Mobility: Supine to Sit;Sit to Supine     Supine to sit: +2 for physical assistance;Max assist Sit to supine: +2 for physical assistance;Max assist   General bed mobility comments: Two person max assist to transition to EOB and back into bed.  Support at trunk and legs.                         Modified Rankin (Stroke  Patients Only) Modified Rankin (Stroke Patients Only) Pre-Morbid Rankin Score: No symptoms Modified Rankin: Severe disability     Balance Overall balance assessment: Needs assistance Sitting-balance support: Feet supported;Single extremity supported Sitting balance-Leahy Scale: Poor Sitting balance - Comments: Needs mod to max assist to maintain sitting balance with right hand reaching towards the foot board.  Pt needed nearly every minute redirection to continue to hold himself up with the foot board rail seated EOB, he was internally distracted by needing to scratch his back. Postural control: Left lateral lean;Posterior lean                                  Cognition Arousal/Alertness: Lethargic Behavior During Therapy: Flat affect Overall Cognitive Status: Impaired/Different from baseline Area of Impairment: Orientation;Attention;Memory;Following commands;Safety/judgement;Awareness;Problem solving               Rancho Levels of Cognitive Functioning Rancho Los Amigos Scales of Cognitive Functioning: Confused/inappropriate/non-agitated Orientation Level: Disoriented to;Time Current Attention Level: Focused Memory: Decreased recall of precautions;Decreased short-term memory Following Commands: Follows one step commands inconsistently;Follows one step commands with increased time(and repetition) Safety/Judgement: Decreased awareness of safety;Decreased awareness of deficits Awareness: Intellectual Problem Solving: Decreased initiation;Slow processing;Difficulty sequencing;Requires verbal cues;Requires tactile cues General Comments: Pt not following many commands today, needs constant redirection to task, perseverating on returning to bed and having therapist scratch his back.  Pertinent Vitals/Pain Pain Assessment: Faces Faces Pain Scale: Hurts little more Pain Location: Left elbow and hand Pain Descriptors / Indicators: Grimacing;Guarding Pain  Intervention(s): Limited activity within patient's tolerance;Monitored during session;Repositioned           PT Goals (current goals can now be found in the care plan section) Acute Rehab PT Goals Patient Stated Goal: [name] wanted his back scractched and he wanted to get back in the bed.  Progress towards PT goals: Not progressing toward goals - comment(decreased frequency)    Frequency    Min 2X/week      PT Plan Frequency needs to be updated       AM-PAC PT "6 Clicks" Daily Activity  Outcome Measure  Difficulty turning over in bed (including adjusting bedclothes, sheets and blankets)?: Unable Difficulty moving from lying on back to sitting on the side of the bed? : Unable Difficulty sitting down on and standing up from a chair with arms (e.g., wheelchair, bedside commode, etc,.)?: Unable Help needed moving to and from a bed to chair (including a wheelchair)?: Total Help needed walking in hospital room?: Total Help needed climbing 3-5 steps with a railing? : Total 6 Click Score: 6    End of Session   Activity Tolerance: Patient limited by fatigue;Patient limited by lethargy Patient left: in bed;with call bell/phone within reach;with bed alarm set Nurse Communication: Mobility status PT Visit Diagnosis: Muscle weakness (generalized) (M62.81);History of falling (Z91.81);Difficulty in walking, not elsewhere classified (R26.2);Other symptoms and signs involving the nervous system (Z61.096)     Time: 0454-0981 PT Time Calculation (min) (ACUTE ONLY): 17 min  Charges:  $Neuromuscular Re-education: 8-22 mins          Gladys Gutman B. Brantly Kalman, PT, DPT [phone]            06/01/2017, 10:35 PM

## 2017-06-01 NOTE — Progress Notes (Signed)
Before receiving report from night shift, Mr. [name] was heard screaming in the hallway that he was having suicidal thoughts and wanted to hurt himself over some issues with his wife and friend. I questioned patient about this and patient reported that "he had it planned that he was going to load his gun and hurt some people or himself". AC and both night and day shift charge nurse notified of this situation. Spoke with office for MD on-call and suicide precautions and sitter order entered via protocol. Waited in patient's room until sitter arrived and room was cleaned and checked with day nurse and tech. Awaiting to hear from MD and will continue to monitor pt closely. Jillyn HiddenStone,Jacklyne Baik R, RN

## 2017-06-01 NOTE — Progress Notes (Signed)
Patient ID: Christopher ReichertManuel Shaffer, male   DOB: 24-Sep-1965, 52 y.o.   MRN: [id number] Resting comfortably, no change in exam, awaiting placement

## 2017-06-02 LAB — GLUCOSE, CAPILLARY
GLUCOSE-CAPILLARY: 97 mg/dL (ref 65–99)
GLUCOSE-CAPILLARY: 129 mg/dL — AB (ref 65–99)
GLUCOSE-CAPILLARY: 105 mg/dL — AB (ref 65–99)
GLUCOSE-CAPILLARY: 151 mg/dL — AB (ref 65–99)

## 2017-06-02 NOTE — Progress Notes (Signed)
Patient ID: Christopher ReichertManuel Shaffer, male   DOB: 08-03-1965, 52 y.o.   MRN: [id number] Patient is stable.  Arouses easily.  He is confused.  He is hemiplegic on the left.  He follows commands on the right.  Insurance issues noted.

## 2017-06-02 NOTE — Progress Notes (Addendum)
Occupational Therapy Treatment Patient Details Name: [name] MRN: [id number] DOB: August 28, 1965 Today's Date: 06/02/2017    History of present illness Patient is a 52 yo male, chronic alcoholic admitted after being found face down with evidence of head injury.  CT of head showed acute depressed Rt fronto-temporal skull fx with frontotemporal confluent acute hemorrhagic contusions, small Lt frontal lobe contusion, 6mm Rt > Lt midline shift with Rt uncal herniation, Rt to Lt ventricular entrapments as well ass R-IVH, SAH, and B-SDH.   He was also found ot have questionable of cortical avulsion fracture of dorsal aspect of the Lt olecranon possible at insertion of triceps tendon (Lt UE placed in sling).  Other significant PMH includes ETOH, CKD, HTN, AAA, DM, and h/o prior traumatic SAH    OT comments  Pt progressing towards goals; initially following one step commands though requiring increased multimodal cues and increasingly distracted as session progressed. Pt requiring MaxA+2 for bed mobility; fluctuating levels of assist for static sitting EOB with pt intermittently able to maintain with MinGuard assist, though overall requiring mod-maxA. Use of mirror during this session for increased visual feedback while sitting EOB. Feel SNF recommendation remains appropriate at this time. Will continue to follow acutely to progress pt towards established OT goals.    Follow Up Recommendations  SNF    Equipment Recommendations  Other (comment)(TBD in next venue )          Precautions / Restrictions Precautions Precautions: Fall Precaution Comments: TBI Required Braces or Orthoses: Sling Restrictions Weight Bearing Restrictions: Yes LUE Weight Bearing: Non weight bearing       Mobility Bed Mobility Overal bed mobility: Needs Assistance Bed Mobility: Supine to Sit;Sit to Supine Rolling: Mod assist Sidelying to sit: Max assist;+2 for physical assistance;+2 for safety/equipment   Sit  to supine: +2 for physical assistance;Max assist;Total assist   General bed mobility comments: Two person max assist to transition to EOB and back into bed.  Support at trunk and legs.                         Balance Overall balance assessment: Needs assistance Sitting-balance support: Feet supported;Single extremity supported Sitting balance-Leahy Scale: Poor Sitting balance - Comments: pt briefly maintains static sitting with MinGuard assist, though with increased fatigue requires mod-maxA, requires assist and multimodal cues to maintain upright position as pt tending to lean L  Postural control: Left lateral lean;Posterior lean                                 ADL either performed or assessed with clinical judgement   ADL Overall ADL's : Needs assistance/impaired     Grooming: Wash/dry face;Minimal assistance;Sitting Grooming Details (indicate cue type and reason): pt requiring cues to initiate and attend to task, only minimally completing task as pt becoming distracted and requesting to have his back scratched.              Lower Body Dressing: Total assistance;Bed level Lower Body Dressing Details (indicate cue type and reason): donning socks              Functional mobility during ADLs: Maximal assistance;+2 for physical assistance General ADL Comments: pt with MaxA+2 for bed mobility and to sit EOB, pt initially following one step commands fairly consistently, though as session continues required increased multidmodal cues to complete; use of mirror for visual feedback while sitting EOB  with pt fluctuating between MinGuard (briefly) to maxA as pt with L lateral lean      Vision   Additional Comments: pt continues to require max multimodal cues to scan and attend to L visual field, able to achieve scanning to midline with use of mirror though does not sustain               Cognition Arousal/Alertness: Lethargic Behavior During Therapy: Flat  affect Overall Cognitive Status: Impaired/Different from baseline Area of Impairment: Attention;Memory;Following commands;Safety/judgement;Awareness;Problem solving;Orientation                   Current Attention Level: Focused Memory: Decreased recall of precautions;Decreased short-term memory Following Commands: Follows one step commands inconsistently;Follows one step commands with increased time Safety/Judgement: Decreased awareness of safety;Decreased awareness of deficits Awareness: Intellectual Problem Solving: Decreased initiation;Slow processing;Difficulty sequencing;Requires verbal cues;Requires tactile cues General Comments: pt initially following one step commands, as session progressed pt increasingly distracted/fatigued and requiring multimodal cues to attend to therapist and follow one step commands, following inconsistently                           Pertinent Vitals/ Pain       Pain Assessment: No/denies pain                                                          Frequency  Min 3X/week        Progress Toward Goals  OT Goals(current goals can now be found in the care plan section)  Progress towards OT goals: Progressing toward goals  Acute Rehab OT Goals Patient Stated Goal: to get back into bed to rest  OT Goal Formulation: Patient unable to participate in goal setting Time For Goal Achievement: 06/14/17 Potential to Achieve Goals: Fair  Plan Discharge plan remains appropriate    AM-PAC PT "6 Clicks" Daily Activity     Outcome Measure   Help from another person eating meals?: A Lot Help from another person taking care of personal grooming?: A Lot Help from another person toileting, which includes using toliet, bedpan, or urinal?: Total Help from another person bathing (including washing, rinsing, drying)?: Total Help from another person to put on and taking off regular upper body clothing?: Total Help from  another person to put on and taking off regular lower body clothing?: Total 6 Click Score: 8    End of Session    OT Visit Diagnosis: Unsteadiness on feet (R26.81);Cognitive communication deficit (R41.841) Symptoms and signs involving cognitive functions: (TBI)   Activity Tolerance Patient tolerated treatment well   Patient Left in bed;with call bell/phone within reach;with restraints reapplied;with nursing/sitter in room   Nurse Communication Mobility status        Time: [id number] OT Time Calculation (min): 23 min  Charges: OT General Charges $OT Visit: 1 Visit OT Treatments $Therapeutic Activity: 8-22 mins  Marcy SirenBreanna Abrahm Mancia, OT Pager [phone] 06/02/2017    Orlando PennerBreanna L Janani Chamber 06/02/2017, 11:52 AM

## 2017-06-02 NOTE — Progress Notes (Signed)
  Speech Language Pathology Treatment: Dysphagia  Patient Details Name: Christopher ReichertManuel Shaffer MRN: [id number] DOB: [birth year] Today's Date: 06/02/2017 Time: 1550-1610 SLP Time Calculation (min) (ACUTE ONLY): 20 min  Assessment / Plan / Recommendation Clinical Impression  Pt was seen for skilled ST targeting goals for dysphagia and cognition.  Pt was asleep upon arrival but easily awakened to voice and light touch.  Pt was oriented to place, date, and situation and was more attentive than he has been in previous therapy sessions.  Pt was able to sustain his attention to functional tasks for on average ~30 seconds to 1 minute with mod assist multimodal cues for redirection.  Pt was allowed to consume nectar thick liquids via cup sips without use of chin tuck to assess progression towards liquids advancement.  Pt needed max assist multimodal cues for rate and portion control.  He had no immediate coughing without use of chin tuck; however, he did have multiple swallows which could be indicative of impaired timing of protective mechanism.  Pt's cognition appears to be a significant limiting factor to his diet progression due to extreme variability between distractibility and lethargy.  Pt left in bed with bed alarm set and safety sitter at bedside.  Continue per current plan of care.    HPI HPI: Patient is a 52 yo male, chronic alcoholic with right ICH, small SDH and skull fracture with right to left shift increased from 6 to 8mm per CT 05/14/17. Thrombocytopenia with likely poorly functioning platelets. Left hemiparesis with left neglect and loss of vision on left.      SLP Plan  Continue with current plan of care       Recommendations  Diet recommendations: Dysphagia 1 (puree);Nectar-thick liquid Liquids provided via: Cup Medication Administration: Crushed with puree Supervision: Staff to assist with self feeding;Full supervision/cueing for compensatory strategies Compensations: Chin tuck;Slow  rate Postural Changes and/or Swallow Maneuvers: Seated upright 90 degrees;Upright 30-60 min after meal;Chin tuck                Oral Care Recommendations: Oral care BID Follow up Recommendations: Skilled Nursing facility SLP Visit Diagnosis: Attention and concentration deficit;Frontal lobe and executive function deficit;Cognitive communication deficit (R41.841) Plan: Continue with current plan of care       GO                PageMelanee Spry, Mahealani Sulak L 06/02/2017, 4:17 PM

## 2017-06-03 LAB — GLUCOSE, CAPILLARY
Glucose-Capillary: 122 mg/dL — ABNORMAL HIGH (ref 65–99)
Glucose-Capillary: 74 mg/dL (ref 65–99)
Glucose-Capillary: 103 mg/dL — ABNORMAL HIGH (ref 65–99)
Glucose-Capillary: 109 mg/dL — ABNORMAL HIGH (ref 65–99)

## 2017-06-03 NOTE — Progress Notes (Signed)
Patient ID: Carlyn ReichertManuel Hauk, male   DOB: Oct 24, 1965, 52 y.o.   MRN: [id number] Subjective: Patient reports "I just woke up." no specific complaints today  Objective: Vital signs in last 24 hours: Temp:  [97.8 F (36.6 C)-98.9 F (37.2 C)] 97.9 F (36.6 C) (03/15 0544) Pulse Rate:  [81-109] 101 (03/15 0544) Resp:  [16-19] 16 (03/15 0544) BP: (114-147)/(42-90) 142/90 (03/15 0544) SpO2:  [96 %-100 %] 96 % (03/15 0544)  Intake/Output from previous day: 03/14 0701 - 03/15 0700 In: 505 [P.O.:480] Out: -  Intake/Output this shift: Total I/O In: 265 [P.O.:240; Other:25] Out: -   neuro exam unchanged  Lab Results: Lab Results  Component Value Date   WBC 8.4 05/29/2017   HGB 11.9 (L) 05/29/2017   HCT 36.5 (L) 05/29/2017   MCV 102.2 (H) 05/29/2017   PLT 221 05/29/2017   Lab Results  Component Value Date   INR 1.38 05/15/2017   BMET Lab Results  Component Value Date   NA 132 (L) 05/29/2017   K 3.9 05/29/2017   CL 102 05/29/2017   CO2 23 05/29/2017   GLUCOSE 99 05/29/2017   BUN 6 05/29/2017   CREATININE 0.59 (L) 05/29/2017   CALCIUM 8.3 (L) 05/29/2017    Studies/Results: No results found.  Assessment/Plan: Stable, CCM  Estimated body mass index is 25.09 kg/m as calculated from the following:   Height as of this encounter: 6' (1.829 m).   Weight as of this encounter: 83.9 kg (184 lb 15.5 oz).    LOS: 21 days    JONES,DAVID S 06/03/2017, 6:06 AM

## 2017-06-03 NOTE — Progress Notes (Signed)
Requested with PA a consult with psychiatry for his suicidal ideation. PA to f/u with consult.

## 2017-06-04 LAB — GLUCOSE, CAPILLARY
GLUCOSE-CAPILLARY: 83 mg/dL (ref 65–99)
Glucose-Capillary: 116 mg/dL — ABNORMAL HIGH (ref 65–99)
Glucose-Capillary: 120 mg/dL — ABNORMAL HIGH (ref 65–99)

## 2017-06-04 NOTE — Progress Notes (Signed)
NEUROSURGERY PROGRESS NOTE  Patient resting comfortably. Did consult psych yesterday for suicidal ideations.   Temp:  [97.6 F (36.4 C)-98.9 F (37.2 C)] 97.8 F (36.6 C) (03/16 0438) Pulse Rate:  [98-112] 98 (03/16 0438) Resp:  [16-18] 18 (03/16 0438) BP: (137-146)/(78-88) 144/86 (03/16 0438) SpO2:  [95 %-100 %] 95 % (03/16 0438)  Plan: Awaiting SNF placement. No new nsgy recom.   Sherryl MangesKimberly Hannah Meyran, NP 06/04/2017 8:25 AM

## 2017-06-05 LAB — GLUCOSE, CAPILLARY
GLUCOSE-CAPILLARY: 71 mg/dL (ref 65–99)
Glucose-Capillary: 80 mg/dL (ref 65–99)
Glucose-Capillary: 118 mg/dL — ABNORMAL HIGH (ref 65–99)
Glucose-Capillary: 100 mg/dL — ABNORMAL HIGH (ref 65–99)

## 2017-06-05 NOTE — Progress Notes (Signed)
NEUROSURGERY PROGRESS NOTE  Alert and interactive this morning. Still with some left sided weakness.   Temp:  [98.4 F (36.9 C)] 98.4 F (36.9 C) (03/17 0455) Pulse Rate:  [92-113] 92 (03/17 0455) Resp:  [18] 18 (03/17 0010) BP: (138-148)/(82-93) 138/82 (03/17 0455) SpO2:  [96 %-98 %] 98 % (03/17 0455)  Plan: Did consults psych on Friday, waiting for them to see him. No new nsgy recom.  Sherryl MangesKimberly Hannah Neeko Pharo, NP 06/05/2017 6:37 AM

## 2017-06-06 DIAGNOSIS — F32A Depression, unspecified: Secondary | ICD-10-CM

## 2017-06-06 DIAGNOSIS — F329 Major depressive disorder, single episode, unspecified: Secondary | ICD-10-CM

## 2017-06-06 LAB — GLUCOSE, CAPILLARY
GLUCOSE-CAPILLARY: 116 mg/dL — AB (ref 65–99)
Glucose-Capillary: 83 mg/dL (ref 65–99)
Glucose-Capillary: 155 mg/dL — ABNORMAL HIGH (ref 65–99)
Glucose-Capillary: 90 mg/dL (ref 65–99)

## 2017-06-06 NOTE — Progress Notes (Signed)
PHARMACIST - PHYSICIAN COMMUNICATION  CONCERNING:  Keppra   RECOMMENDATION: Consider stopping Keppra for seizure prophylaxis after surgery if appropriate?   Thank you Okey RegalLisa Tamana Hatfield, PharmD [phone]
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
## 2017-06-06 NOTE — Progress Notes (Signed)
Patient ID: Christopher ReichertManuel Mathwig, male   DOB: 1965/07/04, 52 y.o.   MRN: [id number]   New Psychiatric consult request entered in Epic after multiple attempts to reach Dr. Sharma CovertNorman at the Care Team number listed.  Dr. Sharma CovertNorman has seen the patient previously during this hospital stay. New request is due to suicidal ideations last week and depression this morning.
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
## 2017-06-06 NOTE — Progress Notes (Signed)
Physical Therapy Treatment Patient Details Name: [name] MRN: [id number] DOB: 1965/05/14 Today's Date: 06/06/2017    History of Present Illness Patient is a 52 yo male, chronic alcoholic admitted after being found face down with evidence of head injury.  CT of head showed acute depressed Rt fronto-temporal skull fx with frontotemporal confluent acute hemorrhagic contusions, small Lt frontal lobe contusion, 77m Rt > Lt midline shift with Rt uncal herniation, Rt to Lt ventricular entrapments as well ass R-IVH, SAH, and B-SDH. He was also found ot have questionable of cortical avulsion fracture of dorsal aspect of the Lt olecranon however ruled out via imaging and no longer has weight bearing restrictions.  Other significant PMH includes ETOH, CKD, HTN, AAA, DM, and h/o prior traumatic SAH     PT Comments    Pt progressing slowly towards physical therapy goals. Sitting balance improving as L lateral lean is less intense this session. Pt perseverating on having a bowel movement this session so mobility was limited. Feel pt is ready to start advancing towards standing activity. Will continue to follow and progress as able per POC.   Follow Up Recommendations  SNF;Supervision/Assistance - 24 hour     Equipment Recommendations  Wheelchair cushion (measurements PT);Wheelchair (measurements PT);Hospital bed    Recommendations for Other Services Rehab consult     Precautions / Restrictions Precautions Precautions: Fall Precaution Comments: TBI Restrictions Other Position/Activity Restrictions: Discussed with RN. No fx on imaging. Used conservative protocal and avoiding pushing/pulling with LUE    Mobility  Bed Mobility Overal bed mobility: Needs Assistance Bed Mobility: Rolling;Sidelying to Sit Rolling: Max assist;+2 for safety/equipment Sidelying to sit: Max assist;+2 for physical assistance   Sit to supine: Max assist;+2 for physical assistance   General bed mobility  comments: Max A to roll side to side for toileting on bed pan. Pull with right arm to assist during roll to the left. Once sitting EOB, pt pushing to the L but was able to improve sitting balance/posture after bracing down on RUE and coming back up to midline. Pt able to take a washcloth from the right and wipe his face. Increased assist required for truncal support during ADL.   Transfers                 General transfer comment: Unable to attempt this session  Ambulation/Gait                 Stairs            Wheelchair Mobility    Modified Rankin (Stroke Patients Only) Modified Rankin (Stroke Patients Only) Pre-Morbid Rankin Score: No symptoms Modified Rankin: Severe disability     Balance Overall balance assessment: Needs assistance Sitting-balance support: Feet supported;Single extremity supported Sitting balance-Leahy Scale: Poor Sitting balance - Comments: pt briefly maintains static sitting with MinGuard assist, though with increased fatigue requires mod-maxA, requires assist and multimodal cues to maintain upright position as pt tending to lean L  Postural control: Left lateral lean;Posterior lean                                  Cognition Arousal/Alertness: Lethargic Behavior During Therapy: Flat affect Overall Cognitive Status: Impaired/Different from baseline Area of Impairment: Attention;Memory;Following commands;Safety/judgement;Awareness;Problem solving;Orientation               Rancho Levels of Cognitive Functioning Rancho Los Amigos Scales of Cognitive Functioning: Confused/appropriate Orientation Level: Disoriented to;Time Current Attention  Level: Focused Memory: Decreased recall of precautions;Decreased short-term memory Following Commands: Follows one step commands inconsistently;Follows one step commands with increased time Safety/Judgement: Decreased awareness of safety;Decreased awareness of deficits Awareness:  Intellectual Problem Solving: Decreased initiation;Slow processing;Difficulty sequencing;Requires verbal cues;Requires tactile cues General Comments: Perseverating on having a bowel movement this session. Slow processing with delay noted.       Exercises      General Comments        Pertinent Vitals/Pain Pain Assessment: No/denies pain Pain Intervention(s): Monitored during session    Home Living                      Prior Function            PT Goals (current goals can now be found in the care plan section) Acute Rehab PT Goals Patient Stated Goal: To poop PT Goal Formulation: With patient Time For Goal Achievement: 06/20/17 Potential to Achieve Goals: Fair Progress towards PT goals: Goals met and updated - see care plan    Frequency    Min 2X/week      PT Plan Frequency needs to be updated    Co-evaluation PT/OT/SLP Co-Evaluation/Treatment: Yes Reason for Co-Treatment: Complexity of the patient's impairments (multi-system involvement);To address functional/ADL transfers;For patient/therapist safety;Necessary to address cognition/behavior during functional activity PT goals addressed during session: Mobility/safety with mobility;Balance        AM-PAC PT "6 Clicks" Daily Activity  Outcome Measure  Difficulty turning over in bed (including adjusting bedclothes, sheets and blankets)?: Unable Difficulty moving from lying on back to sitting on the side of the bed? : Unable Difficulty sitting down on and standing up from a chair with arms (e.g., wheelchair, bedside commode, etc,.)?: Unable Help needed moving to and from a bed to chair (including a wheelchair)?: Total Help needed walking in hospital room?: Total Help needed climbing 3-5 steps with a railing? : Total 6 Click Score: 6    End of Session   Activity Tolerance: Patient limited by fatigue Patient left: in bed;with call bell/phone within reach;with bed alarm set Nurse Communication: Mobility  status PT Visit Diagnosis: Muscle weakness (generalized) (M62.81);History of falling (Z91.81);Difficulty in walking, not elsewhere classified (R26.2);Other symptoms and signs involving the nervous system (R29.898)     Time: 1127-1207 PT Time Calculation (min) (ACUTE ONLY): 40 min  Charges:  $Therapeutic Activity: 23-37 mins                    G Codes:       Rolinda Roan, PT, DPT Acute Rehabilitation Services Pager: [phone]    Thelma Comp 06/06/2017, 2:40 PM

## 2017-06-06 NOTE — Progress Notes (Signed)
SLP Cancellation Note  Patient Details Name: [name] MRN: [id number] DOB: 07-07-1965   Cancelled treatment:       Reason Eval/Treat Not Completed: Patient at procedure or test/unavailable   Tressie StalkerPat Dow Blahnik, M.S., CCC-SLP 06/06/2017, 4:44 PM

## 2017-06-06 NOTE — Progress Notes (Signed)
Subjective: Patient reports "depression.  Things aren't going well at work.  Things aren't going well outside of work."  Objective: Vital signs in last 24 hours: Temp:  [97.7 F (36.5 C)-98.6 F (37 C)] 97.7 F (36.5 C) (03/18 0401) Pulse Rate:  [90-106] 106 (03/18 0401) Resp:  [16-20] 18 (03/18 0401) BP: (121-151)/(74-99) 140/94 (03/18 0401) SpO2:  [97 %-99 %] 97 % (03/18 0401)  Intake/Output from previous day: 03/17 0701 - 03/18 0700 In: 180 [P.O.:180] Out: 500 [Urine:500] Intake/Output this shift: No intake/output data recorded.  Physical Exam: Stable.  Patient awake and alert, hemiplegic on left.  Following commands on right.   Lab Results: No results for input(s): WBC, HGB, HCT, PLT in the last 72 hours. BMET No results for input(s): NA, K, CL, CO2, GLUCOSE, BUN, CREATININE, CALCIUM in the last 72 hours.  Studies/Results: No results found.  Assessment/Plan: Patient has sitter.  He has expressed suicidal ideation.  He is awaiting placement.  We appreciate psychiatric input.  No acute neurosurgical issues at this time.    LOS: 24 days    Dorian HeckleSTERN,Angelo Prindle D, MD 06/06/2017, 7:43 AM

## 2017-06-07 LAB — GLUCOSE, CAPILLARY
GLUCOSE-CAPILLARY: 80 mg/dL (ref 65–99)
Glucose-Capillary: 88 mg/dL (ref 65–99)
Glucose-Capillary: 130 mg/dL — ABNORMAL HIGH (ref 65–99)
Glucose-Capillary: 146 mg/dL — ABNORMAL HIGH (ref 65–99)

## 2017-06-07 NOTE — Progress Notes (Signed)
CSW following for discharge plan. CSW contacted patient's wife by phone to discuss progress with applying for disability for the patient. Patient's wife went to social security office, and was told that she needs to come back with information on his medical condition as well as information on his bank accounts. Patient's wife is going to get his bank records, but will need assistance with his medical records. CSW to compile information on patient's current medical status for patient's wife to provide to social security for patient's disability application. Patient's wife will be able to come tomorrow to pick it up and take it to the social security office.  CSW will continue to follow.  Christopher NicelyElizabeth Keiley Shaffer, [place] Clinical Social Worker [phone]

## 2017-06-07 NOTE — Progress Notes (Signed)
  Speech Language Pathology Treatment: Dysphagia;Cognitive-Linquistic  Patient Details Name: Christopher ReichertManuel Shaffer MRN: [id number] DOB: [birth year] Today's Date: 06/07/2017 Time: [id number] SLP Time Calculation (min) (ACUTE ONLY): 24 min  Assessment / Plan / Recommendation Clinical Impression  Pt was seen for skilled ST targeting goals for cognition and dysphagia.  Pt was sleeping upon arrival.  He awakened easily to voice, light touch, and cold compress but remained lethargic and kept his eyes closed throughout therapy session despite answering questions appropriately. Oral care was provided with pt demonstrating poor oral hygiene and bleeding at the gums.   SLP facilitated the session with trials of thin liquids following oral care to continue working towards diet progression.  Pt needed mod-max assist multimodal cues for rate and portion control due to impulsivity.  Pt had no coughing following trials but did have multiple audible swallows due to large bolus size consumed at fast rate.  Continue to recommend that pt remain on dys 1, nectar thick liquids due to mentation.  Pt needed total assist to reorient to date but could recall 2/3 unrelated words after an ~1 minute delay, improving to 3/3 recall with min assist verbal cues.  Pt needed max assist multimodal cues for redirection to task due to being internally distracted.  Pt left in bed with sitter at bedside and bed alarm set.    HPI HPI: Patient is a 52 yo male, chronic alcoholic with right ICH, small SDH and skull fracture with right to left shift increased from 6 to 8mm per CT 05/14/17. Thrombocytopenia with likely poorly functioning platelets. Left hemiparesis with left neglect and loss of vision on left.      SLP Plan  Continue with current plan of care       Recommendations  Diet recommendations: Dysphagia 1 (puree);Nectar-thick liquid Liquids provided via: Cup Medication Administration: Crushed with puree Supervision: Staff to assist  with self feeding;Full supervision/cueing for compensatory strategies Compensations: Chin tuck;Slow rate Postural Changes and/or Swallow Maneuvers: Seated upright 90 degrees;Upright 30-60 min after meal;Chin tuck                Oral Care Recommendations: Oral care BID Follow up Recommendations: Skilled Nursing facility SLP Visit Diagnosis: Attention and concentration deficit;Frontal lobe and executive function deficit;Cognitive communication deficit (R41.841) Plan: Continue with current plan of care       GO                PageMelanee Spry, Aishwarya Shiplett L 06/07/2017, 3:57 PM

## 2017-06-07 NOTE — Progress Notes (Addendum)
Subjective: Patient reports "I'm awake." "Oww"  Objective: Vital signs in last 24 hours: Temp:  [97.9 F (36.6 C)-98.4 F (36.9 C)] 98.4 F (36.9 C) (03/19 0515) Pulse Rate:  [85-114] 101 (03/19 0515) Resp:  [18-20] 18 (03/19 0515) BP: (122-170)/(83-98) 170/98 (03/19 0515) SpO2:  [90 %-99 %] 96 % (03/19 0515)  Intake/Output from previous day: 03/18 0701 - 03/19 0700 In: 950 [P.O.:950] Out: -  Intake/Output this shift: No intake/output data recorded.  Somnolent, arouses to touch but doesn't awaken to participate in conversation.Poor sleep lately, will expect more participation later in the day.  Moves right side. Sitter reports no issues overnight.   Lab Results: No results for input(s): WBC, HGB, HCT, PLT in the last 72 hours. BMET No results for input(s): NA, K, CL, CO2, GLUCOSE, BUN, CREATININE, CALCIUM in the last 72 hours.  Studies/Results: No results found.  Assessment/Plan:   LOS: 25 days  Appreciate psychiatry's assistance. Plan for SNF, pending insurance.    [name], [name] 06/07/2017, 7:20 AM   I am unsure as to whether patient still requires a sitter, given fluctuating expression of SI and hemiplegia with severely restricted mobility.

## 2017-06-08 LAB — GLUCOSE, CAPILLARY
GLUCOSE-CAPILLARY: 117 mg/dL — AB (ref 65–99)
GLUCOSE-CAPILLARY: 121 mg/dL — AB (ref 65–99)
Glucose-Capillary: 72 mg/dL (ref 65–99)
Glucose-Capillary: 94 mg/dL (ref 65–99)

## 2017-06-08 NOTE — Progress Notes (Signed)
Nutrition Follow-up  DOCUMENTATION CODES:   Not applicable  INTERVENTION:  Continue Ensure Enlive po TID, each supplement provides 350 kcal and 20 grams of protein  Recommend obtain new weight  NUTRITION DIAGNOSIS:   Inadequate oral intake related to dysphagia, lethargy/confusion as evidenced by meal completion < 50%. -progressing  GOAL:   Patient will meet greater than or equal to 90% of their needs -met  MONITOR:   PO intake, Supplement acceptance, Diet advancement, Weight trends  ASSESSMENT:   Pt with PMH of ETOH abuse, previous TBI, AAA, CKD, HTN, with frequent visit to ER admitted 2/22 (found down with head injury) with R ICH, small SDH, skull fx, L hemiparesis, L neglect and loss of vision on the left.   Spoke with patient's RN and NT. Ate 100% this morning for breakfast. Awaiting placement PO avg 46%  Labs reviewed:  Na 132  Medications reviewed and include:  Folic Acid, Novolog, Thiamine, Senokot-S  Diet Order:  Fall precautions DIET - DYS 1 Room service appropriate? Yes; Fluid consistency: Nectar Thick Suicide precautions  EDUCATION NEEDS:   No education needs have been identified at this time  Skin:  Skin Assessment: Reviewed RN Assessment Skin Integrity Issues:: Other (Comment) Other: non pressure wound to back  Last BM:  06/07/2017  Height:   Ht Readings from Last 1 Encounters:  05/13/17 6' (1.829 m)    Weight:   Wt Readings from Last 1 Encounters:  06/02/17 184 lb 15.5 oz (83.9 kg)    Ideal Body Weight:  80.9 kg  BMI:  Body mass index is 25.09 kg/m.  Estimated Nutritional Needs:   Kcal:  2100-2300  Protein:  125-140 grams  Fluid:  >2.1 L/day  Satira Anis. Indi Willhite, MS, RD LDN Inpatient Clinical Dietitian Pager [phone]

## 2017-06-08 NOTE — Progress Notes (Signed)
CSW following for discharge needs. CSW spoke with CSW Director yesterday afternoon about obtaining medical records for the patient; unable to do so, as patient's wife would need to request records herself per hospital policy or would need to provide specific information via a release of information request from Social Security office in order for patient's medical records to be released.   CSW spoke with patient's wife over the phone to provide information to her. CSW obtained update from patient's wife that she went to the bank to get his account information and they will not give it to her because he had her name taken off of his accounts when they separated. Patient's wife said that the bank told her that she would need power of attorney or to get consent from him, but the patient is unable to give consent at this time due to mental status.   Patient's wife will need bank account information in order to apply for Medicaid for the patient for LOG placement. CSW will continue to follow.  Blenda NicelyElizabeth Usbaldo Pannone, [place] Clinical Social Worker [phone]

## 2017-06-08 NOTE — Progress Notes (Signed)
Subjective: Patient reports "I'm OK."  Objective: Vital signs in last 24 hours: Temp:  [97.9 F (36.6 C)-98.6 F (37 C)] 98.6 F (37 C) (03/20 0605) Pulse Rate:  [93-111] 108 (03/20 0605) Resp:  [16-20] 18 (03/20 0605) BP: (130-158)/(82-90) 148/90 (03/20 0605) SpO2:  [97 %-100 %] 100 % (03/20 0605)  Intake/Output from previous day: 03/19 0701 - 03/20 0700 In: 1330 [P.O.:1330] Out: 801 [Urine:801] Intake/Output this shift: No intake/output data recorded.  Physical Exam: Exam stable.  Patient awake and answers questions. Motor exam stable.  Lab Results: No results for input(s): WBC, HGB, HCT, PLT in the last 72 hours. BMET No results for input(s): NA, K, CL, CO2, GLUCOSE, BUN, CREATININE, CALCIUM in the last 72 hours.  Studies/Results: No results found.  Assessment/Plan: Continuing to work with therapies.  Awaiting placement.    LOS: 26 days    Vandell Kun D, MD 06/08/2017, 7:50 AM

## 2017-06-08 NOTE — Progress Notes (Signed)
Occupational Therapy Treatment Patient Details Name: [name] MRN: [id number] DOB: 10/23/1965 Today's Date: 06/08/2017    History of present illness Patient is a 52 yo male, chronic alcoholic admitted after being found face down with evidence of head injury.  CT of head showed acute depressed Rt fronto-temporal skull fx with frontotemporal confluent acute hemorrhagic contusions, small Lt frontal lobe contusion, 6mm Rt > Lt midline shift with Rt uncal herniation, Rt to Lt ventricular entrapments as well ass R-IVH, SAH, and B-SDH.   He was also found to have questionable of cortical avulsion fracture of dorsal aspect of the Lt olecranon possible at insertion of triceps tendon.  Other significant PMH includes ETOH, CKD, HTN, AAA, DM, and h/o prior traumatic SAH    OT comments  Pt demonstrating progress toward OT goals this session. Initially lethargic with decreased ability to respond to therapist and interact. However, once seated upright at EOB, able to engage and was perseverating on asking "can you help me?" When asked what he needed help with, pt would repeat words from previous conversation. When seated at EOB, noted diminished pushing behaviors and improving seated balance. He was able to request to get OOB to chair and was able to complete transfer with total assist +2. Noted significant B LE adduction impacting his ability to complete bed mobility without significant assistance. Once seated in chair, pt much more alert and interactive thanking therapist for helping him and reporting "I feel good." D/C recommendation remains appropriate.    Follow Up Recommendations  SNF    Equipment Recommendations  Other (comment)(defer to next venue of care)    Recommendations for Other Services      Precautions / Restrictions Precautions Precautions: Fall Precaution Comments: TBI Required Braces or Orthoses: Sling Restrictions Weight Bearing Restrictions: Yes LUE Weight Bearing: Non weight  bearing Other Position/Activity Restrictions: Discussed with RN. No fx on imaging. Used conservative protocal and avoiding pushing/pulling with LUE       Mobility Bed Mobility Overal bed mobility: Needs Assistance Bed Mobility: Rolling;Sidelying to Sit;Sit to Sidelying Rolling: Max assist;+2 for safety/equipment Sidelying to sit: Max assist;+2 for physical assistance     Sit to sidelying: Max assist;+2 for physical assistance General bed mobility comments: Completed bed mobility to both sides in preparation for seated EOB ADL, return to supine, and sitting on other side of bed in preparation for simulated ADL transfer.  Transfers Overall transfer level: Needs assistance Equipment used: 2 person hand held assist Transfers: Squat Pivot Transfers Sit to Stand: Total assist;+2 physical assistance         General transfer comment: Total assist of 2 to transfer from bed to chair this session.     Balance Overall balance assessment: Needs assistance Sitting-balance support: Feet supported;Single extremity supported Sitting balance-Leahy Scale: Poor Sitting balance - Comments: Brief moments of min guard assist at EOB but limited by L lateral lean. No pushing noted. Able to complete lateral leaning with assistance to control core.    Standing balance support: During functional activity Standing balance-Leahy Scale: Zero                             ADL either performed or assessed with clinical judgement   ADL Overall ADL's : Needs assistance/impaired     Grooming: Wash/dry face;Maximal assistance Grooming Details (indicate cue type and reason): Hand over hand assist at EOB  Toilet Transfer: Squat-pivot;Total assistance Toilet Transfer Details (indicate cue type and reason): Pt not pushing this session and able to tolerate assistance from bed to chair.          Functional mobility during ADLs: Total assistance;+2 for physical  assistance(squat-pivot) General ADL Comments: Pt perseverative this session and repeating what therapist stated. He was able to sit at EOB without pushing this session but continues to require significant assistance to maintain upright. Much more alert once up in chair.      Vision   Vision Assessment?: Vision impaired- to be further tested in functional context Additional Comments: Keeping L eye closed at beginning of session and not tracking therapist. However, once up in chair, able to track and maintain B eyes open. No nystagmus noted this session.    Perception     Praxis      Cognition Arousal/Alertness: Lethargic Behavior During Therapy: Flat affect Overall Cognitive Status: Impaired/Different from baseline Area of Impairment: Attention;Memory;Following commands;Safety/judgement;Awareness;Problem solving;Orientation               Rancho Levels of Cognitive Functioning Rancho Los Amigos Scales of Cognitive Functioning: Confused/appropriate(lethargic today though impacting participation) Orientation Level: Disoriented to;Situation;Time;Place Current Attention Level: Focused Memory: Decreased recall of precautions;Decreased short-term memory Following Commands: Follows one step commands inconsistently;Follows one step commands with increased time Safety/Judgement: Decreased awareness of safety;Decreased awareness of deficits Awareness: Intellectual Problem Solving: Decreased initiation;Slow processing;Difficulty sequencing;Requires verbal cues;Requires tactile cues General Comments: Pt very lethargic this session but improved once up in chair. Pt repeating what therapist says and perseverating on "please help me" this session. Pt able to follow commands inconsistently.         Exercises Other Exercises Other Exercises: Lateral leaning on R elbow for core and UE strengthening.  Other Exercises: Utilized slow, slight lateral leans in preparation for improved balance at EOB.     Shoulder Instructions       General Comments Noted tight adduction of B LE impacting bed mobility this session.     Pertinent Vitals/ Pain       Pain Assessment: Faces Faces Pain Scale: No hurt  Home Living                                          Prior Functioning/Environment              Frequency  Min 3X/week        Progress Toward Goals  OT Goals(current goals can now be found in the care plan section)  Progress towards OT goals: Progressing toward goals  Acute Rehab OT Goals Patient Stated Goal: To get out of bed OT Goal Formulation: Patient unable to participate in goal setting Time For Goal Achievement: 06/14/17 Potential to Achieve Goals: Fair  Plan Discharge plan remains appropriate    Co-evaluation                 AM-PAC PT "6 Clicks" Daily Activity     Outcome Measure   Help from another person eating meals?: A Lot Help from another person taking care of personal grooming?: A Lot Help from another person toileting, which includes using toliet, bedpan, or urinal?: Total Help from another person bathing (including washing, rinsing, drying)?: Total Help from another person to put on and taking off regular upper body clothing?: Total Help from another person to put on and taking off regular lower  body clothing?: Total 6 Click Score: 8    End of Session    OT Visit Diagnosis: Unsteadiness on feet (R26.81);Cognitive communication deficit (R41.841) Symptoms and signs involving cognitive functions: (TBI)   Activity Tolerance Patient tolerated treatment well;Patient limited by fatigue   Patient Left in bed;with call bell/phone within reach;with nursing/sitter in room   Nurse Communication Mobility status        Time: 4098-1191 OT Time Calculation (min): 21 min  Charges: OT Treatments $Self Care/Home Management : 8-22 mins  Doristine Section, MS OTR/L  Pager: [phone]    Janesa Dockery A Coleta Grosshans 06/08/2017, 2:57  PM

## 2017-06-09 LAB — GLUCOSE, CAPILLARY
GLUCOSE-CAPILLARY: 145 mg/dL — AB (ref 65–99)
GLUCOSE-CAPILLARY: 134 mg/dL — AB (ref 65–99)
Glucose-Capillary: 86 mg/dL (ref 65–99)
Glucose-Capillary: 88 mg/dL (ref 65–99)

## 2017-06-09 MED ORDER — MIRTAZAPINE 15 MG PO TABS
15.0000 mg | ORAL_TABLET | Freq: Every day | ORAL | Status: DC
Start: 2017-06-09 — End: 2017-07-27
  Administered 2017-06-09 – 2017-07-25 (×47): 15 mg via ORAL
  Filled 2017-06-09 (×47): qty 1

## 2017-06-09 NOTE — Progress Notes (Signed)
CSW received call from [place] [employer] [phone]) to discuss patient's situation. Per Rosalita ChessmanSuzanne, patient has active [employer]BS coverage at this time. Rosalita ChessmanSuzanne discussed that it is still showing active, so the patient's employer has not terminated his coverage at this time. Rosalita ChessmanSuzanne discussed how the patient could be bridged short term to SNF with a rehab authorization for a brief period of time, but would obviously not be covered for very long because he will end up being custodial. CSW discussed case with Rosalita ChessmanSuzanne, and will keep her in the loop of progress with placement.  CSW will continue to follow.  Blenda NicelyElizabeth Terissa Haffey, [place] Clinical Social Worker [phone]

## 2017-06-09 NOTE — Progress Notes (Signed)
CSW presented patient at [facility] this morning to discuss patient's situation and that patient does have active insurance coverage [place] to [place]. [facility] will need [facility] one [facility] the [facility] facilities, as patient will still not qualify for Medicaid at this time. Per Medical Director, CSW faxed referral to [facility] and [facility]. CSW AD will follow up on placement for the patient.  CSW will continue to follow.  Blenda NicelyElizabeth Stepheny Canal, [place] Clinical Social Worker [phone]

## 2017-06-09 NOTE — Progress Notes (Addendum)
Subjective: [facility]  Objective: Vital signs in last 24 hours: Temp:  [97.7 F (36.5 C)-98.6 F (37 C)] 98 F (36.7 C) (03/21 0808) Pulse Rate:  [92-104] 102 (03/21 0808) Resp:  [18-19] 18 (03/21 0808) BP: (123-156)/(72-92) 156/92 (03/21 0808) SpO2:  [96 %-99 %] 98 % (03/21 0808)  Intake/Output from previous day: 03/20 0701 - 03/21 0700 In: 620 [P.O.:620] Out: 700 [Urine:700] Intake/Output this shift: Total I/O In: -  Out: 400 [Urine:400]  awakens to voice. Follows commands with right side. Daytime sitter has been told of no issues by nighttime sitter, no concerning behavior last few days. Alertness improves through the day.   Lab Results: No results for input(s): WBC, HGB, HCT, PLT in the last 72 hours. BMET No results for input(s): NA, K, CL, CO2, GLUCOSE, BUN, CREATININE, CALCIUM in the last 72 hours.  Studies/Results: No results found.  Assessment/Plan:   LOS: 27 days  Per Dr. Venetia MaxonStern d/c telemetry. Awaiting SNF. Ok to d/c sitter from behavioral standpoint (cleared by psychiatry) unless nursing staff is concerned about his behavior. Will need max assist with all ADLs d/t mental status and hemeplegia. Unable to find order for Remeron suggested by psychiatry in note on the 18th - will add on Dr. Fredrich BirksStern's order now.    Georgiann Cockeroteat, Brian 06/09/2017, 9:36 AM   Awaiting SNF placement.

## 2017-06-10 LAB — GLUCOSE, CAPILLARY
Glucose-Capillary: 87 mg/dL (ref 65–99)
Glucose-Capillary: 103 mg/dL — ABNORMAL HIGH (ref 65–99)
Glucose-Capillary: 116 mg/dL — ABNORMAL HIGH (ref 65–99)

## 2017-06-10 LAB — BASIC METABOLIC PANEL
Anion gap: 11 (ref 5–15)
BUN: 5 mg/dL — ABNORMAL LOW (ref 6–20)
CALCIUM: 8.9 mg/dL (ref 8.9–10.3)
CHLORIDE: 98 mmol/L — AB (ref 101–111)
CO2: 23 mmol/L (ref 22–32)
CREATININE: 0.56 mg/dL — AB (ref 0.61–1.24)
GFR calc Af Amer: >60 mL/min (ref >60)
GFR calc non Af Amer: >60 mL/min (ref >60)
Glucose, Bld: 90 mg/dL (ref 65–99)
Potassium: 3.4 mmol/L — ABNORMAL LOW (ref 3.5–5.1)
Sodium: 132 mmol/L — ABNORMAL LOW (ref 135–145)

## 2017-06-10 NOTE — Care Management Note (Signed)
Case Management Note  Patient Details  Name: [name] MRN: [id number] Date of Birth: 06-04-1965  Subjective/Objective:                    Action/Plan: Awaiting SNF placement. CM following.  Expected Discharge Date:  05/20/17               Expected Discharge Plan:  Skilled Nursing Facility  In-House Referral:  Clinical Social Work  Discharge planning Services  CM Consult  Post Acute Care Choice:  NA Choice offered to:  NA  DME Arranged:  N/A DME Agency:  NA  HH Arranged:  NA HH Agency:  NA  Status of Service:  In process, will continue to follow  If discussed at Long Length of Stay Meetings, dates discussed:    Additional Comments:  Christopher BaloKelli F Jasmaine Rochel, RN 06/10/2017, 10:12 AM

## 2017-06-10 NOTE — Progress Notes (Signed)
Physical Therapy Treatment Patient Details Name: Christopher ReichertManuel Shaffer MRN: [id number] DOB: January 17, 1966 Today's Date: 06/10/2017    History of Present Illness Patient is a 52 yo male, chronic alcoholic admitted after being found face down with evidence of head injury.  CT of head showed acute depressed Rt fronto-temporal skull fx with frontotemporal confluent acute hemorrhagic contusions, small Lt frontal lobe contusion, 6mm Rt > Lt midline shift with Rt uncal herniation, Rt to Lt ventricular entrapments as well ass R-IVH, SAH, and B-SDH.   He was also found to have questionable of cortical avulsion fracture of dorsal aspect of the Lt olecranon possible at insertion of triceps tendon.  Other significant PMH includes ETOH, CKD, HTN, AAA, DM, and h/o prior traumatic SAH     PT Comments    Pt was able to stand twice today with two person max assist from bed.  He continues to have significant left lateral lean if he is not holding to something reaching with his right hand to rail/bar.  He was very awake and spoke both [place] and [place] today (I have not heard him speak Spanish yet) as well as reporting that there was a man in the room and talking to the man.  PT will continue to follow acutely and I would like to continue safe attempts at standing if able.   Follow Up Recommendations  SNF;Supervision/Assistance - 24 hour     Equipment Recommendations  Wheelchair cushion (measurements PT);Wheelchair (measurements PT);Hospital bed    Recommendations for Other Services   NA     Precautions / Restrictions Precautions Precautions: Fall Precaution Comments: TBI Required Braces or Orthoses: Sling Restrictions LUE Weight Bearing: Non weight bearing    Mobility  Bed Mobility Overal bed mobility: Needs Assistance Bed Mobility: Supine to Sit;Sit to Supine     Supine to sit: HOB elevated;Max assist;+2 for physical assistance Sit to supine: +2 for physical assistance;Max assist   General bed  mobility comments: Two person max asisst for transitions both to initiate movement to EOB and help move legs and trunk.    Transfers Overall transfer level: Needs assistance Equipment used: None Transfers: Sit to/from Stand Sit to Stand: +2 physical assistance;Max assist         General transfer comment: Two person heavy max assist to stand from bed x 2.  kept (as best I could) pt's right hand on end of bed (baseboard) rail to attempt to help with pushing to the left in standing.  Cues for hip extension and to push through his feet.  Pt attempting some, but most of the upward lift was from both therapists and if he let go of the rail he would shoot to the left.    Ambulation/Gait             General Gait Details: remains unable at this time.           Balance Overall balance assessment: Needs assistance Sitting-balance support: Feet supported;Single extremity supported Sitting balance-Leahy Scale: Zero Sitting balance - Comments: Brief moments of min assist EOB when he has his R hand on the baseboard of the bed.  Up to max assist due to L lateral lean when hand is not engaged.    Standing balance support: Single extremity supported Standing balance-Leahy Scale: Zero Standing balance comment: max assist EOB.                             Cognition Arousal/Alertness: Awake/alert Behavior  During Therapy: Restless;Flat affect;Impulsive Overall Cognitive Status: Impaired/Different from baseline Area of Impairment: Attention;Following commands;Memory;Orientation;Safety/judgement;Awareness;Problem solving;Rancho level               Rancho Levels of Cognitive Functioning Rancho Mirant Scales of Cognitive Functioning: Confused/inappropriate/non-agitated Orientation Level: Disoriented to;Place("the coffee shop is over there" pointing at the wall) Current Attention Level: Sustained(breif periods of sustained. ) Memory: Decreased recall of precautions;Decreased  short-term memory Following Commands: Follows one step commands inconsistently;Follows one step commands with increased time Safety/Judgement: Decreased awareness of safety;Decreased awareness of deficits Awareness: Intellectual Problem Solving: Slow processing;Decreased initiation;Difficulty sequencing;Requires verbal cues;Requires tactile cues General Comments: Multimodal cues needed and repetition to redirect attention to task.  frequent cues to open his eyes during session.  He was talking to someone in the room and was speaking [place] and [place] during our session today.  At times non sensical speech.              Pertinent Vitals/Pain Pain Assessment: Faces Faces Pain Scale: Hurts little more Pain Location: when asked R arm Pain Descriptors / Indicators: Grimacing;Guarding Pain Intervention(s): Limited activity within patient's tolerance;Monitored during session;Repositioned           PT Goals (current goals can now be found in the care plan section) Acute Rehab PT Goals Patient Stated Goal: to go get coffee Progress towards PT goals: Progressing toward goals    Frequency    Min 2X/week      PT Plan Current plan remains appropriate    Co-evaluation PT/OT/SLP Co-Evaluation/Treatment: Yes Reason for Co-Treatment: Complexity of the patient's impairments (multi-system involvement);Necessary to address cognition/behavior during functional activity;For patient/therapist safety;To address functional/ADL transfers PT goals addressed during session: Mobility/safety with mobility;Balance;Strengthening/ROM        AM-PAC PT "6 Clicks" Daily Activity  Outcome Measure  Difficulty turning over in bed (including adjusting bedclothes, sheets and blankets)?: Unable Difficulty moving from lying on back to sitting on the side of the bed? : Unable Difficulty sitting down on and standing up from a chair with arms (e.g., wheelchair, bedside commode, etc,.)?: Unable Help needed  moving to and from a bed to chair (including a wheelchair)?: Total Help needed walking in hospital room?: Total Help needed climbing 3-5 steps with a railing? : Total 6 Click Score: 6    End of Session   Activity Tolerance: Patient limited by fatigue Patient left: in bed;with call bell/phone within reach;with bed alarm set;with nursing/sitter in room;with restraints reapplied Nurse Communication: Mobility status PT Visit Diagnosis: Muscle weakness (generalized) (M62.81);History of falling (Z91.81);Difficulty in walking, not elsewhere classified (R26.2);Other symptoms and signs involving the nervous system (Z61.096)     Time: 0454-0981 PT Time Calculation (min) (ACUTE ONLY): 34 min  Charges:  $Therapeutic Activity: 8-22 mins                    Aviah Sorci B. Tobenna Needs, PT, DPT [phone]   Dimas Aguas 06/10/2017, 2:45 PM

## 2017-06-10 NOTE — Progress Notes (Addendum)
Subjective: Patient reports "I'm awake"  Objective: Vital signs in last 24 hours: Temp:  [98 F (36.7 C)-98.7 F (37.1 C)] 98.4 F (36.9 C) (03/22 0452) Pulse Rate:  [77-117] 117 (03/22 0452) Resp:  [17-20] 18 (03/22 0452) BP: (115-156)/(77-97) 150/97 (03/22 0452) SpO2:  [95 %-99 %] 95 % (03/22 0452)  Intake/Output from previous day: 03/21 0701 - 03/22 0700 In: 360 [P.O.:360] Out: 1200 [Urine:1200] Intake/Output this shift: No intake/output data recorded.  Awake all night, calling out, wanting to leave, having just fallen asleep prior to my arrival per nurse. Incontinent of bowel last night. Arouses to voice and moves right side but sleepy. First dose Remeron last night. Inspection of skin for breakdown reveals abrasion, dry right shin, and abrasions covered with safety pads left wrist (beneath ID bands) - all have been addressed and appropriately documented by nursing. Sitter continues for monitoring and ADL assistance.  Lab Results: No results for input(s): WBC, HGB, HCT, PLT in the last 72 hours. BMET Recent Labs    06/10/17 0339  NA 132*  K 3.4*  CL 98*  CO2 23  GLUCOSE 90  BUN 5*  CREATININE 0.56*  CALCIUM 8.9    Studies/Results: No results found.  Assessment/Plan: Stable  LOS: 28 days  SNF pending insurance and bed availability. Will monitor his response to Remeron.    Georgiann Cockeroteat, Brian 06/10/2017, 7:30 AM   Awaiting placement.

## 2017-06-10 NOTE — Progress Notes (Signed)
SLP Cancellation Note  Patient Details Name: Christopher ReichertManuel Shaffer MRN: [id number] DOB: 05-27-1965   Cancelled treatment:       Reason Eval/Treat Not Completed: Fatigue/lethargy limiting ability to participate- could not be aroused despite sternal rub, cold washcloth to face.  SLP will follow.    Blenda MountsCouture, Fredderick Swanger Laurice 06/10/2017, 4:27 PM

## 2017-06-11 LAB — GLUCOSE, CAPILLARY
GLUCOSE-CAPILLARY: 81 mg/dL (ref 65–99)
Glucose-Capillary: 131 mg/dL — ABNORMAL HIGH (ref 65–99)
Glucose-Capillary: 129 mg/dL — ABNORMAL HIGH (ref 65–99)
Glucose-Capillary: 93 mg/dL (ref 65–99)

## 2017-06-11 NOTE — Progress Notes (Signed)
Subjective: Patient reports currently resting  Objective: Vital signs in last 24 hours: Temp:  [97.5 F (36.4 C)-99.1 F (37.3 C)] 98.6 F (37 C) (03/23 0448) Pulse Rate:  [101-116] 112 (03/23 0448) Resp:  [18-20] 18 (03/23 0448) BP: (135-169)/(79-99) 158/99 (03/23 0448) SpO2:  [96 %-99 %] 96 % (03/23 0448)  Intake/Output from previous day: 03/22 0701 - 03/23 0700 In: 120 [P.O.:120] Out: 652 [Urine:651; Stool:1] Intake/Output this shift: Total I/O In: -  Out: 2 [Urine:1; Stool:1]  Physical Exam: Patient awake through the night, intermittently agitated.  Lab Results: No results for input(s): WBC, HGB, HCT, PLT in the last 72 hours. BMET Recent Labs    06/10/17 0339  NA 132*  K 3.4*  CL 98*  CO2 23  GLUCOSE 90  BUN 5*  CREATININE 0.56*  CALCIUM 8.9    Studies/Results: No results found.  Assessment/Plan: Patient is stable.  No new recs.  Awaiting placement.    LOS: 29 days    Dorian HeckleSTERN,Brandi Armato D, MD 06/11/2017, 6:49 AM

## 2017-06-12 LAB — GLUCOSE, CAPILLARY
GLUCOSE-CAPILLARY: 83 mg/dL (ref 65–99)
GLUCOSE-CAPILLARY: 79 mg/dL (ref 65–99)
GLUCOSE-CAPILLARY: 109 mg/dL — AB (ref 65–99)
Glucose-Capillary: 132 mg/dL — ABNORMAL HIGH (ref 65–99)

## 2017-06-12 NOTE — Plan of Care (Signed)
POC discussed w/pt, reinforcement needed.

## 2017-06-12 NOTE — Progress Notes (Signed)
Patient ID: Carlyn ReichertManuel Hindle, male   DOB: [birth year], 52 y.o.   MRN: [id number] BP 135/83 (BP Location: Right Arm)   Pulse (!) 103   Temp 98.3 F (36.8 C) (Oral)   Resp 18   Ht 6' (1.829 m)   Wt 83.9 kg (184 lb 15.5 oz)   SpO2 99%   BMI 25.09 kg/m  Lethargic, arouses to voice Moves extremities Exam is stable Not following all commands

## 2017-06-13 LAB — GLUCOSE, CAPILLARY
GLUCOSE-CAPILLARY: 88 mg/dL (ref 65–99)
GLUCOSE-CAPILLARY: 157 mg/dL — AB (ref 65–99)
GLUCOSE-CAPILLARY: 137 mg/dL — AB (ref 65–99)
Glucose-Capillary: 104 mg/dL — ABNORMAL HIGH (ref 65–99)
Glucose-Capillary: 88 mg/dL (ref 65–99)
Glucose-Capillary: 112 mg/dL — ABNORMAL HIGH (ref 65–99)

## 2017-06-13 MED ORDER — LEVETIRACETAM 100 MG/ML PO SOLN
500.0000 mg | Freq: Two times a day (BID) | ORAL | Status: DC
  Administered 2017-06-13 – 2017-06-16 (×7): 500 mg via ORAL
  Filled 2017-06-13 (×8): qty 5

## 2017-06-13 NOTE — Progress Notes (Addendum)
Subjective: Patient reports "Oww"  Objective: Vital signs in last 24 hours: Temp:  [97.6 F (36.4 C)-98.7 F (37.1 C)] 98.7 F (37.1 C) (03/25 0423) Pulse Rate:  [97-107] 102 (03/25 0423) Resp:  [16-18] 18 (03/25 0423) BP: (123-150)/(72-93) 150/93 (03/25 0423) SpO2:  [96 %-100 %] 98 % (03/25 0423)  Intake/Output from previous day: 03/24 0701 - 03/25 0700 In: 600 [P.O.:600] Out: 300 [Urine:300] Intake/Output this shift: No intake/output data recorded.  Sleepy this am after being awake much of the night, Responded favorably to Ativan after episode of thrashing about. Moves right side this am, without change.   Lab Results: No results for input(s): WBC, HGB, HCT, PLT in the last 72 hours. BMET No results for input(s): NA, K, CL, CO2, GLUCOSE, BUN, CREATININE, CALCIUM in the last 72 hours.  Studies/Results: No results found.  Assessment/Plan:   LOS: 31 days  Awaits SNF   Georgiann Cockeroteat, Brian 06/13/2017, 7:27 AM  Patient is stable. We are awaiting placement.

## 2017-06-13 NOTE — Progress Notes (Signed)
Pt yelling out.  Pt stating "I need help"  When asked what help he needed by RN pt stated "I want to kill people, but I want to kill myself so I don't kill other people."  Pt noted to be swinging R arm and pt states "I'm going to start kicking."

## 2017-06-13 NOTE — Care Management Note (Signed)
Case Management Note  Patient Details  Name: Carlyn ReichertManuel Cunnington MRN: [id number] Date of Birth: 10-16-1965  Subjective/Objective:                    Action/Plan: Pt is difficult to place. CSW and CM following.   Expected Discharge Date:  05/20/17               Expected Discharge Plan:  Skilled Nursing Facility  In-House Referral:  Clinical Social Work  Discharge planning Services  CM Consult  Post Acute Care Choice:  NA Choice offered to:  NA  DME Arranged:  N/A DME Agency:  NA  HH Arranged:  NA HH Agency:  NA  Status of Service:  In process, will continue to follow  If discussed at Long Length of Stay Meetings, dates discussed:    Additional Comments:  [name] F Zamora Colton, RN 06/13/2017, 1:11 PM

## 2017-06-13 NOTE — Progress Notes (Signed)
CSW received call from Deb with [employer] that patient's previous employer, [employer], has finally updated the patient's account and he was terminated on 05/03/17 and had no insurance benefit moving forward from that date; his policy termed on 05/03/17 when his employment was terminated. Patient has no insurance coverage at this time, and has not had any coverage for the duration of his hospital stay. Patient would be eligible for COBRA, and that would get backdated to the date of termination if it was obtained.   CSW contacted CSW AD to provide update. [facility] to discuss patient as a difficult to place (DTP) case. CSW AD will follow up on DTP status.  Blenda NicelyElizabeth Angelis Gates, [place] Clinical Social Worker [phone]

## 2017-06-13 NOTE — Progress Notes (Signed)
Nutrition Follow-up  DOCUMENTATION CODES:   Not applicable  INTERVENTION:  Continue Ensure Enlive po TID, each supplement provides 350 kcal and 20 grams of protein  Recommend obtain new weight  NUTRITION DIAGNOSIS:   Inadequate oral intake related to dysphagia, lethargy/confusion as evidenced by meal completion < 50%. -ongoing  GOAL:   Patient will meet greater than or equal to 90% of their needs -met  MONITOR:   PO intake, Supplement acceptance, Diet advancement, Weight trends  ASSESSMENT:   Pt with PMH of ETOH abuse, previous TBI, AAA, CKD, HTN, with frequent visit to ER admitted 2/22 (found down with head injury) with R ICH, small SDH, skull fx, L hemiparesis, L neglect and loss of vision on the left.   Spoke with Economist and NT. He ate 100% of breakfast and lunch today. Sleepy today after being awake overnight per MD.   Labs reviewed:  Na 132, K+ 3.4, CBGs 157   Medications reviewed and include:  Folic acid, Insulin, Remeron, Senokot-S, Thiamine  Disposition: Difficult placement  Diet Order:  Fall precautions DIET - DYS 1 Room service appropriate? Yes; Fluid consistency: Nectar Thick Suicide precautions  EDUCATION NEEDS:   No education needs have been identified at this time  Skin:  Skin Assessment: Reviewed RN Assessment Skin Integrity Issues:: Other (Comment) Other: non pressure wound to back  Last BM:  06/08/2017  Height:   Ht Readings from Last 1 Encounters:  05/13/17 6' (1.829 m)    Weight:   Wt Readings from Last 1 Encounters:  06/02/17 184 lb 15.5 oz (83.9 kg)    Ideal Body Weight:  80.9 kg  BMI:  Body mass index is 25.09 kg/m.  Estimated Nutritional Needs:   Kcal:  2100-2300  Protein:  125-140 grams  Fluid:  >2.1 L/day  Christopher Anis. Raffaele Derise, MS, RD LDN Inpatient Clinical Dietitian Pager [phone]

## 2017-06-14 LAB — GLUCOSE, CAPILLARY
GLUCOSE-CAPILLARY: 87 mg/dL (ref 65–99)
Glucose-Capillary: 116 mg/dL — ABNORMAL HIGH (ref 65–99)
Glucose-Capillary: 102 mg/dL — ABNORMAL HIGH (ref 65–99)
Glucose-Capillary: 113 mg/dL — ABNORMAL HIGH (ref 65–99)

## 2017-06-14 NOTE — Progress Notes (Signed)
OT Treatment Note - late entry    06/10/17 1500  OT Visit Information  Last OT Received On 06/10/17  Assistance Needed +2  PT/OT/SLP Co-Evaluation/Treatment Yes  Reason for Co-Treatment Complexity of the patient's impairments (multi-system involvement);Necessary to address cognition/behavior during functional activity;For patient/therapist safety;To address functional/ADL transfers  OT goals addressed during session ADL's and self-care  History of Present Illness Patient is a 52 yo male, chronic alcoholic admitted after being found face down with evidence of head injury.  CT of head showed acute depressed Rt fronto-temporal skull fx with frontotemporal confluent acute hemorrhagic contusions, small Lt frontal lobe contusion, 6mm Rt > Lt midline shift with Rt uncal herniation, Rt to Lt ventricular entrapments as well ass R-IVH, SAH, and B-SDH.   He was also found to have questionable of cortical avulsion fracture of dorsal aspect of the Lt olecranon possible at insertion of triceps tendon.  Other significant PMH includes ETOH, CKD, HTN, AAA, DM, and h/o prior traumatic SAH   Precautions  Precautions Fall  Precaution Comments TBI  Required Braces or Orthoses Sling (no sling in room; will discuss with nsg)  Pain Assessment  Pain Assessment Faces  Faces Pain Scale 4  Pain Location when asked R arm  Pain Descriptors / Indicators Grimacing;Guarding  Pain Intervention(s) Limited activity within patient's tolerance  Cognition  Arousal/Alertness Awake/alert  Behavior During Therapy Restless;Flat affect;Impulsive  Overall Cognitive Status Impaired/Different from baseline  Area of Impairment Attention;Following commands;Memory;Orientation;Safety/judgement;Awareness;Problem solving;Rancho level  Orientation Level Disoriented to;Place ("the coffee shop is over there" pointing at the wall)  Current Attention Level Sustained (breif periods of sustained. )  Memory Decreased recall of  precautions;Decreased short-term memory  Following Commands Follows one step commands inconsistently;Follows one step commands with increased time  Safety/Judgement Decreased awareness of safety;Decreased awareness of deficits  Awareness Intellectual  Problem Solving Slow processing;Decreased initiation;Difficulty sequencing;Requires verbal cues;Requires tactile cues  General Comments Multimodal cues needed and repetition to redirect attention to task.  frequent cues to open his eyes during session.  He was talking to someone in the room and was speaking [place] and [place] during our session today.  At times non sensical speech.   ADL  Overall ADL's  Needs assistance/impaired  Eating/Feeding Moderate assistance  Eating/Feeding Details (indicate cue type and reason) able to bring cup to his mouth but mod A for cuing and safe swallowing  Grooming Details (indicate cue type and reason) Able to bring washcloth to mouth after multiple commands`  Functional mobility during ADLs Maximal assistance;+2 for physical assistance  General ADL Comments Pt speasking in spainish and also speaking to a "man" in the room  Bed Mobility  Overal bed mobility Needs Assistance  Bed Mobility Supine to Sit;Sit to Supine  Supine to sit HOB elevated;Max assist;+2 for physical assistance  Sit to supine +2 for physical assistance;Max assist  Sit to sidelying Max assist;+2 for physical assistance  General bed mobility comments Two person max asisst for transitions both to initiate movement to EOB and help move legs and trunk.    Balance  Overall balance assessment Needs assistance  Sitting-balance support Feet supported;Single extremity supported  Sitting balance-Leahy Scale Poor  Sitting balance - Comments Brief moments of min assist EOB when he has his R hand on the baseboard of the bed.  Up to max assist due to L lateral lean when hand is not engaged.   Standing balance support Single extremity supported  Standing  balance-Leahy Scale Zero  Standing balance comment max assist EOB.  Restrictions  LUE Weight Bearing NWB  Vision- Assessment  Vision Assessment? Vision impaired- to be further tested in functional context  Rancho Levels of Cognitive Functioning  Rancho [employer] Scales of Cognitive Functioning V  Transfers  Overall transfer level Needs assistance  Equipment used None  Transfers Sit to/from Stand  Sit to Stand +2 physical assistance;Max assist  General transfer comment Two person heavy max assist to stand from bed x 2.  kept (as best I could) pt's right hand on end of bed (baseboard) rail to attempt to help with pushing to the left in standing.  Cues for hip extension and to push through his feet.  Pt attempting some, but most of the upward lift was from both therapists and if he let go of the rail he would shoot to the left.    Other Exercises  Other Exercises lateral leaning to R to weight shift from L to R  Other Exercises L shoulder ROM after reducing tone; increased flexor tone  OT - End of Session  Equipment Utilized During Treatment Gait belt  Activity Tolerance Patient tolerated treatment well  Patient left in bed;with call bell/phone within reach;with bed alarm set;with nursing/sitter in room  Nurse Communication Mobility status  OT Assessment/Plan  OT Plan Discharge plan remains appropriate;Frequency needs to be updated  OT Visit Diagnosis Unsteadiness on feet (R26.81);Cognitive communication deficit (R41.841)  OT Frequency (ACUTE ONLY) Min 2X/week  Follow Up Recommendations SNF  AM-PAC OT "6 Clicks" Daily Activity Outcome Measure  Help from another person eating meals? 2  Help from another person taking care of personal grooming? 2  Help from another person toileting, which includes using toliet, bedpan, or urinal? 1  Help from another person bathing (including washing, rinsing, drying)? 2  Help from another person to put on and taking off regular upper body clothing? 1   Help from another person to put on and taking off regular lower body clothing? 1  6 Click Score 9  ADL G Code Conversion CL  OT Goal Progression  Progress towards OT goals Progressing toward goals  Acute Rehab OT Goals  Patient Stated Goal to go get coffee  OT Goal Formulation Patient unable to participate in goal setting  Time For Goal Achievement 06/14/17  Potential to Achieve Goals Fair  ADL Goals  Pt Will Perform Eating with min assist;sitting  Pt Will Perform Grooming with min assist;sitting  Pt Will Perform Upper Body Bathing with mod assist;sitting  Pt Will Transfer to Toilet with mod assist;with +2 assist;bedside commode  Additional ADL Goal #1 Pt will sustain attention to simple ADL task x 4 mins with min cues  Additional ADL Goal #2 Pt will demonstrates intellectual awareness of deficits  OT Time Calculation  OT Start Time (ACUTE ONLY) 1347  OT Stop Time (ACUTE ONLY) 1421  OT Time Calculation (min) 34 min  OT General Charges  $OT Visit 1 Visit  [facility] Elidia Bonenfant, OT/L  [phone] 06/14/2017

## 2017-06-14 NOTE — Progress Notes (Signed)
Patient ID: Christopher ReichertManuel Boutwell, male   DOB: May 28, 1965, 52 y.o.   MRN: [id number] Awake, responds in conversation. Moves right side to command, with weak left thigh movement this afternoon. Short attention span persists.

## 2017-06-14 NOTE — Progress Notes (Signed)
  Speech Language Pathology Treatment: Dysphagia;Cognitive-Linquistic  Patient Details Name: Christopher ReichertManuel Shaffer MRN: [id number] DOB: 1965-05-20 Today's Date: 06/14/2017 Time: [id number] SLP Time Calculation (min) (ACUTE ONLY): 18 min  Assessment / Plan / Recommendation Clinical Impression  Pt presents with continued limited sustained attention to task of eating or simple conversation during session. Pt required Mod-Max verbal/tactile cues to maintain alertness and maintain conversation topic, with conversation most impacted by repetitive requests, questions of unrelated topics, and language of confusion. Pt required repetition and Mod contextual cues to orient to place/situation. Pt was provided PO trials of current diet, with pt consuming nectar thick liquids without chin tuck. No signs/symptoms concerning for aspiration noted with trials. Overall trials limited by attention deficits and internal distraction. Advanced trials of thin liquids were not attempted given reduced ability to maintain alertness and upright posture. Recommend continued nectar thick and Dys 1 (puree) solids with potential to upgrade diet with increased attention/alertness. SLP will continue to follow.       HPI HPI: Patient is a 52 yo male, chronic alcoholic with right ICH, small SDH and skull fracture with right to left shift increased from 6 to 8mm per CT 05/14/17. Thrombocytopenia with likely poorly functioning platelets. Left hemiparesis with left neglect and loss of vision on left.      SLP Plan  Continue with current plan of care       Recommendations  Diet recommendations: Nectar-thick liquid;Dysphagia 1 (puree) Liquids provided via: Cup Medication Administration: Crushed with puree Supervision: Staff to assist with self feeding;Full supervision/cueing for compensatory strategies Compensations: Slow rate Postural Changes and/or Swallow Maneuvers: Seated upright 90 degrees;Upright 30-60 min after meal                Oral Care Recommendations: Oral care BID Follow up Recommendations: Skilled Nursing facility SLP Visit Diagnosis: Cognitive communication deficit (R41.841);Dysphagia, oropharyngeal phase (R13.12) Plan: Continue with current plan of care       GO              [place] Tarick Parenteau SLP Student Clinician   [place] Grethel Zenk 06/14/2017, 1:01 PM

## 2017-06-14 NOTE — Progress Notes (Signed)
Occupational Therapy Treatment Patient Details Name: [name] MRN: [id number] DOB: 05-30-1965 Today's Date: 06/14/2017    History of present illness Patient is a 52 yo male, chronic alcoholic admitted after being found face down with evidence of head injury.  CT of head showed acute depressed Rt fronto-temporal skull fx with frontotemporal confluent acute hemorrhagic contusions, small Lt frontal lobe contusion, 6mm Rt > Lt midline shift with Rt uncal herniation, Rt to Lt ventricular entrapments as well ass R-IVH, SAH, and B-SDH.   He was also found to have questionable of cortical avulsion fracture of dorsal aspect of the Lt olecranon possible at insertion of triceps tendon.  Other significant PMH includes ETOH, CKD, HTN, AAA, DM, and h/o prior traumatic SAH    OT comments  Pt slowly progressing towards established OT goals. Continue to require increased time and cues throughout session for follow commands; requires simple cues. Pt performing grooming while sitting at EOB with Mod A for left lateral lean. Pt performing sit<>stand with stedy for toilet hygiene requiring Max A +2 to maintain standing and total A for toilet hygiene. Updated pt OT goals and continue to recommend dc to SNF. Will continue to follow acutely to facilitate safe dc.    Follow Up Recommendations  SNF    Equipment Recommendations  Other (comment)(defer to next venue of care)    Recommendations for Other Services Rehab consult    Precautions / Restrictions Precautions Precautions: Fall Precaution Comments: TBI Required Braces or Orthoses: Sling Restrictions Weight Bearing Restrictions: Yes LUE Weight Bearing: Non weight bearing       Mobility Bed Mobility Overal bed mobility: Needs Assistance Bed Mobility: Supine to Sit;Sit to Supine Rolling: Min assist(initiated rolling to the L)   Supine to sit: Max assist Sit to supine: Max assist   General bed mobility comments: pt reaches for assit with R UE,  does not use L UE functionally.   Transfers Overall transfer level: Needs assistance Equipment used: None Transfers: Sit to/from Stand Sit to Stand: Max assist;+2 physical assistance         General transfer comment: completed 1 stand with maxAx2 and 3 stand with stedy. pt with improved posture with stedy but remains to require maxA pt with strong L lateral lean and unable to use L UE to support or assist. Pt only able to tolerate approx 30 sec each time with maxA and directional v/c's    Balance Overall balance assessment: Needs assistance Sitting-balance support: Feet supported;Single extremity supported Sitting balance-Leahy Scale: Poor Sitting balance - Comments: pt able to maintain static sitting balance at EOB with close min guard but requires maxA to maintain balance to complete task ie. brush teeth Postural control: Left lateral lean;Posterior lean Standing balance support: Single extremity supported Standing balance-Leahy Scale: Zero Standing balance comment: dependent on physical assist                           ADL either performed or assessed with clinical judgement   ADL Overall ADL's : Needs assistance/impaired     Grooming: Wash/dry face;Oral care;Moderate assistance;Sitting Grooming Details (indicate cue type and reason): Pt requiring Mod A to maitnaining sitting balance during oral care and washing his face. Providing pt with tooth brush and holding cup with thickened water for pt to brush his teeth.                      Toileting- Clothing Manipulation and Hygiene: Total  assistance;Sit to/from stand Toileting - Clothing Manipulation Details (indicate cue type and reason): Max A +2 for maintaining standing with stedy. Third person needed for toielt hygiene after BM. Pt requiring total for toilet hygiene     Functional mobility during ADLs: Maximal assistance;+2 for physical assistance(sit<>stand only) General ADL Comments: Pt performing  grooming at EOB with Mod A. Performing sit<>stand with stedy and Max A +2     Vision   Vision Assessment?: Vision impaired- to be further tested in functional context   Perception     Praxis      Cognition Arousal/Alertness: Awake/alert Behavior During Therapy: Restless;Impulsive Overall Cognitive Status: Impaired/Different from baseline Area of Impairment: Attention;Following commands;Memory;Orientation;Safety/judgement;Awareness;Problem solving;Rancho level               Rancho Levels of Cognitive Functioning [place]ant Scales of Cognitive Functioning: Confused/inappropriate/non-agitated Orientation Level: Disoriented to;Place;Time Current Attention Level: Sustained Memory: Decreased recall of precautions;Decreased short-term memory Following Commands: Follows one step commands with increased time Safety/Judgement: Decreased awareness of safety;Decreased awareness of deficits Awareness: Emergent("I have to poo") Problem Solving: Slow processing;Decreased initiation;Difficulty sequencing;Requires verbal cues;Requires tactile cues General Comments: pt does best with short simple commands like "stand", "sit"  pt unable to follow multistep commands, pt perseverates as well        Exercises     Shoulder Instructions       General Comments pt attempted to have BM but unsuccessful    Pertinent Vitals/ Pain       Pain Assessment: Faces Faces Pain Scale: Hurts even more Pain Location: L arm with movement Pain Descriptors / Indicators: Grimacing;Moaning Pain Intervention(s): Monitored during session;Limited activity within patient's tolerance;Repositioned  Home Living                                          Prior Functioning/Environment              Frequency  Min 2X/week        Progress Toward Goals  OT Goals(current goals can now be found in the care plan section)  Progress towards OT goals: Progressing toward goals  Acute  Rehab OT Goals Patient Stated Goal: to go get coffee OT Goal Formulation: Patient unable to participate in goal setting Time For Goal Achievement: 06/14/17 Potential to Achieve Goals: Fair ADL Goals Pt Will Perform Eating: with min assist;sitting Pt Will Perform Grooming: with min assist;sitting Pt Will Perform Upper Body Bathing: with mod assist;sitting Pt Will Transfer to Toilet: with mod assist;with +2 assist;bedside commode Additional ADL Goal #1: Pt will sustain attention to simple ADL task x 4 mins with min cues Additional ADL Goal #2: Pt will demonstrates intellectual awareness of deficits  Plan Discharge plan remains appropriate    Co-evaluation    PT/OT/SLP Co-Evaluation/Treatment: Yes Reason for Co-Treatment: Complexity of the patient's impairments (multi-system involvement);For patient/therapist safety;To address functional/ADL transfers PT goals addressed during session: Mobility/safety with mobility OT goals addressed during session: ADL's and self-care      AM-PAC PT "6 Clicks" Daily Activity     Outcome Measure   Help from another person eating meals?: A Lot Help from another person taking care of personal grooming?: A Lot Help from another person toileting, which includes using toliet, bedpan, or urinal?: Total Help from another person bathing (including washing, rinsing, drying)?: A Lot Help from another person to put on and taking off regular upper  body clothing?: Total Help from another person to put on and taking off regular lower body clothing?: Total 6 Click Score: 9    End of Session Equipment Utilized During Treatment: Gait belt  OT Visit Diagnosis: Unsteadiness on feet (R26.81);Cognitive communication deficit (R41.841) Symptoms and signs involving cognitive functions: (TBI)   Activity Tolerance Patient tolerated treatment well   Patient Left in bed;with call bell/phone within reach;with bed alarm set;with nursing/sitter in room   Nurse  Communication Mobility status        Time: [id number] OT Time Calculation (min): 27 min  Charges: OT General Charges $OT Visit: 1 Visit OT Treatments $Self Care/Home Management : 8-22 mins  Celica Kotowski MSOT, OTR/L Acute Rehab Pager: [phone] Office: [phone]   Theodoro GristCharis M Riccardo Holeman 06/14/2017, 2:07 PM

## 2017-06-14 NOTE — Progress Notes (Addendum)
Subjective: Patient reports "I'm so-so. My back itches then hurts"  Objective: Vital signs in last 24 hours: Temp:  [98 F (36.7 C)-98.6 F (37 C)] 98 F (36.7 C) (03/26 0504) Pulse Rate:  [92-108] 100 (03/26 0504) Resp:  [18-20] 20 (03/26 0504) BP: (124-137)/(76-88) 130/76 (03/26 0504) SpO2:  [95 %-98 %] 97 % (03/26 0504)  Intake/Output from previous day: 03/25 0701 - 03/26 0700 In: 1560 [P.O.:1560] Out: 1150 [Urine:1150] Intake/Output this shift: No intake/output data recorded.  awakens to vioce this am, following commands with right side, attempting to assist in logroll. Sitter notes report rec'd included good sleep overnight, talkative this am.   Lab Results: No results for input(s): WBC, HGB, HCT, PLT in the last 72 hours. BMET No results for input(s): NA, K, CL, CO2, GLUCOSE, BUN, CREATININE, CALCIUM in the last 72 hours.  Studies/Results: No results found.  Assessment/Plan: stable  LOS: 32 days  Awaiting SNF   Georgiann Cockeroteat, Brian 06/14/2017, 8:37 AM   Patient is stable.  Awaiting placement.

## 2017-06-14 NOTE — Progress Notes (Signed)
Physical Therapy Treatment Patient Details Name: Christopher ReichertManuel Shaffer MRN: [id number] DOB: 02-03-1966 Today's Date: 06/14/2017    History of Present Illness Patient is a 52 yo male, chronic alcoholic admitted after being found face down with evidence of head injury.  CT of head showed acute depressed Rt fronto-temporal skull fx with frontotemporal confluent acute hemorrhagic contusions, small Lt frontal lobe contusion, 6mm Rt > Lt midline shift with Rt uncal herniation, Rt to Lt ventricular entrapments as well ass R-IVH, SAH, and B-SDH.   He was also found to have questionable of cortical avulsion fracture of dorsal aspect of the Lt olecranon possible at insertion of triceps tendon.  Other significant PMH includes ETOH, CKD, HTN, AAA, DM, and h/o prior traumatic SAH     PT Comments    Pt with improved static sitting balance this date but con't to require maxA for all mobility. Pt did tolerate standing better in stedy this date. Pt con't to be unable to use L UE functionally and con't to push with R UE into L lateral lean. Acute PT to con't to follow.  Follow Up Recommendations  SNF;Supervision/Assistance - 24 hour     Equipment Recommendations  Wheelchair cushion (measurements PT);Wheelchair (measurements PT);Hospital bed    Recommendations for Other Services Rehab consult     Precautions / Restrictions Precautions Precautions: Fall Precaution Comments: TBI Required Braces or Orthoses: Sling Restrictions Weight Bearing Restrictions: Yes LUE Weight Bearing: Non weight bearing    Mobility  Bed Mobility Overal bed mobility: Needs Assistance Bed Mobility: Supine to Sit;Sit to Supine Rolling: Min assist(initiated rolling to the L)   Supine to sit: Max assist Sit to supine: Max assist   General bed mobility comments: pt reaches for assit with R UE, does not use L UE functionally.   Transfers Overall transfer level: Needs assistance   Transfers: Sit to/from Stand Sit to Stand:  Max assist;+2 physical assistance         General transfer comment: completed 1 stand with maxAx2 and 3 stand with stedy. pt with improved posture with stedy but remains to require maxA pt with strong L lateral lean and unable to use L UE to support or assist. Pt only able to tolerate approx 30 sec each time with maxA and directional v/c's  Ambulation/Gait             General Gait Details: unable   Stairs            Wheelchair Mobility    Modified Rankin (Stroke Patients Only) Modified Rankin (Stroke Patients Only) Pre-Morbid Rankin Score: No symptoms Modified Rankin: Severe disability     Balance Overall balance assessment: Needs assistance Sitting-balance support: Feet supported;Single extremity supported Sitting balance-Leahy Scale: Poor Sitting balance - Comments: pt able to maintain static sitting balance at EOB with close min guard but requires maxA to maintain balance to complete task ie. brush teeth Postural control: Left lateral lean;Posterior lean Standing balance support: Single extremity supported Standing balance-Leahy Scale: Zero Standing balance comment: dependent on physical assist                            Cognition Arousal/Alertness: Awake/alert Behavior During Therapy: Restless;Impulsive Overall Cognitive Status: Impaired/Different from baseline Area of Impairment: Attention;Following commands;Memory;Orientation;Safety/judgement;Awareness;Problem solving;Rancho level               Rancho Levels of Cognitive Functioning Ranch[place] MirantLos Amigos Scales of Cognitive Functioning: Confused/inappropriate/non-agitated Orientation Level: Disoriented to;Place;Time Current Attention Level: Sustained  Memory: Decreased recall of precautions;Decreased short-term memory Following Commands: Follows one step commands with increased time Safety/Judgement: Decreased awareness of safety;Decreased awareness of deficits Awareness: Emergent("I have to  poo") Problem Solving: Slow processing;Decreased initiation;Difficulty sequencing;Requires verbal cues;Requires tactile cues General Comments: pt does best with short simple commands like "stand", "sit"  pt unable to follow multistep commands, pt perseverates as well      Exercises      General Comments General comments (skin integrity, edema, etc.): pt attempted to have BM but unsuccessful      Pertinent Vitals/Pain Pain Assessment: Faces Faces Pain Scale: Hurts even more Pain Location: L arm with movement Pain Descriptors / Indicators: Grimacing;Moaning Pain Intervention(s): Limited activity within patient's tolerance    Home Living                      Prior Function            PT Goals (current goals can now be found in the care plan section) Progress towards PT goals: Progressing toward goals    Frequency    Min 2X/week      PT Plan Current plan remains appropriate    Co-evaluation PT/OT/SLP Co-Evaluation/Treatment: Yes Reason for Co-Treatment: Complexity of the patient's impairments (multi-system involvement) PT goals addressed during session: Mobility/safety with mobility OT goals addressed during session: ADL's and self-care      AM-PAC PT "6 Clicks" Daily Activity  Outcome Measure  Difficulty turning over in bed (including adjusting bedclothes, sheets and blankets)?: Unable Difficulty moving from lying on back to sitting on the side of the bed? : Unable Difficulty sitting down on and standing up from a chair with arms (e.g., wheelchair, bedside commode, etc,.)?: Unable Help needed moving to and from a bed to chair (including a wheelchair)?: Total Help needed walking in hospital room?: Total Help needed climbing 3-5 steps with a railing? : Total 6 Click Score: 6    End of Session   Activity Tolerance: Patient tolerated treatment well Patient left: in bed;with call bell/phone within reach;with bed alarm set;with nursing/sitter in room Nurse  Communication: Mobility status PT Visit Diagnosis: Muscle weakness (generalized) (M62.81);History of falling (Z91.81);Difficulty in walking, not elsewhere classified (R26.2);Other symptoms and signs involving the nervous system (R29.898)     Time: 1610-9604 PT Time Calculation (min) (ACUTE ONLY): 29 min  Charges:  $Neuromuscular Re-education: 8-22 mins                    G Codes:       Lewis Shock, PT, DPT Pager #: [phone] Office #: [phone]    Mikhia Dusek M Lanyah Spengler 06/14/2017, 1:23 PM

## 2017-06-15 LAB — GLUCOSE, CAPILLARY
GLUCOSE-CAPILLARY: 92 mg/dL (ref 65–99)
GLUCOSE-CAPILLARY: 95 mg/dL (ref 65–99)
Glucose-Capillary: 86 mg/dL (ref 65–99)
Glucose-Capillary: 95 mg/dL (ref 65–99)

## 2017-06-15 NOTE — Progress Notes (Addendum)
Subjective: Patient reports " I need help with this diaper" "Tell my mom I need her"  Objective: Vital signs in last 24 hours: Temp:  [97.7 F (36.5 C)-98.8 F (37.1 C)] 97.9 F (36.6 C) (03/27 0752) Pulse Rate:  [87-117] 87 (03/27 0752) Resp:  [17-19] 18 (03/27 0752) BP: (135-173)/(84-104) 162/98 (03/27 0752) SpO2:  [96 %-98 %] 97 % (03/27 0752)  Intake/Output from previous day: 03/26 0701 - 03/27 0700 In: 840 [P.O.:840] Out: 450 [Urine:450] Intake/Output this shift: No intake/output data recorded.  Awake and yellling out. Calms when spoken to. Follows commands this morning, assisting with logroll. Reorients to place, but confusion persists.    Lab Results: No results for input(s): WBC, HGB, HCT, PLT in the last 72 hours. BMET No results for input(s): NA, K, CL, CO2, GLUCOSE, BUN, CREATININE, CALCIUM in the last 72 hours.  Studies/Results: No results found.  Assessment/Plan:   LOS: 33 days  Repositioned in bed, Depends reapplied and secured, Awaiting SNF.   Georgiann Cockeroteat, Brian 06/15/2017, 9:00 AM

## 2017-06-16 LAB — GLUCOSE, CAPILLARY
GLUCOSE-CAPILLARY: 81 mg/dL (ref 65–99)
Glucose-Capillary: 100 mg/dL — ABNORMAL HIGH (ref 65–99)
Glucose-Capillary: 135 mg/dL — ABNORMAL HIGH (ref 65–99)
Glucose-Capillary: 95 mg/dL (ref 65–99)

## 2017-06-16 NOTE — Progress Notes (Signed)
  Speech Language Pathology Treatment: Cognitive-Linquistic  Patient Details Name: [name] MRN: [id number] DOB: 07/10/1965 Today's Date: 06/16/2017 Time: [id number] SLP Time Calculation (min) (ACUTE ONLY): 12 min  Assessment / Plan / Recommendation Clinical Impression  Pt presents with lethargy and limited arousal and participation in intervention despite max verbal/tactile/sensory cues. SLP provided total A during functional tasks for basic functional problem solving and focused attention. SLP attempted to trial thin liquids with pt, but limited alertness prevented trials.Recommend continuing nectar thick liquids and puree solids diet with aspiration precautions and full supervision/assist with oral intake. SLP will continue to follow.    HPI HPI: Patient is a 52 yo male, chronic alcoholic with right ICH, small SDH and skull fracture with right to left shift increased from 6 to 8mm per CT 05/14/17. Thrombocytopenia with likely poorly functioning platelets. Left hemiparesis with left neglect and loss of vision on left.      SLP Plan  Continue with current plan of care       Recommendations  Diet recommendations: Dysphagia 1 (puree);Nectar-thick liquid Liquids provided via: Cup Medication Administration: Crushed with puree Supervision: Staff to assist with self feeding;Full supervision/cueing for compensatory strategies Compensations: Small sips/bites;Minimize environmental distractions;Slow rate Postural Changes and/or Swallow Maneuvers: Seated upright 90 degrees;Upright 30-60 min after meal                Oral Care Recommendations: Oral care BID Follow up Recommendations: Skilled Nursing facility SLP Visit Diagnosis: Cognitive communication deficit (R41.841);Dysphagia, oropharyngeal phase (R13.12) Plan: Continue with current plan of care       GO              [place] Almeter Westhoff SLP Student Clinician  [place] Cordarro Spinnato 06/16/2017, 4:26 PM

## 2017-06-16 NOTE — Progress Notes (Addendum)
Subjective: Patient reports "Oww"  Objective: Vital signs in last 24 hours: Temp:  [97.9 F (36.6 C)-98.7 F (37.1 C)] 98.7 F (37.1 C) (03/28 0411) Pulse Rate:  [84-120] 102 (03/28 0411) Resp:  [18-20] 20 (03/28 0411) BP: (117-164)/(78-103) 117/84 (03/28 0411) SpO2:  [90 %-99 %] 90 % (03/28 0411)  Intake/Output from previous day: No intake/output data recorded. Intake/Output this shift: No intake/output data recorded.  Sleepy soundly, rouses to nailbed pressure. VSS. No evidence of distress - Typically wakes later in the am.   Lab Results: No results for input(s): WBC, HGB, HCT, PLT in the last 72 hours. BMET No results for input(s): NA, K, CL, CO2, GLUCOSE, BUN, CREATININE, CALCIUM in the last 72 hours.  Studies/Results: No results found.  Assessment/Plan: stable  LOS: 34 days  Awaits SNF   Georgiann Cockeroteat, Brian 06/16/2017, 8:02 AM  Patient is stable.  Awaiting placement.

## 2017-06-16 NOTE — Progress Notes (Signed)
Physical Therapy Treatment Patient Details Name: [name] MRN: [id number] DOB: October 22, 1965 Today's Date: 06/16/2017    History of Present Illness Patient is a 52 yo male, chronic alcoholic admitted after being found face down with evidence of head injury.  CT of head showed acute depressed Rt fronto-temporal skull fx with frontotemporal confluent acute hemorrhagic contusions, small Lt frontal lobe contusion, 6mm Rt > Lt midline shift with Rt uncal herniation, Rt to Lt ventricular entrapments as well ass R-IVH, SAH, and B-SDH.   He was also found to have questionable of cortical avulsion fracture of dorsal aspect of the Lt olecranon possible at insertion of triceps tendon.  Other significant PMH includes ETOH, CKD, HTN, AAA, DM, and h/o prior traumatic SAH     PT Comments    Patient progressing slowly due to lethargy and poor deficit awareness and perceptual deficits.  Feel he will need extended rehab in STSNF setting.  PT to follow acutely.  Will update goals next session.   Follow Up Recommendations  SNF;Supervision/Assistance - 24 hour     Equipment Recommendations  Wheelchair cushion (measurements PT);Wheelchair (measurements PT);Hospital bed    Recommendations for Other Services       Precautions / Restrictions Precautions Precaution Comments: TBI Restrictions Weight Bearing Restrictions: Yes LUE Weight Bearing: Non weight bearing    Mobility  Bed Mobility Overal bed mobility: Needs Assistance Bed Mobility: Supine to Sit;Sit to Supine   Sidelying to sit: Max assist;+2 for physical assistance   Sit to supine: Max assist;+2 for physical assistance   General bed mobility comments: assist for L leg off bed and max cues and guiding to move R leg, assist to lift trunk, easier using head to right  Transfers Overall transfer level: Needs assistance   Transfers: Sit to/from Stand Sit to Stand: Max assist;+2 physical assistance         General transfer comment:  attempted x 2 with Stedy, but pt not taking weight in LE's; assist to stand with +2 assist from bed able to stand briefly with L LE blocked and assist under hips, not stable enough for OOB transfer  Ambulation/Gait                 Stairs            Wheelchair Mobility    Modified Rankin (Stroke Patients Only) Modified Rankin (Stroke Patients Only) Pre-Morbid Rankin Score: No symptoms Modified Rankin: Severe disability     Balance Overall balance assessment: Needs assistance Sitting-balance support: Feet supported Sitting balance-Leahy Scale: Poor Sitting balance - Comments: able to sit briefly with min guard A, but cannot maintain, best after reaching to R due to L lateral and posterior lean Postural control: Left lateral lean;Posterior lean   Standing balance-Leahy Scale: Zero Standing balance comment: dependent on physical assist                            Cognition Arousal/Alertness: Lethargic Behavior During Therapy: Restless Overall Cognitive Status: Impaired/Different from baseline Area of Impairment: Attention;Following commands;Memory;Safety/judgement;Awareness;Problem solving;Rancho level               Rancho Levels of Cognitive Functioning Rancho Los Amigos Scales of Cognitive Functioning: Confused/inappropriate/non-agitated   Current Attention Level: Focused Memory: Decreased short-term memory Following Commands: Follows one step commands with increased time;Follows one step commands inconsistently     Problem Solving: Slow processing;Decreased initiation;Requires verbal cues;Requires tactile cues;Difficulty sequencing General Comments: does best when given sharp commands  and with visual attention      Exercises Other Exercises Other Exercises: L LE PROM And attempting to activate into resistance, but none noted Other Exercises: L shoulder ROM after reducing tone; increased flexor tone    General Comments        Pertinent  Vitals/Pain Pain Assessment: Faces Faces Pain Scale: Hurts little more Pain Location: L arm with movement Pain Descriptors / Indicators: Grimacing;Moaning Pain Intervention(s): Monitored during session;Repositioned;Limited activity within patient's tolerance    Home Living                      Prior Function            PT Goals (current goals can now be found in the care plan section) Progress towards PT goals: Progressing toward goals(slowly)    Frequency    Min 2X/week      PT Plan Current plan remains appropriate    Co-evaluation              AM-PAC PT "6 Clicks" Daily Activity  Outcome Measure  Difficulty turning over in bed (including adjusting bedclothes, sheets and blankets)?: Unable Difficulty moving from lying on back to sitting on the side of the bed? : Unable Difficulty sitting down on and standing up from a chair with arms (e.g., wheelchair, bedside commode, etc,.)?: Unable Help needed moving to and from a bed to chair (including a wheelchair)?: Total Help needed walking in hospital room?: Total Help needed climbing 3-5 steps with a railing? : Total 6 Click Score: 6    End of Session Equipment Utilized During Treatment: Gait belt Activity Tolerance: Patient limited by lethargy Patient left: in bed;with call bell/phone within reach;with bed alarm set   PT Visit Diagnosis: Muscle weakness (generalized) (M62.81);History of falling (Z91.81);Difficulty in walking, not elsewhere classified (R26.2);Other symptoms and signs involving the nervous system (R29.898)     Time: [id number] PT Time Calculation (min) (ACUTE ONLY): 20 min  Charges:  $Therapeutic Activity: 8-22 mins                    G CodesSheran Shaffer:       Cyndi Haelie Clapp, [place] CarolinaPT [phone] 06/16/2017    Elray Mcgregorynthia Beatrice Ziehm 06/16/2017, 4:46 PM

## 2017-06-17 LAB — GLUCOSE, CAPILLARY
GLUCOSE-CAPILLARY: 104 mg/dL — AB (ref 65–99)
Glucose-Capillary: 90 mg/dL (ref 65–99)
Glucose-Capillary: 98 mg/dL (ref 65–99)
Glucose-Capillary: 122 mg/dL — ABNORMAL HIGH (ref 65–99)

## 2017-06-17 NOTE — Progress Notes (Addendum)
Subjective: Patient reports "What?  Manny Reading"  Objective: Vital signs in last 24 hours: Temp:  [97.8 F (36.6 C)-99.6 F (37.6 C)] 99.1 F (37.3 C) (03/29 0438) Pulse Rate:  [96-116] 107 (03/29 0438) Resp:  [16-20] 18 (03/29 0438) BP: (110-172)/(69-93) 134/90 (03/29 0438) SpO2:  [98 %-100 %] 98 % (03/29 0438)  Intake/Output from previous day: 03/28 0701 - 03/29 0700 In: 600 [P.O.:600] Out: 600 [Urine:600] Intake/Output this shift: No intake/output data recorded.  Awakens to voice and states name. Somnolent. Ne evidence of distress or discomfort. Moves right side.  Lab Results: No results for input(s): WBC, HGB, HCT, PLT in the last 72 hours. BMET No results for input(s): NA, K, CL, CO2, GLUCOSE, BUN, CREATININE, CALCIUM in the last 72 hours.  Studies/Results: No results found.  Assessment/Plan: stable  LOS: 35 days  Awaits SNF placement   Georgiann Cockeroteat, Brian 06/17/2017, 7:42 AM  Patient is still awaiting placement.

## 2017-06-17 NOTE — Care Management Note (Signed)
Case Management Note  Patient Details  Name: Christopher ReichertManuel Shaffer MRN: [id number] Date of Birth: Oct 30, 1965  Subjective/Objective:                    Action/Plan: Patient is DTP. CSW working on placement. CM following.    Expected Discharge Date:  05/20/17               Expected Discharge Plan:  Skilled Nursing Facility  In-House Referral:  Clinical Social Work  Discharge planning Services  CM Consult  Post Acute Care Choice:  NA Choice offered to:  NA  DME Arranged:  N/A DME Agency:  NA  HH Arranged:  NA HH Agency:  NA  Status of Service:  In process, will continue to follow  If discussed at Long Length of Stay Meetings, dates discussed:    Additional Comments:  [name] F [name], RN 06/17/2017, 10:48 AM

## 2017-06-17 NOTE — Progress Notes (Signed)
Report received, agree with assessment as documented. 

## 2017-06-17 NOTE — Plan of Care (Signed)
  Problem: Education: Goal: Knowledge of General Education information will improve Outcome: Progressing   Problem: Health Behavior/Discharge Planning: Goal: Ability to manage health-related needs will improve Outcome: Progressing   

## 2017-06-18 LAB — GLUCOSE, CAPILLARY
GLUCOSE-CAPILLARY: 89 mg/dL (ref 65–99)
Glucose-Capillary: 100 mg/dL — ABNORMAL HIGH (ref 65–99)
Glucose-Capillary: 118 mg/dL — ABNORMAL HIGH (ref 65–99)
Glucose-Capillary: 120 mg/dL — ABNORMAL HIGH (ref 65–99)

## 2017-06-18 NOTE — Progress Notes (Signed)
Overall stable.  No new issues or problems.  Patient remains confused and disoriented.  Afebrile with stable vital signs.  Awake and aware.  Disoriented to place time and situation.  Moving all extremities symmetrically to command.  Status post traumatic brain injury.  Continue supportive efforts.  E awaiting skilled nursing facility placement.

## 2017-06-19 LAB — GLUCOSE, CAPILLARY
GLUCOSE-CAPILLARY: 147 mg/dL — AB (ref 65–99)
Glucose-Capillary: 97 mg/dL (ref 65–99)
Glucose-Capillary: 126 mg/dL — ABNORMAL HIGH (ref 65–99)
Glucose-Capillary: 97 mg/dL (ref 65–99)

## 2017-06-19 NOTE — Progress Notes (Signed)
Subjective: The patient is alert and pleasant.  He has no complaints.  Objective: Vital signs in last 24 hours: Temp:  [98 F (36.7 C)-99 F (37.2 C)] 98 F (36.7 C) (03/31 0816) Pulse Rate:  [90-109] 109 (03/31 0816) Resp:  [16-20] 18 (03/31 0816) BP: (123-153)/(79-98) 153/98 (03/31 0816) SpO2:  [97 %-99 %] 97 % (03/31 0816) Estimated body mass index is 25.09 kg/m as calculated from the following:   Height as of this encounter: 6' (1.829 m).   Weight as of this encounter: 83.9 kg (184 lb 15.5 oz).   Intake/Output from previous day: 03/30 0701 - 03/31 0700 In: 800 [P.O.:800] Out: 300 [Urine:300] Intake/Output this shift: No intake/output data recorded.  Physical exam the patient is alert and conversant.  Lab Results: No results for input(s): WBC, HGB, HCT, PLT in the last 72 hours. BMET No results for input(s): NA, K, CL, CO2, GLUCOSE, BUN, CREATININE, CALCIUM in the last 72 hours.  Studies/Results: No results found.  Assessment/Plan: Postop day #37: Stable  LOS: 37 days     Cristi LoronJeffrey D Wilmore Holsomback 06/19/2017, 8:56 AM

## 2017-06-19 NOTE — Progress Notes (Signed)
Late entry for 06/19/17 @2000 : Patient lying on left side with pajama bottoms off. He was calling out. I went in to assess patient at this time. I washed his face, gave him ensure thickened to nectar consistency. He drank it all. I washed him up, put pajama bottoms on, gave emotional support.  Denies pain. Bed alarm on. Will continue to monitor.

## 2017-06-20 LAB — GLUCOSE, CAPILLARY
GLUCOSE-CAPILLARY: 81 mg/dL (ref 65–99)
GLUCOSE-CAPILLARY: 103 mg/dL — AB (ref 65–99)
GLUCOSE-CAPILLARY: 91 mg/dL (ref 65–99)
Glucose-Capillary: 122 mg/dL — ABNORMAL HIGH (ref 65–99)

## 2017-06-20 NOTE — Progress Notes (Signed)
  Speech Language Pathology Treatment: Dysphagia;Cognitive-Linquistic  Patient Details Name: Christopher ReichertManuel Shaffer MRN: [id number] DOB: 10/04/1965 Today's Date: 06/20/2017 Time: [id number] SLP Time Calculation (min) (ACUTE ONLY): 14 min  Assessment / Plan / Recommendation Clinical Impression  Pt continues to be followed for cognition and dysphagia. Alert and participatory; just finishing lunch.  Assisted with purees, nectars, which he continues to tolerate well when alert.  No thin liquid trials today.  Will evaluate next session for readiness for diet advancement.  Continues to require total assist for orientation to time, length of time spent in hospital, and reason he is here. Mod verbal cues provided to sustain attention during conversational exchange; internal distractions are primary interference.  SLP will continue to follow - somnolence has been primary barrier over last several sessions, but improved MS today.   HPI HPI: Patient is a 52 yo male, chronic alcoholic with right ICH, small SDH and skull fracture with right to left shift increased from 6 to 8mm per CT 05/14/17. Thrombocytopenia with likely poorly functioning platelets. Left hemiparesis with left neglect and loss of vision on left.      SLP Plan  Continue with current plan of care       Recommendations  Diet recommendations: Dysphagia 1 (puree);Nectar-thick liquid Liquids provided via: Cup Medication Administration: Crushed with puree Supervision: Staff to assist with self feeding;Full supervision/cueing for compensatory strategies Compensations: Small sips/bites;Minimize environmental distractions;Slow rate Postural Changes and/or Swallow Maneuvers: Seated upright 90 degrees;Upright 30-60 min after meal                Oral Care Recommendations: Oral care BID Follow up Recommendations: Skilled Nursing facility SLP Visit Diagnosis: Cognitive communication deficit (R41.841);Dysphagia, oropharyngeal phase (R13.12) Plan:  Continue with current plan of care       GO                Christopher MountsCouture, [name] 06/20/2017, 12:08 PM

## 2017-06-20 NOTE — Care Management Note (Signed)
Case Management Note  Patient Details  Name: Carlyn ReichertManuel Herne MRN: [id number] Date of Birth: 03-13-1966  Subjective/Objective:                    Action/Plan: Pt continues on the DTP list. CM following for d/c disposition.  Expected Discharge Date:  05/20/17               Expected Discharge Plan:  Skilled Nursing Facility  In-House Referral:  Clinical Social Work  Discharge planning Services  CM Consult  Post Acute Care Choice:  NA Choice offered to:  NA  DME Arranged:  N/A DME Agency:  NA  HH Arranged:  NA HH Agency:  NA  Status of Service:  In process, will continue to follow  If discussed at Long Length of Stay Meetings, dates discussed:    Additional Comments:  [name] F Harpreet Signore, RN 06/20/2017, 11:54 AM

## 2017-06-20 NOTE — Progress Notes (Addendum)
Subjective: Patient reports "I'm just cold right now. Thank you"  Objective: Vital signs in last 24 hours: Temp:  [97.3 F (36.3 C)-98.4 F (36.9 C)] 97.3 F (36.3 C) (04/01 0500) Pulse Rate:  [102-114] 103 (04/01 0500) Resp:  [18-20] 20 (04/01 0500) BP: (103-153)/(81-98) 150/95 (04/01 0500) SpO2:  [96 %-99 %] 99 % (04/01 0500)  Intake/Output from previous day: No intake/output data recorded. Intake/Output this shift: No intake/output data recorded.  Awakens to voice. No complaint of pain. Moves right side on command.  Lab Results: No results for input(s): WBC, HGB, HCT, PLT in the last 72 hours. BMET No results for input(s): NA, K, CL, CO2, GLUCOSE, BUN, CREATININE, CALCIUM in the last 72 hours.  Studies/Results: No results found.  Assessment/Plan: stable  LOS: 38 days  Awaiting SNF   Georgiann Cockeroteat, Brian 06/20/2017, 7:21 AM   Awaiting placement.  Neurologically stable.

## 2017-06-20 NOTE — Plan of Care (Signed)
Pt continues to make slow progress towards his goals
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
## 2017-06-21 LAB — GLUCOSE, CAPILLARY
GLUCOSE-CAPILLARY: 88 mg/dL (ref 65–99)
GLUCOSE-CAPILLARY: 86 mg/dL (ref 65–99)
Glucose-Capillary: 122 mg/dL — ABNORMAL HIGH (ref 65–99)
Glucose-Capillary: 135 mg/dL — ABNORMAL HIGH (ref 65–99)

## 2017-06-21 NOTE — Progress Notes (Addendum)
Subjective: Patient reports "I'm alright"  Objective: Vital signs in last 24 hours: Temp:  [97.5 F (36.4 C)-98.2 F (36.8 C)] 98 F (36.7 C) (04/02 0400) Pulse Rate:  [99-112] 112 (04/02 0400) Resp:  [16-22] 18 (04/02 0400) BP: (118-143)/(73-99) 118/73 (04/02 0400) SpO2:  [94 %-98 %] 97 % (04/02 0000)  Intake/Output from previous day: 04/01 0701 - 04/02 0700 In: 900 [P.O.:900] Out: -  Intake/Output this shift: No intake/output data recorded.  Sleeping quietly, awakens to voice. Denies pain. Moves right side.   Lab Results: No results for input(s): WBC, HGB, HCT, PLT in the last 72 hours. BMET No results for input(s): NA, K, CL, CO2, GLUCOSE, BUN, CREATININE, CALCIUM in the last 72 hours.  Studies/Results: No results found.  Assessment/Plan: stable  LOS: 39 days  Awaiting SNF   Georgiann Cockeroteat, Brian 06/21/2017, 7:39 AM   Continue support.  Awaiting placement.
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
## 2017-06-22 LAB — GLUCOSE, CAPILLARY
GLUCOSE-CAPILLARY: 76 mg/dL (ref 65–99)
GLUCOSE-CAPILLARY: 162 mg/dL — AB (ref 65–99)
GLUCOSE-CAPILLARY: 83 mg/dL (ref 65–99)
Glucose-Capillary: 95 mg/dL (ref 65–99)

## 2017-06-22 MED ORDER — LORAZEPAM 2 MG/ML IJ SOLN
1.0000 mg | INTRAMUSCULAR | Status: DC | PRN
  Administered 2017-06-22 (×4): 2 mg via INTRAVENOUS
  Administered 2017-06-23: 1 mg via INTRAVENOUS
  Administered 2017-06-24: 2 mg via INTRAVENOUS
  Filled 2017-06-22 (×8): qty 1

## 2017-06-22 MED ORDER — HALOPERIDOL LACTATE 5 MG/ML IJ SOLN
5.0000 mg | Freq: Once | INTRAMUSCULAR | Status: AC
  Administered 2017-06-22: 5 mg via INTRAMUSCULAR
  Filled 2017-06-22: qty 1

## 2017-06-22 MED ORDER — LORAZEPAM 2 MG/ML IJ SOLN
2.0000 mg | Freq: Once | INTRAMUSCULAR | Status: AC
  Administered 2017-06-22: 2 mg via INTRAMUSCULAR

## 2017-06-22 NOTE — Progress Notes (Signed)
Pt's right elbow noted to be red, swollen and warm touch. Foam dsg applied to the elbow. Reported off to oncoming RN. Dionne BucyP. Amo Allyn Bartelson RN

## 2017-06-22 NOTE — Progress Notes (Addendum)
Pt has been increasingly agitated throughout the day, despite prn medications.  Removing hospital gown, turning to side with legs off of side of bed, attempting to get out of bed.  Recently, pt became more agitated and hitting and spitting at the nurse techs giving him a bath.  Arlys JohnBrian, Neurosurgery, called.  Obtained order for safety sitter.

## 2017-06-22 NOTE — Progress Notes (Signed)
  Speech Language Pathology Treatment: Dysphagia;Cognitive-Linquistic  Patient Details Name: Christopher ReichertManuel Shaffer MRN: [id number] DOB: 23-Jul-1965 Today's Date: 06/22/2017 Time: 1000-1040 SLP Time Calculation (min) (ACUTE ONLY): 40 min  Assessment / Plan / Recommendation Clinical Impression  DYSPHAGIA: Pt was observed with meds crushed in puree, provided by RN. Pt appeared to tolerate this consistency without difficulty or delay. SLP provided trials of ice chips, which pt enjoyed, requesting more. Sips of thin liquid via cup resulted in immediate cough response. [facility]. At this time, will continue Dys1/NTL diet, chin tuck with liquids. Ice chips are ok, given 1-2 at a time, checking for clearing between presentations.  COG/COM: Pt accurate for personal information, but was otherwise confused. Pt continues to require max cueing for orientation to time, length of time spent in hospital, and reason for hospitalization. Pt is highly internally distractible, focusing on calling out to someone who isn't there, and requesting to get out of bed.   SLP will continue to follow - somnolence has been primary barrier recently. Improved MS continues today, although Ativan required.    HPI HPI: Patient is a 52 yo male, chronic alcoholic with right ICH, small SDH and skull fracture with right to left shift increased from 6 to 8mm per CT 05/14/17. Thrombocytopenia with likely poorly functioning platelets. Left hemiparesis with left neglect and loss of vision on left.      SLP Plan  Continue with current plan of care       Recommendations  Diet recommendations: Nectar-thick liquid;Dysphagia 1 (puree) Liquids provided via: Cup Medication Administration: Crushed with puree Supervision: Staff to assist with self feeding;Full supervision/cueing for compensatory strategies Compensations: Small sips/bites;Minimize environmental distractions;Slow rate;Chin  tuck Postural Changes and/or Swallow Maneuvers: Seated upright 90 degrees;Upright 30-60 min after meal                Oral Care Recommendations: Oral care BID Follow up Recommendations: Skilled Nursing facility;24 hour supervision/assistance SLP Visit Diagnosis: Cognitive communication deficit (R41.841);Dysphagia, oropharyngeal phase (R13.12) Plan: Continue with current plan of care       GO               [name] B. [name], [facility]ic[facility] LLCMSP, CCC-SLP Speech Language Pathologist [phone]  [name], [name] 06/22/2017, 10:41 AM
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
## 2017-06-22 NOTE — Progress Notes (Addendum)
Subjective: Patient reports "I'm ok"  Objective: Vital signs in last 24 hours: Temp:  [97 F (36.1 C)-98.6 F (37 C)] 98.6 F (37 C) (04/03 0408) Pulse Rate:  [102-115] 108 (04/03 0408) Resp:  [18-20] 18 (04/03 0408) BP: (118-149)/(72-103) 149/97 (04/03 0408) SpO2:  [97 %-99 %] 99 % (04/03 0408)  Intake/Output from previous day: 04/02 0701 - 04/03 0700 In: 560 [P.O.:560] Out: -  Intake/Output this shift: No intake/output data recorded.  Awakens to voice. Answers simple questions with "yes"/"no" this morning. Follows commands with right arm and right leg.   Lab Results: No results for input(s): WBC, HGB, HCT, PLT in the last 72 hours. BMET No results for input(s): NA, K, CL, CO2, GLUCOSE, BUN, CREATININE, CALCIUM in the last 72 hours.  Studies/Results: No results found.  Assessment/Plan: stable  LOS: 40 days  Awaiting SNF placement   [name], [name] 06/22/2017, 7:36 AM   Patient is stable.  Awaiting placement.

## 2017-06-23 DIAGNOSIS — R451 Restlessness and agitation: Secondary | ICD-10-CM

## 2017-06-23 DIAGNOSIS — Z008 Encounter for other general examination: Secondary | ICD-10-CM

## 2017-06-23 DIAGNOSIS — R4587 Impulsiveness: Secondary | ICD-10-CM

## 2017-06-23 LAB — GLUCOSE, CAPILLARY
GLUCOSE-CAPILLARY: 143 mg/dL — AB (ref 65–99)
GLUCOSE-CAPILLARY: 113 mg/dL — AB (ref 65–99)
Glucose-Capillary: 106 mg/dL — ABNORMAL HIGH (ref 65–99)
Glucose-Capillary: 126 mg/dL — ABNORMAL HIGH (ref 65–99)

## 2017-06-23 NOTE — Consult Note (Addendum)
[facility] Face-to-Face Psychiatry Consult   Reason for Consult:  Agitation  Referring Physician:  Dr. Venetia Maxon Patient Identification: [name] MRN:  [id number] Principal Diagnosis: Agitation Diagnosis:   Patient Active Problem List   Diagnosis Date Noted  . Depression [F32.9] 06/06/2017  . Altered mental status [R41.82]   . Focal hemorrhagic contusion of cerebrum (HCC) [S06.339A]   . Subdural hematoma (HCC) [S06.5X9A]   . Benign essential HTN [I10]   . Stage 3 chronic kidney disease (HCC) [N18.3]   . ETOH abuse [F10.10]   . Hypokalemia [E87.6]   . Acute blood loss anemia [D62]   . Anemia of chronic disease [D63.8]   . TBI (traumatic brain injury) (HCC) [S06.9X9A] 05/13/2017  . Tachycardia [R00.0]   . Alcohol withdrawal (HCC) [F10.239] 04/03/2017  . DM2 (diabetes mellitus, type 2) (HCC) [E11.9] 04/03/2017  . 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 Frontal lobe contusion (HCC) [S06.339A] 01/23/2017  . Encephalopathy acute [G93.40]   . Fall [W19.XXXA]   . Subarachnoid hemorrhage following injury with brief loss of consciousness but without open intracranial wound (HCC) [S06.6X9A] 07/17/2016  . Traumatic subarachnoid hemorrhage (HCC) [S06.6X9A] 01/15/2015  . Alcohol intoxication (HCC) [F10.929] 01/15/2015  . SAH (subarachnoid hemorrhage) (HCC) [I60.9] 01/15/2015    Total Time spent with patient: 1 hour  Subjective:   [name] is a 52 y.o. male patient admitted with multiple injuries in the setting of alcohol intoxication.  HPI:   [name] was seen by the psychiatry consult service on 3/7 for SI. He did not appear to be at his baseline and was only oriented to person and place. He denied SI and was psychiatrically cleared. He was seen again on 3/18 for depression. He was started on Remeron 15 mg qhs for depression and insomnia. Psychiatry is consulted today for agitation. He has required Haldol and Ativan for belligerent  behavior. Per nursing note, he was hitting and spitting at the nurse techs who were trying to give him a bath. He received Haldol 5 mg and Ativan 2 mg yesterday evening. He has been receiving Ativan 1 mg q 1 hour PRN for anxiety (3 doses in the past 24 hours). He is still recommended for SNF placement and awaiting placement.   On interview, [name] begins speaking in Spanish but his sitter reminds him that he should speak English to this notewriter.  He previously completed a PT session but does not remember doing so.  He reports that he is at [facility] but believes that the date is 04/18/2001.  He perseverates on saying, "If I try hitting the bag before hitting the other bag will it go better for me?"  He denies problems with sleep or appetite.  He denies SI, HI or AVH.  Nursing report that he has been doing well today and he has been doing well since the sitter was placed.  She believes that his behaviors are a secondary gain for attention.  Past Psychiatric History: Alcohol abuse   Risk to Self: None. Denies SI.  Risk to Others:  None. Denies HI.  Prior Inpatient Therapy:  None  Prior Outpatient Therapy:  None  Past Medical History:  Past Medical History:  Diagnosis Date  . Abdominal aneurysm [facility])    Patient reported   . Chronic kidney disease, stage III (moderate) (HCC)    Deterding  . Hypertension   . Obesity (BMI 30-39.9)   . Other and unspecified hyperlipidemia   . Type II or unspecified  type diabetes mellitus without mention of complication, not stated as uncontrolled     Past Surgical History:  Procedure Laterality Date  . stomach vessel aneurysm     Patient reported    Family History:  Family History  Problem Relation Age of Onset  . Diabetes Mellitus II Mother    Family Psychiatric  History: Denies  Social History:  Social History   Substance and Sexual Activity  Alcohol Use Yes  . Alcohol/week: 7.2 oz  . Types: 12 Cans of beer per week      Social History   Substance and Sexual Activity  Drug Use No    Social History   Socioeconomic History  . Marital status: Married    Spouse name: Not on file  . Number of children: Not on file  . Years of education: Not on file  . Highest education level: Not on file  Occupational History  . Not on file  Social Needs  . Financial resource strain: Not on file  . Food insecurity:    Worry: Not on file    Inability: Not on file  . Transportation needs:    Medical: Not on file    Non-medical: Not on file  Tobacco Use  . Smoking status: Passive Smoke Exposure - Never Smoker  . Smokeless tobacco: Never Used  Substance and Sexual Activity  . Alcohol use: Yes    Alcohol/week: 7.2 oz    Types: 12 Cans of beer per week  . Drug use: No  . Sexual activity: Not on file  Lifestyle  . Physical activity:    Days per week: Not on file    Minutes per session: Not on file  . Stress: Not on file  Relationships  . Social connections:    Talks on phone: Not on file    Gets together: Not on file    Attends religious service: Not on file    Active member of club or organization: Not on file    Attends meetings of clubs or organizations: Not on file    Relationship status: Not on file  Other Topics Concern  . Not on file  Social History Narrative  . Not on file   Additional Social History: He lives at home alone.  He is separated from his wife.  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  He has a history of heavy alcohol use.    Allergies:  No Known Allergies  Labs:  Results for orders placed or performed during the hospital encounter of 05/13/17 (from the past 48 hour(s))  Glucose, capillary     Status: Abnormal   Collection Time: 06/21/17  4:22 PM  Result Value Ref Range   Glucose-Capillary 122 (H) 65 - 99 mg/dL  Glucose, capillary     Status: Abnormal   Collection Time: 06/21/17  9:27 PM  Result Value Ref Range   Glucose-Capillary 135 (H) 65  - 99 mg/dL   Comment 1 Notify RN    Comment 2 Document in Chart   Glucose, capillary     Status: None   Collection Time: 06/22/17  6:34 AM  Result Value Ref Range   Glucose-Capillary 76 65 - 99 mg/dL   Comment 1 Notify RN    Comment 2 Document in Chart   Glucose, capillary     Status: Abnormal   Collection Time: 06/22/17 11:50 AM  Result Value Ref Range   Glucose-Capillary 162 (H) 65 - 99 mg/dL  Comment 1 Notify RN    Comment 2 Document in Chart   Glucose, capillary     Status: None   Collection Time: 06/22/17  4:24 PM  Result Value Ref Range   Glucose-Capillary 95 65 - 99 mg/dL   Comment 1 Notify RN    Comment 2 Document in Chart   Glucose, capillary     Status: None   Collection Time: 06/22/17  9:35 PM  Result Value Ref Range   Glucose-Capillary 83 65 - 99 mg/dL   Comment 1 Notify RN    Comment 2 Document in Chart   Glucose, capillary     Status: Abnormal   Collection Time: 06/23/17  6:10 AM  Result Value Ref Range   Glucose-Capillary 143 (H) 65 - 99 mg/dL   Comment 1 Notify RN    Comment 2 Document in Chart   Glucose, capillary     Status: Abnormal   Collection Time: 06/23/17 11:23 AM  Result Value Ref Range   Glucose-Capillary 106 (H) 65 - 99 mg/dL   Comment 1 Notify RN    Comment 2 Document in Chart     Current Facility-Administered Medications  Medication Dose Route Frequency Provider Last Rate Last Dose  . acetaminophen (TYLENOL) tablet 650 mg  650 mg Oral Q4H PRN Maeola HarmanStern, Joseph, MD   650 mg at 05/21/17 0059   Or  . acetaminophen (TYLENOL) solution 650 mg  650 mg Per Tube Q4H PRN Maeola HarmanStern, Joseph, MD       Or  . acetaminophen (TYLENOL) suppository 650 mg  650 mg Rectal Q4H PRN Maeola HarmanStern, Joseph, MD   650 mg at 05/13/17 2045  . acetaminophen-codeine (TYLENOL #3) 300-30 MG per tablet 1-2 tablet  1-2 tablet Oral Q4H PRN Maeola HarmanStern, Joseph, MD   1 tablet at 06/23/17 0702  . bisacodyl (DULCOLAX) suppository 10 mg  10 mg Rectal Daily PRN Maeola HarmanStern, Joseph, MD   10 mg at 05/31/17  2126  . Chlorhexidine Gluconate Cloth 2 % PADS 6 each  6 each Topical Q1500 Maeola HarmanStern, Joseph, MD   6 each at 06/21/17 1530  . feeding supplement (ENSURE ENLIVE) (ENSURE ENLIVE) liquid 237 mL  237 mL Oral TID BM Maeola HarmanStern, Joseph, MD   237 mL at 06/23/17 [phone]  . folic acid (FOLVITE) tablet 1 mg  1 mg Oral Daily Maeola HarmanStern, Joseph, MD   1 mg at 06/23/17 [phone]  . insulin aspart (novoLOG) injection 0-15 Units  0-15 Units Subcutaneous TID AC & HS Maeola HarmanStern, Joseph, MD   2 Units at 06/23/17 [phone]  . labetalol (NORMODYNE,TRANDATE) injection 10-20 mg  10-20 mg Intravenous Q2H PRN Maeola HarmanStern, Joseph, MD   20 mg at 05/26/17 0410  . LORazepam (ATIVAN) injection 1-2 mg  1-2 mg Intravenous Q1H PRN Coletta Memosabbell, Kyle, MD   1 mg at 06/23/17 0141  . MEDLINE mouth rinse  15 mL Mouth Rinse BID Maeola HarmanStern, Joseph, MD   15 mL at 06/23/17 1049  . mirtazapine (REMERON) tablet 15 mg  15 mg Oral QHS Maeola HarmanStern, Joseph, MD   15 mg at 06/22/17 2228  . mupirocin ointment (BACTROBAN) 2 %   Nasal BID Maeola HarmanStern, Joseph, MD      . pantoprazole sodium (PROTONIX) 40 mg/20 mL oral suspension 40 mg  40 mg Oral Daily Maeola HarmanStern, Joseph, MD   40 mg at 06/23/17 [phone]  . RESOURCE THICKENUP CLEAR   Oral PRN Maeola HarmanStern, Joseph, MD      . senna-docusate (Senokot-S) tablet 1 tablet  1 tablet Oral BID Maeola HarmanStern, Joseph, MD  1 tablet at 06/22/17 1031  . thiamine (VITAMIN B-1) tablet 100 mg  100 mg Oral Daily Maeola Harman, MD   100 mg at 06/23/17 1610    Musculoskeletal: Strength & Muscle Tone: decreased due to physical deconditioning.  Gait & Station: UTA since patient was lying in bed. Patient leans: N/A  Psychiatric Specialty Exam: Physical Exam  Nursing note and vitals reviewed. Constitutional: He appears well-developed and well-nourished.  HENT:  Head: Normocephalic and atraumatic.  Respiratory: Effort normal.  Musculoskeletal: Normal range of motion.  Neurological: He is alert.  Oriented to person and place.  Psychiatric: He has a normal mood and affect. His speech is normal and  behavior is normal. Thought content normal. Cognition and memory are impaired. He expresses impulsivity.    Review of Systems  Constitutional: Negative for chills and fever.  Cardiovascular: Negative for chest pain.  Gastrointestinal: Negative for abdominal pain, constipation, diarrhea, nausea and vomiting.  Psychiatric/Behavioral: Negative for hallucinations and suicidal ideas. The patient does not have insomnia.   All other systems reviewed and are negative.   Blood pressure 127/88, pulse (!) 114, temperature 98.3 F (36.8 C), temperature source Axillary, resp. rate 19, height 6' (1.829 m), weight 83.9 kg (184 lb 15.5 oz), SpO2 97 %.Body mass index is 25.09 kg/m.  General Appearance: Fairly Groomed, middle aged, Caucasian male, wearing a hospital gown and lying in bed. NAD.   Eye Contact:  Fair  Speech:  Clear and Coherent and Normal Rate  Volume:  Decreased  Mood:  Euthymic  Affect:  Congruent  Thought Process:  Irrelevant and Descriptions of Associations: Loose  Orientation:  Other:  Person and place.  Thought Content:  Illogical  Suicidal Thoughts:  No  Homicidal Thoughts:  No  Memory:  Immediate;   Poor Recent;   Fair Remote;   Fair  Judgement:  Impaired  Insight:  Lacking  Psychomotor Activity:  Normal  Concentration:  Concentration: Fair and Attention Span: Fair  Recall:  Poor  Fund of Knowledge:  Poor  Language:  Fair  Akathisia:  No  Handed:  Right  AIMS (if indicated):   N/A  Assets:  Housing  ADL's:  Impaired  Cognition: Impaired due to medical condition.   Sleep:   Okay   Assessment:  [name] is a 52 y.o. male who was admitted with multiple injuries in the setting of alcohol intoxication.  He is known to the psychiatry service. He is not at his baseline.  He is only oriented to person and place and is a poor historian at this time. He was agitated yesterday and given Ativan and Haldol with poor effect. Recommend Zyprexa PRN for agitation and advise  against using Ativan since it can worsen mental status. He continues to deny SI, HI or AVH and does not warrant inpatient psychiatric hospitalization at this time.   Treatment Plan Summary: -Continue Remeron 15 mg qhs for depression and insomnia. May help to improve alcohol cravings and appetite.  -Recommend Zyprexa 5 mg BID PRN for agitation. Advise against using Ativan or other deliriogenic medications that can cause confusion or worsen mental status.  -EKG reviewed and QTc 487 on 2/22. Please closely monitor when starting or increasing QTc prolonging agents.  -Psychiatry will sign off on patient at this time. Please consult psychiatry again as needed.   Disposition: Patient does not meet criteria for psychiatric inpatient admission.  Cherly Beach, DO 06/23/2017 1:01 PM

## 2017-06-23 NOTE — Progress Notes (Signed)
Subjective: Patient reports "Hey sir! I'm actually scared...of that dog"  Objective: Vital signs in last 24 hours: Temp:  [98 F (36.7 C)-98.3 F (36.8 C)] 98.2 F (36.8 C) (04/04 0719) Pulse Rate:  [90-115] 114 (04/04 0719) Resp:  [16-20] 20 (04/04 0719) BP: (83-165)/(66-98) 138/98 (04/04 0719) SpO2:  [98 %-100 %] 99 % (04/04 0719)  Intake/Output from previous day: 04/03 0701 - 04/04 0700 In: 320 [P.O.:320] Out: -  Intake/Output this shift: No intake/output data recorded.  Awake, moves right arm and leg, left foot on command. Confusion remains. Episodes of agitation yesterday and apparently awake and talking since 3am without agitation.   Lab Results: No results for input(s): WBC, HGB, HCT, PLT in the last 72 hours. BMET No results for input(s): NA, K, CL, CO2, GLUCOSE, BUN, CREATININE, CALCIUM in the last 72 hours.  Studies/Results: No results found.  Assessment/Plan: stable  LOS: 41 days  Awaiting SNF placement.   [name], [name] 06/23/2017, 8:38 AM

## 2017-06-23 NOTE — Progress Notes (Signed)
Physical Therapy Treatment Patient Details Name: [name] MRN: [id number] DOB: Apr 17, 1965 Today's Date: 06/23/2017    History of Present Illness Patient is a 52 yo male, chronic alcoholic admitted after being found face down with evidence of head injury.  CT of head showed acute depressed Rt fronto-temporal skull fx with frontotemporal confluent acute hemorrhagic contusions, small Lt frontal lobe contusion, 6mm Rt > Lt midline shift with Rt uncal herniation, Rt to Lt ventricular entrapments as well ass R-IVH, SAH, and B-SDH.   He was also found to have questionable of cortical avulsion fracture of dorsal aspect of the Lt olecranon possible at insertion of triceps tendon.  Other significant PMH includes ETOH, CKD, HTN, AAA, DM, and h/o prior traumatic SAH     PT Comments    Pt with limited participation with PT today, unable to follow single step commands even with maximal verbal and tactile cueing. Pt total Ax2 for bed mobility, maxAx2 for sit>stand attempts using Stedy however unable to achieve as he would not weightbear through R LE. Pt has been agitated for past 2 days per nursing and is currently requiring a sitter.     Follow Up Recommendations  SNF;Supervision/Assistance - 24 hour     Equipment Recommendations  (TBD)       Precautions / Restrictions Precautions Precaution Comments: TBI Restrictions Weight Bearing Restrictions: Yes LUE Weight Bearing: Non weight bearing    Mobility  Bed Mobility Overal bed mobility: Needs Assistance Bed Mobility: Supine to Sit;Sit to Supine     Supine to sit: +2 for physical assistance;Total assist Sit to supine: +2 for physical assistance;Total assist   General bed mobility comments: Pt with inconsistent command following, would not move R LE to EoB as he had done in last session   Transfers Overall transfer level: Needs assistance Equipment used: Ambulation equipment used(Stedy) Transfers: Sit to/from Stand Sit to Stand: Max  assist;+2 physical assistance         General transfer comment: Pt given maximal verbal and tactile commands for placing R foot fully on platform of Stedy to come to standing. Pt attempted to come to standing however R foot slipped because he was not weightbearing through R LE. Reset pt and attempted again with similar results. Pt then requested to lay back down.  Ambulation/Gait             General Gait Details: unable         Balance Overall balance assessment: Needs assistance Sitting-balance support: Feet supported Sitting balance-Leahy Scale: Poor Sitting balance - Comments: able to sit briefly with min guard A, but cannot maintain, best after reaching to R due to L lateral and posterior lean   Standing balance support: Single extremity supported Standing balance-Leahy Scale: Zero Standing balance comment: dependent on assist                            Cognition Arousal/Alertness: Awake/alert Behavior During Therapy: Restless;Impulsive Overall Cognitive Status: Impaired/Different from baseline Area of Impairment: Attention;Following commands;Memory;Safety/judgement;Awareness;Problem solving;Rancho level               Rancho Levels of Cognitive Functioning Rancho Los Amigos Scales of Cognitive Functioning: Confused/inappropriate/non-agitated   Current Attention Level: Sustained Memory: Decreased short-term memory Following Commands: Follows one step commands with increased time;Follows one step commands inconsistently     Problem Solving: Slow processing;Decreased initiation;Requires verbal cues;Requires tactile cues;Difficulty sequencing General Comments: short commands with visual and tactile cues  Pertinent Vitals/Pain Pain Assessment: No/denies pain           PT Goals (current goals can now be found in the care plan section) Acute Rehab PT Goals PT Goal Formulation: With patient Time For Goal Achievement:  06/20/17 Potential to Achieve Goals: Fair Progress towards PT goals: Not progressing toward goals - comment(pt with difficulty participating in PT today)    Frequency    Min 2X/week      PT Plan Current plan remains appropriate       AM-PAC PT "6 Clicks" Daily Activity  Outcome Measure  Difficulty turning over in bed (including adjusting bedclothes, sheets and blankets)?: Unable Difficulty moving from lying on back to sitting on the side of the bed? : Unable Difficulty sitting down on and standing up from a chair with arms (e.g., wheelchair, bedside commode, etc,.)?: Unable Help needed moving to and from a bed to chair (including a wheelchair)?: Total Help needed walking in hospital room?: Total Help needed climbing 3-5 steps with a railing? : Total 6 Click Score: 6    End of Session Equipment Utilized During Treatment: Gait belt Activity Tolerance: Patient limited by fatigue Patient left: in bed;with call bell/phone within reach;with bed alarm set Nurse Communication: Mobility status PT Visit Diagnosis: Muscle weakness (generalized) (M62.81);History of falling (Z91.81);Difficulty in walking, not elsewhere classified (R26.2);Other symptoms and signs involving the nervous system (Z61.096(R29.898)     Time: [id number] PT Time Calculation (min) (ACUTE ONLY): 19 min  Charges:  $Therapeutic Activity: 8-22 mins                    G Codes:       Thekla Colborn B. Beverely RisenVan Fleet PT, DPT Acute Rehabilitation  [phone] Pager [phone]     Elon Alaslizabeth B Van Fleet 06/23/2017, 5:57 PM

## 2017-06-24 LAB — GLUCOSE, CAPILLARY
GLUCOSE-CAPILLARY: 76 mg/dL (ref 65–99)
Glucose-Capillary: 143 mg/dL — ABNORMAL HIGH (ref 65–99)
Glucose-Capillary: 88 mg/dL (ref 65–99)
Glucose-Capillary: 111 mg/dL — ABNORMAL HIGH (ref 65–99)

## 2017-06-24 MED ORDER — OLANZAPINE 5 MG PO TABS
5.0000 mg | ORAL_TABLET | Freq: Two times a day (BID) | ORAL | Status: DC | PRN
  Administered 2017-06-24 – 2017-07-07 (×16): 5 mg via ORAL
  Filled 2017-06-24 (×20): qty 1

## 2017-06-24 NOTE — Care Management Note (Signed)
Case Management Note  Patient Details  Name: Christopher ReichertManuel Shaffer MRN: [id number] Date of Birth: June 30, 1965  Subjective/Objective:                    Action/Plan: Patient continues on the DTP list. CM following.  Expected Discharge Date:  05/20/17               Expected Discharge Plan:  Skilled Nursing Facility  In-House Referral:  Clinical Social Work  Discharge planning Services  CM Consult  Post Acute Care Choice:  NA Choice offered to:  NA  DME Arranged:  N/A DME Agency:  NA  HH Arranged:  NA HH Agency:  NA  Status of Service:  In process, will continue to follow  If discussed at Long Length of Stay Meetings, dates discussed:    Additional Comments:  [name] F Salome Cozby, RN 06/24/2017, 1:13 PM

## 2017-06-24 NOTE — Progress Notes (Addendum)
Subjective: Patient reports "Good Morning, Sir"  Objective: Vital signs in last 24 hours: Temp:  [98.1 F (36.7 C)-99.3 F (37.4 C)] 99 F (37.2 C) (04/05 0739) Pulse Rate:  [106-116] 109 (04/05 0739) Resp:  [16-20] 20 (04/05 0739) BP: (127-153)/(86-98) 150/92 (04/05 0739) SpO2:  [97 %-99 %] 98 % (04/05 0739)  Intake/Output from previous day: 04/04 0701 - 04/05 0700 In: 240 [P.O.:240] Out: -  Intake/Output this shift: No intake/output data recorded.  Sitting up in bed (HOB elevated) eating breakfast as assisted by Aide. Readily responds verbally this am. Confusion persists (states, "I'm pretty bruised up"). Moves right side and left foot on command. No agitation/belligerent episodes overnight.  Lab Results: No results for input(s): WBC, HGB, HCT, PLT in the last 72 hours. BMET No results for input(s): NA, K, CL, CO2, GLUCOSE, BUN, CREATININE, CALCIUM in the last 72 hours.  Studies/Results: No results found.  Assessment/Plan: stable  LOS: 42 days  Appreciate psychiatry's input. Will stop Ativan and initiate Zyprexa, monitoring EKG for prolonged QT. Awaiting SNF placement.    Georgiann Cockeroteat, Brian 06/24/2017, 8:05 AM  Patient to start antipsychotic medications.

## 2017-06-24 NOTE — Progress Notes (Signed)
CSW continuing to follow for placement. Patient remains difficult to place; will need to pursue guardianship of patient. CSW will continue to follow.  Blenda NicelyElizabeth Rozanne Heumann, Kentucky[place]CS[place] Clinical Social Worker [phone]

## 2017-06-25 LAB — GLUCOSE, CAPILLARY
GLUCOSE-CAPILLARY: 90 mg/dL (ref 65–99)
GLUCOSE-CAPILLARY: 142 mg/dL — AB (ref 65–99)
GLUCOSE-CAPILLARY: 122 mg/dL — AB (ref 65–99)
GLUCOSE-CAPILLARY: 128 mg/dL — AB (ref 65–99)

## 2017-06-25 NOTE — Progress Notes (Signed)
NEUROSURGERY PROGRESS NOTE  Doing well.alert and sitting up  Temp:  [98.2 F (36.8 C)-99.1 F (37.3 C)] 99.1 F (37.3 C) (04/06 0800) Pulse Rate:  [97-115] 111 (04/06 1028) Resp:  [16-20] 19 (04/06 1028) BP: (107-157)/(73-94) 156/84 (04/06 0800) SpO2:  [98 %-99 %] 98 % (04/06 0800)  Plan: No nsgy recom  Sherryl MangesKimberly Hannah Amadou Katzenstein, NP 06/25/2017 10:49 AM

## 2017-06-26 LAB — GLUCOSE, CAPILLARY
GLUCOSE-CAPILLARY: 70 mg/dL (ref 65–99)
Glucose-Capillary: 91 mg/dL (ref 65–99)
Glucose-Capillary: 135 mg/dL — ABNORMAL HIGH (ref 65–99)
Glucose-Capillary: 151 mg/dL — ABNORMAL HIGH (ref 65–99)

## 2017-06-26 MED ORDER — STARCH (THICKENING) PO POWD: ORAL | Status: DC | PRN

## 2017-06-26 NOTE — Progress Notes (Signed)
Patient ID: Christopher ReichertManuel Shaffer, male   DOB: 02/18/1966, 52 y.o.   MRN: [id number] No change. Stable. continue current management

## 2017-06-27 LAB — GLUCOSE, CAPILLARY
GLUCOSE-CAPILLARY: 118 mg/dL — AB (ref 65–99)
GLUCOSE-CAPILLARY: 115 mg/dL — AB (ref 65–99)
Glucose-Capillary: 78 mg/dL (ref 65–99)
Glucose-Capillary: 88 mg/dL (ref 65–99)

## 2017-06-27 NOTE — Progress Notes (Signed)
Nutrition Follow-up  INTERVENTION:   - Continue Ensure Enlive po TID, each supplement provides 350 kcal and 20 grams of protein  - Encourage PO intake  NUTRITION DIAGNOSIS:   Inadequate oral intake related to dysphagia, lethargy/confusion as evidenced by meal completion < 50%.  Improved  GOAL:   Patient will meet greater than or equal to 90% of their needs  Improved  MONITOR:   PO intake, Supplement acceptance, Diet advancement, Weight trends  REASON FOR ASSESSMENT:   Rounds    ASSESSMENT:   Pt with PMH of ETOH abuse, previous TBI, AAA, CKD, HTN, with frequent visit to ER admitted 2/22 (found down with head injury) with R ICH, small SDH, skull fx, L hemiparesis, L neglect and loss of vision on the left.   Pt continues to await SNF placement.  Spoke with pt at bedside who requested that RD call the police. Pt with RN sitter at bedside. Pt states he's eating "pretty good." Lunch meal tray in room at time of RD visit. Pt with 0% completion of pureed food items and 100% completion of Magic Cup and pudding.  Meal Completion: 100% most meals  Medications reviewed and include: folic acid, sliding scale Novolog, Remeron, Protonix, Senokot-S, thiamine  Labs reviewed. CBG's: 118, 78, 151, 135 x 24 hours  Diet Order:  Fall precautions DIET - DYS 1 Room service appropriate? Yes; Fluid consistency: Nectar Thick  EDUCATION NEEDS:   No education needs have been identified at this time  Skin:  Skin Assessment: Reviewed RN Assessment Skin Integrity Issues:: Other (Comment) Other: non pressure wound to back  Last BM:  06/27/17  Height:   Ht Readings from Last 1 Encounters:  05/13/17 6' (1.829 m)    Weight:   Wt Readings from Last 1 Encounters:  06/02/17 184 lb 15.5 oz (83.9 kg)    Ideal Body Weight:  80.9 kg  BMI:  Body mass index is 25.09 kg/m.  Estimated Nutritional Needs:   Kcal:  2100-2300  Protein:  125-140 grams  Fluid:  >2.1 L/day    Christopher ReadingKate  Jablonski Jem Castro, MS, RD, LDN Pager: [phone] Weekend/After Hours: [phone]

## 2017-06-27 NOTE — Progress Notes (Signed)
CSW following for discharge plan. Patient remains difficult to place; no disposition at this time.  CSW will continue to follow.  Blenda NicelyElizabeth Lillah Standre, [place] Clinical Social Worker [phone]

## 2017-06-28 LAB — GLUCOSE, CAPILLARY
GLUCOSE-CAPILLARY: 86 mg/dL (ref 65–99)
GLUCOSE-CAPILLARY: 110 mg/dL — AB (ref 65–99)
GLUCOSE-CAPILLARY: 80 mg/dL (ref 65–99)
Glucose-Capillary: 95 mg/dL (ref 65–99)

## 2017-06-28 NOTE — Progress Notes (Signed)
Physical Therapy Treatment Patient Details Name: Carlyn ReichertManuel Macphail MRN: [id number] DOB: March 21, 1966 Today's Date: 06/28/2017    History of Present Illness Patient is a 52 yo male, chronic alcoholic admitted after being found face down with evidence of head injury.  CT of head showed acute depressed Rt fronto-temporal skull fx with frontotemporal confluent acute hemorrhagic contusions, small Lt frontal lobe contusion, 6mm Rt > Lt midline shift with Rt uncal herniation, Rt to Lt ventricular entrapments as well ass R-IVH, SAH, and B-SDH.   He was also found to have questionable of cortical avulsion fracture of dorsal aspect of the Lt olecranon possible at insertion of triceps tendon.  Other significant PMH includes ETOH, CKD, HTN, AAA, DM, and h/o prior traumatic SAH     PT Comments    Pt received in bed asleep. Flat affect. +2 max assist to transition to EOB. Initially requiring max assist to maintain sitting balance. He progressed to min assist. Pt sat EOB x 10 minutes, perseverating on lying back down the whole duration of the session. Pt declining attempts of standing. Pt returned to sidelying in bed at end of session with NT/sitter preparing to bathe pt in bed.    Follow Up Recommendations  SNF;Supervision/Assistance - 24 hour     Equipment Recommendations  Other (comment)(defer to next venue)    Recommendations for Other Services       Precautions / Restrictions Precautions Precautions: Fall;Other (comment) Precaution Comments: TBI Required Braces or Orthoses: Sling Restrictions Weight Bearing Restrictions: Yes LUE Weight Bearing: Non weight bearing Other Position/Activity Restrictions: Discussed with RN. No fx on imaging. Used conservative protocal and avoiding pushing/pulling with LUE    Mobility  Bed Mobility Overal bed mobility: Needs Assistance Bed Mobility: Supine to Sit;Sit to Sidelying     Supine to sit: Max assist;+2 for physical assistance   Sit to sidelying: Mod  assist;+2 for physical assistance General bed mobility comments: assist to bring BLE off bed and to elevate trunk. Pt able to lower trunk into sidelying for return to bed only needing physical assist for BLE.  Transfers                 General transfer comment: Pt declining standing today even with max encouragement. He was perseverating on lying down.  Ambulation/Gait             General Gait Details: unable   Stairs            Wheelchair Mobility    Modified Rankin (Stroke Patients Only)       Balance Overall balance assessment: Needs assistance Sitting-balance support: Feet supported;No upper extremity supported Sitting balance-Leahy Scale: Poor Sitting balance - Comments: initially max assist to maintain sitting balance EOB progressing to min assist for correctional cues. Pt sat EOB 10-12 minutes. Postural control: Posterior lean                                  Cognition Arousal/Alertness: Awake/alert Behavior During Therapy: Impulsive;Flat affect Overall Cognitive Status: Impaired/Different from baseline Area of Impairment: Attention;Following commands;Memory;Safety/judgement;Awareness;Problem solving;Rancho level;Orientation               Rancho Levels of Cognitive Functioning Rancho Los Amigos Scales of Cognitive Functioning: Confused/inappropriate/non-agitated Orientation Level: Disoriented to;Place;Time Current Attention Level: Sustained Memory: Decreased short-term memory Following Commands: Follows one step commands with increased time;Follows one step commands inconsistently Safety/Judgement: Decreased awareness of safety;Decreased awareness of deficits Awareness: Intellectual Problem  Solving: Slow processing;Decreased initiation;Requires verbal cues;Requires tactile cues;Difficulty sequencing General Comments: Pt perseverating on lying back in bed. difficulty maintaining attention to ADL tasks or mobility. Pt stating "Can  you just stop bothering me?" or "Can I lay back down?" Requiring verbal, visual, and tactile cues for sequencing tasks and mobility. During session, pt pointing to [facility] and stating "can you just play my guitar?":      Exercises General Exercises - Lower Extremity Short Arc Quad: PROM;Left;5 reps;Seated Long Arc Quad: AROM;Right;5 reps;Seated    General Comments General comments (skin integrity, edema, etc.): VSS      Pertinent Vitals/Pain Pain Assessment: Faces Faces Pain Scale: No hurt Pain Intervention(s): Monitored during session    Home Living                      Prior Function            PT Goals (current goals can now be found in the care plan section) Acute Rehab PT Goals Patient Stated Goal: none stated  PT Goal Formulation: Patient unable to participate in goal setting Time For Goal Achievement: 07/04/17 Potential to Achieve Goals: Fair Progress towards PT goals: Progressing toward goals    Frequency    Min 2X/week      PT Plan Current plan remains appropriate    Co-evaluation PT/OT/SLP Co-Evaluation/Treatment: Yes Reason for Co-Treatment: Complexity of the patient's impairments (multi-system involvement);For patient/therapist safety;To address functional/ADL transfers PT goals addressed during session: Mobility/safety with mobility;Balance OT goals addressed during session: ADL's and self-care      AM-PAC PT "6 Clicks" Daily Activity  Outcome Measure  Difficulty turning over in bed (including adjusting bedclothes, sheets and blankets)?: Unable Difficulty moving from lying on back to sitting on the side of the bed? : Unable Difficulty sitting down on and standing up from a chair with arms (e.g., wheelchair, bedside commode, etc,.)?: Unable Help needed moving to and from a bed to chair (including a wheelchair)?: Total Help needed walking in hospital room?: Total Help needed climbing 3-5 steps with a railing? : Total 6 Click Score: 6    End  of Session Equipment Utilized During Treatment: Gait belt Activity Tolerance: Patient tolerated treatment well Patient left: in bed;with call bell/phone within reach;with nursing/sitter in room Nurse Communication: Mobility status PT Visit Diagnosis: Muscle weakness (generalized) (M62.81);History of falling (Z91.81);Difficulty in walking, not elsewhere classified (R26.2);Other symptoms and signs involving the nervous system (R29.898)     Time: 7425-9563 PT Time Calculation (min) (ACUTE ONLY): 27 min  Charges:  $Therapeutic Activity: 8-22 mins                    G Codes:       Aida Raider, PT  Office # [phone] Pager [phone]    Ilda Foil 06/28/2017, 11:51 AM

## 2017-06-28 NOTE — Progress Notes (Signed)
Occupational Therapy Treatment Patient Details Name: [name] MRN: [id number] DOB: [birth year] Today's Date: 06/28/2017    History of present illness Patient is a 52 yo male, chronic alcoholic admitted after being found face down with evidence of head injury.  CT of head showed acute depressed Rt fronto-temporal skull fx with frontotemporal confluent acute hemorrhagic contusions, small Lt frontal lobe contusion, 6mm Rt > Lt midline shift with Rt uncal herniation, Rt to Lt ventricular entrapments as well ass R-IVH, SAH, and B-SDH.   He was also found to have questionable of cortical avulsion fracture of dorsal aspect of the Lt olecranon possible at insertion of triceps tendon.  Other significant PMH includes ETOH, CKD, HTN, AAA, DM, and h/o prior traumatic SAH    OT comments  Pt received sleeping, supine in bed with sitter at bedside. Pt requiring Mod-Max A +2 for bed mobility. Pt maintaining sitting at EOB for ~10-12 minutes with Min A for correcting upright posture. Pt requiring Max A for UB bathing while seated at EOB. Pt following simple, direct cues with increased time and cues. Pt perseverating on taking a nap and resting. Continue to recommend dc to SNF and will continue to follow acutely as admitted.   Follow Up Recommendations  SNF    Equipment Recommendations  Other (comment)(TBD in next venue )    Recommendations for Other Services Rehab consult    Precautions / Restrictions Precautions Precautions: Fall Precaution Comments: TBI Required Braces or Orthoses: Sling Restrictions Weight Bearing Restrictions: Yes LUE Weight Bearing: Non weight bearing Other Position/Activity Restrictions: Discussed with RN. No fx on imaging. Used conservative protocal and avoiding pushing/pulling with LUE       Mobility Bed Mobility Overal bed mobility: Needs Assistance Bed Mobility: Supine to Sit;Sit to Sidelying     Supine to sit: Max assist;+2 for physical assistance   Sit to  sidelying: Mod assist;+2 for physical assistance General bed mobility comments: Pt requring Max A to bring BLEs towards EOB. Use of bedpad to transition hips towards EOB. Max A +2 to elevate trunk. Pt requiring Mod A +2 to transition fro mEOB to sidelying and elevate BLEs over EOB.  Transfers                 General transfer comment: Pt requesting not to standing    Balance Overall balance assessment: Needs assistance Sitting-balance support: Feet supported Sitting balance-Leahy Scale: Poor Sitting balance - Comments: Requiring MIn A for correctional cues to maitnain upright psoterio Postural control: Posterior lean                                 ADL either performed or assessed with clinical judgement   ADL Overall ADL's : Needs assistance/impaired       Grooming Details (indicate cue type and reason): Pt declining groomign at EOB Upper Body Bathing: Maximal assistance;Sitting Upper Body Bathing Details (indicate cue type and reason): Pt requiring Max A for UB bathing while seated at EOB. For maintaining sitting at EOB, to requiring Min A for correctional cues with posterior lean.         Lower Body Dressing: Total assistance;Bed level Lower Body Dressing Details (indicate cue type and reason): Pt able to elevate RLE off bed for donning socks. Pt unable to sustain elevation of left heel off bed for donning left sock.               General ADL Comments:  Pt perseverating on laying back in bed and resting. Pt with decreased motivation to participate and requiring increased cues. Pt maintaining sittng at EOB with Min A +2 for 10-12 min.      Vision   Vision Assessment?: Vision impaired- to be further tested in functional context   Perception     Praxis      Cognition Arousal/Alertness: Awake/alert Behavior During Therapy: Impulsive;Flat affect Overall Cognitive Status: Impaired/Different from baseline Area of Impairment: Attention;Following  commands;Memory;Safety/judgement;Awareness;Problem solving;Rancho level               Rancho Levels of Cognitive Functioning Rancho Los Amigos Scales of Cognitive Functioning: Confused/inappropriate/non-agitated   Current Attention Level: Sustained Memory: Decreased short-term memory Following Commands: Follows one step commands with increased time;Follows one step commands inconsistently Safety/Judgement: Decreased awareness of safety;Decreased awareness of deficits Awareness: Intellectual Problem Solving: Slow processing;Decreased initiation;Requires verbal cues;Requires tactile cues;Difficulty sequencing General Comments: Pt perseverating on lying back in bed. difficulty maintaining attention to ADL tasks or mobility. Pt stating "Can you just stop bothering me?" or "Can I lay back down?" Requiring verbal, visual, and tactile cues for sequencing tasks and mobility. During session, [facility] and stating "can you just play my guitar?":        Exercises Exercises: Other exercises General Exercises - Lower Extremity Short Arc Quad: PROM;5 reps;Seated;AROM;Both(PROM LLE)   Shoulder Instructions       General Comments VSS    Pertinent Vitals/ Pain       Pain Assessment: Faces Faces Pain Scale: No hurt Pain Intervention(s): Monitored during session  Home Living                                          Prior Functioning/Environment              Frequency  Min 2X/week        Progress Toward Goals  OT Goals(current goals can now be found in the care plan section)  Progress towards OT goals: Progressing toward goals  Acute Rehab OT Goals Patient Stated Goal: none stated  OT Goal Formulation: Patient unable to participate in goal setting Time For Goal Achievement: 07/05/17 Potential to Achieve Goals: Fair ADL Goals Pt Will Perform Eating: with min assist;sitting Pt Will Perform Grooming: with min assist;sitting Pt Will Perform Upper  Body Bathing: with mod assist;sitting Pt Will Transfer to Toilet: with mod assist;with +2 assist;bedside commode Additional ADL Goal #1: Pt will sustain attention to simple ADL task x 4 mins with min cues Additional ADL Goal #2: Pt will demonstrates intellectual awareness of deficits  Plan Discharge plan remains appropriate    Co-evaluation    PT/OT/SLP Co-Evaluation/Treatment: Yes Reason for Co-Treatment: Complexity of the patient's impairments (multi-system involvement);To address functional/ADL transfers;For patient/therapist safety   OT goals addressed during session: ADL's and self-care      AM-PAC PT "6 Clicks" Daily Activity     Outcome Measure   Help from another person eating meals?: A Lot Help from another person taking care of personal grooming?: A Lot Help from another person toileting, which includes using toliet, bedpan, or urinal?: Total Help from another person bathing (including washing, rinsing, drying)?: A Lot Help from another person to put on and taking off regular upper body clothing?: Total Help from another person to put on and taking off regular lower body clothing?: Total 6 Click Score: 9  End of Session Equipment Utilized During Treatment: Gait belt  OT Visit Diagnosis: Unsteadiness on feet (R26.81);Cognitive communication deficit (R41.841) Symptoms and signs involving cognitive functions: (TBI)   Activity Tolerance Patient limited by fatigue   Patient Left in bed;with call bell/phone within reach;with bed alarm set;with nursing/sitter in room   Nurse Communication Mobility status        Time: 1038-1100 OT Time Calculation (min): 22 min  Charges: OT General Charges $OT Visit: 1 Visit OT Treatments $Self Care/Home Management : 8-22 mins  Shameer Molstad MSOT, OTR/L Acute Rehab Pager: [phone] Office: [phone]   Theodoro Grist Starletta Houchin 06/28/2017, 11:15 AM

## 2017-06-28 NOTE — Progress Notes (Signed)
  Speech Language Pathology Treatment: Dysphagia  Patient Details Name: Christopher ReichertManuel Shaffer MRN: [id number] DOB: 1965-12-27 Today's Date: 06/28/2017 Time: [id number] SLP Time Calculation (min) (ACUTE ONLY): 11 min  Assessment / Plan / Recommendation Clinical Impression  Pt was seen for skilled ST targeting dysphagia goals.  Pt yelling out as therapist was entering the room.  Pt pleading with therapist to help him leave so he could go to the gas station and get a beer.  Pt unable to be redirected.  Pt talking in both [place] and [place].  SLP completed oral care prior to administration of ice chips.  Pt consumed 2-3 ice chips without overt s/s of aspiration but was too distractible and restless for further trials.  Recommend that pt remain on his currently prescribed diet with trials of ice chips following oral care.    HPI HPI: Patient is a 52 yo male, chronic alcoholic with right ICH, small SDH and skull fracture with right to left shift increased from 6 to 8mm per CT 05/14/17. Thrombocytopenia with likely poorly functioning platelets. Left hemiparesis with left neglect and loss of vision on left.      SLP Plan  Continue with current plan of care       Recommendations  Diet recommendations: Dysphagia 1 (puree);Nectar-thick liquid Liquids provided via: Cup Medication Administration: Crushed with puree Supervision: Staff to assist with self feeding;Full supervision/cueing for compensatory strategies Compensations: Small sips/bites;Minimize environmental distractions;Slow rate;Chin tuck Postural Changes and/or Swallow Maneuvers: Seated upright 90 degrees;Upright 30-60 min after meal                Oral Care Recommendations: Oral care BID Follow up Recommendations: Skilled Nursing facility;24 hour supervision/assistance SLP Visit Diagnosis: Cognitive communication deficit (R41.841);Dysphagia, oropharyngeal phase (R13.12) Plan: Continue with current plan of care       GO                PageMelanee Spry, Birtha Hatler L 06/28/2017, 4:24 PM

## 2017-06-28 NOTE — Care Management Note (Signed)
Case Management Note  Patient Details  Name: Carlyn ReichertManuel Minotti MRN: [id number] Date of Birth: 1966/02/05  Subjective/Objective:                    Action/Plan: Pt is on the DTP list with CSW. CM following.  Expected Discharge Date:  05/20/17               Expected Discharge Plan:  Skilled Nursing Facility  In-House Referral:  Clinical Social Work  Discharge planning Services  CM Consult  Post Acute Care Choice:  NA Choice offered to:  NA  DME Arranged:  N/A DME Agency:  NA  HH Arranged:  NA HH Agency:  NA  Status of Service:  In process, will continue to follow  If discussed at Long Length of Stay Meetings, dates discussed:    Additional Comments:  [name] F Kodi Guerrera, RN 06/28/2017, 10:54 AM

## 2017-06-29 LAB — GLUCOSE, CAPILLARY
GLUCOSE-CAPILLARY: 163 mg/dL — AB (ref 65–99)
GLUCOSE-CAPILLARY: 188 mg/dL — AB (ref 65–99)
GLUCOSE-CAPILLARY: 143 mg/dL — AB (ref 65–99)
Glucose-Capillary: 125 mg/dL — ABNORMAL HIGH (ref 65–99)

## 2017-06-30 LAB — GLUCOSE, CAPILLARY
GLUCOSE-CAPILLARY: 139 mg/dL — AB (ref 65–99)
Glucose-Capillary: 101 mg/dL — ABNORMAL HIGH (ref 65–99)
Glucose-Capillary: 88 mg/dL (ref 65–99)
Glucose-Capillary: 86 mg/dL (ref 65–99)

## 2017-06-30 NOTE — Progress Notes (Signed)
Staff response to pt's bed alarm and noted pt on the floor with Dr. Roda ShuttersXu at this time. VSS. Slight skin scrape of left knee and right shin at this time. Pt denied pain and appears in no distress. Placed enclosed bed at this time. MD called. Awaiting for call back.   [name]. RN

## 2017-06-30 NOTE — Progress Notes (Signed)
RN spoke to PA of neurosurgery team and personnel ok to order pt a enclosed bed at this time for safety since no sitter available.   Sim BoastHavy, RN

## 2017-07-01 LAB — GLUCOSE, CAPILLARY
GLUCOSE-CAPILLARY: 100 mg/dL — AB (ref 65–99)
GLUCOSE-CAPILLARY: 141 mg/dL — AB (ref 65–99)

## 2017-07-01 NOTE — Progress Notes (Signed)
  Speech Language Pathology Treatment: Cognitive-Linquistic  Patient Details Name: Carlyn ReichertManuel Freeman MRN: [id number] DOB: 1965/09/14 Today's Date: 07/01/2017 Time: [id number] SLP Time Calculation (min) (ACUTE ONLY): 15 min  Assessment / Plan / Recommendation Clinical Impression  Pt was seen for cognitive-linguistic treatment.  Alert, confabulatory today, requiring max cues for focus and redirection, max cues to repeat question to assist with focusing on accurate response.  Oriented to person only; recalled 2 orientation facts after 30 second delay.  Distracted by both external and internal stimuli.  Declined POs today.  Continue towards plan of care.   HPI HPI: Patient is a 52 yo male, chronic alcoholic with right ICH, small SDH and skull fracture with right to left shift increased from 6 to 8mm per CT 05/14/17. Thrombocytopenia with likely poorly functioning platelets. Left hemiparesis with left neglect and loss of vision on left.      SLP Plan  Continue with current plan of care       Recommendations                   Plan: Continue with current plan of care       GO                Carolan ShiverCouture, Sheli Dorin Laurice 07/01/2017, 3:25 PM

## 2017-07-01 NOTE — Progress Notes (Signed)
Pt refused AM and afternoon CBG and vitals. MD notified.

## 2017-07-01 NOTE — Progress Notes (Signed)
Physical Therapy Treatment Patient Details Name: [name] MRN: [id number] DOB: 11-Apr-1965 Today's Date: 07/01/2017    History of Present Illness Patient is a 52 yo male, chronic alcoholic admitted after being found face down with evidence of head injury.  CT of head showed acute depressed Rt fronto-temporal skull fx with frontotemporal confluent acute hemorrhagic contusions, small Lt frontal lobe contusion, 6mm Rt > Lt midline shift with Rt uncal herniation, Rt to Lt ventricular entrapments as well ass R-IVH, SAH, and B-SDH.   He was also found to have questionable of cortical avulsion fracture of dorsal aspect of the Lt olecranon possible at insertion of triceps tendon.  Other significant PMH includes ETOH, CKD, HTN, AAA, DM, and h/o prior traumatic SAH     PT Comments    Patient remains impaired with postural and spatial awareness deficits.  He has excessive LE tone in sitting and standing with bilateral flexor withdrawal at one point when attempting to stand with sara plus.  He needs more upright/OOB time for progress with upright orientation, but not safe for unsupervised sitting.  PT to continue to follow towards updated goals and continues to require SNF level rehab at d/c.   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 Restrictions Weight Bearing Restrictions: No LUE Weight Bearing: Non weight bearing    Mobility  Bed Mobility Overal bed mobility: Needs Assistance Bed Mobility: Supine to Sit;Sit to Supine     Supine to sit: Max assist;+2 for physical assistance Sit to supine: +2 for physical assistance;Total assist   General bed mobility comments: assist to bring BLE off bed and to elevate trunk with manual facilitation for positioning;  total assist to guide UB and LEs onto EOB when returning to supine   Transfers Overall transfer level: Needs assistance Equipment used: Ambulation equipment used Transfers: Sit to/from Stand Sit to Stand: Total assist;+2 physical assistance;+2 safety/equipment         General transfer comment: use of SaraPlus for sit<>stand; pt requiring maxA for placement of LEs onto lift equipment and to maintain proper positioning of LEs for safe transfer completion, one instance of pt lifting BLEs from footplate when elevated in standing requiring return to sitting for repositioning prior to transfer with use of equipment; use of equipment for transfer EOB<>BSC   Ambulation/Gait                 Stairs             Wheelchair Mobility    Modified Rankin (Stroke Patients Only)       Balance Overall balance assessment: Needs assistance Sitting-balance support: Feet supported;No upper extremity supported;Single extremity supported Sitting balance-Leahy Scale: Poor Sitting balance - Comments: overall mod-maxA for static sitting balance EOB; brief periods of minA-MinGuard Postural control: Posterior lean;Right lateral lean Standing balance support: Single extremity supported Standing balance-Leahy Scale: Zero                              Cognition Arousal/Alertness: Awake/alert Behavior During Therapy: Impulsive;Anxious Overall Cognitive Status: Impaired/Different from baseline Area of Impairment: Attention;Following commands;Memory;Safety/judgement;Awareness;Problem solving;Rancho level;Orientation               Rancho Levels of Cognitive Functioning Rancho Los Amigos Scales of Cognitive Functioning: Confused/agitated  Orientation Level: Disoriented to;Place;Time;Situation Current Attention Level: Sustained Memory: Decreased short-term memory Following Commands: Follows one step commands with increased time;Follows one step commands  inconsistently Safety/Judgement: Decreased awareness of safety;Decreased awareness of deficits Awareness: Intellectual Problem Solving: Slow processing;Decreased initiation;Requires verbal cues;Requires tactile cues;Difficulty sequencing General Comments: During first part of session pt is able to [facility], that he fell and hit his head; when orienting to time pt able to state "April" when given the cue that it was the 4th month of the year; later in session when asking orientation questions pt stating [place] [employer]; pt inconsistently follows one step commands, requires max multimodal cues to do so       Exercises      General Comments General comments (skin integrity, edema, etc.): RN reported pt had been stooling a lot today and even got it smeared in vail bed, reported cleaned, but new bed up later this pm.  Left in brief with mitt on R hand end of session.      Pertinent Vitals/Pain Pain Assessment: Faces Faces Pain Scale: Hurts even more Pain Location: R elbow, neck  Pain Descriptors / Indicators: Guarding;Aching Pain Intervention(s): Repositioned    Home Living                      Prior Function            PT Goals (current goals can now be found in the care plan section) Acute Rehab PT Goals Patient Stated Goal: none stated  PT Goal Formulation: Patient unable to participate in goal setting Time For Goal Achievement: 07/15/17 Potential to Achieve Goals: Fair Progress towards PT goals: Progressing toward goals    Frequency    Min 2X/week      PT Plan Current plan remains appropriate    Co-evaluation PT/OT/SLP Co-Evaluation/Treatment: Yes Reason for Co-Treatment: Complexity of the patient's impairments (multi-system involvement);For patient/therapist safety;Necessary to address cognition/behavior during functional activity;To address functional/ADL transfers PT goals addressed during session:  Mobility/safety with mobility;Balance OT goals addressed during session: ADL's and self-care      AM-PAC PT "6 Clicks" Daily Activity  Outcome Measure  Difficulty turning over in bed (including adjusting bedclothes, sheets and blankets)?: Unable Difficulty moving from lying on back to sitting on the side of the bed? : Unable Difficulty sitting down on and standing up from a chair with arms (e.g., wheelchair, bedside commode, etc,.)?: Unable Help needed moving to and from a bed to chair (including a wheelchair)?: Total Help needed walking in hospital room?: Total Help needed climbing 3-5 steps with a railing? : Total 6 Click Score: 6    End of Session   Activity Tolerance: Patient tolerated treatment well Patient left: with call bell/phone within reach;in bed;with restraints reapplied;Other (comment)(zippers secured on vail bed) Nurse Communication: Mobility status PT Visit Diagnosis: Other abnormalities of gait and mobility (R26.89);Other symptoms and signs involving the nervous system (R29.898);Hemiplegia and hemiparesis Hemiplegia - Right/Left: Left Hemiplegia - dominant/non-dominant: Non-dominant Hemiplegia - caused by: Unspecified     Time: [id number] PT Time Calculation (min) (ACUTE ONLY): 41 min  Charges:  $Therapeutic Activity: 23-37 mins                    G CodesSheran Lawless:       Cyndi Dandrea W[place]son, [place] CarolinaPT [phone] 07/01/2017    Elray Mcgregorynthia Jinger [place] 07/01/2017, 5:14 PM

## 2017-07-01 NOTE — Progress Notes (Signed)
Occupational Therapy Treatment Patient Details Name: [name] MRN: [id number] DOB: 1965/12/26 Today's Date: 07/01/2017    History of present illness Patient is a 52 yo male, chronic alcoholic admitted after being found face down with evidence of head injury.  CT of head showed acute depressed Rt fronto-temporal skull fx with frontotemporal confluent acute hemorrhagic contusions, small Lt frontal lobe contusion, 6mm Rt > Lt midline shift with Rt uncal herniation, Rt to Lt ventricular entrapments as well ass R-IVH, SAH, and B-SDH.   He was also found to have questionable of cortical avulsion fracture of dorsal aspect of the Lt olecranon possible at insertion of triceps tendon.  Other significant PMH includes ETOH, CKD, HTN, AAA, DM, and h/o prior traumatic SAH    OT comments  Pt progressing towards OT goals; continues to require maxA+2 for bed mobility and overall mod-maxA to maintain static sitting EOB this session, sitting EOB approx . Use of SaraPlus for transfer to/from [facility] with totalA +2, requiring increased time and max cues/maxA for LE placement on lift equipment to facilitate safe transfer. Pt following one step commands intermittently, requiring increased time and fluctuating between min-max multimodal cues to do so. Pt overall requiring max-totalA for UB/LB ADLs. Feel SNF recommendation remains appropriate. Will continue to follow acutely to progress pt towards established OT goals.   Follow Up Recommendations  SNF    Equipment Recommendations  Other (comment)(TBD in next venue )          Precautions / Restrictions Precautions Precautions: Fall;Other (comment) Precaution Comments: TBI Restrictions Weight Bearing Restrictions: No LUE Weight Bearing: Non weight bearing       Mobility Bed Mobility Overal bed mobility: Needs Assistance Bed Mobility: Supine to Sit;Sit to Supine     Supine to sit: Max assist;+2 for physical assistance Sit to supine: +2 for physical  assistance;Total assist   General bed mobility comments: assist to bring BLE off bed and to elevate trunk with manual facilitation for positioning; total assist to guide UB and LEs onto EOB when returning to supine   Transfers Overall transfer level: Needs assistance Equipment used: Ambulation equipment used Transfers: Sit to/from Stand Sit to Stand: Total assist;+2 physical assistance;+2 safety/equipment         General transfer comment: use of SaraPlus for sit<>stand; pt requiring maxA for placement of LEs onto lift equipment and to maintain proper positioning of LEs for safe transfer completion, one instance of pt lifting BLEs from footplate when elevated in standing requiring return to sitting for repositioning prior to transfer with use of equipment; use of equipment for transfer EOB<>BSC     Balance Overall balance assessment: Needs assistance Sitting-balance support: Feet supported;No upper extremity supported;Single extremity supported Sitting balance-Leahy Scale: Poor Sitting balance - Comments: overall mod-maxA for static sitting balance EOB; brief periods of minA-MinGuard Postural control: Posterior lean;Right lateral lean Standing balance support: Single extremity supported Standing balance-Leahy Scale: Zero                             ADL either performed or assessed with clinical judgement   ADL Overall ADL's : Needs assistance/impaired                     Lower Body Dressing: Total assistance;Bed level Lower Body Dressing Details (indicate cue type and reason): total assist to don socks; pt following commands to move LE towards therapist to assist  Toilet Transfer: Total assistance;+2 for physical assistance;+2  for safety/equipment Toilet Transfer Details (indicate cue type and reason): total assist+2 with use of SaraPlus to [facility] Toileting- Clothing Manipulation and Hygiene: Total assistance;Sit to/from stand;+2 for physical  assistance;+2 for safety/equipment Toileting - Clothing Manipulation Details (indicate cue type and reason): totalA to don new brief seated EOB, use of SaraPlus during sit<>stand for brief placement      Functional mobility during ADLs: Maximal assistance;+2 for physical assistance;+2 for safety/equipment General ADL Comments: pt sat EOB approx 8min with overall mod-maxA, brief episodes of minguard; use of SaraPlus for [facility] with totalA +2               Cognition Arousal/Alertness: Awake/alert Behavior During Therapy: Impulsive;Flat affect Overall Cognitive Status: Impaired/Different from baseline Area of Impairment: Attention;Following commands;Memory;Safety/judgement;Awareness;Problem solving;Rancho level;Orientation               Rancho Levels of Cognitive Functioning Rancho Los Amigos Scales of Cognitive Functioning: Confused/inappropriate/non-agitated Orientation Level: Disoriented to;Place;Time;Situation Current Attention Level: Sustained Memory: Decreased short-term memory Following Commands: Follows one step commands with increased time;Follows one step commands inconsistently Safety/Judgement: Decreased awareness of safety;Decreased awareness of deficits Awareness: Intellectual Problem Solving: Slow processing;Decreased initiation;Requires verbal cues;Requires tactile cues;Difficulty sequencing General Comments: During first part of session pt is able to [facility], that he fell and hit his head; when orienting to time pt able to state "April" when given the cue that it was the 4th month of the year; later in session when asking orientation questions pt stating [place] is [place] [employer] [place]; pt inconsistently follows one step commands, requires max multimodal cues to do so                           Pertinent Vitals/ Pain       Pain Assessment: Faces Faces Pain Scale: Hurts even more Pain Location: R elbow, neck   Pain Descriptors / Indicators: Guarding;Aching Pain Intervention(s): Monitored during session                                                          Frequency  Min 2X/week        Progress Toward Goals  OT Goals(current goals can now be found in the care plan section)  Progress towards OT goals: Progressing toward goals  Acute Rehab OT Goals Patient Stated Goal: none stated  OT Goal Formulation: Patient unable to participate in goal setting Time For Goal Achievement: 07/05/17 Potential to Achieve Goals: Fair  Plan Discharge plan remains appropriate    Co-evaluation    PT/OT/SLP Co-Evaluation/Treatment: Yes Reason for Co-Treatment: Complexity of the patient's impairments (multi-system involvement);Necessary to address cognition/behavior during functional activity;For patient/therapist safety;To address functional/ADL transfers   OT goals addressed during session: ADL's and self-care      AM-PAC PT "6 Clicks" Daily Activity     Outcome Measure   Help from another person eating meals?: A Lot Help from another person taking care of personal grooming?: A Lot Help from another person toileting, which includes using toliet, bedpan, or urinal?: Total Help from another person bathing (including washing, rinsing, drying)?: A Lot Help from another person to put on and taking off regular upper body clothing?: Total Help from another person to  put on and taking off regular lower body clothing?: Total 6 Click Score: 9    End of Session Equipment Utilized During Treatment: Other (comment)(SaraPlus)  OT Visit Diagnosis: Unsteadiness on feet (R26.81);Cognitive communication deficit (R41.841) Symptoms and signs involving cognitive functions: (TBI)   Activity Tolerance Treatment limited secondary to agitation;Other (comment)(limited due to cognitive deficits )   Patient Left in bed;with call bell/phone within reach;with bed alarm set(enclosure bed )    Nurse Communication Mobility status        Time: 1610-9604 OT Time Calculation (min): 41 min  Charges: OT General Charges $OT Visit: 1 Visit OT Treatments $Self Care/Home Management : 8-22 mins  Marcy Siren, OT Pager [phone] 07/01/2017    [name] 07/01/2017, 4:15 PM

## 2017-07-01 NOTE — Progress Notes (Signed)
CSW following for discharge planning. Patient remains difficult to place as hospital pursues guardianship over the patient. Per chart review, patient is not in an enclosed bed due to high fall risk.  CSW will continue to follow.  Blenda NicelyElizabeth Macario Shear, [place]CS[place] Clinical Social Worker [phone]

## 2017-07-02 LAB — GLUCOSE, CAPILLARY
GLUCOSE-CAPILLARY: 98 mg/dL (ref 65–99)
GLUCOSE-CAPILLARY: 94 mg/dL (ref 65–99)
Glucose-Capillary: 93 mg/dL (ref 65–99)
Glucose-Capillary: 109 mg/dL — ABNORMAL HIGH (ref 65–99)

## 2017-07-02 NOTE — Progress Notes (Signed)
Neurosurgery Progress Note  No issues overnight.  EXAM:  BP 117/70   Pulse 75   Temp 98.3 F (36.8 C) (Oral)   Resp 20   Ht 6' (1.829 m)   Wt 83.9 kg (184 lb 15.5 oz)   SpO2 100%   BMI 25.09 kg/m   Sleeping comfortably. Awakens to voice. Does not follow commands regularly  PLAN Stable this am No new recs. Continue current care

## 2017-07-03 LAB — GLUCOSE, CAPILLARY
Glucose-Capillary: 71 mg/dL (ref 65–99)
Glucose-Capillary: 107 mg/dL — ABNORMAL HIGH (ref 65–99)
Glucose-Capillary: 111 mg/dL — ABNORMAL HIGH (ref 65–99)
Glucose-Capillary: 161 mg/dL — ABNORMAL HIGH (ref 65–99)

## 2017-07-03 NOTE — Progress Notes (Signed)
Neurosurgery Progress Note  No issues overnight.  Complains of being cold this am, but otherwise no concerns  EXAM:  BP 132/88   Pulse 85   Temp 98.1 F (36.7 C) (Oral)   Resp 18   Ht 6' (1.829 m)   Wt 83.9 kg (184 lb 15.5 oz)   SpO2 100%   BMI 25.09 kg/m   Awake, alert Oriented to name and location MAEW  PLAN Doing well this am No new recs Continue to work on placement

## 2017-07-04 LAB — GLUCOSE, CAPILLARY
GLUCOSE-CAPILLARY: 73 mg/dL (ref 65–99)
GLUCOSE-CAPILLARY: 139 mg/dL — AB (ref 65–99)
Glucose-Capillary: 115 mg/dL — ABNORMAL HIGH (ref 65–99)
Glucose-Capillary: 91 mg/dL (ref 65–99)

## 2017-07-04 NOTE — Progress Notes (Signed)
Requested for enclosed bed order renewal. MD notified of need.

## 2017-07-04 NOTE — Progress Notes (Signed)
Nutrition Follow-up  DOCUMENTATION CODES:   Not applicable  INTERVENTION:  Continue Ensure Enlive po TID, each supplement provides 350 kcal and 20 grams of protein  Recommend obtain new weight  NUTRITION DIAGNOSIS:   Inadequate oral intake related to dysphagia, lethargy/confusion as evidenced by meal completion < 50%. -resolved  GOAL:   Patient will meet greater than or equal to 90% of their needs -met  MONITOR:   PO intake, Supplement acceptance, Diet advancement, Weight trends  ASSESSMENT:   Pt with PMH of ETOH abuse, previous TBI, AAA, CKD, HTN, with frequent visit to ER admitted 2/22 (found down with head injury) with R ICH, small SDH, skull fx, L hemiparesis, L neglect and loss of vision on the left.   Continues to consume 75-100% at all meals and drinking ensure TID. No complaints. Disp: Difficult SNF placement  Labs reviewed:  Na 132, K+ 3.4  Medications reviewed and include:  Folic Acid, Insulin, Remeron, Senokot-S, Thiamine  Diet Order:  Fall precautions DIET - DYS 1 Room service appropriate? Yes; Fluid consistency: Nectar Thick  EDUCATION NEEDS:   No education needs have been identified at this time  Skin:  Skin Assessment: Reviewed RN Assessment Skin Integrity Issues:: Other (Comment) Other: non pressure wound to back  Last BM:  06/27/17  Height:   Ht Readings from Last 1 Encounters:  05/13/17 6' (1.829 m)    Weight:   Wt Readings from Last 1 Encounters:  06/02/17 184 lb 15.5 oz (83.9 kg)    Ideal Body Weight:  80.9 kg  BMI:  Body mass index is 25.09 kg/m.  Estimated Nutritional Needs:   Kcal:  2100-2300  Protein:  125-140 grams  Fluid:  >2.1 L/day  Satira Anis. Shonda Mandarino, MS, RD LDN Inpatient Clinical Dietitian Pager [phone]

## 2017-07-04 NOTE — Progress Notes (Signed)
Pt was given Ensure Enlive shake for CBG of 73.

## 2017-07-04 NOTE — Clinical Social Work Note (Signed)
Still continuing to work on getting guardianship.   BertramBridget Johnell Bas, [place]WA [phone]

## 2017-07-04 NOTE — Progress Notes (Addendum)
Subjective: Patient reports "I'm so-so" "My elbow hurts"  Objective: Vital signs in last 24 hours: Temp:  [98.1 F (36.7 C)-98.6 F (37 C)] 98.3 F (36.8 C) (04/15 0000) Pulse Rate:  [82-96] 96 (04/15 0000) Resp:  [17-20] 17 (04/15 0000) BP: (106-136)/(65-82) 106/65 (04/15 0000) SpO2:  [97 %-100 %] 97 % (04/15 0000)  Intake/Output from previous day: 04/14 0701 - 04/15 0700 In: 240 [P.O.:240] Out: -  Intake/Output this shift: No intake/output data recorded.  Awakens to voice. Responding to questions and cooperting with exam, but requires repetition. Remains unable to make decisions for personal care/safety. Left side paresis. Now utilizing Posey Bed System since a fall last week. No bed sores. Right elbow with small amount fluid-swelling, but no erythema. Will monitor.  Lab Results: No results for input(s): WBC, HGB, HCT, PLT in the last 72 hours. BMET No results for input(s): NA, K, CL, CO2, GLUCOSE, BUN, CREATININE, CALCIUM in the last 72 hours.  Studies/Results: No results found.  Assessment/Plan: Stable, requiring max assist  LOS: 52 days  Awaits SNF placement.    Georgiann Cockeroteat, Brian 07/04/2017, 8:03 AM   Awaiting SNF placement.

## 2017-07-04 NOTE — Care Management Note (Signed)
Case Management Note  Patient Details  Name: Christopher ReichertManuel Shaffer MRN: [id number] Date of Birth: May 26, 1965  Subjective/Objective:                    Action/Plan: Pt continues on the difficult to place list for SNF. CM following.   Expected Discharge Date:  05/20/17               Expected Discharge Plan:  Skilled Nursing Facility  In-House Referral:  Clinical Social Work  Discharge planning Services  CM Consult  Post Acute Care Choice:  NA Choice offered to:  NA  DME Arranged:  N/A DME Agency:  NA  HH Arranged:  NA HH Agency:  NA  Status of Service:  In process, will continue to follow  If discussed at Long Length of Stay Meetings, dates discussed:    Additional Comments:  [name] F Carlynn Leduc, RN 07/04/2017, 9:08 AM

## 2017-07-05 LAB — GLUCOSE, CAPILLARY
GLUCOSE-CAPILLARY: 76 mg/dL (ref 65–99)
Glucose-Capillary: 119 mg/dL — ABNORMAL HIGH (ref 65–99)
Glucose-Capillary: 140 mg/dL — ABNORMAL HIGH (ref 65–99)
Glucose-Capillary: 74 mg/dL (ref 65–99)

## 2017-07-05 NOTE — Progress Notes (Signed)
Physical Therapy Treatment Patient Details Name: [name] MRN: [id number] DOB: 1965-08-03 Today's Date: 07/05/2017    History of Present Illness Patient is a 52 yo male, chronic alcoholic admitted after being found face down with evidence of head injury.  CT of head showed acute depressed Rt fronto-temporal skull fx with frontotemporal confluent acute hemorrhagic contusions, small Lt frontal lobe contusion, 6mm Rt > Lt midline shift with Rt uncal herniation, Rt to Lt ventricular entrapments as well ass R-IVH, SAH, and B-SDH.   He was also found to have questionable of cortical avulsion fracture of dorsal aspect of the Lt olecranon possible at insertion of triceps tendon.  Other significant PMH includes ETOH, CKD, HTN, AAA, DM, and h/o prior traumatic SAH     PT Comments    Patient finally able to sit up OOB in tilt in space w/c borrowed from inpatient rehab.  Patient with much improved sitting posture this session without leaning over while up on BSC x 2.  Also able to work on cognition near gift shop to notice large stuffed animals, though still not able to name them.  Patient appropriately asking to use toilet and with large formed stool while up on BSC.  Used [facility] Dec Plus for initial transfer, then able to do stand pivots with +2 max A no lift equipment and pt initiating standing x 1 of 3 trials.  Still beginning to lean to R in w/c end of session, but able to remain up with nursing aware with alarm belt in place.  Continues to need SNF level rehab at d/c.     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 Restrictions LUE Weight Bearing: Non weight bearing    Mobility  Bed Mobility Overal bed mobility: Needs Assistance Bed Mobility: Supine to Sit      Supine to sit: Max assist;+2 for physical assistance     General bed mobility comments: assist for legs off bed and lifting trunk despite cues due to pt with only focused attention and perseverates on getting to toilet  Transfers Overall transfer level: Needs assistance Equipment used: Ambulation equipment used;2 person hand held assist Transfers: Sit to/from Stand;Stand Pivot Transfers Sit to Stand: Max assist;+2 physical assistance Stand pivot transfers: Max assist;+2 physical assistance       General transfer comment: initally utililzed SaraPlus for transfer, required assist to boost buttocks and extend hips.  Second transfer from w/c <> BSC, pt required max A +2 for partial sit stand - unable to fully extend hips.  He requires assist for anterior translation of trunk and to boost buttocks as well as assist to pivot LEs   Ambulation/Gait                 Psychologist, counselling mobility: Yes Wheelchair parts: Needs assistance Distance: 500 Wheelchair Assistance Details (indicate cue type and reason): in tilt in space w/c pushed by staff off unit for sensory stimulation  Modified Rankin (Stroke Patients Only)       Balance Overall balance assessment: Needs assistance Sitting-balance support: Feet supported;No upper extremity supported;Single extremity supported Sitting balance-Leahy Scale: Poor Sitting balance - Comments: Pt demonstrates posterior pelvic tilt with thoracic flexion and demonstrates a posterior and Left  bias.   He requires mod - max a to maintain EOB sitting  Postural control: Posterior lean Standing balance support: Single extremity supported Standing balance-Leahy Scale: Poor Standing balance comment: Pt requires max A +2 to maintain standing - facilitation provided at hips for extension                             Cognition Arousal/Alertness: Awake/alert Behavior During Therapy:  Restless;Impulsive Overall Cognitive Status: Impaired/Different from baseline Area of Impairment: Attention;Memory;Following commands;Safety/judgement;Awareness;Problem solving;Orientation               Rancho Levels of Cognitive Functioning Rancho Los Amigos Scales of Cognitive Functioning: Confused/agitated Orientation Level: Place;Time;Situation Current Attention Level: Focused;Sustained Memory: Decreased short-term memory Following Commands: Follows one step commands with increased time;Follows one step commands inconsistently Safety/Judgement: Decreased awareness of deficits;Decreased awareness of safety   Problem Solving: Slow processing;Decreased initiation;Difficulty sequencing;Requires verbal cues;Requires tactile cues General Comments: Pt is tangential, and confabulatory.   He demonstrates focused attention with brief periods of sustained attention - seconds.  He has no awareness of deficits.  He is easily internally and externally distracted       Exercises      General Comments General comments (skin integrity, edema, etc.): left sitting in tilt in space w/c with alarm belt; RN aware of use of Sara Plus in room for back to bed      Pertinent Vitals/Pain Pain Assessment: No/denies pain Faces Pain Scale: Hurts a little bit Pain Location: generalized  Pain Descriptors / Indicators: Grimacing;Guarding Pain Intervention(s): Monitored during session;Repositioned    Home Living                      Prior Function            PT Goals (current goals can now be found in the care plan section) Progress towards PT goals: Progressing toward goals    Frequency    Min 2X/week      PT Plan Current plan remains appropriate    Co-evaluation PT/OT/SLP Co-Evaluation/Treatment: Yes Reason for Co-Treatment: Complexity of the patient's impairments (multi-system involvement);For patient/therapist safety;Necessary to address cognition/behavior during functional  activity PT goals addressed during session: Mobility/safety with mobility;Balance;Proper use of DME OT goals addressed during session: ADL's and self-care      AM-PAC PT "6 Clicks" Daily Activity  Outcome Measure  Difficulty turning over in bed (including adjusting bedclothes, sheets and blankets)?: Unable Difficulty moving from lying on back to sitting on the side of the bed? : Unable Difficulty sitting down on and standing up from a chair with arms (e.g., wheelchair, bedside commode, etc,.)?: Unable Help needed moving to and from a bed to chair (including a wheelchair)?: Total Help needed walking in hospital room?: Total Help needed climbing 3-5 steps with a railing? : Total 6 Click Score: 6    End of Session Equipment Utilized During Treatment: Gait belt Activity Tolerance: Patient tolerated treatment well Patient left: with call bell/phone within reach;with restraints reapplied;Other (comment);in chair(alarm belt in place) Nurse Communication: Mobility status PT Visit Diagnosis: Other abnormalities of gait and mobility (R26.89);Other symptoms and signs involving the nervous system (R29.898);Hemiplegia and hemiparesis Hemiplegia - Right/Left: Left Hemiplegia - dominant/non-dominant: Non-dominant Hemiplegia - caused by: Unspecified     Time: 1610-9604 PT Time Calculation (min) (ACUTE ONLY): 92 min  Charges:  $Therapeutic Activity: 23-37 mins $Neuromuscular Re-education: 23-37 mins  G CodesSheran Lawless:       Cyndi Elis Rawlinson, [place] CarolinaPT [phone] 07/05/2017    Elray Mcgregorynthia Maelys Kinnick 07/05/2017, 4:45 PM

## 2017-07-05 NOTE — Progress Notes (Addendum)
Subjective: Patient reports "I feel pretty good, thank you"  Objective: Vital signs in last 24 hours: Temp:  [98.2 F (36.8 C)-98.7 F (37.1 C)] 98.6 F (37 C) (04/15 2358) Pulse Rate:  [84-107] 86 (04/16 0630) Resp:  [16-20] 20 (04/16 0419) BP: (108-175)/(48-90) 135/86 (04/16 0630) SpO2:  [96 %-98 %] 96 % (04/16 0419)  Intake/Output from previous day: No intake/output data recorded. Intake/Output this shift: No intake/output data recorded.  Awake, conversant. States name. Rambling conversation. Denies pain, but does note right elbo tenderness. Swelling mild, fluid, without erythema right elbow. Moves left leg slowly today, weak. Still unable to use left arm. Posey enclosed bed system in use since fall last week. Pt unable to make decisions pertaining to personal safety or manage any affairs.   Lab Results: No results for input(s): WBC, HGB, HCT, PLT in the last 72 hours. BMET No results for input(s): NA, K, CL, CO2, GLUCOSE, BUN, CREATININE, CALCIUM in the last 72 hours.  Studies/Results: No results found.  Assessment/Plan: stable  LOS: 53 days  Awaiting SNF placement   Georgiann Cockeroteat, Brian 07/05/2017, 7:47 AM  Patient is stable and awaiting placement.

## 2017-07-05 NOTE — Progress Notes (Signed)
Occupational Therapy Treatment Patient Details Name: Christopher ReichertManuel Shaffer MRN: [id number] DOB: 1965-07-17 Today's Date: 07/05/2017    History of present illness Patient is a 52 yo male, chronic alcoholic admitted after being found face down with evidence of head injury.  CT of head showed acute depressed Rt fronto-temporal skull fx with frontotemporal confluent acute hemorrhagic contusions, small Lt frontal lobe contusion, 6mm Rt > Lt midline shift with Rt uncal herniation, Rt to Lt ventricular entrapments as well ass R-IVH, SAH, and B-SDH.   He was also found to have questionable of cortical avulsion fracture of dorsal aspect of the Lt olecranon possible at insertion of triceps tendon.  Other significant PMH includes ETOH, CKD, HTN, AAA, DM, and h/o prior traumatic SAH    OT comments  Pt seen with PT.  He required max A +2 to move to EOB, and mod - max A to maintain EOB sitting.  [facility] and max A +2.   He indicated need to have BM and was transferred from w/c <> BSC with 2+ max A without use of lift equipment.  With cues, he was able to initiate sit to stand and assist with transfer ~30-35% of task!  He stood for peri care with max A +2.   He continues with behaviors consistent with Ranchos level IV.  But after a ride around unit, he was able to converse appropriately, albeit briefly with PT.  Will update goals next visit.    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   He does not spontaneously attempt to move toward EOB   Transfers Overall transfer level: Needs assistance Equipment used: Ambulation equipment used;2 person hand held assist Transfers: Sit to/from BJ'sStand;Stand Pivot Transfers Sit to Stand: Max assist;+2 physical assistance Stand pivot transfers: Max assist;+2 physical assistance       General transfer comment: initally utililzed SaraPlus for transfer, required assist to boost buttocks and extend hips.  Second transfer from w/c <> BSC, pt required max A +2 for partial sit stand - unable to fully extend hips.  He requires assist for anterior translation of trunk and to boost buttocks as well as assist to pivot LEs     Balance Overall balance assessment: Needs assistance Sitting-balance support: Feet supported;No upper extremity supported;Single extremity supported Sitting balance-Leahy Scale: Poor Sitting balance - Comments: Pt demonstrates posterior pelvic tilt with thoracic flexion and demonstrates a posterior and Left bias.   He requires mod - max a to maintain EOB sitting  Postural control: Posterior lean Standing balance support: Single extremity supported Standing balance-Leahy Scale: Poor Standing balance comment: Pt requires max A +2 to maintain standing - facilitation provided at hips for extension                            ADL either performed or assessed with clinical judgement  ADL Overall ADL's : Needs assistance/impaired                         Toilet Transfer: Maximal assistance;+2 for physical assistance(Sara Plus ) Toilet Transfer Details (indicate cue type and reason): Initially utilzed sara Plus for transfer with assist provided to boost buttocks.  Second transfer from w/c to [facility], pt able to perform partial stand pivot with max A +2 - assist to boost buttocks and to pivot  Toileting- Clothing Manipulation and Hygiene:  Total assistance;Sit to/from stand Toileting - Clothing Manipulation Details (indicate cue type and reason): requires assist for all aspects      Functional mobility during ADLs: Maximal assistance;+2 for physical assistance       Vision   Additional Comments: pt hallucinatory and confabulates.  Does not scan environment    Perception     Praxis      Cognition Arousal/Alertness: Awake/alert Behavior During Therapy: Restless;Impulsive Overall Cognitive Status: Impaired/Different from baseline Area of Impairment: Attention;Memory;Following commands;Safety/judgement;Awareness;Problem solving;Orientation               Rancho Levels of Cognitive Functioning Rancho Los Amigos Scales of Cognitive Functioning: Confused/agitated Orientation Level: Place;Time;Situation Current Attention Level: Focused;Sustained Memory: Decreased short-term memory Following Commands: Follows one step commands with increased time;Follows one step commands inconsistently Safety/Judgement: Decreased awareness of deficits;Decreased awareness of safety   Problem Solving: Slow processing;Decreased initiation;Difficulty sequencing;Requires verbal cues;Requires tactile cues General Comments: Pt is tangential, and confabulatory.   He demonstrates focused attention with brief periods of sustained attention - seconds.  He has no awareness of deficits.  He is easily internally and externally distracted         Exercises     Shoulder Instructions       General Comments Pt left sitting up in tilt in space w/c with alarm belt in place.  Spoke with RN re: use of Sara Plus for transfer     Pertinent Vitals/ Pain       Pain Assessment: No/denies pain Faces Pain Scale: Hurts a little bit Pain Location: generalized  Pain Descriptors / Indicators: Grimacing;Guarding Pain Intervention(s): Repositioned  Home Living                                          Prior Functioning/Environment               Frequency  Min 2X/week        Progress Toward Goals  OT Goals(current goals can now be found in the care plan section)  Progress towards OT goals: Progressing toward goals     Plan Discharge plan remains appropriate    Co-evaluation    PT/OT/SLP Co-Evaluation/Treatment: Yes Reason for Co-Treatment: Complexity of the patient's impairments (multi-system involvement);Necessary to address cognition/behavior during functional activity;For patient/therapist safety;To address functional/ADL transfers   OT goals addressed during session: ADL's and self-care      AM-PAC PT "6 Clicks" Daily Activity     Outcome Measure   Help from another person eating meals?: A Lot Help from another person taking care of personal grooming?: A Lot Help from another person toileting, which includes using toliet, bedpan, or urinal?: A Lot Help from another person bathing (including washing, rinsing, drying)?: A Lot Help from another person to put on and taking off regular upper body clothing?: Total Help from another person to put on and  taking off regular lower body clothing?: Total 6 Click Score: 10    End of Session Equipment Utilized During Treatment: Gait belt  OT Visit Diagnosis: Unsteadiness on feet (R26.81);Cognitive communication deficit (R41.841)   Activity Tolerance Patient tolerated treatment well   Patient Left in chair;with chair alarm set   Nurse Communication Mobility status;Need for lift equipment        Time: [id number] OT Time Calculation (min): 43 min  Charges: OT General Charges $OT Visit: 1 Visit OT Treatments $Neuromuscular Re-education: 8-22 mins  [employer], OTR/L [phone]    Jeani Hawking M 07/05/2017, 4:15 PM

## 2017-07-05 NOTE — Progress Notes (Addendum)
The patient is unable to manage his affairs.  He requires guardianship.  He has had a significant brain injury and has co-morbid issues of alcoholism, depression and anxiety.  He has been evaluated by Psychiatry on multiple occasions during this hospitalization and is on anti-psychotic medications.

## 2017-07-05 NOTE — Progress Notes (Signed)
  Speech Language Pathology Treatment: Dysphagia  Patient Details Name: [name] MRN: [id number] DOB: September 16, 1965 Today's Date: 07/05/2017 Time: [id number] SLP Time Calculation (min) (ACUTE ONLY): 13 min  Assessment / Plan / Recommendation Clinical Impression  Pt required max multimodal cues for attention.  Distracted by need to move bowels and wanting to be taken to "toilet R34" - with encouragement, he was willing to engage in limited session.  Attempted trials of thin liquids to determine if nectars may no longer be necessary.  No overt s/s of aspiration observed.  Will f/u during meal time to assess overall toleration of thins over course of meal.  HPI HPI: Patient is a 52 yo male, chronic alcoholic with right ICH, small SDH and skull fracture with right to left shift increased from 6 to 8mm per CT 05/14/17. Thrombocytopenia with likely poorly functioning platelets. Left hemiparesis with left neglect and loss of vision on left.      SLP Plan  Continue with current plan of care       Recommendations  Diet recommendations: Dysphagia 1 (puree);Thin liquid Medication Administration: Crushed with puree Supervision: Staff to assist with self feeding;Full supervision/cueing for compensatory strategies Compensations: Small sips/bites;Minimize environmental distractions;Slow rate                Oral Care Recommendations: Oral care BID Follow up Recommendations: Skilled Nursing facility;24 hour supervision/assistance SLP Visit Diagnosis: Cognitive communication deficit (R41.841);Dysphagia, oropharyngeal phase (R13.12) Plan: Continue with current plan of care       GO                Blenda MountsCouture, Iyesha Such Laurice 07/05/2017, 2:02 PM

## 2017-07-06 DIAGNOSIS — R45850 Homicidal ideations: Secondary | ICD-10-CM

## 2017-07-06 DIAGNOSIS — F10151 Alcohol abuse with alcohol-induced psychotic disorder with hallucinations: Secondary | ICD-10-CM

## 2017-07-06 DIAGNOSIS — Z7722 Contact with and (suspected) exposure to environmental tobacco smoke (acute) (chronic): Secondary | ICD-10-CM

## 2017-07-06 DIAGNOSIS — R413 Other amnesia: Secondary | ICD-10-CM

## 2017-07-06 DIAGNOSIS — F1096 Alcohol use, unspecified with alcohol-induced persisting amnestic disorder: Secondary | ICD-10-CM | POA: Diagnosis present

## 2017-07-06 LAB — GLUCOSE, CAPILLARY
GLUCOSE-CAPILLARY: 182 mg/dL — AB (ref 65–99)
GLUCOSE-CAPILLARY: 138 mg/dL — AB (ref 65–99)
GLUCOSE-CAPILLARY: 135 mg/dL — AB (ref 65–99)
Glucose-Capillary: 82 mg/dL (ref 65–99)

## 2017-07-06 NOTE — Progress Notes (Signed)
  Speech Language Pathology Treatment: Dysphagia;Cognitive-Linquistic  Patient Details Name: Christopher ReichertManuel Shaffer MRN: [id number] DOB: 1965-12-27 Today's Date: 07/06/2017 Time: [id number] SLP Time Calculation (min) (ACUTE ONLY): 27 min  Assessment / Plan / Recommendation Clinical Impression  Pt calm, non-agitated today, fewer confabulatory remarks with conversation focused on food he was eating and itching arm.  Min verbal cues to redirect.  Pt following commands without delay. No perseveratory verbalizations noted.  RL V. Pt continues to cough consistently with trials of thin liquid, concerning for aspiration.  However, he demonstrated much improved ability to attend to/recognize graham cracker, masticated without delay and swallowed with no oral residue remaining.  Recommend advancing diet to dysphagia 2; continue nectar thick liquids.  Will plan to repeat MBS either 4/18 or 4/19.     HPI HPI: Patient is a 52 yo male, chronic alcoholic with right ICH, small SDH and skull fracture with right to left shift increased from 6 to 8mm per CT 05/14/17. Thrombocytopenia with likely poorly functioning platelets. Left hemiparesis with left neglect and loss of vision on left.      SLP Plan  Continue with current plan of care;MBS(MBS on Friday)       Recommendations  Diet recommendations: Dysphagia 2 (fine chop);Nectar-thick liquid Liquids provided via: Cup Medication Administration: Crushed with puree Supervision: Staff to assist with self feeding;Full supervision/cueing for compensatory strategies Compensations: Small sips/bites;Minimize environmental distractions;Slow rate Postural Changes and/or Swallow Maneuvers: Seated upright 90 degrees;Upright 30-60 min after meal                Oral Care Recommendations: Oral care BID SLP Visit Diagnosis: Cognitive communication deficit (R41.841);Dysphagia, oropharyngeal phase (R13.12) Plan: Continue with current plan of care;MBS(MBS on Thursday or  Friday of this week)       GO                Blenda MountsCouture, [name] 07/06/2017, 2:53 PM

## 2017-07-06 NOTE — Consult Note (Addendum)
[facility] Face-to-Face Psychiatry Consult   Reason for Consult:  Hallucinations  Referring Physician:  Primary MD Patient Identification: [name] MRN:  [id number] Principal Diagnosis: Agitation Diagnosis:   Patient Active Problem List   Diagnosis Date Noted  . 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 Alcohol abuse with alcohol-induced mood disorder (HCC) [F10.14] 03/24/2017    Priority: High  . Agitation [R45.1]   . Depression [F32.9] 06/06/2017  . Altered mental status [R41.82]   . Focal hemorrhagic contusion of cerebrum (HCC) [S06.339A]   . Subdural hematoma (HCC) [S06.5X9A]   . Benign essential HTN [I10]   . Stage 3 chronic kidney disease (HCC) [N18.3]   . ETOH abuse [F10.10]   . Hypokalemia [E87.6]   . Acute blood loss anemia [D62]   . Anemia of chronic disease [D63.8]   . TBI (traumatic brain injury) (HCC) [S06.9X9A] 05/13/2017  . Tachycardia [R00.0]   . Alcohol withdrawal (HCC) [F10.239] 04/03/2017  . DM2 (diabetes mellitus, type 2) (HCC) [E11.9] 04/03/2017  . Frontal lobe contusion (HCC) [S06.339A] 01/23/2017  . Encephalopathy acute [G93.40]   . Fall [W19.XXXA]   . Subarachnoid hemorrhage following injury with brief loss of consciousness but without open intracranial wound (HCC) [S06.6X9A] 07/17/2016  . Traumatic subarachnoid hemorrhage (HCC) [S06.6X9A] 01/15/2015  . Alcohol intoxication (HCC) [F10.929] 01/15/2015  . SAH (subarachnoid hemorrhage) (HCC) [I60.9] 01/15/2015    Total Time spent with patient: 45 minutes  Subjective:   [name] is a 52 y.o. male patient admitted with alcohol related issues.  He is hallucinating frequently related to his long term use of alcohol.  No suicidal/homicidal ideations or withdrawal symptoms.  Continue Remeron 15 mg at bedtime for sleep and appetite, increase Zyprexa 5 mg BID to 10 mg BID for agitation and hallucinations.  Dr. Lucianne Muss has reviewed this patient and concurs with  the plan.  HPI:  52 yo male with a long history of alcohol abuse.  Appears to be suffering from Korsakoffs at this time.  He is having hallucinations and occasionally tells staff he wants to shoot them but no plan.  Agitated at times, increase in Zyprexa will assist in decreasing these symptoms.  Denies suicidal ideations.  Past Psychiatric History: alcohol dependence  Risk to Self: None Risk to Others:  None Prior Inpatient Therapy:  [facility] Prior Outpatient Therapy:  unknown  Past Medical History:  Past Medical History:  Diagnosis Date  . Abdominal aneurysm [facility])    Patient reported   . Chronic kidney disease, stage III (moderate) (HCC)    Deterding  . Hypertension   . Obesity (BMI 30-39.9)   . Other and unspecified hyperlipidemia   . Type II or unspecified type diabetes mellitus without mention of complication, not stated as uncontrolled     Past Surgical History:  Procedure Laterality Date  . stomach vessel aneurysm     Patient reported    Family History:  Family History  Problem Relation Age of Onset  . Diabetes Mellitus II Mother    Family Psychiatric  History: none Social History:  Social History   Substance and Sexual Activity  Alcohol Use Yes  . Alcohol/week: 7.2 oz  . Types: 12 Cans of beer per week     Social History   Substance and Sexual Activity  Drug Use No    Social History   Socioeconomic History  . Marital status: Married    Spouse name: Not on file  . Number of children: Not on file  .  Years of education: Not on file  . Highest education level: Not on file  Occupational History  . Not on file  Social Needs  . Financial resource strain: Not on file  . Food insecurity:    Worry: Not on file    Inability: Not on file  . Transportation needs:    Medical: Not on file    Non-medical: Not on file  Tobacco Use  . Smoking status: Passive Smoke Exposure - Never Smoker  . Smokeless tobacco: Never Used  Substance and Sexual Activity  . Alcohol  use: Yes    Alcohol/week: 7.2 oz    Types: 12 Cans of beer per week  . Drug use: No  . Sexual activity: Not on file  Lifestyle  . Physical activity:    Days per week: Not on file    Minutes per session: Not on file  . Stress: Not on file  Relationships  . Social connections:    Talks on phone: Not on file    Gets together: Not on file    Attends religious service: Not on file    Active member of club or organization: Not on file    Attends meetings of clubs or organizations: Not on file    Relationship status: Not on file  Other Topics Concern  . Not on file  Social History Narrative  . Not on file   Additional Social History:    Allergies:  No Known Allergies  Labs:  Results for orders placed or performed during the hospital encounter of 05/13/17 (from the past 48 hour(s))  Glucose, capillary     Status: Abnormal   Collection Time: 07/04/17  4:56 PM  Result Value Ref Range   Glucose-Capillary 115 (H) 65 - 99 mg/dL   Comment 1 Notify RN    Comment 2 Document in Chart   Glucose, capillary     Status: None   Collection Time: 07/04/17  9:25 PM  Result Value Ref Range   Glucose-Capillary 91 65 - 99 mg/dL   Comment 1 Notify RN    Comment 2 Document in Chart   Glucose, capillary     Status: None   Collection Time: 07/05/17  6:13 AM  Result Value Ref Range   Glucose-Capillary 76 65 - 99 mg/dL   Comment 1 Notify RN    Comment 2 Document in Chart   Glucose, capillary     Status: Abnormal   Collection Time: 07/05/17 11:38 AM  Result Value Ref Range   Glucose-Capillary 119 (H) 65 - 99 mg/dL  Glucose, capillary     Status: Abnormal   Collection Time: 07/05/17  5:04 PM  Result Value Ref Range   Glucose-Capillary 140 (H) 65 - 99 mg/dL  Glucose, capillary     Status: None   Collection Time: 07/05/17  9:53 PM  Result Value Ref Range   Glucose-Capillary 74 65 - 99 mg/dL   Comment 1 Notify RN    Comment 2 Document in Chart   Glucose, capillary     Status: None    Collection Time: 07/06/17  6:12 AM  Result Value Ref Range   Glucose-Capillary 82 65 - 99 mg/dL   Comment 1 Notify RN    Comment 2 Document in Chart   Glucose, capillary     Status: Abnormal   Collection Time: 07/06/17 12:53 PM  Result Value Ref Range   Glucose-Capillary 182 (H) 65 - 99 mg/dL    Current Facility-Administered Medications  Medication Dose Route  Frequency Provider Last Rate Last Dose  . acetaminophen (TYLENOL) tablet 650 mg  650 mg Oral Q4H PRN Maeola HarmanStern, Joseph, MD   650 mg at 05/21/17 0059   Or  . acetaminophen (TYLENOL) solution 650 mg  650 mg Per Tube Q4H PRN Maeola HarmanStern, Joseph, MD   650 mg at 06/30/17 1249   Or  . acetaminophen (TYLENOL) suppository 650 mg  650 mg Rectal Q4H PRN Maeola HarmanStern, Joseph, MD   650 mg at 05/13/17 2045  . acetaminophen-codeine (TYLENOL #3) 300-30 MG per tablet 1-2 tablet  1-2 tablet Oral Q4H PRN Maeola HarmanStern, Joseph, MD   2 tablet at 07/06/17 1221  . bisacodyl (DULCOLAX) suppository 10 mg  10 mg Rectal Daily PRN Maeola HarmanStern, Joseph, MD   10 mg at 05/31/17 2126  . feeding supplement (ENSURE ENLIVE) (ENSURE ENLIVE) liquid 237 mL  237 mL Oral TID BM Maeola HarmanStern, Joseph, MD   237 mL at 07/06/17 1348  . folic acid (FOLVITE) tablet 1 mg  1 mg Oral Daily Maeola HarmanStern, Joseph, MD   1 mg at 07/06/17 1042  . insulin aspart (novoLOG) injection 0-15 Units  0-15 Units Subcutaneous TID AC & HS Maeola HarmanStern, Joseph, MD   3 Units at 07/06/17 1349  . labetalol (NORMODYNE,TRANDATE) injection 10-20 mg  10-20 mg Intravenous Q2H PRN Maeola HarmanStern, Joseph, MD   20 mg at 05/26/17 0410  . MEDLINE mouth rinse  15 mL Mouth Rinse BID Maeola HarmanStern, Joseph, MD   15 mL at 07/06/17 0220  . mirtazapine (REMERON) tablet 15 mg  15 mg Oral QHS Maeola HarmanStern, Joseph, MD   15 mg at 07/05/17 2149  . OLANZapine (ZYPREXA) tablet 5 mg  5 mg Oral BID PRN Maeola HarmanStern, Joseph, MD   5 mg at 07/06/17 1221  . pantoprazole sodium (PROTONIX) 40 mg/20 mL oral suspension 40 mg  40 mg Oral Daily Maeola HarmanStern, Joseph, MD   40 mg at 07/06/17 1042  . RESOURCE THICKENUP CLEAR    Oral PRN Maeola HarmanStern, Joseph, MD      . senna-docusate (Senokot-S) tablet 1 tablet  1 tablet Oral BID Maeola HarmanStern, Joseph, MD   1 tablet at 07/06/17 1042  . thiamine (VITAMIN B-1) tablet 100 mg  100 mg Oral Daily Maeola HarmanStern, Joseph, MD   100 mg at 07/06/17 1042    Musculoskeletal: Strength & Muscle Tone: decreased Gait & Station: unsteady Patient leans: N/A  Psychiatric Specialty Exam: Physical Exam  HENT:  Head: Normocephalic.  Neck: Normal range of motion.  Respiratory: Effort normal.  Neurological: He is alert.  Psychiatric: He has a normal mood and affect. His speech is normal. Thought content normal. He is actively hallucinating. Cognition and memory are impaired. He expresses impulsivity.    Review of Systems  Psychiatric/Behavioral: Positive for hallucinations, memory loss and substance abuse.  All other systems reviewed and are negative.   Blood pressure (!) 164/84, pulse (!) 103, temperature 98 F (36.7 C), temperature source Oral, resp. rate 15, height 6' (1.829 m), weight 83.9 kg (184 lb 15.5 oz), SpO2 97 %.Body mass index is 25.09 kg/m.  General Appearance: Casual  Eye Contact:  Good  Speech:  Normal Rate  Volume:  Normal  Mood:  Anxious  Affect:  Blunt  Thought Process:  Coherent and Descriptions of Associations: Intact  Orientation:  Other:  person  Thought Content:  Hallucinations: Auditory Visual  Suicidal Thoughts:  No  Homicidal Thoughts:  No  Memory:  Immediate;   Fair Recent;   Fair Remote;   Fair  Judgement:  Impaired  Insight:  Lacking  Psychomotor Activity:  Decreased  Concentration:  Concentration: Fair and Attention Span: Fair  Recall:  [employer] of Knowledge:  Fair  Language:  Fair  Akathisia:  No  Handed:  Right  AIMS (if indicated):     Assets:  Leisure Time Resilience Social Support  ADL's:  Impaired  Cognition:  Impaired,  Moderate  Sleep:        Treatment Plan Summary: Daily contact with patient to assess and evaluate symptoms and progress  in treatment, Medication management and Plan korskoff's psychosis:  -Continue Remeron 15 mg at bedtime for sleep and appetite -Increase Zyprexa 5 mg BID to 10 mg BID for hallucinations and agitation  Disposition: No evidence of imminent risk to self or others at present.    Nanine Means, NP 07/06/2017 2:04 PM

## 2017-07-06 NOTE — Progress Notes (Addendum)
Subjective: Patient reports "I'd like to sit down and talk with you. [place]  Objective: Vital signs in last 24 hours: Temp:  [97.9 F (36.6 C)-98.6 F (37 C)] 97.9 F (36.6 C) (04/17 0451) Pulse Rate:  [78-102] 93 (04/17 0451) Resp:  [18-20] 18 (04/17 0451) BP: (132-188)/(76-88) 175/76 (04/17 0451) SpO2:  [96 %-99 %] 98 % (04/17 0451)  Intake/Output from previous day: 04/16 0701 - 04/17 0700 In: 360 [P.O.:360] Out: 1 [Urine:1] Intake/Output this shift: No intake/output data recorded.  Awake, talking in rook on nurse's arrival. Rambling conversation. Moves left foot today, not moving left arm or hand. Right elbow mildly erythematous with fluid swelling. He notes pain when asked specifically. Posey Bed Enclosure in use since fall last week. He continues to move about in bed without awareness of safety.  Lab Results: No results for input(s): WBC, HGB, HCT, PLT in the last 72 hours. BMET No results for input(s): NA, K, CL, CO2, GLUCOSE, BUN, CREATININE, CALCIUM in the last 72 hours.  Studies/Results: No results found.  Assessment/Plan: Hallucinations today   LOS: 54 days  Awaits SNF. Will attempt lambswool elbow pad to minimize skin breakdown and joint pain - may not be tolerated d/t pt poor awareness and movement. Per Dr. Venetia MaxonStern, will request re-eval by psychiatry for guidance with tx of pts hallucinations.   Georgiann Cockeroteat, Brian 07/06/2017, 8:38 AM    Intermittent confusion and disorientation persists.

## 2017-07-07 ENCOUNTER — Inpatient Hospital Stay (HOSPITAL_COMMUNITY): Payer: Medicaid Other

## 2017-07-07 LAB — GLUCOSE, CAPILLARY
Glucose-Capillary: 91 mg/dL (ref 65–99)
Glucose-Capillary: 137 mg/dL — ABNORMAL HIGH (ref 65–99)
Glucose-Capillary: 104 mg/dL — ABNORMAL HIGH (ref 65–99)
Glucose-Capillary: 105 mg/dL — ABNORMAL HIGH (ref 65–99)

## 2017-07-07 MED ORDER — OLANZAPINE 10 MG PO TABS
10.0000 mg | ORAL_TABLET | Freq: Two times a day (BID) | ORAL | Status: DC | PRN
  Administered 2017-07-08 – 2017-07-21 (×15): 10 mg via ORAL
  Filled 2017-07-07 (×19): qty 1

## 2017-07-07 NOTE — Progress Notes (Addendum)
Physical Therapy Treatment Patient Details Name: [name] MRN: [id number] DOB: [birth year] Today's Date: 07/07/2017    History of Present Illness Patient is a 52 yo male, chronic alcoholic admitted after being found face down with evidence of head injury.  CT of head showed acute depressed Rt fronto-temporal skull fx with frontotemporal confluent acute hemorrhagic contusions, small Lt frontal lobe contusion, 41m Rt > Lt midline shift with Rt uncal herniation, Rt to Lt ventricular entrapments as well ass R-IVH, SAH, and B-SDH.   He was also found to have questionable of cortical avulsion fracture of dorsal aspect of the Lt olecranon possible at insertion of triceps tendon.  Other significant PMH includes ETOH, CKD, HTN, AAA, DM, and h/o prior traumatic SAH     PT Comments    Pt presents as a Rancho level V-VI today.  He was appropriate with his conversation and physical requests (he needed to go to the bathroom and he went to the bathroom).  He followed basic commands and initiated movement to help get his needs met.  He remains appropriate for continues acute therapy and we will do our best to keep him with a consistent therapist.    Follow Up Recommendations  SNF;Supervision/Assistance - 24 hour     Equipment Recommendations  Wheelchair (measurements PT);Wheelchair cushion (measurements PT);3in1 (PT)    Recommendations for Other Services   NA     Precautions / Restrictions Precautions Precautions: Fall Precaution Comments: TBI in vail bed Required Braces or Orthoses: Other Brace/Splint(prosthetic R eye) Restrictions Weight Bearing Restrictions: No    Mobility  Bed Mobility Overal bed mobility: Needs Assistance Bed Mobility: Rolling;Sidelying to Sit Rolling: Max assist;+2 for safety/equipment Sidelying to sit: Mod assist;+2 for safety/equipment       General bed mobility comments: Max assist to roll to initiate move towards EOB, facilitation at trunk and pelvis to come  to sitting. Pt put out his right elbow to assist.   Transfers Overall transfer level: Needs assistance Equipment used: None Transfers: Sit to/from SOmnicareSit to Stand: Mod assist;Max assist;+2 physical assistance Stand pivot transfers: Max assist;Mod assist;+2 physical assistance       General transfer comment: Multiple stand pivots both to the right and to the left.  Assist needed at his trunk and to help stabilize his left knee in particular to pivot to [facility] to the toilet x 2.  Pt reaching and assisting with his right hand and attempting pivotal steps with feet.             WInformation systems managermobility: Yes Wheelchair propulsion: Both lower extermities Wheelchair parts: Needs assistance Distance: 10 Wheelchair Assistance Details (indicate cue type and reason): facilitated movement of left leg to attempt to propel WC in sitting with both feet. Pt pulling with his right hand (on furniture) and his right foot, manual facilitation and tapping to get him to attempt to use his left foot.  Mod assist overall for WC propeling.   Modified Rankin (Stroke Patients Only)       Balance Overall balance assessment: Needs assistance Sitting-balance support: Single extremity supported;Feet unsupported Sitting balance-Leahy Scale: Poor Sitting balance - Comments: Mod assist overall with moments of min assist EOB. Feet were unsupported most of the time due to elevated resting height of enclosure bed.  Much less left lateral lean than in previous sessions.  Postural control: Posterior lean;Left lateral lean Standing balance support: Single extremity supported Standing balance-Leahy Scale: Poor Standing balance comment: two person mod  to max assist with left knee blocked, trunk supported.  As pt fatigues he leans more to the left.                             Cognition Arousal/Alertness: Awake/alert Behavior During Therapy:  Impulsive;Flat affect Overall Cognitive Status: Impaired/Different from baseline Area of Impairment: Orientation;Attention;Memory;Safety/judgement;Following commands;Awareness;Problem solving;Rancho level               Rancho Levels of Cognitive Functioning Rancho Los Amigos Scales of Cognitive Functioning: Confused/inappropriate/non-agitated(solid V emerging VI) Orientation Level: Disoriented to;Place;Situation(specifics of situation) Current Attention Level: Sustained Memory: Decreased short-term memory;Decreased recall of precautions Following Commands: Follows one step commands consistently;Follows one step commands with increased time(structured environment) Safety/Judgement: Decreased awareness of safety;Decreased awareness of deficits Awareness: Intellectual Problem Solving: Difficulty sequencing;Requires verbal cues;Requires tactile cues General Comments: Pt did much better cognitively today than in sessions past.  He was able to follow most basic commands, would initiate movement to command, and was appropriately requesting to go to the bathroom.  There were times he would repeat questions or statements addressed earlier in the session.  He was easily re-directed to task today.               Pertinent Vitals/Pain Pain Assessment: Faces Faces Pain Scale: Hurts a little bit Pain Location: right arm Pain Descriptors / Indicators: Grimacing(with end range flexion ROM) Pain Intervention(s): Limited activity within patient's tolerance;Monitored during session;Repositioned           PT Goals (current goals can now be found in the care plan section) Acute Rehab PT Goals Patient Stated Goal: to "poop" Progress towards PT goals: Progressing toward goals    Frequency    Min 2X/week      PT Plan Current plan remains appropriate    Co-evaluation PT/OT/SLP Co-Evaluation/Treatment: Yes Reason for Co-Treatment: Complexity of the patient's impairments (multi-system  involvement);For patient/therapist safety;Necessary to address cognition/behavior during functional activity;To address functional/ADL transfers PT goals addressed during session: Mobility/safety with mobility;Balance;Proper use of DME;Strengthening/ROM        AM-PAC PT "6 Clicks" Daily Activity  Outcome Measure  Difficulty turning over in bed (including adjusting bedclothes, sheets and blankets)?: Unable Difficulty moving from lying on back to sitting on the side of the bed? : Unable Difficulty sitting down on and standing up from a chair with arms (e.g., wheelchair, bedside commode, etc,.)?: Unable Help needed moving to and from a bed to chair (including a wheelchair)?: Total Help needed walking in hospital room?: Total Help needed climbing 3-5 steps with a railing? : Total 6 Click Score: 6    End of Session Equipment Utilized During Treatment: Gait belt Activity Tolerance: Patient tolerated treatment well Patient left: in chair;with call bell/phone within reach;with chair alarm set;with restraints reapplied(R hand mitten, chair alarm belt) Nurse Communication: Mobility status;Need for lift equipment(to RN tech (use steady)) PT Visit Diagnosis: Other abnormalities of gait and mobility (R26.89);Other symptoms and signs involving the nervous system (R29.898);Hemiplegia and hemiparesis Hemiplegia - Right/Left: Left Hemiplegia - dominant/non-dominant: Non-dominant Hemiplegia - caused by: Unspecified     Time: 9030-1499 PT Time Calculation (min) (ACUTE ONLY): 64 min  Charges:  $Therapeutic Activity: 23-37 mins          Samarah Hogle B. [place], [place], DPT [phone]            07/07/2017, 12:31 PM

## 2017-07-07 NOTE — Progress Notes (Signed)
Occupational Therapy Treatment Patient Details Name: [name] MRN: [id number] DOB: 12-10-1965 Today's Date: 07/07/2017    History of present illness Patient is a 52 yo male, chronic alcoholic admitted after being found face down with evidence of head injury.  CT of head showed acute depressed Rt fronto-temporal skull fx with frontotemporal confluent acute hemorrhagic contusions, small Lt frontal lobe contusion, 6mm Rt > Lt midline shift with Rt uncal herniation, Rt to Lt ventricular entrapments as well ass R-IVH, SAH, and B-SDH.   He was also found to have questionable of cortical avulsion fracture of dorsal aspect of the Lt olecranon possible at insertion of triceps tendon.  Other significant PMH includes ETOH, CKD, HTN, AAA, DM, and h/o prior traumatic SAH    OT comments  Pt is making excellent progress.  He demonstrates behaviors consistent with Ranchos level V - VI.  He was able to sustain attention to grooming task x 3 mins; he is able to appropriately voice his needs, followed one step commands consistently.  He required mod A - max A +2 to perform stand pivot transfers to commode x 3.   Goals updated.  Will continue to follow.   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  He is now 8+ weeks post injury and has been out of his sling for unkown period of time       Mobility Bed Mobility Overal bed mobility: Needs Assistance Bed Mobility: Rolling;Sidelying to Sit Rolling: Max assist;+2 for safety/equipment Sidelying to sit: Mod assist;+2 for  safety/equipment       General bed mobility comments: Max assist to roll to initiate move towards EOB, facilitation at trunk and pelvis to come to sitting. Pt put out his right elbow to assist.   Transfers Overall transfer level: Needs assistance Equipment used: None Transfers: Sit to/from [employer] Sit to Stand: Mod assist;Max assist;+2 physical assistance Stand pivot transfers: Max assist;Mod assist;+2 physical assistance       General transfer comment: Multiple stand pivots both to the right and to the left.  Assist needed at his trunk and to help stabilize his left knee in particular to pivot to [facility] and to the toilet x 2.  Pt reaching and assisting with his right hand and attempting pivotal steps with feet.     Balance Overall balance assessment: Needs assistance Sitting-balance support: Single extremity supported;Feet unsupported Sitting balance-Leahy Scale: Poor Sitting balance - Comments: Mod assist overall with moments of min assist EOB. Feet were unsupported most of the time due to elevated resting height of enclosure bed.  Much less left lateral lean than in previous sessions.  Postural control: Posterior lean;Left lateral lean Standing balance support: Single extremity supported Standing balance-Leahy Scale: Poor Standing balance comment: two person mod to max assist with left knee blocked, trunk supported.  As pt fatigues he leans more to the left.                            ADL either performed or assessed with clinical judgement   ADL Overall ADL's : Needs assistance/impaired  Grooming: Oral care;Sitting;Minimal assistance Grooming Details (indicate cue type and reason): Pt required assist to apply toothpaste on toothbrush and to run toothbrush underwater as he overshoots                  Toilet Transfer: Maximal assistance;Moderate assistance;+2 for safety/equipment;+2 for physical assistance;Stand-pivot;BSC;Regular Toilet;Grab  bars Toilet Transfer Details (indicate cue type and reason): Pt transferred w/c <> BSC x 1, and w/c <> regular commode with 3in1 x 2.  He initially required max A +2, but progressed to mod A +2.  He was able to self initiate activity at times and was able to reach for grab bar to assist with standing  Toileting- Clothing Manipulation and Hygiene: Total assistance;Sit to/from stand       Functional mobility during ADLs: Moderate assistance;Maximal assistance;+2 for safety/equipment;+2 for physical assistance       Vision       Perception     Praxis      Cognition Arousal/Alertness: Awake/alert Behavior During Therapy: Impulsive;Flat affect Overall Cognitive Status: Impaired/Different from baseline Area of Impairment: Orientation;Attention;Memory;Safety/judgement;Following commands;Awareness;Problem solving;Rancho level               Rancho Levels of Cognitive Functioning Rancho Los Amigos Scales of Cognitive Functioning: Confused/inappropriate/non-agitated(solid V emerging VI) Orientation Level: Disoriented to;Place;Situation(specifics of situation) Current Attention Level: Sustained Memory: Decreased short-term memory;Decreased recall of precautions Following Commands: Follows one step commands consistently;Follows one step commands with increased time(structured environment) Safety/Judgement: Decreased awareness of safety;Decreased awareness of deficits Awareness: Intellectual Problem Solving: Difficulty sequencing;Requires verbal cues;Requires tactile cues General Comments: Pt did much better cognitively today than in sessions past. He was able to sustain attention ~ 3 mins to brush teeth thoroughly, and was able to sequence through activity without cues.  He was able to follow most basic commands, would initiate movement to command, and was appropriately requesting to go to the bathroom.  There were times he would repeat questions or statements addressed earlier in the  session.  He was easily re-directed to task today.          Exercises     Shoulder Instructions       General Comments Pt left sitting in tilt in space w/c with alarm belt in place     Pertinent Vitals/ Pain       Pain Assessment: Faces Faces Pain Scale: Hurts a little bit Pain Location: right arm Pain Descriptors / Indicators: Grimacing(with end range flexion ROM) Pain Intervention(s): Limited activity within patient's tolerance;Monitored during session;Repositioned  Home Living                                          Prior Functioning/Environment              Frequency  Min 2X/week        Progress Toward Goals  OT Goals(current goals can now be found in the care plan section)  Progress towards OT goals: Progressing toward goals  Acute Rehab OT Goals Patient Stated Goal: to "poop" OT Goal Formulation: Patient unable to participate in goal setting Time For Goal Achievement: 07/21/17 Potential to Achieve Goals: Good ADL Goals Pt Will Perform Eating: with min assist;sitting Pt Will Perform Grooming: with min assist;sitting Pt Will Perform Upper Body Bathing: with mod assist;sitting Pt Will Transfer to Toilet: with mod assist;with +2 assist;bedside commode Additional ADL Goal #1: Pt will sustain attention to  simple ADL task x 4 mins with min cues Additional ADL Goal #2: Pt will demonstrate emerging awareness of deficits with mod cues   Plan Discharge plan remains appropriate    Co-evaluation    PT/OT/SLP Co-Evaluation/Treatment: Yes Reason for Co-Treatment: Complexity of the patient's impairments (multi-system involvement);Necessary to address cognition/behavior during functional activity;For patient/therapist safety;To address functional/ADL transfers PT goals addressed during session: Mobility/safety with mobility;Balance;Proper use of DME;Strengthening/ROM OT goals addressed during session: ADL's and self-care      AM-PAC PT "6  Clicks" Daily Activity     Outcome Measure   Help from another person eating meals?: A Lot Help from another person taking care of personal grooming?: A Lot Help from another person toileting, which includes using toliet, bedpan, or urinal?: A Lot Help from another person bathing (including washing, rinsing, drying)?: A Lot Help from another person to put on and taking off regular upper body clothing?: A Lot Help from another person to put on and taking off regular lower body clothing?: Total 6 Click Score: 11    End of Session    OT Visit Diagnosis: Unsteadiness on feet (R26.81);Cognitive communication deficit (R41.841)   Activity Tolerance Patient tolerated treatment well   Patient Left in chair;with call bell/phone within reach;with chair alarm set   Nurse Communication Mobility status;Need for lift equipment        Time: [phone] [id number] OT Time Calculation (min): 60 min  Charges: OT General Charges $OT Visit: 1 Visit OT Treatments $Self Care/Home Management : 23-37 mins  Reynolds AmericanWendi Jaxtyn Linville, OTR/L [phone]    Jeani HawkingConarpe, Laina Guerrieri M 07/07/2017, 2:00 PM

## 2017-07-07 NOTE — Care Management Note (Signed)
Case Management Note  Patient Details  Name: Christopher ReichertManuel Shaffer MRN: [id number] Date of Birth: 04-30-1965  Subjective/Objective:                    Action/Plan: Pt continues on the DTP list for SNF. CM following.   Expected Discharge Date:  05/20/17               Expected Discharge Plan:  Skilled Nursing Facility  In-House Referral:  Clinical Social Work  Discharge planning Services  CM Consult  Post Acute Care Choice:  NA Choice offered to:  NA  DME Arranged:  N/A DME Agency:  NA  HH Arranged:  NA HH Agency:  NA  Status of Service:  In process, will continue to follow  If discussed at Long Length of Stay Meetings, dates discussed:    Additional Comments:  [name] F Dalisha Shively, RN 07/07/2017, 3:25 PM

## 2017-07-07 NOTE — Progress Notes (Addendum)
Subjective: Patient reports "Good morning.Marland Kitchen.Marland Kitchen.I feel ok"  Objective: Vital signs in last 24 hours: Temp:  [97.9 F (36.6 C)-98.4 F (36.9 C)] 98.4 F (36.9 C) (04/18 0924) Pulse Rate:  [82-118] 82 (04/18 0924) Resp:  [15-20] 20 (04/18 0924) BP: (128-164)/(72-120) 130/72 (04/18 0924) SpO2:  [95 %-99 %] 96 % (04/18 0924) Weight:  [73.7 kg (162 lb 6.4 oz)] 73.7 kg (162 lb 6.4 oz) (04/18 0511)  Intake/Output from previous day: No intake/output data recorded. Intake/Output this shift: No intake/output data recorded.  Sitting up in wheelchair with safety belt in place. Reports no complaints at present, answers questions but offers no conversation. Moves left foot today, but not moving left leg or arm.  Right elbow protective pad in place. Remains unable to manage his affairs, unable to make decisions regarding safety and/or self care.  Lab Results: No results for input(s): WBC, HGB, HCT, PLT in the last 72 hours. BMET No results for input(s): NA, K, CL, CO2, GLUCOSE, BUN, CREATININE, CALCIUM in the last 72 hours.  Studies/Results: No results found.  Assessment/Plan: Stable  LOS: 55 days  Appreciate psychiatry's assistance. Increasing Zyprexa to 10mg  BID prn as recommended. Awaiting SNF placement.    Georgiann Cockeroteat, Brian 07/07/2017, 11:31 AM   Patient is stable.  He is awaiting placement.

## 2017-07-08 ENCOUNTER — Inpatient Hospital Stay (HOSPITAL_COMMUNITY): Payer: Medicaid Other

## 2017-07-08 LAB — GLUCOSE, CAPILLARY
GLUCOSE-CAPILLARY: 89 mg/dL (ref 65–99)
GLUCOSE-CAPILLARY: 90 mg/dL (ref 65–99)
Glucose-Capillary: 93 mg/dL (ref 65–99)
Glucose-Capillary: 127 mg/dL — ABNORMAL HIGH (ref 65–99)

## 2017-07-08 NOTE — Progress Notes (Signed)
Modified Barium Swallow Progress Note  Patient Details  Name: Carlyn ReichertManuel Asch MRN: [id number] Date of Birth: Aug 11, 1965  Today's Date: 07/08/2017  Modified Barium Swallow completed.  Full report located under Chart Review in the Imaging Section.  Brief recommendations include the following:  Clinical Impression    Pt presents with a Mild oropharyngal dysphagia characterized by weak lingual propulsion/retraction and reduced oral control of bolus leading to Mod vallecular/lingual residue, premature spillage of liquid trials, and airway compromise. Pt had frequent shallow penentration with thin liquids that he was able to clear from his laryngeal vestibule with Mod verbal cues to cough. Pt required Mod verbal/visual cues to use a chin tuck with thin liquid trials; however, pt had limited consisitentcy coordinating and holding a chin tuck posture. A straw facilitated the posture, but allowed pt to take in large bolus that was silently aspirated. The frequency of pt's penetration was reduced with nectar thick liquids, with pt able to spontaneously clear penetrates from the laryngeal vestibule with a consecutive swallow. One other instance of silent aspiration occured when pt attempted to speak and swallow simultaneously with nectar thick liquids. Recommend pt continue with current diet of Dysphagia 2 (chopped) and nectar thick liquids, medications administered crushed in puree, aspiration precautions, and full supervision. Pt permitted to have limited cup sips of water and ice chips in between meals following oral care with cough after every swallow. SLP will continue to follow pt to maximize cognitive function as well as safety with diet and aspiration precautions.    Swallow Evaluation Recommendations       SLP Diet Recommendations: Dysphagia 2 (Fine chop) solids;Nectar thick liquid   Liquid Administration via: Cup       Supervision: Staff to assist with self feeding;Full supervision/cueing  for compensatory strategies   Compensations: Small sips/bites;Minimize environmental distractions;Slow rate   Postural Changes: Seated upright at 90 degrees   Oral Care Recommendations: Oral care BID   Other Recommendations: Prohibited food (jello, ice cream, thin soups)   [place] Matty Vanroekel SLP Student Clinician  [place] Dusty Raczkowski 07/08/2017,10:31 AM

## 2017-07-08 NOTE — Progress Notes (Signed)
CSW continuing to follow. Awaiting difficult to place bed at SNF.   Abigail ButtsSusan Janayia Burggraf, LCSWA [phone]

## 2017-07-09 LAB — CREATININE, SERUM
Creatinine, Ser: 0.57 mg/dL — ABNORMAL LOW (ref 0.61–1.24)
GFR calc Af Amer: >60 mL/min (ref >60)
GFR calc non Af Amer: >60 mL/min (ref >60)

## 2017-07-09 LAB — CBC
HEMATOCRIT: 36.1 % — AB (ref 39.0–52.0)
HEMOGLOBIN: 11.8 g/dL — AB (ref 13.0–17.0)
MCH: 30.8 pg (ref 26.0–34.0)
MCHC: 32.7 g/dL (ref 30.0–36.0)
MCV: 94.3 fL (ref 78.0–100.0)
PLATELETS: 153 10*3/uL (ref 150–400)
RBC: 3.83 MIL/uL — ABNORMAL LOW (ref 4.22–5.81)
RDW: 12.8 % (ref 11.5–15.5)
WBC: 6.4 10*3/uL (ref 4.0–10.5)

## 2017-07-09 LAB — GLUCOSE, CAPILLARY
GLUCOSE-CAPILLARY: 106 mg/dL — AB (ref 65–99)
GLUCOSE-CAPILLARY: 79 mg/dL (ref 65–99)
Glucose-Capillary: 90 mg/dL (ref 65–99)

## 2017-07-09 MED ORDER — ENOXAPARIN SODIUM 40 MG/0.4ML ~~LOC~~ SOLN
40.0000 mg | SUBCUTANEOUS | Status: DC
  Administered 2017-07-09 – 2017-07-26 (×18): 40 mg via SUBCUTANEOUS
  Filled 2017-07-09 (×17): qty 0.4

## 2017-07-09 NOTE — Progress Notes (Signed)
Subjective: Patient reports "[place] [employer] [place]  I'm in [place]."  Objective: Vital signs in last 24 hours: Temp:  [98 F (36.7 C)-98.4 F (36.9 C)] 98.2 F (36.8 C) (04/20 0820) Pulse Rate:  [64-78] 74 (04/20 0820) Resp:  [18-20] 18 (04/20 0820) BP: (111-122)/(60-72) 122/68 (04/20 0820) SpO2:  [98 %-99 %] 99 % (04/20 0820)  Intake/Output from previous day: 04/19 0701 - 04/20 0700 In: 740 [P.O.:740] Out: -  Intake/Output this shift: No intake/output data recorded.  Physical Exam: Patient remains confused.  No other changes appreciated.  Lab Results: No results for input(s): WBC, HGB, HCT, PLT in the last 72 hours. BMET No results for input(s): NA, K, CL, CO2, GLUCOSE, BUN, CREATININE, CALCIUM in the last 72 hours.  Studies/Results: Dg Swallowing Func-speech Pathology  Result Date: 07/08/2017 Objective Swallowing Evaluation: Type of Study: MBS-Modified Barium Swallow Study  Patient Details Name: [name] MRN: [id number] Date of Birth: 1966-02-17 Today's Date: 07/08/2017 Time: SLP Start Time (ACUTE ONLY): 4098 -SLP Stop Time (ACUTE ONLY): 0939 SLP Time Calculation (min) (ACUTE ONLY): 18 min Past Medical History: Past Medical History: Diagnosis Date . Abdominal aneurysm [facility])   Patient reported  . Chronic kidney disease, stage III (moderate) (HCC)   Deterding . Hypertension  . Obesity (BMI 30-39.9)  . Other and unspecified hyperlipidemia  . Type II or unspecified type diabetes mellitus without mention of complication, not stated as uncontrolled  Past Surgical History: Past Surgical History: Procedure Laterality Date . stomach vessel aneurysm    Patient reported  HPI: Patient is a 52 yo male, chronic alcoholic with right ICH, small SDH and skull fracture with right to left shift increased from 6 to 8mm per CT 05/14/17. Thrombocytopenia with likely poorly functioning platelets. Left hemiparesis with left neglect and  loss of vision on left.  Subjective: Pt was alert and following commands with cues Assessment / Plan / Recommendation CHL IP CLINICAL IMPRESSIONS 07/08/2017 Clinical Impression Pt presents with a Mild oropharyngal dysphagia characterized by weak lingual propulsion/retraction and reduced oral control of bolus leading to Mod vallecular/lingual residue, premature spillage of liquid trials, and airway compromise. Pt had frequent shallow penentration with thin liquids that he was able to clear from his laryngeal vestibule with Mod verbal cues to cough. Pt required Mod verbal/visual cues to use a chin tuck with thin liquid trials; however, pt had limited consisitentcy coordinating and holding a chin tuck posture. A straw facilitated the posture, but allowed pt to take in large bolus that was silently aspirated. The frequency of pt's penetration was reduced with nectar thick liquids, with pt able to spontaneously clear penetrates from the laryngeal vestibule with a consecutive swallow. One other instance of silent aspiration occured when pt attempted to speak and swallow simultaneously with nectar thick liquids. Recommend pt continue with current diet of Dysphagia 2 (chopped) and nectar thick liquids, medications administered crushed in puree, aspiration precautions, and full supervision. Pt permitted to have limited cup sips of water and ice chips in between meals following oral care with cough after every swallow. SLP will continue to follow pt to maximize cognitive function as well as safety with diet and aspiration precautions.  SLP Visit Diagnosis Dysphagia, oropharyngeal phase (R13.12) Attention and concentration deficit following -- Frontal lobe and executive function deficit following -- Impact on safety and function Moderate aspiration risk   CHL IP TREATMENT RECOMMENDATION 07/08/2017 Treatment Recommendations Therapy as outlined in treatment plan below  Prognosis 07/08/2017 Prognosis for Safe Diet Advancement Fair  Barriers to Reach Goals Cognitive deficits Barriers/Prognosis Comment -- CHL IP DIET RECOMMENDATION 07/08/2017 SLP Diet Recommendations Dysphagia 2 (Fine chop) solids;Nectar thick liquid Liquid Administration via Cup Medication Administration Crushed with puree Compensations Small sips/bites;Minimize environmental distractions;Slow rate Postural Changes Seated upright at 90 degrees   CHL IP OTHER RECOMMENDATIONS 07/08/2017 Recommended Consults -- Oral Care Recommendations Oral care BID Other Recommendations Prohibited food (jello, ice cream, thin soups);Remove water pitcher   CHL IP FOLLOW UP RECOMMENDATIONS 07/08/2017 Follow up Recommendations 24 hour supervision/assistance;Skilled Nursing facility   CHL IP FREQUENCY AND DURATION 07/08/2017 Speech Therapy Frequency (ACUTE ONLY) min 2x/week Treatment Duration 2 weeks      CHL IP ORAL PHASE 07/08/2017 Oral Phase Impaired Oral - Pudding Teaspoon -- Oral - Pudding Cup -- Oral - Honey Teaspoon -- Oral - Honey Cup -- Oral - Nectar Teaspoon -- Oral - Nectar Cup Decreased bolus cohesion;Premature spillage;Lingual/palatal residue Oral - Nectar Straw NT Oral - Thin Teaspoon -- Oral - Thin Cup Premature spillage;Decreased bolus cohesion;Lingual/palatal residue Oral - Thin Straw Premature spillage;Decreased bolus cohesion;Lingual/palatal residue Oral - Puree Reduced posterior propulsion;Weak lingual manipulation Oral - Mech Soft -- Oral - Regular Reduced posterior propulsion;Weak lingual manipulation Oral - Multi-Consistency -- Oral - Pill -- Oral Phase - Comment --  CHL IP PHARYNGEAL PHASE 07/08/2017 Pharyngeal Phase Impaired Pharyngeal- Pudding Teaspoon -- Pharyngeal -- Pharyngeal- Pudding Cup -- Pharyngeal -- Pharyngeal- Honey Teaspoon -- Pharyngeal -- Pharyngeal- Honey Cup -- Pharyngeal -- Pharyngeal- Nectar Teaspoon -- Pharyngeal -- Pharyngeal- Nectar Cup Pharyngeal residue - valleculae;Reduced tongue base retraction;Penetration/Aspiration before swallow Pharyngeal Material  enters airway, passes BELOW cords without attempt by patient to eject out (silent aspiration) Pharyngeal- Nectar Straw NT Pharyngeal -- Pharyngeal- Thin Teaspoon -- Pharyngeal -- Pharyngeal- Thin Cup Delayed swallow initiation-pyriform sinuses;Penetration/Aspiration before swallow;Pharyngeal residue - valleculae;Compensatory strategies attempted (with notebox);Reduced tongue base retraction Pharyngeal Material enters airway, remains ABOVE vocal cords and not ejected out Pharyngeal- Thin Straw Penetration/Aspiration before swallow;Pharyngeal residue - valleculae;Reduced tongue base retraction;Compensatory strategies attempted (with notebox);Delayed swallow initiation-pyriform sinuses Pharyngeal Material enters airway, passes BELOW cords without attempt by patient to eject out (silent aspiration) Pharyngeal- Puree Reduced tongue base retraction;Pharyngeal residue - valleculae Pharyngeal Material does not enter airway Pharyngeal- Mechanical Soft -- Pharyngeal -- Pharyngeal- Regular Reduced tongue base retraction;Pharyngeal residue - valleculae Pharyngeal -- Pharyngeal- Multi-consistency -- Pharyngeal -- Pharyngeal- Pill -- Pharyngeal -- Pharyngeal Comment --  CHL IP CERVICAL ESOPHAGEAL PHASE 07/08/2017 Cervical Esophageal Phase WFL Pudding Teaspoon -- Pudding Cup -- Honey Teaspoon -- Honey Cup -- Nectar Teaspoon -- Nectar Cup -- Nectar Straw -- Thin Teaspoon -- Thin Cup -- Thin Straw -- Puree -- Mechanical Soft -- Regular -- Multi-consistency -- Pill -- Cervical Esophageal Comment -- No flowsheet data found. Maxcine Hamaiewonsky, Laura 07/08/2017, 1:24 PM  Note populated for [place] Jarrett, Student SLP Maxcine HamLaura Paiewonsky, M.A. CCC-SLP [phone]              Assessment/Plan: Approaching 2 months in hospital. Awaiting placement.  Dysphagia 2 diet.  Will start low dose lovenox as patient has hemiparesis, but will not wear SCDs.    LOS: 57 days    Willodene Stallings D, MD 07/09/2017, 9:57 AM

## 2017-07-10 LAB — GLUCOSE, CAPILLARY
GLUCOSE-CAPILLARY: 91 mg/dL (ref 65–99)
GLUCOSE-CAPILLARY: 134 mg/dL — AB (ref 65–99)

## 2017-07-10 NOTE — Progress Notes (Signed)
Subjective: [place]  Objective: Vital signs in last 24 hours: Temp:  [97.9 F (36.6 C)-98.6 F (37 C)] 98.5 F (36.9 C) (04/21 0648) Pulse Rate:  [80-101] 92 (04/21 0648) Resp:  [18-19] 19 (04/21 0648) BP: (126-134)/(80-98) 128/81 (04/21 0648) SpO2:  [95 %-100 %] 95 % (04/21 0648)  Intake/Output from previous day: 04/20 0701 - 04/21 0700 In: 240 [P.O.:240] Out: -  Intake/Output this shift: No intake/output data recorded.  Physical Exam: No change  Lab Results: Recent Labs    07/09/17 1103  WBC 6.4  HGB 11.8*  HCT 36.1*  PLT 153   BMET Recent Labs    07/09/17 1103  CREATININE 0.57*    Studies/Results: No results found.  Assessment/Plan: 2 months in hospital.  No active issues.  Continuing to await placement.    LOS: 58 days    Tally Mckinnon D, MD 07/10/2017, 11:07 AM

## 2017-07-11 LAB — GLUCOSE, CAPILLARY
GLUCOSE-CAPILLARY: 85 mg/dL (ref 65–99)
GLUCOSE-CAPILLARY: 128 mg/dL — AB (ref 65–99)
GLUCOSE-CAPILLARY: 108 mg/dL — AB (ref 65–99)
Glucose-Capillary: 105 mg/dL — ABNORMAL HIGH (ref 65–99)

## 2017-07-11 NOTE — Progress Notes (Signed)
Nutrition Follow-up  DOCUMENTATION CODES:   Not applicable  INTERVENTION:  Continue Ensure Enlive po TID, each supplement provides 350 kcal and 20 grams of protein  NUTRITION DIAGNOSIS:   Inadequate oral intake related to dysphagia, lethargy/confusion as evidenced by meal completion < 50%. -resolved with PO intake, interventions  GOAL:   Patient will meet greater than or equal to 90% of their needs -progressing  MONITOR:   PO intake, Supplement acceptance, Diet advancement, Weight trends  ASSESSMENT:   Pt with PMH of ETOH abuse, previous TBI, AAA, CKD, HTN, with frequent visit to ER admitted 2/22 (found down with head injury) with R ICH, small SDH, skull fx, L hemiparesis, L neglect and loss of vision on the left.   Patient advanced to NDD2/Nectar thick liquids Was falling asleep during meal time when RD visited, can meet needs if he stays awake for meals. PO intake 70% over the last 8 meals. Discussed with SLP and NT. Weight down from 184 pounds to 162 pounds over 1 month. Total weight loss of 26 pounds/14% thus far. RD will continue to monitor PO intake.  Labs reviewed:  Na 132, K+ 3.4 CBGs 108, 128 Triglycerides 372  Medications reviewed and include:  Remeron, Senokot-S, Folic Acid, Insulin, Thiamine  Diet Order:  Fall precautions DIET DYS 2 Room service appropriate? Yes; Fluid consistency: Nectar Thick  EDUCATION NEEDS:   No education needs have been identified at this time  Skin:  Skin Assessment: Reviewed RN Assessment Skin Integrity Issues:: Other (Comment) Other: non pressure wound to back  Last BM:  07/09/2017  Height:   Ht Readings from Last 1 Encounters:  05/13/17 6' (1.829 m)    Weight:   Wt Readings from Last 1 Encounters:  07/11/17 158 lb 8.2 oz (71.9 kg)    Ideal Body Weight:  80.9 kg  BMI:  Body mass index is 21.5 kg/m.  Estimated Nutritional Needs:   Kcal:  2100-2300  Protein:  125-140 grams  Fluid:  >2.1 L/day  [name]  M. Manali Mcelmurry, MS, RD LDN Inpatient Clinical Dietitian Pager [phone]

## 2017-07-11 NOTE — Progress Notes (Addendum)
Subjective: Patient reports "I need a pain shot ...because I haven't had one today"  Objective: Vital signs in last 24 hours: Temp:  [97.8 F (36.6 C)-98.8 F (37.1 C)] 97.9 F (36.6 C) (04/22 0501) Pulse Rate:  [102-107] 102 (04/22 0501) Resp:  [18-21] 21 (04/22 0501) BP: (124-143)/(96-100) 124/100 (04/22 0501) SpO2:  [94 %-97 %] 97 % (04/22 0501) Weight:  [71.9 kg (158 lb 8.2 oz)] 71.9 kg (158 lb 8.2 oz) (04/22 0500)  Intake/Output from previous day: No intake/output data recorded. Intake/Output this shift: No intake/output data recorded.  Awake, bathed by nursing after BM in bed. Confusion persists. Left hemiparesis with minimal weak left leg movement today. No LUE movement. Left hand pale, warm, without swelling. (Pt tends to lay on the left hand - will monitor). Posey bed enclosure continues as pt is unaware of his surroundings r/t safety.  Lab Results: Recent Labs    07/09/17 1103  WBC 6.4  HGB 11.8*  HCT 36.1*  PLT 153   BMET Recent Labs    07/09/17 1103  CREATININE 0.57*    Studies/Results: No results found.  Assessment/Plan: stable  LOS: 59 days  Awaiting SNF   Poteat, Arlys JohnBrian 07/11/2017, 7:24 AM   Stable.  Awaiting placement.

## 2017-07-11 NOTE — Progress Notes (Addendum)
Nurse Placed patient in a low bed for safety. Patient is Calm and resting. Will continue to monitor.

## 2017-07-11 NOTE — Progress Notes (Signed)
  Speech Language Pathology Treatment:    Patient Details Name: [name] MRN: [id number] DOB: 1966/02/01 Today's Date: 07/11/2017 Time: [id number] SLP Time Calculation (min) (ACUTE ONLY): 20 min  Assessment / Plan / Recommendation Clinical Impression  Pt was seen at bedside, follow up after MBS completed 07/08/17. Pt was calm, not agitated, more easily redirectable than prior visits. Pt receiving dys 2 diet with nectar thick liquids. Nursing reports tolerance of current diet, but also indicates pocketing and holding during intake. Pt requires cues to swallow oral contents, but exhibits no overt s/s aspiration on current diet (D2/NTL). [facility] reviewed with nursing. Pt too lethargic after lunch to fully participate in cognitive treatment. Will continue efforts.    HPI HPI: Patient is a 52 yo male, chronic alcoholic with right ICH, small SDH and skull fracture with right to left shift increased from 6 to 8mm per CT 05/14/17. Thrombocytopenia with likely poorly functioning platelets. Left hemiparesis with left neglect and loss of vision on left.      SLP Plan  Continue with current plan of care       Recommendations  Diet recommendations: Dysphagia 2 (fine chop);Nectar-thick liquid Liquids provided via: Cup;No straw Medication Administration: Crushed with puree Supervision: Staff to assist with self feeding;Full supervision/cueing for compensatory strategies Compensations: Small sips/bites;Minimize environmental distractions;Slow rate Postural Changes and/or Swallow Maneuvers: Seated upright 90 degrees;Upright 30-60 min after meal                Oral Care Recommendations: Oral care BID Follow up Recommendations: 24 hour supervision/assistance;Skilled Nursing facility SLP Visit Diagnosis: Dysphagia, oropharyngeal phase (R13.12) Plan: Continue with current plan of care       GO              [name], [facility]P,  CCC-SLP Speech Language Pathologist [phone]  [name], Christopher Brown 07/11/2017, 1:06 PM

## 2017-07-12 LAB — GLUCOSE, CAPILLARY
GLUCOSE-CAPILLARY: 75 mg/dL (ref 65–99)
GLUCOSE-CAPILLARY: 143 mg/dL — AB (ref 65–99)
Glucose-Capillary: 84 mg/dL (ref 65–99)
Glucose-Capillary: 97 mg/dL (ref 65–99)

## 2017-07-12 NOTE — Progress Notes (Signed)
CSW continuing to follow for discharge needs.  Maxon Kresse LCSW [phone]  

## 2017-07-12 NOTE — Progress Notes (Addendum)
Subjective: Patient reports (sleeping)  Objective: Vital signs in last 24 hours: Temp:  [97.8 F (36.6 C)-98.1 F (36.7 C)] 97.8 F (36.6 C) (04/23 0400) Pulse Rate:  [80-105] 80 (04/23 0400) Resp:  [18] 18 (04/23 0400) BP: (117-142)/(73-89) 117/73 (04/23 0400) SpO2:  [95 %-98 %] 95 % (04/23 0400)  Intake/Output from previous day: 04/22 0701 - 04/23 0700 In: 360 [P.O.:360] Out: -  Intake/Output this shift: No intake/output data recorded.  Resting quietly. Staff reports improved behavior last 24-36hrs, allowing removal of Posey Bed Enclosure system. He remains unable to care for himself, make life safety decisions, or manage his affairs.   Lab Results: Recent Labs    07/09/17 1103  WBC 6.4  HGB 11.8*  HCT 36.1*  PLT 153   BMET Recent Labs    07/09/17 1103  CREATININE 0.57*    Studies/Results: No results found.  Assessment/Plan: stable  LOS: 60 days  Awaiting SNF placement   Poteat, Arlys JohnBrian 07/12/2017, 10:13 AM   Stable.  Clinical status without change.  @ months in hospital.  Awaiting placement.

## 2017-07-12 NOTE — Progress Notes (Signed)
  Speech Language Pathology Treatment: Dysphagia;Cognitive-Linquistic  Patient Details Name: Christopher ReichertManuel Shaffer MRN: [id number] DOB: 09/09/1965 Today's Date: 07/12/2017 Time:  -     Assessment / Plan / Recommendation Clinical Impression  Pt asleep when entering room, but woke and agreed to work with therapy. Pt was able to answer biographical questions and reason for being in the hospital. He was able to follow 1 step commands, but was very distractible. Pt started to perseverate on needing to be cleaned up. Redirection was successful for patient to take a few sips and bites of breakfast tray. Pt quickly returned to needing help getting to restroom and needing to be cleaned up. Problem solving with patient to use call bell and call for help was attempted. Patient needed moderate verbal cues to complete task. Staff was notified. Staff also reported that patient is tolerating diet well, but wants to be fed, when is able to do it himself. Recommend continuing diet of Dys 2, nectar. Cognitive therapy to include problem solving, using his call light for help instead of calling out verbally, and safety awareness.    HPI HPI: Patient is a 52 yo male, chronic alcoholic with right ICH, small SDH and skull fracture with right to left shift increased from 6 to 8mm per CT 05/14/17. Thrombocytopenia with likely poorly functioning platelets. Left hemiparesis with left neglect and loss of vision on left.      SLP Plan  Continue with current plan of care       Recommendations  Diet recommendations: Dysphagia 2 (fine chop);Nectar-thick liquid Liquids provided via: Cup;No straw Medication Administration: Crushed with puree Supervision: Staff to assist with self feeding;Full supervision/cueing for compensatory strategies Compensations: Small sips/bites;Minimize environmental distractions;Slow rate Postural Changes and/or Swallow Maneuvers: Seated upright 90 degrees;Upright 30-60 min after meal                 Oral Care Recommendations: Oral care BID Follow up Recommendations: 24 hour supervision/assistance;Skilled Nursing facility SLP Visit Diagnosis: Dysphagia, oropharyngeal phase (R13.12) Plan: Continue with current plan of care       GO                Christopher HoseSarah J Shawn Shaffer 07/12/2017, 12:24 PM

## 2017-07-12 NOTE — Progress Notes (Signed)
Occupational Therapy Treatment Patient Details Name: Christopher ReichertManuel Clairmont MRN: [id number] DOB: 20-Jul-1965 Today's Date: 07/12/2017    History of present illness Patient is a 52 yo male, chronic alcoholic admitted after being found face down with evidence of head injury.  CT of head showed acute depressed Rt fronto-temporal skull fx with frontotemporal confluent acute hemorrhagic contusions, small Lt frontal lobe contusion, 6mm Rt > Lt midline shift with Rt uncal herniation, Rt to Lt ventricular entrapments as well ass R-IVH, SAH, and B-SDH.   He was also found to have questionable of cortical avulsion fracture of dorsal aspect of the Lt olecranon possible at insertion of triceps tendon.  Other significant PMH includes ETOH, CKD, HTN, AAA, DM, and h/o prior traumatic SAH    OT comments  Pt continues to make steady progress.  He demonstrates behaviors consistent with [place] Level [place] (confused, appropriate).  He was able to ambulate to and from bathroom with max A +2.  He had BM on the commode and was able to perform peri care with min A.  He brushed teeth with supervision.    Will continue to follow.   Follow Up Recommendations  SNF    Equipment Recommendations  None recommended by OT    Recommendations for Other Services      Precautions / Restrictions Precautions Precautions: Fall       Mobility Bed Mobility Overal bed mobility: Needs Assistance Bed Mobility: Supine to Sit     Supine to sit: Mod assist     General bed mobility comments: with mod cues, pt was able to move LEs off EOB with min A, and assist to lift trunk   Transfers Overall transfer level: Needs assistance Equipment used: 2 person hand held assist Transfers: Sit to/from BJ'sStand;Stand Pivot Transfers Sit to Stand: Max assist;+2 physical assistance Stand pivot transfers: Max assist;+2 physical assistance       General transfer comment: Pt initially states he cant stand due to being lazy and required assist to  move into standing and assist to extend hips.  He was able to pivot feet with max A to maintain balance and to advance Lt LE     Balance Overall balance assessment: Needs assistance Sitting-balance support: Feet supported;Single extremity supported Sitting balance-Leahy Scale: Poor Sitting balance - Comments: able to maintain EOB sitting statically with close min guard assist    Standing balance support: Bilateral upper extremity supported Standing balance-Leahy Scale: Poor Standing balance comment: Pt requires max A +2 due to Lt lateral lean                            ADL either performed or assessed with clinical judgement   ADL Overall ADL's : Needs assistance/impaired Eating/Feeding: Moderate assistance Eating/Feeding Details (indicate cue type and reason): Requires assist due to attentional deficits and decreased vigilence  Grooming: Wash/dry hands;Wash/dry face;Oral care;Minimal assistance;Sitting Grooming Details (indicate cue type and reason): assist for thoroughness when washing hands                  Toilet Transfer: Maximal assistance;+2 for physical assistance;Ambulation;Comfort height toilet;BSC Toilet Transfer Details (indicate cue type and reason): Pt ambulated with assist to maintain  hip extension and assist to advance Lt LE  Toileting- Clothing Manipulation and Hygiene: Minimal assistance;Sit to/from stand Toileting - Clothing Manipulation Details (indicate cue type and reason): Pt initially states he can't perform, but with assist to initiate movement he was able to perform with assist  for thoroughness      Functional mobility during ADLs: Maximal assistance;+2 for physical assistance       Vision       Perception     Praxis      Cognition Arousal/Alertness: Awake/alert Behavior During Therapy: Impulsive;Flat affect   Area of Impairment: Orientation;Attention;Memory;Following commands;Safety/judgement;Awareness;Problem solving                [place]ancho Levels of Cognitive Functioning [place] Scales of Cognitive Functioning: Confused/appropriate Orientation Level: Disoriented to;Place;Situation Current Attention Level: Selective Memory: Decreased short-term memory Following Commands: Follows one step commands consistently Safety/Judgement: Decreased awareness of safety;Decreased awareness of deficits Awareness: Intellectual Problem Solving: Requires verbal cues;Requires tactile cues General Comments: Pt continues to progress.  He demonstrates selective attention today.  he was able to perofrm ADL tasks with music playing in the background.  He had mispronounced Ensure earlier in the session, and referrenced that x 2 appropriately later in session.    He requires cues to engage in ADL activities as he will ask for them to be done for him.  He was able to appropriately converse with therapists today.  He was concerned that he can't recall daughter's name, and asked use to try to find that out for him q        Exercises     Shoulder Instructions       General Comments      Pertinent Vitals/ Pain       Pain Assessment: No/denies pain  Home Living                                          Prior Functioning/Environment              Frequency  Min 2X/week        Progress Toward Goals  OT Goals(current goals can now be found in the care plan section)  Progress towards OT goals: Progressing toward goals     Plan Discharge plan remains appropriate    Co-evaluation    PT/OT/SLP Co-Evaluation/Treatment: Yes Reason for Co-Treatment: Complexity of the patient's impairments (multi-system involvement);For patient/therapist safety;Necessary to address cognition/behavior during functional activity;To address functional/ADL transfers   OT goals addressed during session: ADL's and self-care      AM-PAC PT "6 Clicks" Daily Activity     Outcome Measure   Help from another person  eating meals?: A Lot Help from another person taking care of personal grooming?: A Lot Help from another person toileting, which includes using toliet, bedpan, or urinal?: A Lot Help from another person bathing (including washing, rinsing, drying)?: A Lot Help from another person to put on and taking off regular upper body clothing?: A Lot Help from another person to put on and taking off regular lower body clothing?: A Lot 6 Click Score: 12    End of Session Equipment Utilized During Treatment: Gait belt  OT Visit Diagnosis: Unsteadiness on feet (R26.81);Cognitive communication deficit (R41.841)   Activity Tolerance Patient tolerated treatment well   Patient Left in chair;with call bell/phone within reach;with chair alarm set   Nurse Communication Mobility status        Time: 1610-9604 OT Time Calculation (min): 65 min  Charges: OT General Charges $OT Visit: 1 Visit OT Treatments $Self Care/Home Management : 23-37 mins  [employer], OTR/L [phone]    Jeani Hawking M 07/12/2017, 4:32 PM

## 2017-07-12 NOTE — Progress Notes (Signed)
Physical Therapy Treatment Patient Details Name: [name] MRN: [id number] DOB: 1965-04-24 Today's Date: 07/12/2017    History of Present Illness Patient is a 52 yo male, chronic alcoholic admitted after being found face down with evidence of head injury.  CT of head showed acute depressed Rt fronto-temporal skull fx with frontotemporal confluent acute hemorrhagic contusions, small Lt frontal lobe contusion, 6mm Rt > Lt midline shift with Rt uncal herniation, Rt to Lt ventricular entrapments as well ass R-IVH, SAH, and B-SDH.   He was also found to have questionable of cortical avulsion fracture of dorsal aspect of the Lt olecranon possible at insertion of triceps tendon.  Other significant PMH includes ETOH, CKD, HTN, AAA, DM, and h/o prior traumatic SAH     PT Comments    Pt is a solid Rancho VI and very participative in today's session.  He was able to selectively attend to ADL and gait tasks while Rolling stones were playing on the phone (he also likes the Beatles).  He was able to walk to the bathroom with two person max assist mostly controlling his trunk (both left lateral lean and tendency to flex at the hips).  Pt needed encouragement to do things he could do with his right hand.  I think most of his self care has been done for him so long at this point he has gotten used to people doing it for him.  When he tried a few new things today he was successful.  PT will continue to follow acutely and goals are due next session.    Follow Up Recommendations  SNF;Supervision/Assistance - 24 hour     Equipment Recommendations  Wheelchair (measurements PT);Wheelchair cushion (measurements PT);3in1 (PT)    Recommendations for Other Services   NA     Precautions / Restrictions Precautions Precautions: Fall Precaution Comments: in low bed now, use the alarm belt in the [facility]    Mobility  Bed Mobility Overal bed mobility: Needs Assistance Bed Mobility: Supine to Sit     Supine to  sit: Mod assist     General bed mobility comments: with mod cues, pt was able to move LEs off EOB with min A, and assist to lift trunk.  Multimodal cues to complete task and help initiate, some LE/UE and trunkal rotation ROM preformed before moving to side of the bed.   Transfers Overall transfer level: Needs assistance Equipment used: 2 person hand held assist Transfers: Sit to/from [employer] Sit to Stand: Max assist;+2 physical assistance Stand pivot transfers: Max assist;+2 physical assistance       General transfer comment: Pt initially states he cant stand due to being lazy and required assist to move into standing and assist to extend hips.  He was able to pivot feet with max A to maintain balance and to advance Lt LE   Ambulation/Gait Ambulation/Gait assistance: +2 physical assistance;Max assist Ambulation Distance (Feet): 10 Feet(x2 to and from the bathroom. ) Assistive device: 2 person hand held assist Gait Pattern/deviations: Step-through pattern;Scissoring;Trunk flexed;Narrow base of support;Leaning posteriorly     General Gait Details: Pt was able with significant two person assist to walk to the bathroom. He needs most help supporting trunk and encouraging hip extension (as he likes to flex and stick his buttocks out).  He leans towards his left weaker side and at times scissors needing assist to reposition his left foot wider.  He needs assist weight shifting and max encouragment to keep walking when he fatiuges.  Balance Overall balance assessment: Needs assistance Sitting-balance support: Feet supported;Single extremity supported Sitting balance-Leahy Scale: Poor Sitting balance - Comments: able to maintain EOB sitting statically with close min guard assist    Standing balance support: Bilateral upper extremity supported Standing balance-Leahy Scale: Poor Standing balance comment: Pt requires max A +2 due to Lt lateral lean                              Cognition Arousal/Alertness: Awake/alert Behavior During Therapy: Impulsive;Flat affect Overall Cognitive Status: Impaired/Different from baseline Area of Impairment: Orientation;Attention;Memory;Following commands;Safety/judgement;Awareness;Problem solving               Rancho Levels of Cognitive Functioning Rancho Los Amigos Scales of Cognitive Functioning: Confused/appropriate Orientation Level: Disoriented to;Place;Situation(seems to vaguely recognize staff) Current Attention Level: Selective Memory: Decreased short-term memory Following Commands: Follows one step commands consistently Safety/Judgement: Decreased awareness of safety;Decreased awareness of deficits Awareness: Intellectual Problem Solving: Requires verbal cues;Requires tactile cues General Comments: Pt continues to progress.  He demonstrates selective attention today.  he was able to perofrm ADL tasks with music playing in the background.  He had mispronounced Ensure earlier in the session, and referrenced that x 2 appropriately later in session.    He requires cues to engage in ADL activities as he will ask for them to be done for him.  He was able to appropriately converse with therapists today.  He was concerned that he can't recall daughter's name, and asked use to try to find that out for him.  He was able to give us a simple progression of [place] and recalled his wife, sons, and mother's names.        Exercises Other Exercises Other Exercises: PROM in bed to Bil elbows, shoulders and left wrist (pain with end ROM elbow flexion bil, and wrist ext L).  ROM to bil knees flexion and hip rotation, lower trunk rotation stretch bil with knees in flexion 3 times to each side.  Neck ROM stretch in sitting while in WC (side bend and rotation/sidebend combo).      General Comments General comments (skin integrity, edema, etc.): Used music as both a therapeutic tool and to see if  pt could selectively attend.       Pertinent Vitals/Pain Pain Assessment: Faces Faces Pain Scale: Hurts even more Pain Location: with end ROM bil elbows Pain Descriptors / Indicators: Grimacing;Guarding Pain Intervention(s): Limited activity within patient's tolerance;Monitored during session;Repositioned;Other (comment)(put elbow protector on R elbow)           PT Goals (current goals can now be found in the care plan section) Acute Rehab PT Goals Patient Stated Goal: to drink water today Progress towards PT goals: Progressing toward goals    Frequency    Min 2X/week      PT Plan Current plan remains appropriate    Co-evaluation PT/OT/SLP Co-Evaluation/Treatment: Yes Reason for Co-Treatment: Complexity of the patient's impairments (multi-system involvement);Necessary to address cognition/behavior during functional activity;For patient/therapist safety;To address functional/ADL transfers PT goals addressed during session: Mobility/safety with mobility;Balance;Strengthening/ROM;Proper use of DME OT goals addressed during session: ADL's and self-care      AM-PAC PT "6 Clicks" Daily Activity  Outcome Measure  Difficulty turning over in bed (including adjusting bedclothes, sheets and blankets)?: Unable Difficulty moving from lying on back to sitting on the side of the bed? : Unable Difficulty sitting down on and standing up from a chair with  arms (e.g., wheelchair, bedside commode, etc,.)?: Unable Help needed moving to and from a bed to chair (including a wheelchair)?: A Lot Help needed walking in hospital room?: A Lot Help needed climbing 3-5 steps with a railing? : Total 6 Click Score: 8    End of Session Equipment Utilized During Treatment: Gait belt Activity Tolerance: Patient limited by fatigue Patient left: in chair;with call bell/phone within reach;with chair alarm set;Other (comment)(with belt chair alarm.) Nurse Communication: Mobility status;Other (comment)(to  Hydrologist) PT Visit Diagnosis: Other abnormalities of gait and mobility (R26.89);Other symptoms and signs involving the nervous system (R29.898);Hemiplegia and hemiparesis Hemiplegia - Right/Left: Left Hemiplegia - dominant/non-dominant: Non-dominant Hemiplegia - caused by: Unspecified     Time: 9629-5284 PT Time Calculation (min) (ACUTE ONLY): 68 min  Charges:  $Gait Training: 8-22 mins $Therapeutic Exercise: 8-22 mins $Therapeutic Activity: 8-22 mins          Alando Colleran B. Martrice Apt, PT, DPT [phone]             07/12/2017, 5:10 PM

## 2017-07-13 LAB — GLUCOSE, CAPILLARY
GLUCOSE-CAPILLARY: 86 mg/dL (ref 65–99)
GLUCOSE-CAPILLARY: 104 mg/dL — AB (ref 65–99)
Glucose-Capillary: 99 mg/dL (ref 65–99)
Glucose-Capillary: 88 mg/dL (ref 65–99)

## 2017-07-13 NOTE — Progress Notes (Addendum)
Subjective: Patient reports "I feel ok"  Objective: Vital signs in last 24 hours: Temp:  [97.7 F (36.5 C)-98.5 F (36.9 C)] 98 F (36.7 C) (04/24 0422) Pulse Rate:  [99-108] 108 (04/24 0422) Resp:  [17-18] 18 (04/24 0422) BP: (98-118)/(64-80) 113/68 (04/24 0422) SpO2:  [97 %-99 %] 99 % (04/24 0422) Weight:  [78.5 kg (173 lb)] 78.5 kg (173 lb) (04/24 0344)  Intake/Output from previous day: 04/23 0701 - 04/24 0700 In: 240 [P.O.:240] Out: -  Intake/Output this shift: No intake/output data recorded.  Awake, supine in bed. Cooperates with exam today, c/o only right elbow soreness. Mild fluid swelling present, without erythema. Protective elbow pad in use. Moves left foot and flexes left knee (weak) today. No LUE movement. No longer requiring Posey Bed Enclosure.  Walked to bathroom with PT (assist of 2) yesterday.  Lab Results: No results for input(s): WBC, HGB, HCT, PLT in the last 72 hours. BMET No results for input(s): NA, K, CL, CO2, GLUCOSE, BUN, CREATININE, CALCIUM in the last 72 hours.  Studies/Results: No results found.  Assessment/Plan: Stable, unable to make decisions regarding safety or personal affairs  LOS: 61 days  Awaiting SNF placement   Georgiann Cockeroteat, Brian 07/13/2017, 7:05 AM   Patient walked to bathroom today!

## 2017-07-14 LAB — GLUCOSE, CAPILLARY
GLUCOSE-CAPILLARY: 128 mg/dL — AB (ref 65–99)
GLUCOSE-CAPILLARY: 125 mg/dL — AB (ref 65–99)
GLUCOSE-CAPILLARY: 114 mg/dL — AB (ref 65–99)
Glucose-Capillary: 123 mg/dL — ABNORMAL HIGH (ref 65–99)

## 2017-07-14 NOTE — Progress Notes (Signed)
  Speech Language Pathology Treatment: Cognitive-Linquistic  Patient Details Name: Christopher ReichertManuel Shaffer MRN: [id number] DOB: Apr 21, 1965 Today's Date: 07/14/2017 Time: [id number] SLP Time Calculation (min) (ACUTE ONLY): 20 min  Assessment / Plan / Recommendation Clinical Impression  The Montreal Cognitive Assessment (MoCA) was administered. Pt scored 15/30 (n=26+/30), indicating moderate cognitive impairment. Points were lost on the following subtests: executive functions, immediate and delayed recall, thought organization, orientation, visuoperception, naming, and attention. Pt did recall 3/5 unrelated words after delay, and was successful with calculations and abstract reasoning. Left neglect was apparent during attention task, an dpt identified 3/10 items on visuoperception task. Pt appeared less distractible today, with only 3 cues required to redirect to task. This is including the arrival of nursing tech midway through this assessment.   Pt continues to exhibit poor awareness of deficits and functional impact, as evidenced by his statement regarding his search for in-home care when he is discharged from the hospital. 24 hour supervision at SNF level of care is recommended.   HPI HPI: Patient is a 52 yo male, chronic alcoholic with right ICH, small SDH and skull fracture with right to left shift increased from 6 to 8mm per CT 05/14/17. Thrombocytopenia with likely poorly functioning platelets. Left hemiparesis with left neglect and loss of vision on left.      SLP Plan  Continue with current plan of care       Recommendations   Continued skilled ST intervention at next level of care to increase pt safety and independence and decrease caregiver burden.                 Oral Care Recommendations: Oral care BID Follow up Recommendations: 24 hour supervision/assistance;Skilled Nursing facility SLP Visit Diagnosis: Cognitive communication deficit (N56.213(R41.841) Plan: Continue with current plan  of care       GO               Suellen Durocher B. Murvin NatalBueche, [facility] SystemMSP, CCC-SLP Speech Language Pathologist [phone]  [name], [name] 07/14/2017, 11:45 AM

## 2017-07-14 NOTE — Progress Notes (Addendum)
Subjective: Patient reports "my left wrist is a little sore"  Objective: Vital signs in last 24 hours: Temp:  [97.9 F (36.6 C)-98.2 F (36.8 C)] 97.9 F (36.6 C) (04/25 0623) Pulse Rate:  [85-86] 85 (04/25 0623) Resp:  [18] 18 (04/25 0623) BP: (114-124)/(67-72) 124/67 (04/25 0623) SpO2:  [96 %-98 %] 98 % (04/25 0623) Weight:  [81.2 kg (179 lb)] 81.2 kg (179 lb) (04/25 0500)  Intake/Output from previous day: 04/24 0701 - 04/25 0700 In: 240 [P.O.:240] Out: -  Intake/Output this shift: No intake/output data recorded.  Awake, supine in bed. Reports only left wrist soreness this morning - inspection reveals no redness or swelling - reassured. No areas of skin breakdown heels or back/sacrum. Moves feet and right arm on command, turns himself in bed. Lacks attention span to make decisions pertaining to safety.  Lab Results: No results for input(s): WBC, HGB, HCT, PLT in the last 72 hours. BMET No results for input(s): NA, K, CL, CO2, GLUCOSE, BUN, CREATININE, CALCIUM in the last 72 hours.  Studies/Results: No results found.  Assessment/Plan: Stable   LOS: 62 days  Awaits SNF placement   Georgiann Cockeroteat, Brian 07/14/2017, 7:09 AM   Patient is stable.  Still awaiting placement.

## 2017-07-14 NOTE — Progress Notes (Signed)
Physical Therapy Treatment Patient Details Name: Laura Radilla MRN: [id number] DOB: 31-Jan-1966 Today's Date: 07/14/2017    History of Present Illness Patient is a 52 yo male, chronic alcoholic admitted after being found face down with evidence of head injury.  CT of head showed acute depressed Rt fronto-temporal skull fx with frontotemporal confluent acute hemorrhagic contusions, small Lt frontal lobe contusion, 6mm Rt > Lt midline shift with Rt uncal herniation, Rt to Lt ventricular entrapments as well ass R-IVH, SAH, and B-SDH.   He was also found to have questionable of cortical avulsion fracture of dorsal aspect of the Lt olecranon possible at insertion of triceps tendon.  Other significant PMH includes ETOH, CKD, HTN, AAA, DM, and h/o prior traumatic SAH     PT Comments    Goals re-assessed.  Pt continues to progress nicely and is at a Rancho VI.  He enjoys having people to talk to in his room.  He is trying to process how his body feels and why he needs to work with therapy to regain his independence as he has had his needs taken care of on a total assist level by staff for so long.  He follows commands, and expresses his needs and pain.  He is showing some slow carry over of tasks.  I think it is helping to have consistent therapists/faces coming to his room to help with treatment progression and carryover.  PT will continue to follow acutely.    Follow Up Recommendations  SNF;Supervision/Assistance - 24 hour     Equipment Recommendations  Wheelchair (measurements PT);Wheelchair cushion (measurements PT);3in1 (PT)    Recommendations for Other Services   NA     Precautions / Restrictions Precautions Precautions: Fall Precaution Comments: in low bed now, use the alarm belt in the [facility] Required Braces or Orthoses: Other Brace/Splint Other Brace/Splint: prosthetic R eye Restrictions Weight Bearing Restrictions: No    Mobility  Bed Mobility Overal bed mobility: Needs  Assistance Bed Mobility: Supine to Sit Rolling: +2 for physical assistance;Mod assist   Supine to sit: +2 for physical assistance;Mod assist     General bed mobility comments: Two person mod assist to move legs lightly, but trunk mostly up to sitting from supine.  Cues to stay forward with trunk once sitting EOB.   Transfers Overall transfer level: Needs assistance Equipment used: 2 person hand held assist Transfers: Sit to/from Stand Sit to Stand: +2 physical assistance;Max assist;Mod assist         General transfer comment: Two person mod to max assist to stand, pt does tend to shift left and posteriorly.  Ambulation/Gait Ambulation/Gait assistance: +2 physical assistance;Max assist Ambulation Distance (Feet): 6 Feet Assistive device: 2 person hand held assist Gait Pattern/deviations: Step-through pattern;Scissoring;Drifts right/left;Trunk flexed;Narrow base of support     General Gait Details: Gait not as good today as last session.  He was more scared and leaning/sitting on OT who was on his left posterior hip attempting to get him to weight shift and extend.        Balance Overall balance assessment: Needs assistance Sitting-balance support: Feet supported;No upper extremity supported Sitting balance-Leahy Scale: Poor Sitting balance - Comments: close supervision to min assist at trunk at times.  Pt responded well to cues to not lean/fall backwards and was able to correct with cues.  Posture pretty close to midline. Postural control: Posterior lean;Left lateral lean Standing balance support: Bilateral upper extremity supported;Single extremity supported Standing balance-Leahy Scale: Poor Standing balance comment: Max assist initially, but  with manual facilitation at left glute pt was able to get to mod assist in static standing with better weight shift and upright posture.  manual and tactile facilitation as well as verbal cues.  Pt did at one point get too fearful of  falling and we had to sit down and regroup.                             Cognition Arousal/Alertness: Awake/alert Behavior During Therapy: Flat affect;Impulsive(still a little flat, but starting to show some changes affec) Overall Cognitive Status: Impaired/Different from baseline Area of Impairment: Orientation;Attention;Memory;Following commands;Safety/judgement;Awareness;Problem solving;Rancho level               Rancho Levels of Cognitive Functioning Rancho Los Amigos Scales of Cognitive Functioning: Confused/appropriate Orientation Level: Disoriented to;Person(vague recognition of staff, reports in [place] (close)) Current Attention Level: Selective Memory: Decreased short-term memory Following Commands: Follows one step commands consistently Safety/Judgement: Decreased awareness of safety;Decreased awareness of deficits Awareness: Intellectual(close to emergent, knows he is off balance) Problem Solving: Requires verbal cues;Requires tactile cues General Comments: He is becoming more self aware, reporting his needs, starting to carryover some tasks, remembered my name within a 2 minute window, knows he is off balance, but due to poor input from sensory centers, has poor awareness of where he is in space, so when asked he reports falling opposite of where he is falling.  Starting to try to reason, although many discussions of why we cannot just take care of him and why we are making him start to do things for himself.              Pertinent Vitals/Pain Pain Assessment: Faces Faces Pain Scale: Hurts even more Pain Location: L UE Pain Descriptors / Indicators: Grimacing;Guarding Pain Intervention(s): Limited activity within patient's tolerance;Monitored during session;Repositioned           PT Goals (current goals can now be found in the care plan section) Acute Rehab PT Goals Patient Stated Goal: to find out what his daughter's name is and remember it. PT  Goal Formulation: Patient unable to participate in goal setting Time For Goal Achievement: 07/28/17 Potential to Achieve Goals: Fair Progress towards PT goals: Progressing toward goals(goal update preformed)    Frequency    Min 2X/week      PT Plan Current plan remains appropriate    Co-evaluation PT/OT/SLP Co-Evaluation/Treatment: Yes Reason for Co-Treatment: Complexity of the patient's impairments (multi-system involvement);Necessary to address cognition/behavior during functional activity;For patient/therapist safety;To address functional/ADL transfers PT goals addressed during session: Mobility/safety with mobility;Balance;Strengthening/ROM        AM-PAC PT "6 Clicks" Daily Activity  Outcome Measure  Difficulty turning over in bed (including adjusting bedclothes, sheets and blankets)?: Unable Difficulty moving from lying on back to sitting on the side of the bed? : Unable Difficulty sitting down on and standing up from a chair with arms (e.g., wheelchair, bedside commode, etc,.)?: Unable Help needed moving to and from a bed to chair (including a wheelchair)?: A Lot Help needed walking in hospital room?: Total Help needed climbing 3-5 steps with a railing? : Total 6 Click Score: 7    End of Session Equipment Utilized During Treatment: Gait belt Activity Tolerance: Patient tolerated treatment well Patient left: in chair;with call bell/phone within reach;with chair alarm set;Other (comment)(with belt alarm on)   PT Visit Diagnosis: Other abnormalities of gait and mobility (R26.89);Other symptoms and signs involving the nervous system (R29.898);Hemiplegia and  hemiparesis Hemiplegia - Right/Left: Left Hemiplegia - dominant/non-dominant: Non-dominant Hemiplegia - caused by: Unspecified     Time: [id number] PT Time Calculation (min) (ACUTE ONLY): 63 min  Charges:  $Therapeutic Activity: 23-37 mins          Dottie Vaquerano B. Twania Bujak, PT, DPT [phone]            07/14/2017,  3:30 PM

## 2017-07-15 ENCOUNTER — Other Ambulatory Visit: Payer: Self-pay

## 2017-07-15 LAB — GLUCOSE, CAPILLARY
GLUCOSE-CAPILLARY: 103 mg/dL — AB (ref 65–99)
GLUCOSE-CAPILLARY: 119 mg/dL — AB (ref 65–99)
GLUCOSE-CAPILLARY: 129 mg/dL — AB (ref 65–99)
Glucose-Capillary: 80 mg/dL (ref 65–99)

## 2017-07-15 MED ORDER — HALOPERIDOL LACTATE 5 MG/ML IJ SOLN
5.0000 mg | Freq: Four times a day (QID) | INTRAMUSCULAR | Status: DC | PRN
  Administered 2017-07-15 – 2017-07-25 (×11): 5 mg via INTRAMUSCULAR
  Filled 2017-07-15 (×11): qty 1

## 2017-07-15 NOTE — Clinical Social Work Note (Addendum)
[facility]-awaiting call back.  Lead HillBridget Lasheena Frieze, [place] [phone]

## 2017-07-15 NOTE — Progress Notes (Signed)
Occupational Therapy Progress Note (late entry)  Pt continues to make steady progress.   His conversation was more focused and appropriate today, although thought he was in [place].   He was able to recall my name spontaneously.   He demonstrates selective attention, and is able to perform ADLs with min - total A.  He was able to stand and bathe peri area with mod A +2.   He demonstrates beginning movement of Lt UE, and was able to retrieve a cylindrical object with mod facilitation.     Recommend SNF.     07/14/17 1624  OT Visit Information  Last OT Received On 07/15/17  Assistance Needed +2  PT/OT/SLP Co-Evaluation/Treatment Yes  Reason for Co-Treatment Complexity of the patient's impairments (multi-system involvement);For patient/therapist safety;To address functional/ADL transfers  OT goals addressed during session ADL's and self-care  History of Present Illness Patient is a 52 yo male, chronic alcoholic admitted after being found face down with evidence of head injury.  CT of head showed acute depressed Rt fronto-temporal skull fx with frontotemporal confluent acute hemorrhagic contusions, small Lt frontal lobe contusion, 6mm Rt > Lt midline shift with Rt uncal herniation, Rt to Lt ventricular entrapments as well ass R-IVH, SAH, and B-SDH.   He was also found to have questionable of cortical avulsion fracture of dorsal aspect of the Lt olecranon possible at insertion of triceps tendon.  Other significant PMH includes ETOH, CKD, HTN, AAA, DM, and h/o prior traumatic SAH   Precautions  Precautions Fall  Precaution Comments in low bed now, [facility]  Required Braces or Orthoses Other Brace/Splint  Other Brace/Splint prosthetic R eye  Pain Assessment  Pain Assessment Faces  Faces Pain Scale 6  Pain Location L UE  Pain Descriptors / Indicators Grimacing;Guarding  Pain Intervention(s) Monitored during session;Repositioned  Cognition  Arousal/Alertness Awake/alert   Behavior During Therapy Flat affect;Impulsive (still a little flat, but starting to show some changes affec)  Overall Cognitive Status Impaired/Different from baseline  Area of Impairment Orientation;Attention;Memory;Following commands;Safety/judgement;Awareness;Problem solving;Rancho level  Orientation Level Disoriented to;Person (vague recognition of staff, reports in [place] (close))  Current Attention Level Selective  Memory Decreased short-term memory  Following Commands Follows one step commands consistently  Safety/Judgement Decreased awareness of safety;Decreased awareness of deficits  Awareness Intellectual (close to emergent, knows he is off balance)  Problem Solving Requires verbal cues;Requires tactile cues  General Comments He is becoming more self aware, reporting his needs, starting to carryover some tasks, remembered my name within a 2 minute window, knows he is off balance, but due to poor input from sensory centers, has poor awareness of where he is in space, so when asked he reports falling opposite of where he is falling.  Starting to try to reason, although many discussions of why we cannot just take care of him and why we are making him start to do things for himself.   ADL  Overall ADL's  Needs assistance/impaired  Grooming Wash/dry hands;Wash/dry face;Set up;Supervision/safety;Sitting  Upper Body Dressing  Moderate assistance;Sitting  Upper Body Dressing Details (indicate cue type and reason) mod A to thread Lt UE through gown   Toileting- Clothing Manipulation and Hygiene Moderate assistance;+2 for safety/equipment;+2 for physical assistance;Sit to/from stand  Toileting - Clothing Manipulation Details (indicate cue type and reason) Pt requires mod A +2 to maintain standing balance while performing peri care   Functional mobility during ADLs Moderate assistance;+2 for physical assistance;Maximal assistance;+2 for safety/equipment  Bed Mobility  Overal bed mobility  Needs Assistance  Bed Mobility Supine to Sit  Rolling +2 for physical assistance;Mod assist  Supine to sit +2 for physical assistance;Mod assist  General bed mobility comments Two person mod assist to move legs lightly, but trunk mostly up to sitting from supine.  Cues to stay forward with trunk once sitting EOB.   Balance  Overall balance assessment Needs assistance  Sitting-balance support Feet supported;No upper extremity supported  Sitting balance-Leahy Scale Poor  Sitting balance - Comments close supervision to min assist at trunk at times.  Pt responded well to cues to not lean/fall backwards and was able to correct with cues.  Posture pretty close to midline.  Postural control Posterior lean;Left lateral lean  Standing balance support Bilateral upper extremity supported;Single extremity supported  Standing balance-Leahy Scale Poor  Standing balance comment Max assist initially, but with manual facilitation at left glute pt was able to get to mod assist in static standing with better weight shift and upright posture.  manual and tactile facilitation as well as verbal cues.  Pt did at one point get too fearful of falling and we had to sit down and regroup.   Restrictions  Weight Bearing Restrictions No  Rancho Levels of Cognitive Functioning  Rancho Los Amigos Scales of Cognitive Functioning VI  Transfers  Overall transfer level Needs assistance  Equipment used 2 person hand held assist  Transfers Sit to/from Stand  Sit to Stand +2 physical assistance;Max assist;Mod assist  General transfer comment Two person mod to max assist to stand, pt does tend to shift left and posteriorly.  Other Exercises  Other Exercises worked on facilitation of active reach with Lt UE - mod facilitation provided.  He is able to retrieve cylindrical object with mod A   OT - End of Session  Activity Tolerance Patient tolerated treatment well  Patient left in chair;with call bell/phone within reach;with chair  alarm set  Nurse Communication Mobility status  OT Assessment/Plan  OT Plan Discharge plan remains appropriate  OT Visit Diagnosis Unsteadiness on feet (R26.81);Cognitive communication deficit (R41.841)  OT Frequency (ACUTE ONLY) Min 2X/week  Follow Up Recommendations SNF  OT Equipment None recommended by OT  AM-PAC OT "6 Clicks" Daily Activity Outcome Measure  Help from another person eating meals? 2  Help from another person taking care of personal grooming? 2  Help from another person toileting, which includes using toliet, bedpan, or urinal? 2  Help from another person bathing (including washing, rinsing, drying)? 2  Help from another person to put on and taking off regular upper body clothing? 2  Help from another person to put on and taking off regular lower body clothing? 2  6 Click Score 12  ADL G Code Conversion CL  OT Goal Progression  Progress towards OT goals Progressing toward goals  Acute Rehab OT Goals  Patient Stated Goal to find out what his daughter's name is and remember it.  OT Time Calculation  OT Start Time (ACUTE ONLY) 1335  OT Stop Time (ACUTE ONLY) 1426  OT Time Calculation (min) 51 min  OT General Charges  $OT Visit 1 Visit  OT Treatments  $Neuromuscular Re-education 8-22 mins  [employer], OTR/L [phone]

## 2017-07-15 NOTE — Progress Notes (Addendum)
Subjective: Patient reports "I think I need some help cleaning up"  Objective: Vital signs in last 24 hours: Temp:  [98.1 F (36.7 C)-98.9 F (37.2 C)] 98.9 F (37.2 C) (04/26 0400) Pulse Rate:  [81-86] 82 (04/26 0400) Resp:  [18-22] 20 (04/26 0400) BP: (118-139)/(72-88) 139/88 (04/26 0400) SpO2:  [98 %-100 %] 98 % (04/26 0400) Weight:  [80.7 kg (178 lb)] 80.7 kg (178 lb) (04/26 0435)  Intake/Output from previous day: No intake/output data recorded. Intake/Output this shift: No intake/output data recorded.  Awake, requesting assistance with cleaning after BM. Inspection reveal no evidence of BM or void. Skin integrity is good, without breakdown or sign of irritation. Moves left leg (weak) today. Not moving left arm.   Lab Results: No results for input(s): WBC, HGB, HCT, PLT in the last 72 hours. BMET No results for input(s): NA, K, CL, CO2, GLUCOSE, BUN, CREATININE, CALCIUM in the last 72 hours.  Studies/Results: No results found.  Assessment/Plan: stable  LOS: 63 days  Awaits SNF placement   Georgiann Cockeroteat, Brian 07/15/2017, 7:26 AM    Patient stable.  Awaiting placement.

## 2017-07-15 NOTE — Care Management Note (Signed)
Case Management Note  Patient Details  Name: Carlyn ReichertManuel Shrewsbury MRN: [id number] Date of Birth: 1965/07/08  Subjective/Objective:                    Action/Plan: Continues on the DTP list. CM following.  Expected Discharge Date:  05/20/17               Expected Discharge Plan:  Skilled Nursing Facility  In-House Referral:  Clinical Social Work  Discharge planning Services  CM Consult  Post Acute Care Choice:  NA Choice offered to:  NA  DME Arranged:  N/A DME Agency:  NA  HH Arranged:  NA HH Agency:  NA  Status of Service:  In process, will continue to follow  If discussed at Long Length of Stay Meetings, dates discussed:    Additional Comments:  [name] F Kaari Zeigler, RN 07/15/2017, 1:58 PM

## 2017-07-15 NOTE — Progress Notes (Signed)
Patient ID: Carlyn ReichertManuel Brannock, male   DOB: 28-Apr-1965, 52 y.o.   MRN: [id number] Report that pt is climbing out of bed, concern for injury when his legs fall between railing and mattress. Confusion and hallucinations persist, not responding to Zyprexa this afternoon. Per Dr. Venetia MaxonStern, Haldol 5mg  IM q6hrs prn. Posey Bed Enclosure if medication does not calm pt's symptoms. Orders for both entered.

## 2017-07-16 LAB — GLUCOSE, CAPILLARY
GLUCOSE-CAPILLARY: 90 mg/dL (ref 65–99)
GLUCOSE-CAPILLARY: 96 mg/dL (ref 65–99)
Glucose-Capillary: 124 mg/dL — ABNORMAL HIGH (ref 65–99)
Glucose-Capillary: 134 mg/dL — ABNORMAL HIGH (ref 65–99)

## 2017-07-16 LAB — CREATININE, SERUM: Creatinine, Ser: 0.49 mg/dL — ABNORMAL LOW (ref 0.61–1.24)

## 2017-07-16 NOTE — Progress Notes (Signed)
Subjective: The patient is alert and pleasant.  Objective: Vital signs in last 24 hours: Temp:  [97.7 F (36.5 C)-98.5 F (36.9 C)] 97.9 F (36.6 C) (04/27 0447) Pulse Rate:  [69-88] 69 (04/27 0447) Resp:  [16-20] 18 (04/27 0447) BP: (109-138)/(66-89) 109/66 (04/27 0447) SpO2:  [98 %-99 %] 98 % (04/27 0447) Weight:  [80.7 kg (178 lb)] 80.7 kg (178 lb) (04/27 0356) Estimated body mass index is 24.14 kg/m as calculated from the following:   Height as of this encounter: 6' (1.829 m).   Weight as of this encounter: 80.7 kg (178 lb).   Intake/Output from previous day: 04/26 0701 - 04/27 0700 In: 360 [P.O.:360] Out: 1 [Urine:1] Intake/Output this shift: No intake/output data recorded.  Physical exam patient is alert and pleasant.  He is moving all 4 extremities well.  Lab Results: No results for input(s): WBC, HGB, HCT, PLT in the last 72 hours. BMET Recent Labs    07/16/17 0540  CREATININE 0.49*    Studies/Results: No results found.  Assessment/Plan: Stable  LOS: 64 days     Cristi Loron 07/16/2017, 9:19 AM

## 2017-07-16 NOTE — Progress Notes (Signed)
Patient continues to try and get out of bed. RN has found patient hanging halfway out of the bed x2. PT is not easily re-directed. PT continues to think he is at home and needs to go to work. Awaiting Veil bed that was ordered for Patient

## 2017-07-16 NOTE — Progress Notes (Signed)
Pt still trying to get up out of bed, and uncorrectable with redirection.  Nurse is sitting in patients room due to safety concern.  Every medication available has been given to patient with no success and a non violent veil bed has been ordered for patient.  Portable equipment just called to say we do not have one available however, they would order one tommorow from an outside vendor.  Will continue to monitor patient closely.

## 2017-07-17 LAB — GLUCOSE, CAPILLARY
GLUCOSE-CAPILLARY: 99 mg/dL (ref 65–99)
Glucose-Capillary: 106 mg/dL — ABNORMAL HIGH (ref 65–99)
Glucose-Capillary: 123 mg/dL — ABNORMAL HIGH (ref 65–99)
Glucose-Capillary: 121 mg/dL — ABNORMAL HIGH (ref 65–99)

## 2017-07-17 NOTE — Progress Notes (Signed)
Subjective: The patient continues to reside at [facility].  He has multiple complaints.  Objective: Vital signs in last 24 hours: Temp:  [97.7 F (36.5 C)-98.5 F (36.9 C)] 98.3 F (36.8 C) (04/28 0407) Pulse Rate:  [77-123] 123 (04/28 0407) Resp:  [18-20] 20 (04/28 0407) BP: (111-148)/(69-104) 148/104 (04/28 0407) SpO2:  [97 %-100 %] 100 % (04/28 0407) Estimated body mass index is 24.14 kg/m as calculated from the following:   Height as of this encounter: 6' (1.829 m).   Weight as of this encounter: 80.7 kg (178 lb).   Intake/Output from previous day: 04/27 0701 - 04/28 0700 In: 240 [P.O.:240] Out: -  Intake/Output this shift: Total I/O In: -  Out: 250 [Urine:250]  Physical exam the patient is alert.  He is moving all 4 extremities well.  His speech is normal.  Lab Results: No results for input(s): WBC, HGB, HCT, PLT in the last 72 hours. BMET Recent Labs    07/16/17 0540  CREATININE 0.49*    Studies/Results: No results found.  Assessment/Plan: Stable, awaiting placement.  LOS: 65 days     Cristi Loron 07/17/2017, 10:21 AM

## 2017-07-17 NOTE — Progress Notes (Signed)
Patient's agitation continues to get worse though out the shift.  Patient has become more verbal & physically aggressive towards staff members.  Patient continues to try to get out of bed.  Still awaiting Enclosure bed for patient

## 2017-07-18 LAB — GLUCOSE, CAPILLARY
GLUCOSE-CAPILLARY: 98 mg/dL (ref 65–99)
GLUCOSE-CAPILLARY: 128 mg/dL — AB (ref 65–99)
Glucose-Capillary: 102 mg/dL — ABNORMAL HIGH (ref 65–99)
Glucose-Capillary: 117 mg/dL — ABNORMAL HIGH (ref 65–99)
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
## 2017-07-18 NOTE — Care Management Note (Signed)
Case Management Note  Patient Details  Name: [name] MRN: [id number] Date of Birth: January 14, 1966  Subjective/Objective:                    Action/Plan: Pt continues on the DTP list for SNF placement. CM following.   Expected Discharge Date:  05/20/17               Expected Discharge Plan:  Skilled Nursing Facility  In-House Referral:  Clinical Social Work  Discharge planning Services  CM Consult  Post Acute Care Choice:  NA Choice offered to:  NA  DME Arranged:  N/A DME Agency:  NA  HH Arranged:  NA HH Agency:  NA  Status of Service:  In process, will continue to follow  If discussed at Long Length of Stay Meetings, dates discussed:    Additional Comments:  Kermit Balo, RN 07/18/2017, 12:24 PM

## 2017-07-18 NOTE — Progress Notes (Addendum)
Subjective: Patient reports sleeping quietly  Objective: Vital signs in last 24 hours: Temp:  [97.8 F (36.6 C)-98.3 F (36.8 C)] 97.8 F (36.6 C) (04/29 0438) Pulse Rate:  [88-106] 93 (04/29 0438) Resp:  [13-18] 18 (04/29 0438) BP: (118-145)/(72-89) 138/89 (04/29 0438) SpO2:  [99 %-100 %] 100 % (04/29 0438) Weight:  [79.4 kg (175 lb)] 79.4 kg (175 lb) (04/29 0438)  Intake/Output from previous day: 04/28 0701 - 04/29 0700 In: 240 [P.O.:240] Out: 250 [Urine:250] Intake/Output this shift: No intake/output data recorded.  Rouses, moving right arm and leg to voice. Given his excitable state over last two days, I did not wake pt as he was resting quietly in no distress.   Lab Results: No results for input(s): WBC, HGB, HCT, PLT in the last 72 hours. BMET Recent Labs    07/16/17 0540  CREATININE 0.49*    Studies/Results: No results found.  Assessment/Plan: stable  LOS: 66 days  Awaiting SNF placement.    Georgiann Cocker 07/18/2017, 7:26 AM   Patient is stable.  He is now in his third month of his hospitalization.  He is awaiting placement.

## 2017-07-19 LAB — GLUCOSE, CAPILLARY
GLUCOSE-CAPILLARY: 122 mg/dL — AB (ref 65–99)
GLUCOSE-CAPILLARY: 194 mg/dL — AB (ref 65–99)
Glucose-Capillary: 132 mg/dL — ABNORMAL HIGH (ref 65–99)
Glucose-Capillary: 124 mg/dL — ABNORMAL HIGH (ref 65–99)
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
## 2017-07-19 NOTE — Progress Notes (Addendum)
Subjective: Patient reports "I'm in [facility]. I came here to eat breakfast but I've got to finish my shift"  Objective: Vital signs in last 24 hours: Temp:  [98.2 F (36.8 C)-99 F (37.2 C)] 98.2 F (36.8 C) (04/30 0434) Pulse Rate:  [65-113] 101 (04/30 0434) Resp:  [18-20] 20 (04/30 0434) BP: (105-161)/(68-96) 140/80 (04/30 0434) SpO2:  [98 %-99 %] 99 % (04/30 0434)  Intake/Output from previous day: 04/29 0701 - 04/30 0700 In: -  Out: 220 [Urine:120; Stool:100] Intake/Output this shift: No intake/output data recorded.  Awake, with both legs overhanging upright bedrails. He reports good BM this morning. Reports left hand soreness with manipulation. RUE remains flacid. Moves BLE (LLE weak). Remains confused and unable to make decisions for safety or self care. No skin irritation observed during cleaning and positioning.   Lab Results: No results for input(s): WBC, HGB, HCT, PLT in the last 72 hours. BMET No results for input(s): NA, K, CL, CO2, GLUCOSE, BUN, CREATININE, CALCIUM in the last 72 hours.  Studies/Results: No results found.  Assessment/Plan: stable  LOS: 67 days  Pt cleaned, chux replaced; pt repositioned in bed. Awaiting SNF.   Georgiann Cocker 07/19/2017, 7:53 AM    Unchanged.
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
## 2017-07-20 LAB — GLUCOSE, CAPILLARY
GLUCOSE-CAPILLARY: 128 mg/dL — AB (ref 65–99)
Glucose-Capillary: 82 mg/dL (ref 65–99)
Glucose-Capillary: 131 mg/dL — ABNORMAL HIGH (ref 65–99)
Glucose-Capillary: 125 mg/dL — ABNORMAL HIGH (ref 65–99)

## 2017-07-20 NOTE — Progress Notes (Signed)
Subjective: Patient reports "I'm in [facility]."  Objective: Vital signs in last 24 hours: Temp:  [97.2 F (36.2 C)-98.1 F (36.7 C)] 97.5 F (36.4 C) (04/30 2357) Pulse Rate:  [99-109] 108 (04/30 2357) Resp:  [19-20] 19 (04/30 2357) BP: (120-148)/(73-100) 148/100 (04/30 2357) SpO2:  [98 %-99 %] 98 % (04/30 2357)  Intake/Output from previous day: 04/30 0701 - 05/01 0700 In: 1080 [P.O.:1080] Out: 275 [Urine:275] Intake/Output this shift: No intake/output data recorded.  Physical Exam: No change  Lab Results: No results for input(s): WBC, HGB, HCT, PLT in the last 72 hours. BMET No results for input(s): NA, K, CL, CO2, GLUCOSE, BUN, CREATININE, CALCIUM in the last 72 hours.  Studies/Results: No results found.  Assessment/Plan: Patient is stable.  Custodial care continues.  Placement?    LOS: 68 days    Damione Robideau D, MD 07/20/2017, 8:50 AM

## 2017-07-20 NOTE — Progress Notes (Signed)
Physical Therapy Treatment Patient Details Name: [name] MRN: [id number] DOB: [birth year] Today's Date: 07/20/2017    History of Present Illness Patient is a 52 yo male, chronic alcoholic admitted after being found face down with evidence of head injury.  CT of head showed acute depressed Rt fronto-temporal skull fx with frontotemporal confluent acute hemorrhagic contusions, small Lt frontal lobe contusion, 6mm Rt > Lt midline shift with Rt uncal herniation, Rt to Lt ventricular entrapments as well ass R-IVH, SAH, and B-SDH.   He was also found to have questionable of cortical avulsion fracture of dorsal aspect of the Lt olecranon possible at insertion of triceps tendon.  Other significant PMH includes ETOH, CKD, HTN, AAA, DM, and h/o prior traumatic SAH     PT Comments    Pt was able to transfer OOB to [facility] with one person assist using the steady standing frame from elevated bed.  He continues to perseverate on getting a PTO form for work today.  I gently explained to him that he no longer has a job and that it would be physically difficult for him to work given his current deficits and reoriented him to the hospital and his situation.  He was able to remember my name after telling him at the beginning of the session to the end of the session nearly and hour later.  He could not, however, remember my name from yesterday.    Follow Up Recommendations  SNF;Supervision/Assistance - 24 hour     Equipment Recommendations  Wheelchair (measurements PT);Wheelchair cushion (measurements PT);3in1 (PT)    Recommendations for Other Services   NA     Precautions / Restrictions Precautions Precautions: Fall Precaution Comments: in low bed now, use the alarm belt in the [facility] Required Braces or Orthoses: Other Brace/Splint Other Brace/Splint: prosthetic R eye    Mobility  Bed Mobility Overal bed mobility: Needs Assistance Bed Mobility: Rolling;Sidelying to Sit Rolling: Mod assist Sidelying to  sit: Mod assist       General bed mobility comments: Mod assist to initiate left knee flexion, and turning of trunk and pelvis to get into right side lying.  Assist to progress both legs over EOB and initiate movement of trunk to EOB.  Pt does start to assist with right hand once 1/4 of the way up.   Transfers Overall transfer level: Needs assistance   Transfers: Sit to/from Stand;Stand Pivot Transfers Sit to Stand: Mod assist;From elevated surface Stand pivot transfers: (seated on steady)       General transfer comment: Mod assist to support trunk to get to standing in steady standing frame.  Assist needed to position left hand on pull bar and pt pulling mostly with right hand.  Pt seated for transfer and min assist to stand from elevated seat, mod assist to control descent down to lower WC.           Balance Overall balance assessment: Needs assistance Sitting-balance support: Feet supported;No upper extremity supported Sitting balance-Leahy Scale: Fair Sitting balance - Comments: supervision in static sitting EOB.    Standing balance support: Bilateral upper extremity supported Standing balance-Leahy Scale: Poor Standing balance comment: needs external support                             Cognition Arousal/Alertness: Awake/alert Behavior During Therapy: Flat affect;Impulsive Overall Cognitive Status: Impaired/Different from baseline Area of Impairment: Orientation;Attention;Memory;Following commands;Safety/judgement;Awareness;Problem solving;Rancho level  Rancho Levels of Cognitive Functioning [place]igos Scales of Cognitive Functioning: Confused/appropriate Orientation Level: Disoriented to;Time;Place;Situation Current Attention Level: Selective Memory: Decreased recall of precautions;Decreased short-term memory Following Commands: Follows one step commands consistently;Follows multi-step commands inconsistently Safety/Judgement:  Decreased awareness of safety;Decreased awareness of deficits Awareness: Intellectual Problem Solving: Difficulty sequencing;Requires verbal cues;Requires tactile cues General Comments: Still unable to relate his weakness to his functional abilities.  I tried to talk to him about his left side being weak and how this may impair him working currently and he reported back that his job does not require heavy lifting, so he is ok.              Pertinent Vitals/Pain Pain Assessment: No/denies pain       Prior Function            PT Goals (current goals can now be found in the care plan section) Acute Rehab PT Goals Patient Stated Goal: Pt wants PTO papers for work.  Progress towards PT goals: Progressing toward goals    Frequency    Min 2X/week      PT Plan Current plan remains appropriate       AM-PAC PT "6 Clicks" Daily Activity  Outcome Measure  Difficulty turning over in bed (including adjusting bedclothes, sheets and blankets)?: A Lot Difficulty moving from lying on back to sitting on the side of the bed? : A Lot Difficulty sitting down on and standing up from a chair with arms (e.g., wheelchair, bedside commode, etc,.)?: A Lot Help needed moving to and from a bed to chair (including a wheelchair)?: A Lot Help needed walking in hospital room?: Total Help needed climbing 3-5 steps with a railing? : Total 6 Click Score: 10    End of Session   Activity Tolerance: Patient tolerated treatment well Patient left: in chair;with chair alarm set(with alarm belt) Nurse Communication: Mobility status;Need for lift equipment;Other (comment)(to Hydrologist) PT Visit Diagnosis: Other abnormalities of gait and mobility (R26.89);Other symptoms and signs involving the nervous system (R29.898);Hemiplegia and hemiparesis Hemiplegia - Right/Left: Left Hemiplegia - dominant/non-dominant: Non-dominant Hemiplegia - caused by: Unspecified     Time: 1610-9604 PT Time Calculation (min)  (ACUTE ONLY): 54 min  Charges:  $Therapeutic Activity: 53-67 mins          Donevan Biller B. Floy Angert, PT, DPT [phone]            07/20/2017, 12:41 PM

## 2017-07-20 NOTE — Progress Notes (Signed)
CSW continuing to follow patient for discharge needs.  Genowefa Morga LCSW [phone]  

## 2017-07-20 NOTE — Care Management Note (Signed)
Case Management Note  Patient Details  Name: [name] MRN: [id number] Date of Birth: 06/07/1965  Subjective/Objective:                    Action/Plan: Pt continues on the DTP list for SNF placement. CM following.  Expected Discharge Date:  05/20/17               Expected Discharge Plan:  Skilled Nursing Facility  In-House Referral:  Clinical Social Work  Discharge planning Services  CM Consult  Post Acute Care Choice:  NA Choice offered to:  NA  DME Arranged:  N/A DME Agency:  NA  HH Arranged:  NA HH Agency:  NA  Status of Service:  In process, will continue to follow  If discussed at Long Length of Stay Meetings, dates discussed:    Additional Comments:  Kermit Balo, RN 07/20/2017, 12:40 PM

## 2017-07-21 LAB — GLUCOSE, CAPILLARY
GLUCOSE-CAPILLARY: 86 mg/dL (ref 65–99)
Glucose-Capillary: 100 mg/dL — ABNORMAL HIGH (ref 65–99)
Glucose-Capillary: 135 mg/dL — ABNORMAL HIGH (ref 65–99)
Glucose-Capillary: 127 mg/dL — ABNORMAL HIGH (ref 65–99)

## 2017-07-21 LAB — MRSA PCR SCREENING: MRSA by PCR: NEGATIVE

## 2017-07-21 NOTE — Progress Notes (Signed)
Pt was found on floor mat beside his bed when alarm went off. NT and myself assisted pt back to bed and assessed with no injuries found. VSS. No complaints from pt. MDs  on call service notified via Brittney. Will relay to day nurse to call family in the AM since there were no obvious injuries noted. Bed alarm remains activated. Bed in lowest position. Nurse remains nearby for extra safety.

## 2017-07-21 NOTE — Progress Notes (Signed)
Physical Therapy Treatment Patient Details Name: [name] MRN: [id number] DOB: 02/13/1966 Today's Date: 07/21/2017    History of Present Illness Patient is a 52 yo male, chronic alcoholic admitted after being found face down with evidence of head injury.  CT of head showed acute depressed Rt fronto-temporal skull fx with frontotemporal confluent acute hemorrhagic contusions, small Lt frontal lobe contusion, 6mm Rt > Lt midline shift with Rt uncal herniation, Rt to Lt ventricular entrapments as well ass R-IVH, SAH, and B-SDH.   He was also found to have questionable of cortical avulsion fracture of dorsal aspect of the Lt olecranon possible at insertion of triceps tendon.  Other significant PMH includes ETOH, CKD, HTN, AAA, DM, and h/o prior traumatic SAH     PT Comments    Pt was able to walk with two person assist to the bathroom, but after toileting, fatigued quickly and had to pull chair up behind [place] so he could sit and rest before making it back to the bed.  He continues to make physical gains and shows new cognitive abilities near every session.  He remains appropriate for acute therapies and f/u therapies at discharge.  Follow Up Recommendations  SNF;Supervision/Assistance - 24 hour     Equipment Recommendations  Wheelchair (measurements PT);Wheelchair cushion (measurements PT);3in1 (PT)    Recommendations for Other Services   NA     Precautions / Restrictions Precautions Precautions: Fall Precaution Comments: back in enclosure bed, use the alarm belt in the [facility], may need to add alarm pad as well.  Required Braces or Orthoses: Other Brace/Splint Other Brace/Splint: prosthetic R eye    Mobility  Bed Mobility Overal bed mobility: Needs Assistance Bed Mobility: Rolling;Sidelying to Sit Rolling: Mod assist Sidelying to sit: Mod assist       General bed mobility comments: Mod assist to initiate left knee flexion, and turning of trunk and pelvis to get into right side  lying.  Assist to progress both legs over EOB and initiate movement of trunk to EOB.  Pt does start to assist with right hand once 1/4 of the way up.   Transfers Overall transfer level: Needs assistance Equipment used: 2 person hand held assist Transfers: Sit to/from Stand Sit to Stand: Mod assist;From elevated surface;+2 physical assistance         General transfer comment: Mod assist to support trunk and initiate anterior forward motion over his feet.  therapist positioned one under his right shoulder and one more left and posterior at his trunk and hip  Ambulation/Gait Ambulation/Gait assistance: +2 physical assistance;Max assist Ambulation Distance (Feet): 10 Feet(10', 5', 5' with seated rest break between the last two 5') Assistive device: 2 person hand held assist Gait Pattern/deviations: Step-through pattern;Scissoring;Drifts right/left;Trunk flexed;Narrow base of support     General Gait Details: Pt able to ambulate to the bathroom with two person assist with the last 5' into the bathroom the best walking with trunk and hips extended that we have seen yet from [facility].  He fatigued quickly after the walk and had to sit before going the last 5' to the bed.            Balance Overall balance assessment: Needs assistance Sitting-balance support: Feet supported;No upper extremity supported Sitting balance-Leahy Scale: Fair Sitting balance - Comments: supervision in static sitting EOB.    Standing balance support: Bilateral upper extremity supported Standing balance-Leahy Scale: Poor Standing balance comment: needs external support fluctuates from poor to zero  Cognition Arousal/Alertness: Awake/alert Behavior During Therapy: Flat affect;Impulsive Overall Cognitive Status: Impaired/Different from baseline Area of Impairment: Orientation;Attention;Memory;Following commands;Safety/judgement;Awareness;Problem solving;Rancho level                Rancho Levels of Cognitive Functioning Rancho Mirant Scales of Cognitive Functioning: Confused/appropriate Orientation Level: Disoriented to;Time;Place;Situation Current Attention Level: Selective Memory: Decreased recall of precautions;Decreased short-term memory Following Commands: Follows one step commands consistently;Follows multi-step commands inconsistently Safety/Judgement: Decreased awareness of safety;Decreased awareness of deficits Awareness: Intellectual Problem Solving: Difficulty sequencing;Requires verbal cues;Requires tactile cues General Comments: Pt continues to perseverate on his PTO papers.  He was able to recall my name from yesterday and could relay that when he tried to get out of the chair on his own his legs were too weak, and demonstrates remourse for getting up unassisted earlier today.              Pertinent Vitals/Pain Pain Assessment: Faces Faces Pain Scale: Hurts little more Pain Location: R shoulder, R elbow, L wrist Pain Descriptors / Indicators: Grimacing;Guarding Pain Intervention(s): Limited activity within patient's tolerance;Monitored during session;Repositioned           PT Goals (current goals can now be found in the care plan section) Acute Rehab PT Goals Patient Stated Goal: Pt wants PTO papers for work.  Progress towards PT goals: Progressing toward goals    Frequency    Min 2X/week      PT Plan Current plan remains appropriate       AM-PAC PT "6 Clicks" Daily Activity  Outcome Measure  Difficulty turning over in bed (including adjusting bedclothes, sheets and blankets)?: A Lot Difficulty moving from lying on back to sitting on the side of the bed? : A Lot Difficulty sitting down on and standing up from a chair with arms (e.g., wheelchair, bedside commode, etc,.)?: A Lot Help needed moving to and from a bed to chair (including a wheelchair)?: A Lot Help needed walking in hospital room?: Total Help needed  climbing 3-5 steps with a railing? : Total 6 Click Score: 10    End of Session Equipment Utilized During Treatment: Gait belt Activity Tolerance: Patient limited by fatigue Patient left: in bed;with restraints reapplied(in enclosure bed.) Nurse Communication: Mobility status;Need for lift equipment;Other (comment)(to Hydrologist) PT Visit Diagnosis: Other abnormalities of gait and mobility (R26.89);Other symptoms and signs involving the nervous system (R29.898);Hemiplegia and hemiparesis Hemiplegia - Right/Left: Left Hemiplegia - dominant/non-dominant: Non-dominant Hemiplegia - caused by: Unspecified     Time: 9147-8295 PT Time Calculation (min) (ACUTE ONLY): 59 min  Charges:  $Gait Training: 8-22 mins $Therapeutic Activity: 8-22 mins          Langston [place] B. Montez Cuda, PT, DPT [phone]            07/21/2017, 11:28 PM

## 2017-07-21 NOTE — Progress Notes (Signed)
Patient placed in wheelchair this AM while waiting for floor to be cleaned. Veiled bed outside room,  Low bed removed from room. Per night shift RN, floor was requested to be cleaned last night after fall due to thickener covering floor. NT requested the floor to be cleaned this AM after getting patient into chair, alarm belt applied. After EVS cleaned the floor, secretary notified of patient request for ensure. NT Molly walked by the room shortly afterward and found patient on the floor. Per Kirt Boys, the patient was sitting on the floor when she arrived. Alarm plugged in, did not go off. Patient assessed for injury, no injury noted. MD notified.

## 2017-07-21 NOTE — Progress Notes (Signed)
  Speech Language Pathology Treatment: Cognitive-Linquistic  Patient Details Name: [name] MRN: [id number] DOB: 1966/01/21 Today's Date: 07/21/2017 Time: 4098-1191 SLP Time Calculation (min) (ACUTE ONLY): 45 min  Assessment / Plan / Recommendation Clinical Impression  Back in Posey bed after fall.  Pt immediately stated "I made a mistake," recalling that he fell.  Pt demonstrates improved short-term recall today - recalled clinician's name after 45 min session, retained task instructions over 2-4 minute intervals.  Pt able to write name.  He generated words when given target categories, and demonstrated elements of working memory when he maintained task instructions, wrote lists of ten items, and identified how many items remained. He was quiet and focused during writing tasks.  After working with paper/pen for twenty minutes, pt was able to recall 3/4 prior targeted activities.  He was calm and thoughtful, asking questions about God, and easily redirectable today.  Remains RL VI.  Provided with binder/paper/pen and two "homework" assignments.  Continue SLP.    HPI HPI: Patient is a 52 yo male, chronic alcoholic with right ICH, small SDH and skull fracture with right to left shift increased from 6 to 8mm per CT 05/14/17. Thrombocytopenia with likely poorly functioning platelets. Left hemiparesis with left neglect and loss of vision on left.      SLP Plan  Continue with current plan of care       Recommendations                   SLP Visit Diagnosis: Cognitive communication deficit (Y78.295) Plan: Continue with current plan of care       GO                [name] 07/21/2017, 2:23 PM  Shakeia Krus L. Samson Frederic, [place] CCC/SLP Pager [phone]

## 2017-07-21 NOTE — Progress Notes (Addendum)
Subjective: Patient reports resting quietly  Objective: Vital signs in last 24 hours: Temp:  [97.7 F (36.5 C)-98 F (36.7 C)] 97.7 F (36.5 C) (05/02 0307) Pulse Rate:  [101-106] 106 (05/01 1200) Resp:  [16-18] 16 (05/01 1200) BP: (127-139)/(85-96) 139/85 (05/02 0307) SpO2:  [98 %-99 %] 99 % (05/01 1200)  Intake/Output from previous day: 05/01 0701 - 05/02 0700 In: 480 [P.O.:480] Out: -  Intake/Output this shift: No intake/output data recorded.  Sleeping quietly, without evidence of discomfort or distress. Found in floor last night without injury. He remains unable to make decisions regarding safety or personal care.   Lab Results: No results for input(s): WBC, HGB, HCT, PLT in the last 72 hours. BMET No results for input(s): NA, K, CL, CO2, GLUCOSE, BUN, CREATININE, CALCIUM in the last 72 hours.  Studies/Results: No results found.  Assessment/Plan: stable  LOS: 69 days  Continue care, awaiting SNF   P[place]at, Brian 07/21/2017, 7:08 AM   No change.

## 2017-07-21 NOTE — Progress Notes (Signed)
Occupational Therapy Treatment Patient Details Name: [name] MRN: [id number] DOB: June 21, 1965 Today's Date: 07/21/2017    History of present illness Patient is a 51 yo male, chronic alcoholic admitted after being found face down with evidence of head injury.  CT of head showed acute depressed Rt fronto-temporal skull fx with frontotemporal confluent acute hemorrhagic contusions, small Lt frontal lobe contusion, 78m Rt > Lt midline shift with Rt uncal herniation, Rt to Lt ventricular entrapments as well ass R-IVH, SAH, and B-SDH.   He was also found to have questionable of cortical avulsion fracture of dorsal aspect of the Lt olecranon possible at insertion of triceps tendon.  Other significant PMH includes ETOH, CKD, HTN, AAA, DM, and h/o prior traumatic SAH    OT comments  Pt continues to demonstrate steady improvements.  He continues to demonstrates increased ability to attend to tasks, and today demonstrates occasional periods of alternating attention.  He spontaneously attempted to use Lt UE/hand as a stabilizer support during ADL tasks today.  He donned shirt with mod A, and ambulated to BR with max A +2.    He continues with periods of confusion and confabulation - demonstrates behaviors consistent with Ranchos level VI  Follow Up Recommendations  SNF    Equipment Recommendations  None recommended by OT    Recommendations for Other Services Rehab consult    Precautions / Restrictions Precautions Precautions: Fall Precaution Comments: Posey bed.  5/2./19 - pt with fall from bed and from w/c per nsg  Required Braces or Orthoses: Other Brace/Splint Other Brace/Splint: prosthetic R eye Restrictions Weight Bearing Restrictions: No       Mobility Bed Mobility Overal bed mobility: Needs Assistance Bed Mobility: Rolling;Sidelying to Sit Rolling: Mod assist Sidelying to sit: Mod assist       General bed mobility comments: step by step cues for sequencing and problem  solving.  Assist to move LEs off bed and to lift trunk   Transfers Overall transfer level: Needs assistance Equipment used: 2 person hand held assist Transfers: Sit to/from SOmnicareSit to Stand: Mod assist;+2 safety/equipment;+2 physical assistance Stand pivot transfers: Mod assist;+2 physical assistance;+2 safety/equipment;Max assist       General transfer comment: Pt requires assist for forward translation of trunk, then becomes fearful of falling, and attempts not to stand.  He requires encouragement and facilitation to continue.  He requires assist to move into standing with facilitation at Lt hip and trunk for extenstion     Balance Overall balance assessment: Needs assistance Sitting-balance support: Feet supported;No upper extremity supported Sitting balance-Leahy Scale: Fair Sitting balance - Comments: able to sit statically with close min guard assist    Standing balance support: Single extremity supported;During functional activity Standing balance-Leahy Scale: Poor Standing balance comment: requires max A to maintain static standing, with periods of mod A without UE support.  With UE support, he requires min A - mod A (variable status)                           ADL either performed or assessed with clinical judgement   ADL                   Upper Body Dressing : Sitting;Moderate assistance Upper Body Dressing Details (indicate cue type and reason): Pt requires max cues for problem solving and sequencing.  He was able to thread Lt UE through sleeve with mod A, and required assist to pull  shirt fully overhead and down his back  Lower Body Dressing: Total assistance;Bed level   Toilet Transfer: Maximal assistance;+2 for physical assistance;+2 for safety/equipment;Ambulation;Comfort height toilet;Grab bars Toilet Transfer Details (indicate cue type and reason): Pt ambulated with +2 assist from bed to bathroom.  He requires assist to flex  hips and forward translation of trunk when sitting and assist to stand and balance to perform peri care  Toileting- Clothing Manipulation and Hygiene: Maximal assistance;Sit to/from stand Toileting - Clothing Manipulation Details (indicate cue type and reason): In standing, he was able to perform peri care with max A      Functional mobility during ADLs: Maximal assistance;Total assistance;+2 for physical assistance;+2 for safety/equipment       Vision   Additional Comments: Pt with mild Lt intattention, but will spontaneously look to Lt to locate objects    Perception     Praxis      Cognition Arousal/Alertness: Awake/alert Behavior During Therapy: Flat affect;WFL for tasks assessed/performed Overall Cognitive Status: Impaired/Different from baseline Area of Impairment: Orientation;Attention;Memory;Following commands;Safety/judgement;Awareness;Problem solving               Rancho Levels of Cognitive Functioning Rancho Los Amigos Scales of Cognitive Functioning: Confused/appropriate Orientation Level: Disoriented to;Time;Situation Current Attention Level: Selective;Alternating Memory: Decreased short-term memory;Decreased recall of precautions Following Commands: Follows one step commands consistently Safety/Judgement: Decreased awareness of deficits;Decreased awareness of safety Awareness: Intellectual;Emergent Problem Solving: Slow processing;Requires verbal cues;Difficulty sequencing;Requires tactile cues General Comments: Pt demonstrates ability to, at times, alternate attention, but at other times demonstrates sustained and selective attention.  He is internally distracted by needs to use bathroom, and fixation on need to get his vacation time and PTO submitted.  He occasionally confabulates and thinks he is at work.  He will appropriately converse for 1-2 mins, then will self distract.  He was aware that he fell out of his w/c earlier and states he was trying to go to the  bathroom.  He voiced remorse about this decision.          Exercises Other Exercises Other Exercises: Pt attempting to spontaneously use Lt UE ~25% of time as a stabilizer/support    Shoulder Instructions       General Comments spoke with nsg re: attempts to get pt on toileting schedule and transferring to [facility]     Pertinent Vitals/ Pain       Pain Assessment: Faces Faces Pain Scale: Hurts little more Pain Location: Rt shoulder  Pain Descriptors / Indicators: Grimacing;Guarding Pain Intervention(s): Monitored during session;Repositioned  Home Living                                          Prior Functioning/Environment              Frequency  Min 2X/week        Progress Toward Goals  OT Goals(current goals can now be found in the care plan section)  Progress towards OT goals: Goals met and updated - see care plan  Acute Rehab OT Goals Patient Stated Goal: Pt reports he wants to not be disrespected and to get his PTO and vacation paperwork completed.  OT Goal Formulation: With patient Time For Goal Achievement: 08/04/17 Potential to Achieve Goals: Good ADL Goals Pt Will Perform Eating: with set-up Pt Will Perform Grooming: with min assist;standing Pt Will Perform Upper Body Bathing: with min assist;sitting Pt Will  Transfer to Toilet: with mod assist;ambulating;regular height toilet;with +2 assist Additional ADL Goal #1: Pt will consistently alternate attention during ADL tasks  Additional ADL Goal #2: Pt will demonstrate emerging awareness of deficits with min cues   Plan Discharge plan remains appropriate;Other (comment)(goals addressed and updated )    Co-evaluation    PT/OT/SLP Co-Evaluation/Treatment: Yes Reason for Co-Treatment: Complexity of the patient's impairments (multi-system involvement);Necessary to address cognition/behavior during functional activity;For patient/therapist safety;To address functional/ADL transfers   OT goals  addressed during session: ADL's and self-care      AM-PAC PT "6 Clicks" Daily Activity     Outcome Measure   Help from another person eating meals?: A Little Help from another person taking care of personal grooming?: A Little Help from another person toileting, which includes using toliet, bedpan, or urinal?: A Lot Help from another person bathing (including washing, rinsing, drying)?: A Lot Help from another person to put on and taking off regular upper body clothing?: A Lot Help from another person to put on and taking off regular lower body clothing?: A Lot 6 Click Score: 14    End of Session Equipment Utilized During Treatment: Gait belt  OT Visit Diagnosis: Unsteadiness on feet (R26.81)   Activity Tolerance Patient tolerated treatment well   Patient Left in bed   Nurse Communication Mobility status        Time: 3202-3343 OT Time Calculation (min): 42 min  Charges: OT General Charges $OT Visit: 1 Visit OT Treatments $Neuromuscular Re-education: 8-22 mins  [facility], OTR/L [phone]    Lucille Passy M 07/21/2017, 7:00 PM

## 2017-07-22 LAB — GLUCOSE, CAPILLARY
GLUCOSE-CAPILLARY: 158 mg/dL — AB (ref 65–99)
GLUCOSE-CAPILLARY: 113 mg/dL — AB (ref 65–99)
GLUCOSE-CAPILLARY: 154 mg/dL — AB (ref 65–99)
Glucose-Capillary: 92 mg/dL (ref 65–99)

## 2017-07-22 NOTE — Progress Notes (Signed)
Subjective: Patient reports no change  Objective: Vital signs in last 24 hours: Temp:  [98 F (36.7 C)-98.8 F (37.1 C)] 98.3 F (36.8 C) (05/03 0456) Pulse Rate:  [95-112] 95 (05/03 0456) Resp:  [18] 18 (05/03 0456) BP: (118-136)/(66-92) 118/66 (05/03 0456) SpO2:  [95 %-99 %] 95 % (05/03 0456)  Intake/Output from previous day: 05/02 0701 - 05/03 0700 In: 360 [P.O.:360] Out: -  Intake/Output this shift: No intake/output data recorded.  Physical Exam: Stable  Lab Results: No results for input(s): WBC, HGB, HCT, PLT in the last 72 hours. BMET No results for input(s): NA, K, CL, CO2, GLUCOSE, BUN, CREATININE, CALCIUM in the last 72 hours.  Studies/Results: No results found.  Assessment/Plan: No change in status of patient.  We continue to await placement.    LOS: 70 days    Dorian Heckle, MD 07/22/2017, 12:25 PM

## 2017-07-23 LAB — GLUCOSE, CAPILLARY
GLUCOSE-CAPILLARY: 89 mg/dL (ref 65–99)
Glucose-Capillary: 122 mg/dL — ABNORMAL HIGH (ref 65–99)
Glucose-Capillary: 137 mg/dL — ABNORMAL HIGH (ref 65–99)
Glucose-Capillary: 109 mg/dL — ABNORMAL HIGH (ref 65–99)

## 2017-07-23 LAB — CREATININE, SERUM
Creatinine, Ser: 0.58 mg/dL — ABNORMAL LOW (ref 0.61–1.24)
GFR calc non Af Amer: >60 mL/min (ref >60)

## 2017-07-23 NOTE — Progress Notes (Signed)
Pt very combative and uncooperative. Refused to allow lab draw, insisted in getting out of bed. Required 3 staff members to keep him inside netting of bed until the netting could be re-zipped.

## 2017-07-23 NOTE — Progress Notes (Signed)
Patient ID: [name], male   DOB: [birth year], 52 y.o.   MRN: [id number] Vital signs are stable Insights into his situation appear poor Confabulates regarding his personal issues No changes

## 2017-07-24 LAB — GLUCOSE, CAPILLARY
GLUCOSE-CAPILLARY: 119 mg/dL — AB (ref 65–99)
GLUCOSE-CAPILLARY: 119 mg/dL — AB (ref 65–99)
Glucose-Capillary: 87 mg/dL (ref 65–99)
Glucose-Capillary: 121 mg/dL — ABNORMAL HIGH (ref 65–99)

## 2017-07-24 NOTE — Progress Notes (Signed)
Patient ID: [name], male   DOB: [birth year], 52 y.o.   MRN: [id number] BP (!) 131/95 (BP Location: Right Arm)   Pulse 93   Temp 98 F (36.7 C) (Oral)   Resp 20   Ht 6' (1.829 m)   Wt 79.4 kg (175 lb)   SpO2 99%   BMI 23.73 kg/m  Patient constantly screams nurse Moves all extremities No neurological changes

## 2017-07-25 ENCOUNTER — Encounter (HOSPITAL_COMMUNITY): Payer: Self-pay | Admitting: General Practice

## 2017-07-25 LAB — GLUCOSE, CAPILLARY
GLUCOSE-CAPILLARY: 122 mg/dL — AB (ref 65–99)
GLUCOSE-CAPILLARY: 110 mg/dL — AB (ref 65–99)
Glucose-Capillary: 95 mg/dL (ref 65–99)
Glucose-Capillary: 124 mg/dL — ABNORMAL HIGH (ref 65–99)

## 2017-07-25 NOTE — Progress Notes (Signed)
CSW following for discharge plan. Patient has received bed offer at [facility], with CSW AD signing patient in to facility. CSW updated RN. Patient did not have a good night and needed to be placed in restraints but is better today, no longer has restraints on. Patient can transition to SNF tomorrow afternoon after 24 hours without restraints.  CSW to alert MD. CSW will continue to follow.  Blenda Nicely, [place] Clinical Social Worker [phone]

## 2017-07-25 NOTE — Progress Notes (Signed)
Nutrition Follow-up  DOCUMENTATION CODES:   Not applicable  INTERVENTION:  Continue Ensure Enlive po TID, each supplement provides 350 kcal and 20 grams of protein  Continue Folic Acid, MVI w/ Minerals, Thiamine   NUTRITION DIAGNOSIS:   Inadequate oral intake related to dysphagia, lethargy/confusion as evidenced by meal completion < 50%. -ongoing  GOAL:   Patient will meet greater than or equal to 90% of their needs -progressing  MONITOR:   PO intake, Supplement acceptance, Diet advancement, Weight trends  ASSESSMENT:   Pt with PMH of ETOH abuse, previous TBI, AAA, CKD, HTN, with frequent visit to ER admitted 2/22 (found down with head injury) with R ICH, small SDH, skull fx, L hemiparesis, L neglect and loss of vision on the left.   Patient was screaming at nurses over the weekend and refusing meals, but he ate 75% this AM and continues to drink all ensure provided. More calm today. Weight fluctuating between 173-179 pounds at this time.  Clinically stable per MD, awaiting SNF placement.  Intake/Output Summary (Last 24 hours) at 07/25/2017 1554 Last data filed at 07/25/2017 1200 Gross per 24 hour  Intake 360 ml  Output -  Net 360 ml  4L fluid Positive  Labs reviewed:  CBGs 122, Na 132, K+ 3.4,   Medications reviewed and include:  Remeron, Senokot-S  Diet Order:   Diet Order           DIET DYS 2 Room service appropriate? Yes; Fluid consistency: Nectar Thick  Diet effective now          EDUCATION NEEDS:   No education needs have been identified at this time  Skin:  Skin Assessment: Reviewed RN Assessment Skin Integrity Issues:: Other (Comment) Other: non pressure wound to back  Last BM:  07/24/2017  Height:   Ht Readings from Last 1 Encounters:  05/13/17 6' (1.829 m)    Weight:   Wt Readings from Last 1 Encounters:  07/25/17 176 lb (79.8 kg)    Ideal Body Weight:  80.9 kg  BMI:  Body mass index is 23.87 kg/m.  Estimated Nutritional  Needs:   Kcal:  2100-2300  Protein:  125-140 grams  Fluid:  >2.1 L/day  Christopher Ano. Christopher Frith, MS, RD LDN Inpatient Clinical Dietitian Pager [phone]

## 2017-07-25 NOTE — Progress Notes (Addendum)
Subjective: Patient reports "I'm cold" "That's good"  Objective: Vital signs in last 24 hours: Temp:  [97.9 F (36.6 C)-98.9 F (37.2 C)] 98.2 F (36.8 C) (05/06 0344) Pulse Rate:  [72-107] 107 (05/06 0344) Resp:  [18-20] 18 (05/06 0344) BP: (108-163)/(66-97) 149/95 (05/06 0344) SpO2:  [96 %-99 %] 97 % (05/06 0344) Weight:  [79.8 kg (176 lb)] 79.8 kg (176 lb) (05/06 0500)  Intake/Output from previous day: 05/05 0701 - 05/06 0700 In: 480 [P.O.:480] Out: -  Intake/Output this shift: No intake/output data recorded.  Awakens to voice. Restraints in place right wrist and ankles. Nurse reports one episode of outbursts overnight. Posey Bed Enclosure discontinued due to mattress absorbing urine. Restraints implemented due to pt's aggressive behavior.   Lab Results: No results for input(s): WBC, HGB, HCT, PLT in the last 72 hours. BMET Recent Labs    07/23/17 0630  CREATININE 0.58*    Studies/Results: No results found.  Assessment/Plan: stable  LOS: 73 days  Awaiting SNF. Pt remains unable to make decisions regarding safety.   Georgiann Cocker 07/25/2017, 6:57 AM   Patient is without change.  Well into third month in hospital without placement.  No end in sight.

## 2017-07-26 LAB — GLUCOSE, CAPILLARY
Glucose-Capillary: 99 mg/dL (ref 65–99)
Glucose-Capillary: 201 mg/dL — ABNORMAL HIGH (ref 65–99)
Glucose-Capillary: 105 mg/dL — ABNORMAL HIGH (ref 65–99)

## 2017-07-26 MED ORDER — HALOPERIDOL LACTATE 5 MG/ML IJ SOLN: 5.0000 mg | Freq: Four times a day (QID) | INTRAMUSCULAR | 1 refills | Status: DC | PRN

## 2017-07-26 MED ORDER — OLANZAPINE 10 MG PO TABS: 10.0000 mg | ORAL_TABLET | Freq: Two times a day (BID) | ORAL | 1 refills | Status: DC | PRN

## 2017-07-26 MED ORDER — MIRTAZAPINE 15 MG PO TABS: 15.0000 mg | ORAL_TABLET | Freq: Every day | ORAL | 0 refills | Status: DC

## 2017-07-26 NOTE — Progress Notes (Signed)
  Speech Language Pathology Treatment: Cognitive-Linquistic  Patient Details Name: [name] MRN: [id number] DOB: [birth year] Today's Date: 07/26/2017 Time: 4098-1191 SLP Time Calculation (min) (ACUTE ONLY): 14 min  Assessment / Plan / Recommendation Clinical Impression  Skilled treatment session focused on cognition goals. SLP received pt in room, in bed with bilateral mittens and yelling out for nursing. SLP inquired about previously left homework, pt unable to recall and states that he left it at home. SLP provided Max A cues for current POC for discharge to SNF this afternoon. Pt was difficult to redirect, he was preseverative on having nursing come into room. Food items offered for redirection but pt was not receptive. Pt was left upright in bed with mittens in place and all needs within reach. Nursing aware that pt was requesting them to come to room.    HPI HPI: Patient is a 52 yo male, chronic alcoholic with right ICH, small SDH and skull fracture with right to left shift increased from 6 to 8mm per CT 05/14/17. Thrombocytopenia with likely poorly functioning platelets. Left hemiparesis with left neglect and loss of vision on left.      SLP Plan  Continue with current plan of care       Recommendations                   Oral Care Recommendations: Oral care BID Follow up Recommendations: Skilled Nursing facility SLP Visit Diagnosis: Cognitive communication deficit (Y78.295) Plan: Continue with current plan of care       GO                Hiroko Tregre 07/26/2017, 2:00 PM

## 2017-07-26 NOTE — Progress Notes (Addendum)
Subjective: Patient reports "Hello" "I'm ok"  Objective: Vital signs in last 24 hours: Temp:  [97.9 F (36.6 C)-99 F (37.2 C)] 97.9 F (36.6 C) (05/07 0407) Pulse Rate:  [77-102] 77 (05/07 0407) Resp:  [16-18] 18 (05/07 0407) BP: (128-143)/(68-88) 143/84 (05/07 0407) SpO2:  [91 %-100 %] 91 % (05/07 0407)  Intake/Output from previous day: 05/06 0701 - 05/07 0700 In: 360 [P.O.:360] Out: -  Intake/Output this shift: No intake/output data recorded.  Resting quietly upon entry, awakens to voice. Reports no discomfort. Follows commands BLE with improved strength LLE. LUE remains flacid. Pt turns onto side, cooperating with assesment. No areas of skin breakdown observed sacrum/coccyx, shoulders or elbows. He remains unable to make decisions regarding safety or self care.  No report of aggressive or uncooperative behavior in over 24hrs.  Lab Results: No results for input(s): WBC, HGB, HCT, PLT in the last 72 hours. BMET No results for input(s): NA, K, CL, CO2, GLUCOSE, BUN, CREATININE, CALCIUM in the last 72 hours.  Studies/Results: No results found.  Assessment/Plan: Stable   LOS: 74 days  Per Dr. Venetia Maxon, discharge to [facility] today as bed available.    Georgiann Cocker 07/26/2017, 7:03 AM   Transfer to SNF today.

## 2017-07-26 NOTE — Progress Notes (Addendum)
CSW gave instructions to RN to call [facility] to postpone transport if patient is not served by [employer] by 545 pm. CSW Rutherford Nail said that sheriff was called around 1700 and was on the way.

## 2017-07-26 NOTE — Social Work (Addendum)
Updated discharge summary sent to [facility] Admissions. Await assistant CSW director to complete paperwork for transfer before arranging [facility].   Clinical Social Worker facilitated patient discharge including contacting patient family and facility to confirm patient discharge plans.  Clinical information faxed to facility and family agreeable with plan.  CSW arranged ambulance transport via [facility] to [place].   RN to call [phone] with report prior to discharge.  Clinical Social Worker will sign off for now as social work intervention is no longer needed. Please consult [place] again if new need arises.  Doy Hutching, LCSWA Clinical Social Worker [phone]

## 2017-07-26 NOTE — Progress Notes (Signed)
CSW received phone call from pt's RN. RN informed CSW that pt has not been served as of yet.   CSW called AD of SW department, Wandra Mannan. AD informed CSW that pt can go to [facility] without being served. Paperwork being served pertain to [employer]. AD CSW will update pt's attorney that he is being moved to SNF at [facility].   Montine Circle, [facility]  [phone]

## 2017-07-26 NOTE — Discharge Summary (Signed)
Physician Discharge Summary  Patient ID: [name] ZOX:[id number] DOB/AGE:[birth year] y.o.  Admit date:05/13/2017 Discharge date:07/26/2017  Admission Diagnoses:skull fracture, frontotemporal and frontal hemorrhagic contusions, right to left shift, and right uncal herniation  Discharge Diagnoses:Traumatic brain injury, complicated by alcohol induced mood disorder, Korsakoff's psychosis Principal Problem: Agitation Active Problems: Traumatic subarachnoid hemorrhage (HCC) Encephalopathy acute Fall Frontal lobe contusion (HCC) Alcohol abuse with alcohol-induced mood disorder (HCC) TBI (traumatic brain injury) (HCC) Focal hemorrhagic contusion of cerebrum (HCC) Subdural hematoma (HCC) Benign essential HTN Stage 3 chronic kidney disease (HCC) ETOH abuse Hypokalemia Acute blood loss anemia Anemia of chronic disease Altered mental status Depression Korsakoff's psychosis, alcohol related (HCC)   Discharged Condition:stable  Hospital Course:HPI:[name] a 52 y.o.malewith history of CKD, HTN, AAA, alcohol abuse with multiple ED visits and was initially seen in ED on 2/22 after found lying in front of his home with bottle of liquor--ETOH level 277. He was treated with fluid bolus and discharged to home as work up negative. He was readmitted a couple of hours later on 2/22 after being found face down at the [place] with evidence of head injury. CT head reviewed, showing skull fracture and bleed. Per report, acute depressed right fronto-temporal skull fracture with right frontotemporal confluent acute hemorrhagic contusions, small left frontal lobe contusion, 6 mm right to left midline shift with right uncal herniation, right to left ventricular entrapment as well as R-IVH, SAH and B-SDH. Right globe prosthesis seen. He was found to have left neglect, left facial droop and left hemiparesis.   He had increase in  lethargy and follow up CT head showed increase in midline shift to 8mm. He was started on hypertonic saline with improvement in mentation. Left humerus X ray showed question of cortical avulsion fracture of dorsal aspect of olecranon possibly at insertion fo triceps tendon--LUE placed in a sling. On precedex to prevent alcohol withdrawal. He was treated for hypokalemia and anemia/thrombocytopenia felt to be due to chronic alcohol use. Diet limited to honey by tsp due to cognition as well as signs of aspiration. Patient with behaviors consistent with RLAS VI and CIR recommended due to TBI.  CIR admission was denied by insurance, allowing continued nursing acre and therapies on the floor over the following 3 months. Pt has gradually improved in all areas, specifically left lower extremity strength, alertness, and diet progression. Unfortunately, he continues to suffer confusion with episodes of aggression related to Korsakoff's psychosis, alcohol related.  Consults:psychiatry  Significant Diagnostic Studies:CT  Treatments:IV hydration, medication, PT/OT/ST  Discharge Exam: Blood pressure (!) 143/84, pulse 77, temperature 97.9 F (36.6 C), temperature source Oral, resp. rate 18, height 6' (1.829 m), weight 79.8 kg (176 lb), SpO2 91 %. Resting quietly upon entry, awakens to voice. Reports no discomfort. Follows commands BLE with improved strength LLE. LUE remains flacid. Pt turns onto side, cooperating with assesment. No areas of skin breakdown observed sacrum/coccyx, shoulders or elbows. He remains unable to make decisions regarding safety or self care. No report of aggressive or uncooperative behavior in over 24hrs.    Disposition:Discharge to [facility] today as bed available.Follow up with primary care.       Allergies as of 07/26/2017   No Known Allergies     Medication List    TAKE these medications   haloperidol lactate 5 MG/ML injection Commonly known as:   HALDOL Inject 1 mL (5 mg total) into the muscle every 6 (six) hours as needed.   mirtazapine 15 MG tablet Commonly known as:  REMERON  Take 1 tablet (15 mg total) by mouth at bedtime.   OLANZapine 10 MG tablet Commonly known as:  ZYPREXA Take 1 tablet (10 mg total) by mouth 2 (two) times daily as needed (prn agitation).      Contact information for after-discharge care    Destination    [facility] .   Service:  Skilled Nursing Contact information: [address] [place] [address] [phone]              Signed: Dorian Heckle, MD 07/26/2017, 9:32 AM

## 2017-07-26 NOTE — Clinical Social Work Placement (Signed)
   CLINICAL SOCIAL WORK PLACEMENT  NOTE [facility]   Date:  07/26/2017  Patient Details  Name: [name] MRN: [id number] Date of Birth: 08-31-1965  Clinical Social Work is seeking post-discharge placement for this patient at the Skilled  Nursing Facility level of care (*CSW will initial, date and re-position this form in  chart as items are completed):  No   Patient/family provided with [facility] Clinical Social Work Department's list of facilities offering this level of care within the geographic area requested by the patient (or if unable, by the patient's family).  No(Pt is DTP)   Patient/family informed of their freedom to choose among providers that offer the needed level of care, that participate in Medicare, Medicaid or managed care program needed by the patient, have an available bed and are willing to accept the patient.  No   Patient/family informed of [facility]'s ownership interest in [facility] and [facility], as well as of the fact that they are under no obligation to receive care at these facilities.  PASRR submitted to EDS on       PASRR number received on 05/19/17     Existing PASRR number confirmed on       FL2 transmitted to all facilities in geographic area requested by pt/family on 05/19/17     FL2 transmitted to all facilities within larger geographic area on       Patient informed that his/her managed care company has contracts with or will negotiate with certain facilities, including the following:        Yes   Patient/family informed of bed offers received.  Patient chooses bed at [facility]     Physician recommends and patient chooses bed at      Patient to be transferred to [facility] on 07/26/17.  Patient to be transferred to facility by [facility]     Patient family notified on 07/26/17 of transfer.  Name of family member notified:  CSW dept will sign pt in.      PHYSICIAN       Additional  Comment:    _______________________________________________ Doy Hutching, LCSWA 07/26/2017, 1:53 PM

## 2017-07-26 NOTE — Progress Notes (Signed)
Gave report to RN at [facility]

## 2017-07-27 ENCOUNTER — Non-Acute Institutional Stay (SKILLED_NURSING_FACILITY): Payer: Medicaid Other | Admitting: Internal Medicine

## 2017-07-27 ENCOUNTER — Encounter: Payer: Self-pay | Admitting: Internal Medicine

## 2017-07-27 DIAGNOSIS — E119 Type 2 diabetes mellitus without complications: Secondary | ICD-10-CM | POA: Diagnosis not present

## 2017-07-27 DIAGNOSIS — S069X9D Unspecified intracranial injury with loss of consciousness of unspecified duration, subsequent encounter: Secondary | ICD-10-CM | POA: Diagnosis not present

## 2017-07-27 DIAGNOSIS — R8290 Unspecified abnormal findings in urine: Secondary | ICD-10-CM

## 2017-07-27 DIAGNOSIS — F1096 Alcohol use, unspecified with alcohol-induced persisting amnestic disorder: Secondary | ICD-10-CM

## 2017-07-27 NOTE — Patient Instructions (Signed)
See assessment and plan under each diagnosis in the problem list and acutely for this visit 
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
## 2017-07-27 NOTE — Progress Notes (Deleted)
logo

## 2017-07-27 NOTE — Assessment & Plan Note (Addendum)
See ROS; confabulation precludes focused or logical history Psychiatry follow-up at the SNF

## 2017-07-27 NOTE — Assessment & Plan Note (Signed)
04/10/17 A1c 5.6% indicates prediabetes

## 2017-07-27 NOTE — Assessment & Plan Note (Signed)
Psychiatry follow-up at the SNF

## 2017-08-01 ENCOUNTER — Encounter: Payer: Self-pay | Admitting: Adult Health

## 2017-08-01 ENCOUNTER — Non-Acute Institutional Stay (SKILLED_NURSING_FACILITY): Payer: Medicaid Other | Admitting: Adult Health

## 2017-08-01 DIAGNOSIS — N390 Urinary tract infection, site not specified: Secondary | ICD-10-CM | POA: Diagnosis not present

## 2017-08-01 DIAGNOSIS — B952 Enterococcus as the cause of diseases classified elsewhere: Secondary | ICD-10-CM

## 2017-08-01 DIAGNOSIS — R630 Anorexia: Secondary | ICD-10-CM

## 2017-08-01 DIAGNOSIS — F1096 Alcohol use, unspecified with alcohol-induced persisting amnestic disorder: Secondary | ICD-10-CM | POA: Diagnosis not present
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
## 2017-08-11 ENCOUNTER — Encounter: Payer: Self-pay | Admitting: Internal Medicine

## 2017-08-11 ENCOUNTER — Non-Acute Institutional Stay (SKILLED_NURSING_FACILITY): Payer: Medicaid Other | Admitting: Internal Medicine

## 2017-08-11 DIAGNOSIS — R600 Localized edema: Secondary | ICD-10-CM | POA: Diagnosis not present

## 2017-08-11 DIAGNOSIS — F1096 Alcohol use, unspecified with alcohol-induced persisting amnestic disorder: Secondary | ICD-10-CM

## 2017-08-11 DIAGNOSIS — R296 Repeated falls: Secondary | ICD-10-CM

## 2017-08-11 NOTE — Assessment & Plan Note (Signed)
08/11/17 patient lacks capacity to understand the risk related to falling in the context of his asymmetric extremity weakness
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
## 2017-08-11 NOTE — Assessment & Plan Note (Addendum)
08/11/17 confabulation and denial concerning recurrent falls Update vitamin B12 level, TSH, and VDRL/RPR Psych NP follow-up indicated

## 2017-08-12 ENCOUNTER — Encounter: Payer: Self-pay | Admitting: Internal Medicine

## 2017-08-12 ENCOUNTER — Other Ambulatory Visit (HOSPITAL_COMMUNITY): Payer: Self-pay | Admitting: Internal Medicine

## 2017-08-12 DIAGNOSIS — R600 Localized edema: Secondary | ICD-10-CM | POA: Insufficient documentation

## 2017-08-12 DIAGNOSIS — R1310 Dysphagia, unspecified: Secondary | ICD-10-CM

## 2017-08-12 NOTE — Patient Instructions (Signed)
See assessment and plan under each diagnosis in the problem list and acutely for this visit 

## 2017-08-12 NOTE — Assessment & Plan Note (Signed)
Clinically dependent edema w/o ecchymoses or tenderness suggested; recheck 5/28 & image if progressive

## 2017-08-16 ENCOUNTER — Ambulatory Visit (HOSPITAL_COMMUNITY): Payer: Self-pay

## 2017-08-16 ENCOUNTER — Encounter (HOSPITAL_COMMUNITY): Payer: Self-pay

## 2017-08-23 ENCOUNTER — Ambulatory Visit (HOSPITAL_COMMUNITY)
Admission: RE | Admit: 2017-08-23 | Discharge: 2017-08-23 | Disposition: A | Discharge status: Home / Self Care | Payer: Medicaid Other | Source: Ambulatory Visit | Attending: Internal Medicine | Admitting: Internal Medicine

## 2017-08-23 DIAGNOSIS — R1312 Dysphagia, oropharyngeal phase: Secondary | ICD-10-CM | POA: Insufficient documentation

## 2017-08-23 DIAGNOSIS — R1310 Dysphagia, unspecified: Secondary | ICD-10-CM

## 2017-08-24 ENCOUNTER — Encounter: Payer: Self-pay | Admitting: Adult Health

## 2017-08-24 ENCOUNTER — Non-Acute Institutional Stay (SKILLED_NURSING_FACILITY): Payer: Medicaid Other | Admitting: Adult Health

## 2017-08-24 DIAGNOSIS — S069X9D Unspecified intracranial injury with loss of consciousness of unspecified duration, subsequent encounter: Secondary | ICD-10-CM

## 2017-08-24 DIAGNOSIS — Z1159 Encounter for screening for other viral diseases: Secondary | ICD-10-CM

## 2017-08-24 DIAGNOSIS — F339 Major depressive disorder, recurrent, unspecified: Secondary | ICD-10-CM

## 2017-08-24 DIAGNOSIS — I10 Essential (primary) hypertension: Secondary | ICD-10-CM | POA: Diagnosis not present

## 2017-08-24 DIAGNOSIS — Z129 Encounter for screening for malignant neoplasm, site unspecified: Secondary | ICD-10-CM

## 2017-08-24 DIAGNOSIS — E119 Type 2 diabetes mellitus without complications: Secondary | ICD-10-CM | POA: Diagnosis not present
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
## 2017-08-25 LAB — HEMOGLOBIN A1C: HEMOGLOBIN A1C: 5.1

## 2017-08-25 LAB — CBC AND DIFFERENTIAL
HCT: 35 — AB (ref 41–53)
Hemoglobin: 12.1 — AB (ref 13.5–17.5)

## 2017-08-26 LAB — HM HEPATITIS C SCREENING LAB: HM HEPATITIS C SCREENING: NEGATIVE

## 2017-08-26 NOTE — Progress Notes (Signed)
08/25/17)

## 2017-09-19 ENCOUNTER — Non-Acute Institutional Stay (SKILLED_NURSING_FACILITY): Payer: Medicaid Other | Admitting: Adult Health

## 2017-09-19 ENCOUNTER — Encounter: Payer: Self-pay | Admitting: Adult Health

## 2017-09-19 DIAGNOSIS — R1312 Dysphagia, oropharyngeal phase: Secondary | ICD-10-CM

## 2017-09-19 DIAGNOSIS — S069X9D Unspecified intracranial injury with loss of consciousness of unspecified duration, subsequent encounter: Secondary | ICD-10-CM

## 2017-09-19 DIAGNOSIS — Z1211 Encounter for screening for malignant neoplasm of colon: Secondary | ICD-10-CM

## 2017-09-19 DIAGNOSIS — G4700 Insomnia, unspecified: Secondary | ICD-10-CM

## 2017-09-19 DIAGNOSIS — F339 Major depressive disorder, recurrent, unspecified: Secondary | ICD-10-CM
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
## 2017-09-20 LAB — BASIC METABOLIC PANEL
BUN: 7 (ref 4–21)
CREATININE: 0.5 — AB (ref 0.6–1.3)
GLUCOSE: 166
POTASSIUM: 4 (ref 3.4–5.3)
SODIUM: 133 — AB (ref 137–147)

## 2017-09-20 LAB — HEPATIC FUNCTION PANEL
ALT: 15 (ref 10–40)
AST: 30 (ref 14–40)
Alkaline Phosphatase: 163 — AB (ref 25–125)
Bilirubin, Total: 0.8

## 2017-09-28 ENCOUNTER — Encounter: Payer: Self-pay | Admitting: Internal Medicine

## 2017-09-28 DIAGNOSIS — G4700 Insomnia, unspecified: Secondary | ICD-10-CM | POA: Insufficient documentation

## 2017-10-10 ENCOUNTER — Encounter: Payer: Self-pay | Admitting: Adult Health

## 2017-10-10 ENCOUNTER — Non-Acute Institutional Stay (SKILLED_NURSING_FACILITY): Payer: Medicaid Other | Admitting: Adult Health

## 2017-10-10 DIAGNOSIS — L03114 Cellulitis of left upper limb: Secondary | ICD-10-CM
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
## 2017-10-12 ENCOUNTER — Encounter: Payer: Self-pay | Admitting: Adult Health
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
## 2017-10-12 NOTE — Progress Notes (Signed)
Deleted

## 2017-10-13 ENCOUNTER — Encounter: Payer: Self-pay | Admitting: Internal Medicine

## 2017-10-13 ENCOUNTER — Non-Acute Institutional Stay (SKILLED_NURSING_FACILITY): Payer: Medicaid Other | Admitting: Internal Medicine

## 2017-10-13 DIAGNOSIS — F4323 Adjustment disorder with mixed anxiety and depressed mood: Secondary | ICD-10-CM | POA: Diagnosis not present

## 2017-10-13 NOTE — Patient Instructions (Signed)
You , your family, and legal guardian must formulate a long term plan for housing , nutrition, and healthcare support upon your discharge from the nursing facility.

## 2017-10-13 NOTE — Progress Notes (Signed)
   FACILITY [facility]ATION:  [facility]  This is a nursing facility follow up for acute psychiatric issues  Interim medical record and care [facility] was updated with review of diagnostic studies and change in clinical status since last visit were documented.  HPI: The patient is actively followed by [facility] for major depression here at the facility. Psych has prescribed Lexapro 15 mg daily and trazodone 50 mg at bedtime. Also he had been receiving psychotherapy. He has been refusing the trazodone at night.  The patient expresses being sad and depressed and wants to go home. Staff reports he has had poor adjustment to the facility since admission. 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 [place] assigned Guardian refuses to sign to allow him to leave the facility. It was learned that there is a restraining order against the patient by his wife and he cannot return home. Basically he has no active insurance or health plan support He now expresses desire "just to go to sleep" if he cannot leave the facility.  His son has mentioned possibly purchasing a ticket [place]. His mother is in a facility.His son again states that going home [place] in [place] is not an option due to the restraining order.  He is being actively treated for cellulitis of the left elbow with cephalexin 500 mg 4 times a day. This is to be completed 10/18/17. Wound Care nurse states that there is still serosanguineous drainage from the elbow but no purulence   Physical exam was not completed.  Group discussion was held among that the patient, his son, family friend,DON of SNF, Wound Care Nurse and myself.  Summary of the present situation: #1 the State of [place] has determined he is not mentally competent to make healthcare decisions and has appointed a guardian.  Guardian declines to allow the patient leave the facility. #2 his wife has a restraining order against him and he is not allowed to contact her except through an attorney  #3 if he insists on leaving the facility without a support system in place and approval of his guardian, he will be sent to the hospital for psychiatric assessment  Plan: After group discussion, he'll remain in the SNF to complete the antibiotic course. His family, guardian, SNF Social Services and he will formulate a plan for long-term care. One option might be a halfway or group home. If a support system is not in place there is definite risk to his health or life based on his prior history with traumatic brain injury and alcohol abuse.  Total time 45 minutes; greater than 50% of the visit spent counseling patient and coordinating care for problems addressed at this encounter

## 2017-11-15 ENCOUNTER — Non-Acute Institutional Stay (SKILLED_NURSING_FACILITY): Payer: Medicaid Other | Admitting: Internal Medicine

## 2017-11-15 ENCOUNTER — Encounter: Payer: Self-pay | Admitting: Internal Medicine

## 2017-11-15 DIAGNOSIS — E119 Type 2 diabetes mellitus without complications: Secondary | ICD-10-CM | POA: Diagnosis not present

## 2017-11-15 DIAGNOSIS — I10 Essential (primary) hypertension: Secondary | ICD-10-CM | POA: Diagnosis not present

## 2017-11-15 DIAGNOSIS — F331 Major depressive disorder, recurrent, moderate: Secondary | ICD-10-CM

## 2017-11-15 NOTE — Assessment & Plan Note (Addendum)
Discuss present room arrangement with DON; it is unclear why his roommate was transferred.  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

## 2017-11-15 NOTE — Assessment & Plan Note (Signed)
A1c is current and nondiabetic at 5.1%

## 2017-11-15 NOTE — Patient Instructions (Addendum)
See assessment and plan under each diagnosis in the problem list and acutely for this visit 
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
## 2017-11-15 NOTE — Assessment & Plan Note (Addendum)
Blood pressure control is adequate without antihypertensive medication

## 2017-11-16 ENCOUNTER — Encounter: Payer: Self-pay | Admitting: Internal Medicine

## 2017-12-14 ENCOUNTER — Encounter: Payer: Self-pay | Admitting: Adult Health

## 2017-12-14 ENCOUNTER — Non-Acute Institutional Stay (SKILLED_NURSING_FACILITY): Payer: Medicaid Other | Admitting: Adult Health

## 2017-12-14 DIAGNOSIS — F331 Major depressive disorder, recurrent, moderate: Secondary | ICD-10-CM

## 2017-12-14 DIAGNOSIS — S069X9D Unspecified intracranial injury with loss of consciousness of unspecified duration, subsequent encounter: Secondary | ICD-10-CM

## 2017-12-14 DIAGNOSIS — D649 Anemia, unspecified: Secondary | ICD-10-CM | POA: Diagnosis not present
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
## 2018-01-11 ENCOUNTER — Non-Acute Institutional Stay (SKILLED_NURSING_FACILITY): Payer: Medicaid Other | Admitting: Adult Health

## 2018-01-11 ENCOUNTER — Encounter: Payer: Self-pay | Admitting: Adult Health

## 2018-01-11 DIAGNOSIS — F331 Major depressive disorder, recurrent, moderate: Secondary | ICD-10-CM | POA: Diagnosis not present

## 2018-01-11 DIAGNOSIS — E119 Type 2 diabetes mellitus without complications: Secondary | ICD-10-CM | POA: Diagnosis not present

## 2018-01-11 DIAGNOSIS — I10 Essential (primary) hypertension: Secondary | ICD-10-CM | POA: Diagnosis not present

## 2018-01-11 DIAGNOSIS — S069X9D Unspecified intracranial injury with loss of consciousness of unspecified duration, subsequent encounter: Secondary | ICD-10-CM
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
## 2018-02-09 ENCOUNTER — Encounter: Payer: Self-pay | Admitting: Internal Medicine

## 2018-02-09 ENCOUNTER — Non-Acute Institutional Stay (SKILLED_NURSING_FACILITY): Payer: Medicaid Other | Admitting: Internal Medicine

## 2018-02-09 DIAGNOSIS — F333 Major depressive disorder, recurrent, severe with psychotic symptoms: Secondary | ICD-10-CM | POA: Diagnosis not present

## 2018-02-09 DIAGNOSIS — F0789 Other personality and behavioral disorders due to known physiological condition: Secondary | ICD-10-CM

## 2018-02-09 DIAGNOSIS — G939 Disorder of brain, unspecified: Secondary | ICD-10-CM | POA: Insufficient documentation

## 2018-02-09 DIAGNOSIS — G9389 Other specified disorders of brain: Secondary | ICD-10-CM

## 2018-02-09 DIAGNOSIS — F079 Unspecified personality and behavioral disorder due to known physiological condition: Secondary | ICD-10-CM

## 2018-02-09 NOTE — Assessment & Plan Note (Addendum)
Clinically profoundly depressed.   Psych NP follow-up will be requested TSH, B12, and VLDL (RPR) will be performed

## 2018-02-09 NOTE — Patient Instructions (Signed)
See assessment and plan under each diagnosis in the problem list and acutely for this visit 

## 2018-02-09 NOTE — Assessment & Plan Note (Addendum)
Staff and administration have discussed the situation with him.  Such behavior would place him, other residents, and staff at risk for infectious disease and bodily harm.  This is especially true of those with immune compromise. If such behavior is documented  & persists, medically it is mandatory that he be discharged from the facility or transferred to a psychiatric facility. Social Services actually states they are pursuing assisted living if diet can be advanced as he is now essentially independent except for help with bathing. Speech Therapy to assess diet advancement
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
## 2018-02-10 ENCOUNTER — Encounter: Payer: Self-pay | Admitting: Internal Medicine

## 2018-03-01 ENCOUNTER — Emergency Department (HOSPITAL_COMMUNITY): Payer: Medicaid Other

## 2018-03-01 ENCOUNTER — Emergency Department (HOSPITAL_COMMUNITY)
Admission: EM | Admit: 2018-03-01 | Discharge: 2018-03-01 | Disposition: A | Discharge status: Home / Self Care | Payer: Medicaid Other | Attending: Emergency Medicine | Admitting: Emergency Medicine

## 2018-03-01 ENCOUNTER — Encounter (HOSPITAL_COMMUNITY): Payer: Self-pay | Admitting: Emergency Medicine

## 2018-03-01 ENCOUNTER — Other Ambulatory Visit: Payer: Self-pay

## 2018-03-01 DIAGNOSIS — N183 Chronic kidney disease, stage 3 (moderate): Secondary | ICD-10-CM | POA: Insufficient documentation

## 2018-03-01 DIAGNOSIS — I129 Hypertensive chronic kidney disease with stage 1 through stage 4 chronic kidney disease, or unspecified chronic kidney disease: Secondary | ICD-10-CM | POA: Insufficient documentation

## 2018-03-01 DIAGNOSIS — F322 Major depressive disorder, single episode, severe without psychotic features: Secondary | ICD-10-CM | POA: Insufficient documentation

## 2018-03-01 DIAGNOSIS — E119 Type 2 diabetes mellitus without complications: Secondary | ICD-10-CM | POA: Insufficient documentation

## 2018-03-01 DIAGNOSIS — F333 Major depressive disorder, recurrent, severe with psychotic symptoms: Secondary | ICD-10-CM

## 2018-03-01 DIAGNOSIS — R4182 Altered mental status, unspecified: Secondary | ICD-10-CM | POA: Insufficient documentation

## 2018-03-01 DIAGNOSIS — Z79899 Other long term (current) drug therapy: Secondary | ICD-10-CM | POA: Diagnosis not present

## 2018-03-01 DIAGNOSIS — Z7722 Contact with and (suspected) exposure to environmental tobacco smoke (acute) (chronic): Secondary | ICD-10-CM | POA: Insufficient documentation

## 2018-03-01 LAB — CBC
HCT: 48.1 % (ref 39.0–52.0)
HEMOGLOBIN: 16.3 g/dL (ref 13.0–17.0)
MCH: 31.5 pg (ref 26.0–34.0)
MCHC: 33.9 g/dL (ref 30.0–36.0)
MCV: 93 fL (ref 80.0–100.0)
NRBC: 0 % (ref 0.0–0.2)
Platelets: 83 10*3/uL — ABNORMAL LOW (ref 150–400)
RBC: 5.17 MIL/uL (ref 4.22–5.81)
RDW: 13.1 % (ref 11.5–15.5)
WBC: 5 10*3/uL (ref 4.0–10.5)

## 2018-03-01 LAB — URINALYSIS, ROUTINE W REFLEX MICROSCOPIC
BILIRUBIN URINE: NEGATIVE
GLUCOSE, UA: NEGATIVE mg/dL
Hgb urine dipstick: NEGATIVE
Ketones, ur: NEGATIVE mg/dL
Leukocytes, UA: NEGATIVE
NITRITE: NEGATIVE
PROTEIN: NEGATIVE mg/dL
Specific Gravity, Urine: 1.019 (ref 1.005–1.030)
pH: 6 (ref 5.0–8.0)

## 2018-03-01 LAB — RAPID URINE DRUG SCREEN, HOSP PERFORMED
AMPHETAMINES: NOT DETECTED
BARBITURATES: NOT DETECTED
Benzodiazepines: NOT DETECTED
COCAINE: NOT DETECTED
Opiates: NOT DETECTED
Tetrahydrocannabinol: NOT DETECTED

## 2018-03-01 LAB — COMPREHENSIVE METABOLIC PANEL
ALK PHOS: 136 U/L — AB (ref 38–126)
ALT: 22 U/L (ref 0–44)
ANION GAP: 7 (ref 5–15)
AST: 33 U/L (ref 15–41)
Albumin: 3.6 g/dL (ref 3.5–5.0)
BILIRUBIN TOTAL: 1 mg/dL (ref 0.3–1.2)
BUN: 5 mg/dL — ABNORMAL LOW (ref 6–20)
CALCIUM: 9.3 mg/dL (ref 8.9–10.3)
CHLORIDE: 101 mmol/L (ref 98–111)
CO2: 29 mmol/L (ref 22–32)
Creatinine, Ser: 0.68 mg/dL (ref 0.61–1.24)
Glucose, Bld: 153 mg/dL — ABNORMAL HIGH (ref 70–99)
Potassium: 4.1 mmol/L (ref 3.5–5.1)
Sodium: 137 mmol/L (ref 135–145)
TOTAL PROTEIN: 7.7 g/dL (ref 6.5–8.1)

## 2018-03-01 LAB — ETHANOL: Alcohol, Ethyl (B): <10 mg/dL (ref <10)

## 2018-03-01 LAB — SALICYLATE LEVEL: Salicylate Lvl: <7 mg/dL (ref 2.8–30.0)

## 2018-03-01 LAB — ACETAMINOPHEN LEVEL

## 2018-03-01 LAB — VALPROIC ACID LEVEL: Valproic Acid Lvl: <10 ug/mL — ABNORMAL LOW (ref 50.0–100.0)

## 2018-03-01 LAB — AMMONIA: AMMONIA: 42 umol/L — AB (ref 9–35)
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
## 2018-03-01 NOTE — ED Notes (Signed)
Pt refused to sign for AMA. This Rn called [facility] to inform them that he is coming back.

## 2018-03-01 NOTE — ED Notes (Signed)
Asked pt to dress out in purple scrubs. Pt refuses stating that he feels clean and comfortable and would like to stay that way. Pt says he has no belongings to inventory. Pt states he does not want to stay. [facility]
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
## 2018-03-01 NOTE — ED Provider Notes (Signed)
  Physical Exam  BP 139/84 (BP Location: Right Arm)   Pulse 77   Temp 98.1 F (36.7 C) (Oral)   Resp 16   Ht 6' (1.829 m)   Wt 98.9 kg   SpO2 99%   BMI 29.57 kg/m   Physical Exam  Constitutional: He appears well-developed and well-nourished. No distress.  HENT:  Head: Normocephalic and atraumatic.  Eyes: Conjunctivae and EOM are normal. No scleral icterus.  Neck: Normal range of motion.  Pulmonary/Chest: Effort normal. No respiratory distress.  Neurological: He is alert.  Skin: No rash noted. He is not diaphoretic.  Psychiatric: He has a normal mood and affect.  Nursing note and vitals reviewed.   ED Course/Procedures     Procedures  MDM  Care handed off from previous provider, MD Pickering.  Please see their note for further detail.  Briefly, patient is here voluntarily for his worsening depression.  He is a poor historian so previous provider had difficulty obtaining history.  He is currently on Lexapro.  Apparently, his wife took his car, he has been having trouble at work for the past several months.  He reports suicidal thoughts but denies plan or attempt.  Plan is to obtain medical clearance labs, CT of the head and reassess.  Depakote level was checked and is low.  However, patient states that he is only on one medication and this appears to be Lexapro.  I do not see any documentation that he is on Depakote.  Platelets low at 83.  Lab work is otherwise unremarkable.  Ammonia level pending.  CT of the head shows findings consistent with prior hemorrhage but no acute abnormalities.  Chest x-ray is negative.  TTS has been consulted and has evaluated the patient but her disposition is pending.  TTS states that patient is psychiatrically cleared.  Ammonia level pending.  Patient does not want to stay for this to result.  He will be leaving AMA.     Portions of this note were generated with Scientist, clinical (histocompatibility and immunogenetics)Dragon dictation software. Dictation errors may occur despite best attempts at  proofreading.        Dietrich PatesKhatri, Jabarri Stefanelli, PA-C 03/01/18 2047    Alvira MondaySchlossman, Erin, MD 03/02/18 1140

## 2018-03-01 NOTE — ED Triage Notes (Signed)
Pt coming in by [employer] from [facility], voluntary. Pt reports he's been having depression and negative thoughts for the last couple of months d/t a lot of changes in his. Reports SI with no plan. Denies HI and hallucinations. Denies pain. No psych hx.   Pt states he does not want to change into scrubs. [employer] at bedside.

## 2018-03-01 NOTE — BH Assessment (Signed)
TTS assessment has been completed.  [facility].

## 2018-03-02 ENCOUNTER — Encounter: Payer: Self-pay | Admitting: Internal Medicine

## 2018-03-02 DIAGNOSIS — F322 Major depressive disorder, single episode, severe without psychotic features: Secondary | ICD-10-CM | POA: Insufficient documentation

## 2018-03-04 ENCOUNTER — Other Ambulatory Visit: Payer: Self-pay

## 2018-03-04 ENCOUNTER — Emergency Department (HOSPITAL_COMMUNITY)
Admission: EM | Admit: 2018-03-04 | Discharge: 2018-03-06 | Disposition: A | Discharge status: Home / Self Care | Payer: Medicaid Other | Attending: Emergency Medicine | Admitting: Emergency Medicine

## 2018-03-04 DIAGNOSIS — S069XAA Unspecified intracranial injury with loss of consciousness status unknown, initial encounter: Secondary | ICD-10-CM | POA: Diagnosis present

## 2018-03-04 DIAGNOSIS — Z7722 Contact with and (suspected) exposure to environmental tobacco smoke (acute) (chronic): Secondary | ICD-10-CM | POA: Diagnosis not present

## 2018-03-04 DIAGNOSIS — N183 Chronic kidney disease, stage 3 (moderate): Secondary | ICD-10-CM | POA: Diagnosis not present

## 2018-03-04 DIAGNOSIS — F4325 Adjustment disorder with mixed disturbance of emotions and conduct: Secondary | ICD-10-CM | POA: Diagnosis not present

## 2018-03-04 DIAGNOSIS — R45851 Suicidal ideations: Secondary | ICD-10-CM | POA: Diagnosis not present

## 2018-03-04 DIAGNOSIS — E1122 Type 2 diabetes mellitus with diabetic chronic kidney disease: Secondary | ICD-10-CM | POA: Diagnosis not present

## 2018-03-04 DIAGNOSIS — Z79899 Other long term (current) drug therapy: Secondary | ICD-10-CM | POA: Diagnosis not present

## 2018-03-04 DIAGNOSIS — Z008 Encounter for other general examination: Secondary | ICD-10-CM | POA: Diagnosis present

## 2018-03-04 DIAGNOSIS — F332 Major depressive disorder, recurrent severe without psychotic features: Secondary | ICD-10-CM | POA: Diagnosis not present

## 2018-03-04 DIAGNOSIS — I129 Hypertensive chronic kidney disease with stage 1 through stage 4 chronic kidney disease, or unspecified chronic kidney disease: Secondary | ICD-10-CM | POA: Diagnosis not present

## 2018-03-04 DIAGNOSIS — S069X9A Unspecified intracranial injury with loss of consciousness of unspecified duration, initial encounter: Secondary | ICD-10-CM | POA: Diagnosis present

## 2018-03-04 LAB — CBC WITH DIFFERENTIAL/PLATELET
Abs Immature Granulocytes: 0.04 10*3/uL (ref 0.00–0.07)
Basophils Absolute: 0 10*3/uL (ref 0.0–0.1)
Basophils Relative: 1 %
Eosinophils Absolute: 0.1 10*3/uL (ref 0.0–0.5)
Eosinophils Relative: 2 %
HCT: 45.8 % (ref 39.0–52.0)
Hemoglobin: 15.4 g/dL (ref 13.0–17.0)
Immature Granulocytes: 1 %
Lymphocytes Relative: 37 %
Lymphs Abs: 2.5 10*3/uL (ref 0.7–4.0)
MCH: 31.9 pg (ref 26.0–34.0)
MCHC: 33.6 g/dL (ref 30.0–36.0)
MCV: 94.8 fL (ref 80.0–100.0)
Monocytes Absolute: 0.6 10*3/uL (ref 0.1–1.0)
Monocytes Relative: 9 %
Neutro Abs: 3.4 10*3/uL (ref 1.7–7.7)
Neutrophils Relative %: 50 %
Platelets: 95 10*3/uL — ABNORMAL LOW (ref 150–400)
RBC: 4.83 MIL/uL (ref 4.22–5.81)
RDW: 13.3 % (ref 11.5–15.5)
WBC: 6.7 10*3/uL (ref 4.0–10.5)
nRBC: 0 % (ref 0.0–0.2)

## 2018-03-04 LAB — URINALYSIS, ROUTINE W REFLEX MICROSCOPIC
Bacteria, UA: NONE SEEN
Bilirubin Urine: NEGATIVE
Glucose, UA: NEGATIVE mg/dL
Ketones, ur: NEGATIVE mg/dL
Leukocytes, UA: NEGATIVE
Nitrite: NEGATIVE
Protein, ur: NEGATIVE mg/dL
Specific Gravity, Urine: 1.017 (ref 1.005–1.030)
pH: 7 (ref 5.0–8.0)

## 2018-03-04 LAB — SALICYLATE LEVEL: Salicylate Lvl: <7 mg/dL (ref 2.8–30.0)

## 2018-03-04 LAB — ACETAMINOPHEN LEVEL: Acetaminophen (Tylenol), Serum: <10 ug/mL — ABNORMAL LOW (ref 10–30)

## 2018-03-04 LAB — COMPREHENSIVE METABOLIC PANEL
ALT: 22 U/L (ref 0–44)
AST: 29 U/L (ref 15–41)
Albumin: 3.6 g/dL (ref 3.5–5.0)
Alkaline Phosphatase: 110 U/L (ref 38–126)
Anion gap: 8 (ref 5–15)
BUN: 9 mg/dL (ref 6–20)
CO2: 24 mmol/L (ref 22–32)
Calcium: 9.1 mg/dL (ref 8.9–10.3)
Chloride: 106 mmol/L (ref 98–111)
Creatinine, Ser: 0.66 mg/dL (ref 0.61–1.24)
GFR calc Af Amer: >60 mL/min (ref >60)
GFR calc non Af Amer: >60 mL/min (ref >60)
Glucose, Bld: 96 mg/dL (ref 70–99)
Potassium: 3.9 mmol/L (ref 3.5–5.1)
Sodium: 138 mmol/L (ref 135–145)
Total Bilirubin: 1.6 mg/dL — ABNORMAL HIGH (ref 0.3–1.2)
Total Protein: 7 g/dL (ref 6.5–8.1)

## 2018-03-04 LAB — RAPID URINE DRUG SCREEN, HOSP PERFORMED
Amphetamines: NOT DETECTED
Barbiturates: NOT DETECTED
Benzodiazepines: NOT DETECTED
Cocaine: NOT DETECTED
Opiates: NOT DETECTED
Tetrahydrocannabinol: NOT DETECTED

## 2018-03-04 LAB — AMMONIA: Ammonia: 60 umol/L — ABNORMAL HIGH (ref 9–35)

## 2018-03-04 LAB — ETHANOL: Alcohol, Ethyl (B): <10 mg/dL (ref <10)

## 2018-03-04 MED ORDER — ESCITALOPRAM OXALATE 10 MG PO TABS
15.0000 mg | ORAL_TABLET | Freq: Every day | ORAL | Status: DC
  Administered 2018-03-05 – 2018-03-06 (×2): 15 mg via ORAL
  Filled 2018-03-04 (×2): qty 2

## 2018-03-04 NOTE — ED Notes (Signed)
Pt stated "I just wanted staff to get off my back.  They were just asking a lot of questions."  Pt disoriented to date (10/26)
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
## 2018-03-04 NOTE — Progress Notes (Signed)
03/04/18  1459  Labs drawn. Dark green tube sent to mini lab. Dark green, light green, gold, and purple tubes sent to main lab.
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
## 2018-03-04 NOTE — BH Assessment (Signed)
[facility] Assessment Progress Note  Case was staffed with Lord DNP who recommended patient be observed and monitored.         
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
## 2018-03-04 NOTE — ED Notes (Addendum)
Nanine MeansJamison Lord, DNP called for clarification of disposition.  Call transferred to Dr. Madilyn Hookees.

## 2018-03-05 DIAGNOSIS — F4325 Adjustment disorder with mixed disturbance of emotions and conduct: Secondary | ICD-10-CM

## 2018-03-05 MED ORDER — POLYVINYL ALCOHOL 1.4 % OP SOLN
2.0000 [drp] | Freq: Four times a day (QID) | OPHTHALMIC | Status: DC | PRN
  Administered 2018-03-05: 2 [drp] via OPHTHALMIC
  Filled 2018-03-05: qty 15

## 2018-03-05 NOTE — ED Notes (Signed)
Pt out to desk c/o eyes burning & stating "I need eye drops."  Informed will speak to MD.  Pt stated "You don't have to ask the MD.  It's eye drops.  It's Visine."

## 2018-03-05 NOTE — ED Notes (Signed)
Dr. Lynelle DoctorKnapp informed of pt's request for eye drops.  Verbal order received.  See orders.

## 2018-03-05 NOTE — Progress Notes (Addendum)
CSW contacted [facility], the facility the patient is a resident of [phone]). Talked to nurse, "Robbie LouisSharyee," says she will need to contact her director regarding patient's discharge. CSW expecting a returned call.  ED Psychiatrist has rescinded IVC.  10:03am Received call from "Thayer OhmChris" (personal cell: [phone]). Thayer OhmChris says the facility director refuses to accept patient back today. CSW explained if facility refuses to pick up patient, CSW will make calls to APS and DHHS. Chris verbalizes understanding, still refuses to accept patient back today.  CSW left detailed voicemail for [facility] Ronnald CollumGuardian Jewel ScandiaMonroe [phone]   10:55am CSW waiting for [facility] after hours social worker to call back to file a report.  11:25am CSW spoke with Illene SilverAndrian Carmichael, weekend APS worker. Kathaleen Grinderndrian says that an APS report is not necessary because the patient has a legal guardianAnnell Greening- Jewel Monroe [phone]).  12:00pm CSW staffed case with SW AD. SW AD advised CSW to continue to reach out to legal guardian in an effort to discharge the patient back to their facility.  Myrtis SerAdrian Carmichael, weekend APS worker, called back. APS supervisor called back to explain that the patient has been a [facility]. Per supervisor, the patient has not had issues with placement thus far. Per supervisor "a notice is usually a courtesy, but they technically don't have to give us notice." Patient's legal guardian, Annell GreeningJewel [place] is in [facility] with [facility] and is reportedly trying to get facility to take patient back.  APS supervisor asked CSW if CSW would be willing to complete an new FL2 and work on placement for patient and hold patient in E.D. CSW explained this would not be the most appropriate use of resources, requested that guardian work to get patient back to their facility today.   12:35pm DSS guardian Annell GreeningJewel [place] called to update CSW that the guardian has  tried to reach [facility] director of nursing. Guardian has not been able to speak with someone yet, states she may drive to facility this afternoon. No updates otherwise, but wanted to make herself available to CSW.   CSW following for updates.  Enid Cutterharlotte Nicle Connole, LCSW-A Clinical Social Worker [phone]
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
## 2018-03-05 NOTE — ED Notes (Signed)
Pt was encouraged to shower as he has body odor and his sheet was filthy when he got up for the restroom.  Pt refuses to shower as he states he is cold and just wants to leave.

## 2018-03-05 NOTE — Progress Notes (Signed)
Received [name] this PM after the change of shift, initially he was standing at the entrance of his room. During the assessment he shared his desired to returned to [facility]. He is concerned about his items  in his room, especially his teeth. He verbalized his desire to have a compatible room mate for company. He endorsed feeling suicidal at intervals related to his current health issues and feeling the loss of his family. He presently does not feel suicidal. He is alert and oriented x4 although his conversation topics changes without notice. He requested to call the police to transport him back to the nursing facility and found it difficult to understand  his request cannot be granted. He eventually calm down with nourishments. The sitter remains at the bedside.

## 2018-03-05 NOTE — ED Notes (Signed)
Pt's watch removed & placed with personal belongings.  The watch was not working at the time of removal.

## 2018-03-06 NOTE — Progress Notes (Addendum)
12:02pm- CSW spoke with AD Zack and was informed that pt is able to return [facility] per Donald Porehris Freeman with [facility]. CSW updated Marylene Landngela RNCM who  Expressed that she would update MD and RN of pt's ability to return to facility. RN to call report to [phone]. RN to call for transportation [facility].   There are no further CSW needs. CSW signing off.   11:25am-CSW reached back ou tot Christ [facility] to see when pt can return tot he facility CSW left voicemail asking that she call CSW back with further updates.   8:25am-CSW received call from pt's legal guardian Jewel. Jewel informed CSW that [facility] expressed to her that they would take pt back with counseling. CSW spoke with Donald Porehris Freeman [phone] and was informed that pt's mother passed away two weeks ago and she seems to think that this is why pt has been acting out. Thayer OhmChris reports that pt gets therapy every other week and refuses to speak with anyone at the facility regarding feelings since pt's mother has passed away. CSW spoke with AD to give updated information and to inform him of request to call Thayer Ohmhris with [facility]. AD expressed he would. CSW awaiting update from AD on if pt can return to facility.   CSW following in attempts to get back to [facility] at this time. CSW. was advised by CSW AD to follow up with Pt's legal guardian for further udpates on possible alterative placements for or to get and update on [facility] taking pt back. Pt legal guardian is with [facility] Jewel Monroe [phone]. CSW left voicemail at this time.    Claude MangesKierra S. Robi Dewolfe, MSW, LCSW-A Emergency Department Clinical Social Worker [phone]

## 2018-03-06 NOTE — Progress Notes (Addendum)
CSW [place] a call from [place] Pt has been accepted for return by: [facility] Number for report is: [phone] Pt's unit/room/bed number will be: 126-B Accepting physician: SNF MD  Pt can arrive ASAP on 12/156  CSW will update RN.  RN updated.  Dorothe PeaJonathan F. Dalicia Kisner, LCSW, LCAS, CSI Clinical Social Worker Ph: [phone]

## 2018-03-06 NOTE — ED Provider Notes (Signed)
Psychiatry has cleared patient. Per SW he is cleared to go back to facility.   Pricilla LovelessGoldston, Mckaylee Dimalanta, MD 03/06/18 1225

## 2018-03-06 NOTE — ED Notes (Addendum)
Pt expressed concern about some belongings left at [facility] that he would like to retrieve: dentures, Nike shoes, [place] jacket, and some other jackets. He verbalized his desire to go there himself to get these items. He said calling to get them would not suffice as they don't know where these are located. He was told that him going to retrieve these would not be possible as he is currently involuntarily committed.

## 2018-03-06 NOTE — ED Notes (Signed)
[facility] called for transport back to [facility]. Dispatch said several were ahead, and estimated 3-4 hours.

## 2018-03-06 NOTE — ED Notes (Signed)
Introduced self to pt, who began discussing how he is intent upon getting back to facility to secure his valuables. Pt said he had a long night. Pt remains safe with safety sitter present. Will continue to monitor for needs/safety.

## 2018-03-06 NOTE — ED Notes (Signed)
Patient picked up by PT[facility] and transported to [facility]. Patient stable for transport.

## 2018-03-06 NOTE — Discharge Instructions (Signed)
If you develop thoughts of wanting to hurt or kill yourself, or you develop fever, chest pain, abdominal pain, vomiting or any other new/concerning symptoms then return to the ER for evaluation.

## 2018-03-07 ENCOUNTER — Non-Acute Institutional Stay (SKILLED_NURSING_FACILITY): Payer: Medicaid Other | Admitting: Internal Medicine

## 2018-03-07 ENCOUNTER — Encounter: Payer: Self-pay | Admitting: Internal Medicine

## 2018-03-07 DIAGNOSIS — R29818 Other symptoms and signs involving the nervous system: Secondary | ICD-10-CM

## 2018-03-07 DIAGNOSIS — R4189 Other symptoms and signs involving cognitive functions and awareness: Secondary | ICD-10-CM

## 2018-03-07 DIAGNOSIS — F4325 Adjustment disorder with mixed disturbance of emotions and conduct: Secondary | ICD-10-CM

## 2018-03-07 LAB — CBC AND DIFFERENTIAL
HCT: 41 (ref 41–53)
Hemoglobin: 14.2 (ref 13.5–17.5)
NEUTROS ABS: 3
Platelets: 80 — AB (ref 150–399)
WBC: 5.2

## 2018-03-07 LAB — LIPID PANEL
Cholesterol: 127 (ref 0–200)
HDL: 33 — AB (ref 35–70)
LDL Cholesterol: 79
LDl/HDL Ratio: 3.8
Triglycerides: 74 (ref 40–160)

## 2018-03-07 LAB — BASIC METABOLIC PANEL
BUN: 11 (ref 4–21)
Creatinine: 0.6 (ref 0.6–1.3)
Glucose: 120
Potassium: 4 (ref 3.4–5.3)
Sodium: 140 (ref 137–147)

## 2018-03-07 LAB — HEPATIC FUNCTION PANEL
ALT: 14 (ref 10–40)
AST: 23 (ref 14–40)
Alkaline Phosphatase: 136 — AB (ref 25–125)
BILIRUBIN, TOTAL: 0.5

## 2018-03-07 NOTE — Assessment & Plan Note (Signed)
Clinically the patient's neurocognitive function has improved since initial testing. This will be repeated to assess the severity of any neurocognitive component to his behavioral issues

## 2018-03-07 NOTE — Patient Instructions (Signed)
See assessment and plan under each diagnosis in the problem list and acutely for this visit 
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
## 2018-03-07 NOTE — Assessment & Plan Note (Addendum)
Psych NP follow-up at SNF Lack of hygiene is mainly cosmetic, but the behavior involving feces increases health risk to other residents and staff.  If such reoccurs discharge should be pursued. Neurocognitive testing with [facility] will be repeated.

## 2018-03-09 ENCOUNTER — Encounter: Payer: Self-pay | Admitting: Internal Medicine

## 2018-03-09 DIAGNOSIS — D696 Thrombocytopenia, unspecified: Secondary | ICD-10-CM | POA: Insufficient documentation

## 2018-03-10 ENCOUNTER — Encounter: Payer: Self-pay | Admitting: Adult Health

## 2018-03-10 ENCOUNTER — Non-Acute Institutional Stay (SKILLED_NURSING_FACILITY): Payer: Medicaid Other | Admitting: Adult Health

## 2018-03-10 DIAGNOSIS — F09 Unspecified mental disorder due to known physiological condition: Secondary | ICD-10-CM

## 2018-03-10 DIAGNOSIS — F322 Major depressive disorder, single episode, severe without psychotic features: Secondary | ICD-10-CM | POA: Diagnosis not present

## 2018-03-10 DIAGNOSIS — F39 Unspecified mood [affective] disorder: Secondary | ICD-10-CM
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
## 2018-03-13 ENCOUNTER — Emergency Department (HOSPITAL_COMMUNITY)
Admission: EM | Admit: 2018-03-13 | Discharge: 2018-03-14 | Disposition: A | Discharge status: Home / Self Care | Payer: Medicaid Other | Attending: Emergency Medicine | Admitting: Emergency Medicine

## 2018-03-13 ENCOUNTER — Encounter (HOSPITAL_COMMUNITY): Payer: Self-pay | Admitting: *Deleted

## 2018-03-13 DIAGNOSIS — Z79899 Other long term (current) drug therapy: Secondary | ICD-10-CM | POA: Insufficient documentation

## 2018-03-13 DIAGNOSIS — Z599 Problem related to housing and economic circumstances, unspecified: Secondary | ICD-10-CM | POA: Diagnosis not present

## 2018-03-13 DIAGNOSIS — N183 Chronic kidney disease, stage 3 (moderate): Secondary | ICD-10-CM | POA: Insufficient documentation

## 2018-03-13 DIAGNOSIS — I129 Hypertensive chronic kidney disease with stage 1 through stage 4 chronic kidney disease, or unspecified chronic kidney disease: Secondary | ICD-10-CM | POA: Insufficient documentation

## 2018-03-13 DIAGNOSIS — E1122 Type 2 diabetes mellitus with diabetic chronic kidney disease: Secondary | ICD-10-CM | POA: Insufficient documentation

## 2018-03-13 LAB — COMPREHENSIVE METABOLIC PANEL
ALT: 24 U/L (ref 0–44)
ANION GAP: 9 (ref 5–15)
AST: 38 U/L (ref 15–41)
Albumin: 3.3 g/dL — ABNORMAL LOW (ref 3.5–5.0)
Alkaline Phosphatase: 139 U/L — ABNORMAL HIGH (ref 38–126)
BILIRUBIN TOTAL: 0.9 mg/dL (ref 0.3–1.2)
BUN: 9 mg/dL (ref 6–20)
CO2: 27 mmol/L (ref 22–32)
Calcium: 9.3 mg/dL (ref 8.9–10.3)
Chloride: 98 mmol/L (ref 98–111)
Creatinine, Ser: 1 mg/dL (ref 0.61–1.24)
GFR calc Af Amer: >60 mL/min (ref >60)
GFR calc non Af Amer: >60 mL/min (ref >60)
Glucose, Bld: 315 mg/dL — ABNORMAL HIGH (ref 70–99)
Potassium: 4.5 mmol/L (ref 3.5–5.1)
Sodium: 134 mmol/L — ABNORMAL LOW (ref 135–145)
TOTAL PROTEIN: 6.7 g/dL (ref 6.5–8.1)

## 2018-03-13 LAB — CBC
HCT: 40.4 % (ref 39.0–52.0)
Hemoglobin: 13.6 g/dL (ref 13.0–17.0)
MCH: 32.4 pg (ref 26.0–34.0)
MCHC: 33.7 g/dL (ref 30.0–36.0)
MCV: 96.2 fL (ref 80.0–100.0)
PLATELETS: 95 10*3/uL — AB (ref 150–400)
RBC: 4.2 MIL/uL — ABNORMAL LOW (ref 4.22–5.81)
RDW: 13.5 % (ref 11.5–15.5)
WBC: 5.1 10*3/uL (ref 4.0–10.5)
nRBC: 0 % (ref 0.0–0.2)

## 2018-03-13 LAB — RAPID URINE DRUG SCREEN, HOSP PERFORMED
Amphetamines: NOT DETECTED
Barbiturates: NOT DETECTED
Benzodiazepines: NOT DETECTED
COCAINE: NOT DETECTED
Opiates: NOT DETECTED
Tetrahydrocannabinol: NOT DETECTED

## 2018-03-13 LAB — ETHANOL

## 2018-03-13 NOTE — ED Provider Notes (Signed)
Patient seen/examined in the Emergency Department in conjunction with Midlevel Provider  [facility], seeking placement Exam : awake/alert, ambulating around the ED, no distress noted Plan: case management and social work are both working on placement of this patient    Zadie RhineWickline, Fahmida Jurich, MD 03/13/18 2325

## 2018-03-13 NOTE — ED Triage Notes (Signed)
The pt reports that he fell and the ambulance came and picked him up and brought him here . The police brought him here for ?? uinable to determine why hes here

## 2018-03-13 NOTE — Progress Notes (Addendum)
11:41 PM Per APS, pt's legal guardian did not sign pt into [facility] today. APS looking into who signed pt into [facility] and out of [facility] today. Per supervisor at [place], pt can return to [facility] for a safe place to stay tonight and APS will follow up. APS aware that pt is wanting to leave and walk to [facility]. APS aware that hospital cannot keep pt over night. After hours APS can be reached at [phone].  Legal Guardian is [facility] [phone].  11:32 PM CSW received phone call back from on call guardianship supervisor. Per supervisor, pt supposed to still be at [facility]. Pt's legal guardian off today, so pt would not have been transferred to a new facility today. On call supervisor, reaching out to legal guardian and will call CSW back. CSW called [facility]. Dawn, Therapist, sports, at [place] stated pt was discharged from facility today and they have instructions not to accept him back.   11:16 PM Pt is from [facility]. CSW and RNCM met with pt at bedside. Pt does not want to return to [facility]. Pt discharged from [facility] today to [facility]. Pt has a legal guardian from [place]. CSW left voicemail for pt's legal guardian at [phone]. CSW called after hours APS line.  CSW spoke with [facility]. [facility] willing to take pt back. Pt refusing to return to [facility].   Wendelyn Breslow, [facility]  [phone]

## 2018-03-13 NOTE — ED Triage Notes (Signed)
contant talking rambling no idea//

## 2018-03-13 NOTE — ED Notes (Addendum)
Please note: [place] [facility] [facility] [address]
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
## 2018-03-14 ENCOUNTER — Emergency Department (HOSPITAL_COMMUNITY)
Admission: EM | Admit: 2018-03-14 | Discharge: 2018-03-14 | Disposition: A | Discharge status: Home / Self Care | Payer: Medicaid Other | Source: Home / Self Care | Attending: Emergency Medicine | Admitting: Emergency Medicine

## 2018-03-14 ENCOUNTER — Other Ambulatory Visit: Payer: Self-pay

## 2018-03-14 ENCOUNTER — Encounter (HOSPITAL_COMMUNITY): Payer: Self-pay | Admitting: Emergency Medicine

## 2018-03-14 DIAGNOSIS — Z599 Problem related to housing and economic circumstances, unspecified: Secondary | ICD-10-CM

## 2018-03-14 DIAGNOSIS — M25531 Pain in right wrist: Secondary | ICD-10-CM

## 2018-03-14 NOTE — ED Triage Notes (Addendum)
See EDP note from previous visit.  Pt went to [facility] and they informed him that he no longer had a bed available at their facility.  Pt refused to leave.  [employer] called due to pt trespassing.  Pt reports he felt off balance when stepping up and stepping down from curb.  Denies any other symptoms.  Ambulatory to triage without difficulty.  Pt initially stated that he had R wrist pain from being handcuffed but then denied pain when I asked him to rate his pain.

## 2018-03-14 NOTE — ED Notes (Signed)
Pt was advised not to leave and he stated he wanted to go [facility], He was told several times by Child psychotherapistocial worker and Case Management that he was discharged from there and needed to go to [facility]. Pt wanted to leave and took his stuff to the lobby. They have [facility] from [facility] and made them aware of the situation.

## 2018-03-14 NOTE — ED Notes (Signed)
Social worker and Sports coachCase Manager has been working with patient and he is very Theme park manageradement about wanting to return to [facility]. Pt stated he was going weather they liked it or not. He said he would wait in corridor until his social worker figured something out. Both Child psychotherapistsocial worker here and case management has been talking with him to convince him other wise.
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
## 2018-03-14 NOTE — ED Notes (Signed)
Pt was seen by the PA at triage and discharged in police custody.

## 2018-03-14 NOTE — Discharge Instructions (Addendum)
1. Medications: usual home medications 2. Treatment: rest, drink plenty of fluids 3. Follow Up: Please followup with your primary doctor in 2-3 days for discussion of your diagnoses and further evaluation after today's visit; if you do not have a primary care doctor use the resource guide provided to find one; Please return to the ER for new or worsening symptoms  

## 2018-03-21 ENCOUNTER — Non-Acute Institutional Stay (SKILLED_NURSING_FACILITY): Payer: Medicaid Other | Admitting: Internal Medicine

## 2018-03-21 ENCOUNTER — Encounter: Payer: Self-pay | Admitting: Internal Medicine

## 2018-03-21 DIAGNOSIS — R110 Nausea: Secondary | ICD-10-CM

## 2018-03-21 DIAGNOSIS — R42 Dizziness and giddiness: Secondary | ICD-10-CM

## 2018-03-21 DIAGNOSIS — G939 Disorder of brain, unspecified: Secondary | ICD-10-CM

## 2018-03-21 DIAGNOSIS — G9389 Other specified disorders of brain: Secondary | ICD-10-CM

## 2018-03-21 DIAGNOSIS — F0789 Other personality and behavioral disorders due to known physiological condition: Secondary | ICD-10-CM

## 2018-03-21 NOTE — Patient Instructions (Signed)
See assessment and plan under each diagnosis in the problem list and acutely for this visit 
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
## 2018-03-21 NOTE — Assessment & Plan Note (Addendum)
At this time a one-to-one sitter has been assigned to him because of the repeated behavioral issues  His legal guardian will be informed of these behaviors I have written an order for staff to document and report any such episodes Such behavior could jeopardize not only his welfare and health but also the other residents and staff

## 2018-04-20 ENCOUNTER — Encounter: Payer: Self-pay | Admitting: Adult Health

## 2018-04-20 ENCOUNTER — Non-Acute Institutional Stay (SKILLED_NURSING_FACILITY): Payer: Medicaid Other | Admitting: Adult Health

## 2018-04-20 DIAGNOSIS — Z20828 Contact with and (suspected) exposure to other viral communicable diseases: Secondary | ICD-10-CM

## 2018-04-20 DIAGNOSIS — F39 Unspecified mood [affective] disorder: Secondary | ICD-10-CM | POA: Diagnosis not present

## 2018-04-20 DIAGNOSIS — F322 Major depressive disorder, single episode, severe without psychotic features: Secondary | ICD-10-CM
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
## 2018-05-09 ENCOUNTER — Encounter: Payer: Self-pay | Admitting: Internal Medicine

## 2018-05-09 ENCOUNTER — Non-Acute Institutional Stay (SKILLED_NURSING_FACILITY): Payer: Medicaid Other | Admitting: Internal Medicine

## 2018-05-09 DIAGNOSIS — G9389 Other specified disorders of brain: Secondary | ICD-10-CM

## 2018-05-09 DIAGNOSIS — F0789 Other personality and behavioral disorders due to known physiological condition: Secondary | ICD-10-CM | POA: Diagnosis not present

## 2018-05-09 DIAGNOSIS — S069X9D Unspecified intracranial injury with loss of consciousness of unspecified duration, subsequent encounter: Secondary | ICD-10-CM | POA: Diagnosis not present

## 2018-05-09 DIAGNOSIS — D696 Thrombocytopenia, unspecified: Secondary | ICD-10-CM

## 2018-05-09 DIAGNOSIS — G939 Disorder of brain, unspecified: Secondary | ICD-10-CM

## 2018-05-09 MED ORDER — DIVALPROEX SODIUM 250 MG PO DR TAB: 250.0000 mg | DELAYED_RELEASE_TABLET | Freq: Every day | ORAL | 0 refills | Status: AC

## 2018-05-09 MED ORDER — RANITIDINE HCL 150 MG PO TABS: 150.0000 mg | ORAL_TABLET | Freq: Two times a day (BID) | ORAL | 0 refills | Status: AC | PRN

## 2018-05-09 MED ORDER — ESCITALOPRAM OXALATE 10 MG PO TABS: 20.0000 mg | ORAL_TABLET | Freq: Every day | ORAL | 0 refills | Status: AC

## 2018-05-09 MED ORDER — OLANZAPINE 5 MG PO TABS: 5.0000 mg | ORAL_TABLET | Freq: Every day | ORAL | 0 refills | Status: AC
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
## 2018-07-04 ENCOUNTER — Encounter (HOSPITAL_COMMUNITY): Payer: Self-pay

## 2018-07-04 ENCOUNTER — Emergency Department (HOSPITAL_COMMUNITY)
Admission: EM | Admit: 2018-07-04 | Discharge: 2018-07-04 | Disposition: A | Discharge status: Home / Self Care | Payer: Medicaid Other | Attending: Emergency Medicine | Admitting: Emergency Medicine

## 2018-07-04 ENCOUNTER — Emergency Department (HOSPITAL_COMMUNITY): Payer: Medicaid Other

## 2018-07-04 ENCOUNTER — Other Ambulatory Visit: Payer: Self-pay

## 2018-07-04 DIAGNOSIS — Z7722 Contact with and (suspected) exposure to environmental tobacco smoke (acute) (chronic): Secondary | ICD-10-CM | POA: Diagnosis not present

## 2018-07-04 DIAGNOSIS — Y929 Unspecified place or not applicable: Secondary | ICD-10-CM | POA: Diagnosis not present

## 2018-07-04 DIAGNOSIS — W19XXXA Unspecified fall, initial encounter: Secondary | ICD-10-CM | POA: Diagnosis not present

## 2018-07-04 DIAGNOSIS — N183 Chronic kidney disease, stage 3 (moderate): Secondary | ICD-10-CM | POA: Diagnosis not present

## 2018-07-04 DIAGNOSIS — Z23 Encounter for immunization: Secondary | ICD-10-CM | POA: Diagnosis not present

## 2018-07-04 DIAGNOSIS — Y9301 Activity, walking, marching and hiking: Secondary | ICD-10-CM | POA: Insufficient documentation

## 2018-07-04 DIAGNOSIS — Y999 Unspecified external cause status: Secondary | ICD-10-CM | POA: Diagnosis not present

## 2018-07-04 DIAGNOSIS — S59912A Unspecified injury of left forearm, initial encounter: Secondary | ICD-10-CM | POA: Diagnosis present

## 2018-07-04 DIAGNOSIS — S42202A Unspecified fracture of upper end of left humerus, initial encounter for closed fracture: Secondary | ICD-10-CM

## 2018-07-04 DIAGNOSIS — E1122 Type 2 diabetes mellitus with diabetic chronic kidney disease: Secondary | ICD-10-CM | POA: Diagnosis not present

## 2018-07-04 DIAGNOSIS — I129 Hypertensive chronic kidney disease with stage 1 through stage 4 chronic kidney disease, or unspecified chronic kidney disease: Secondary | ICD-10-CM | POA: Insufficient documentation

## 2018-07-04 LAB — BASIC METABOLIC PANEL
Anion gap: 11 (ref 5–15)
BUN: <5 mg/dL — ABNORMAL LOW (ref 6–20)
CO2: 22 mmol/L (ref 22–32)
Calcium: 8.6 mg/dL — ABNORMAL LOW (ref 8.9–10.3)
Chloride: 101 mmol/L (ref 98–111)
Creatinine, Ser: 0.69 mg/dL (ref 0.61–1.24)
GFR calc Af Amer: >60 mL/min (ref >60)
GFR calc non Af Amer: >60 mL/min (ref >60)
Glucose, Bld: 180 mg/dL — ABNORMAL HIGH (ref 70–99)
Potassium: 4 mmol/L (ref 3.5–5.1)
Sodium: 134 mmol/L — ABNORMAL LOW (ref 135–145)

## 2018-07-04 LAB — CBC
HCT: 42.5 % (ref 39.0–52.0)
Hemoglobin: 14.4 g/dL (ref 13.0–17.0)
MCH: 31.7 pg (ref 26.0–34.0)
MCHC: 33.9 g/dL (ref 30.0–36.0)
MCV: 93.6 fL (ref 80.0–100.0)
Platelets: 138 10*3/uL — ABNORMAL LOW (ref 150–400)
RBC: 4.54 MIL/uL (ref 4.22–5.81)
RDW: 14.1 % (ref 11.5–15.5)
WBC: 9 10*3/uL (ref 4.0–10.5)
nRBC: 0 % (ref 0.0–0.2)

## 2018-07-04 LAB — ETHANOL: Alcohol, Ethyl (B): 73 mg/dL — ABNORMAL HIGH (ref <10)

## 2018-07-04 MED ORDER — HYDROCODONE-ACETAMINOPHEN 5-325 MG PO TABS
1.0000 | ORAL_TABLET | ORAL | Status: AC
  Administered 2018-07-04: 1 via ORAL
  Filled 2018-07-04: qty 1

## 2018-07-04 MED ORDER — TETANUS-DIPHTH-ACELL PERTUSSIS 5-2.5-18.5 LF-MCG/0.5 IM SUSP
0.5000 mL | Freq: Once | INTRAMUSCULAR | Status: AC
  Administered 2018-07-04: 16:00:00 0.5 mL via INTRAMUSCULAR
  Filled 2018-07-04: qty 0.5

## 2018-07-04 NOTE — Progress Notes (Addendum)
CSW received a call from pt's legal guardian Annell Greening: [phone] and her personal cell is ph: [phone].  Rodena Medin at [employer] is the Dietitian at DSS and he can make all medical decisions (invasive procedures, medication needs etc).   Pt has an apartment and pt can be sent back to his apartment via taxi if and when medically cleared as pt is capable to transport by taxi by himself, per pt's LG [facility].  In addition, Miss Archie Patten stated that if Mr. Nedra Hai is not available by phone Miss Archie Patten can be contacted for assistancve with decisions regarding pt's care.  Per pt's LG, pt's current address is:  [address] [place], [place] [address]  CSW will continue to follow for D/C needs.  Dorothe Pea. Ayona Yniguez, LCSW, LCAS, CSI Transitions of Care Clinical Social Worker Care Coordination Department Ph: [phone]

## 2018-07-04 NOTE — Discharge Instructions (Signed)
Take over-the-counter medications as needed for pain, apply ice to help with swelling, make sure to keep the sling on, follow-up with an orthopedic doctor to discuss physical therapy and further treatment for your shoulder fracture

## 2018-07-04 NOTE — ED Notes (Signed)
Patient has a legal guardian  [place]: [phone]
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
## 2018-07-04 NOTE — ED Notes (Signed)
Pt wants to sit in a "more comfortable chair" in the room.  Pt made ware that the chairs in the room are the only chair available.  Pt ambulated to the BR with steady gait.  This nurse offered socks with grips, pt declined.

## 2018-07-04 NOTE — ED Triage Notes (Addendum)
Pt BIBA from home. Pt was seen by home health aide who advised him to be checked out at hospital (possibly SW?). Pt has skin tear on left elbow. Pt c/o left shoulder pain. Pt states slipped and fell in bathroom yesterday. Denies LOC.
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
## 2018-07-04 NOTE — ED Notes (Addendum)
Pt's legal Guardian [facility] [phone] states she is not able to pick up the pt.  She reports pt lives alone and should have money to ride a cab.  She was made aware that pt will need to f/u with an orthopeadic d/t L humerus fx.  She verbalized understanding

## 2018-07-04 NOTE — ED Notes (Addendum)
Pt was able to sit up with assist.  Sling immobilizer applied by [employer] ortho tech.  Pt wants to sit on the side of the stretcher.  He's made aware not to stand up without assist.  Pt verbalizes understanding.

## 2018-07-19 IMAGING — DX DG ELBOW 2V*R*
2 series · 2 of 2 positions shown · non-contrast
Comparison: 05/09/2017

CLINICAL DATA: Right elbow pain and bruising.  Fell 2 weeks ago.

EXAM:
RIGHT ELBOW - 2 VIEW

[elbow ap]
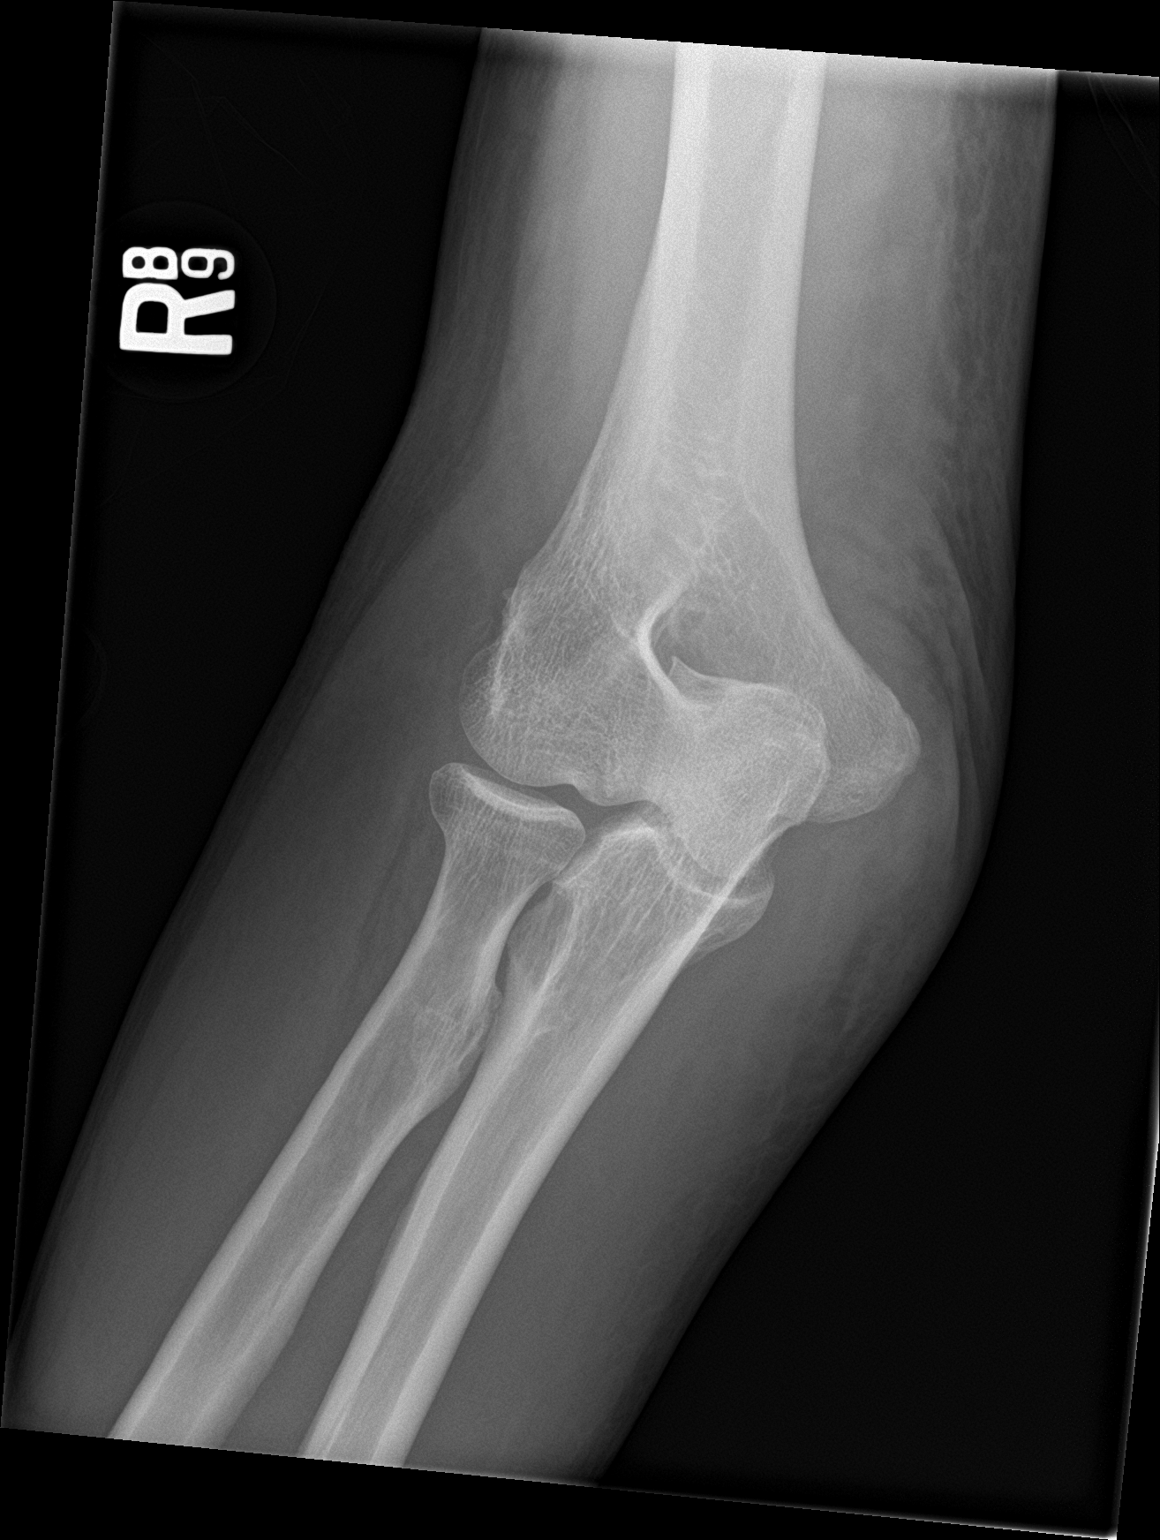

[elbow lat]
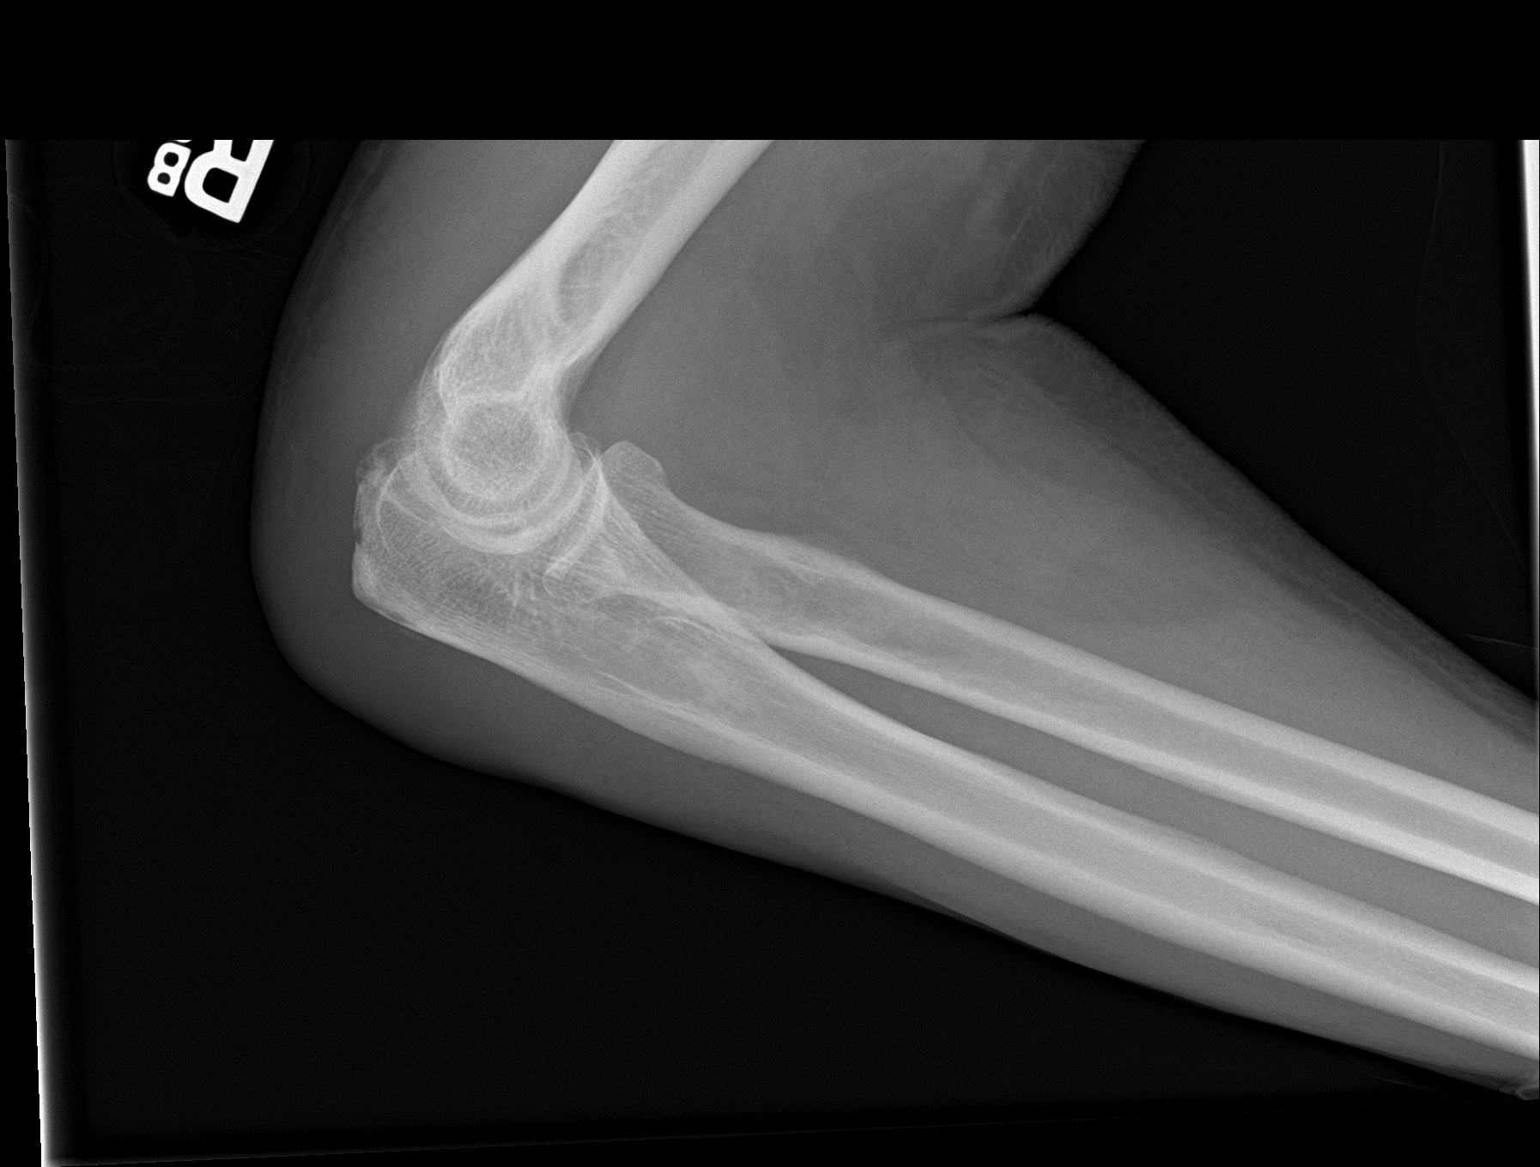

[2 of 2 positions shown; findings below may reference images not displayed]

FINDINGS: There is a persistent small elbow joint effusion. No fracture or
dislocation is identified. Soft tissue swelling is noted about the
posterior aspect of the elbow.
IMPRESSION: Persistent small elbow joint effusion and soft tissue swelling
without fracture identified.

## 2018-10-22 ENCOUNTER — Emergency Department (HOSPITAL_COMMUNITY)
Admission: EM | Admit: 2018-10-22 | Discharge: 2018-10-31 | Disposition: A | Discharge status: Home / Self Care | Payer: Medicaid Other | Attending: Emergency Medicine | Admitting: Emergency Medicine

## 2018-10-22 ENCOUNTER — Encounter (HOSPITAL_COMMUNITY): Payer: Self-pay | Admitting: Emergency Medicine

## 2018-10-22 ENCOUNTER — Other Ambulatory Visit: Payer: Self-pay

## 2018-10-22 DIAGNOSIS — Z7722 Contact with and (suspected) exposure to environmental tobacco smoke (acute) (chronic): Secondary | ICD-10-CM | POA: Diagnosis not present

## 2018-10-22 DIAGNOSIS — F32A Depression, unspecified: Secondary | ICD-10-CM

## 2018-10-22 DIAGNOSIS — I129 Hypertensive chronic kidney disease with stage 1 through stage 4 chronic kidney disease, or unspecified chronic kidney disease: Secondary | ICD-10-CM | POA: Diagnosis not present

## 2018-10-22 DIAGNOSIS — Z20828 Contact with and (suspected) exposure to other viral communicable diseases: Secondary | ICD-10-CM | POA: Insufficient documentation

## 2018-10-22 DIAGNOSIS — F332 Major depressive disorder, recurrent severe without psychotic features: Secondary | ICD-10-CM | POA: Insufficient documentation

## 2018-10-22 DIAGNOSIS — Z79899 Other long term (current) drug therapy: Secondary | ICD-10-CM | POA: Diagnosis not present

## 2018-10-22 DIAGNOSIS — E119 Type 2 diabetes mellitus without complications: Secondary | ICD-10-CM | POA: Diagnosis not present

## 2018-10-22 DIAGNOSIS — R410 Disorientation, unspecified: Secondary | ICD-10-CM | POA: Diagnosis not present

## 2018-10-22 DIAGNOSIS — F1014 Alcohol abuse with alcohol-induced mood disorder: Secondary | ICD-10-CM | POA: Diagnosis not present

## 2018-10-22 DIAGNOSIS — N183 Chronic kidney disease, stage 3 (moderate): Secondary | ICD-10-CM | POA: Diagnosis not present

## 2018-10-22 DIAGNOSIS — F329 Major depressive disorder, single episode, unspecified: Secondary | ICD-10-CM

## 2018-10-22 LAB — CBC
HCT: 45.3 % (ref 39.0–52.0)
Hemoglobin: 15.7 g/dL (ref 13.0–17.0)
MCH: 32 pg (ref 26.0–34.0)
MCHC: 34.7 g/dL (ref 30.0–36.0)
MCV: 92.4 fL (ref 80.0–100.0)
Platelets: 109 10*3/uL — ABNORMAL LOW (ref 150–400)
RBC: 4.9 MIL/uL (ref 4.22–5.81)
RDW: 13.6 % (ref 11.5–15.5)
WBC: 6.2 10*3/uL (ref 4.0–10.5)
nRBC: 0 % (ref 0.0–0.2)

## 2018-10-22 LAB — COMPREHENSIVE METABOLIC PANEL
ALT: 41 U/L (ref 0–44)
AST: 87 U/L — ABNORMAL HIGH (ref 15–41)
Albumin: 3.6 g/dL (ref 3.5–5.0)
Alkaline Phosphatase: 202 U/L — ABNORMAL HIGH (ref 38–126)
Anion gap: 10 (ref 5–15)
BUN: 5 mg/dL — ABNORMAL LOW (ref 6–20)
CO2: 22 mmol/L (ref 22–32)
Calcium: 8.9 mg/dL (ref 8.9–10.3)
Chloride: 101 mmol/L (ref 98–111)
Creatinine, Ser: 0.87 mg/dL (ref 0.61–1.24)
GFR calc Af Amer: >60 mL/min (ref >60)
GFR calc non Af Amer: >60 mL/min (ref >60)
Glucose, Bld: 355 mg/dL — ABNORMAL HIGH (ref 70–99)
Potassium: 4.5 mmol/L (ref 3.5–5.1)
Sodium: 133 mmol/L — ABNORMAL LOW (ref 135–145)
Total Bilirubin: 1.5 mg/dL — ABNORMAL HIGH (ref 0.3–1.2)
Total Protein: 7.4 g/dL (ref 6.5–8.1)

## 2018-10-22 LAB — ETHANOL: Alcohol, Ethyl (B): 122 mg/dL — ABNORMAL HIGH (ref <10)

## 2018-10-22 LAB — ACETAMINOPHEN LEVEL: Acetaminophen (Tylenol), Serum: <10 ug/mL — ABNORMAL LOW (ref 10–30)

## 2018-10-22 LAB — SALICYLATE LEVEL: Salicylate Lvl: <7 mg/dL (ref 2.8–30.0)
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
## 2018-10-22 NOTE — ED Triage Notes (Signed)
Patient arrived with 2 [employer] officers , IVC by his therapist due to substance abuse and behavior problems , denies suicidal or homicidal ideation at triage , no hallucinations or delusions , refused to wear maroon paper scrubs at triage .
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
## 2018-10-23 DIAGNOSIS — F329 Major depressive disorder, single episode, unspecified: Secondary | ICD-10-CM

## 2018-10-23 LAB — CBG MONITORING, ED: Glucose-Capillary: 204 mg/dL — ABNORMAL HIGH (ref 70–99)

## 2018-10-23 LAB — RAPID URINE DRUG SCREEN, HOSP PERFORMED
Amphetamines: NOT DETECTED
Barbiturates: NOT DETECTED
Benzodiazepines: NOT DETECTED
Cocaine: NOT DETECTED
Opiates: NOT DETECTED
Tetrahydrocannabinol: NOT DETECTED

## 2018-10-23 LAB — SARS CORONAVIRUS 2 (TAT 6-24 HRS): SARS Coronavirus 2: NEGATIVE

## 2018-10-23 MED ORDER — LISINOPRIL 2.5 MG PO TABS
5.0000 mg | ORAL_TABLET | Freq: Every day | ORAL | Status: DC
  Administered 2018-10-23 – 2018-10-31 (×9): 5 mg via ORAL
  Filled 2018-10-23 (×3): qty 2
  Filled 2018-10-23: qty 1
  Filled 2018-10-23 (×5): qty 2

## 2018-10-23 MED ORDER — OLANZAPINE 5 MG PO TABS
5.0000 mg | ORAL_TABLET | Freq: Every day | ORAL | Status: DC
  Administered 2018-10-23 – 2018-10-30 (×9): 5 mg via ORAL
  Filled 2018-10-23 (×10): qty 1

## 2018-10-23 MED ORDER — ATORVASTATIN CALCIUM 10 MG PO TABS
10.0000 mg | ORAL_TABLET | Freq: Every day | ORAL | Status: DC
  Administered 2018-10-23 – 2018-10-31 (×9): 10 mg via ORAL
  Filled 2018-10-23 (×9): qty 1

## 2018-10-23 MED ORDER — TAMSULOSIN HCL 0.4 MG PO CAPS
0.4000 mg | ORAL_CAPSULE | Freq: Every day | ORAL | Status: DC
  Administered 2018-10-23 – 2018-10-31 (×9): 0.4 mg via ORAL
  Filled 2018-10-23 (×9): qty 1

## 2018-10-23 MED ORDER — DIVALPROEX SODIUM ER 250 MG PO TB24
250.0000 mg | ORAL_TABLET | Freq: Every day | ORAL | Status: DC
  Administered 2018-10-23 – 2018-10-31 (×9): 250 mg via ORAL
  Filled 2018-10-23 (×9): qty 1

## 2018-10-23 MED ORDER — ESCITALOPRAM OXALATE 10 MG PO TABS
20.0000 mg | ORAL_TABLET | Freq: Every day | ORAL | Status: DC
  Administered 2018-10-23 – 2018-10-31 (×9): 20 mg via ORAL
  Filled 2018-10-23 (×9): qty 2

## 2018-10-23 NOTE — ED Notes (Signed)
Breakfast Ordered 
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
## 2018-10-23 NOTE — ED Notes (Signed)
Pt eating breakfast tray 
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
## 2018-10-23 NOTE — ED Notes (Signed)
Pharmacy tech attempting to reach [facility] for updated med list, pt states he does not know what he takes.

## 2018-10-23 NOTE — ED Notes (Signed)
Urinal provided to patient for urine sample. Pt continues to request he be released.

## 2018-10-23 NOTE — ED Notes (Signed)
Pt ambulated to restroom. Pt denies being incontinent. Pt has not had any incontinent episodes while in the ED.

## 2018-10-23 NOTE — ED Notes (Signed)
Pt had BM in his pants while walking to the bathroom. NT and RN assisted pt with showering and cleaning off.

## 2018-10-23 NOTE — ED Notes (Signed)
Belongings placed in locker 2.  

## 2018-10-23 NOTE — ED Notes (Signed)
Pt resting on stretcher, pt took meds with no issues.

## 2018-10-23 NOTE — BHH Counselor (Signed)
Clinician called [name], therapist at [phone] (number on IVC) to gather additional information. Clinician spoke to [facility] on-call with [facility] noted [name] he will have Pearl return call after 0800. Clinician provided social work phone number to [place] [phone]).     [name], [name], [facility], [facility] Triage Specialist [phone]

## 2018-10-23 NOTE — BHH Counselor (Signed)
Clinician waiting for IVC paperwork to be faxed again to [facility] [phone]).    Vertell Novak, [place], [facility], [facility] Triage Specialist [phone]

## 2018-10-23 NOTE — ED Provider Notes (Signed)
1515: Pt boarding in ER awaiting TTS evaluation. TTS has evaluated patient and recommends placement, SW looking for placement. Initially normotensive last BP slightly elevated, but pt has not been taking his meds. Home meds ordered. CBG on initial CMP 355 w/o signs of DKA. Will recheck CBG. H/o diabetes diet controlled, not on any meds. COVID test pending for placement. Patient has been ambulatory in ER, tolerating PO, no distress on assessment.    Kinnie Feil, PA-C 10/23/18 [place], [place], DO 10/23/18 [phone]

## 2018-10-23 NOTE — ED Notes (Signed)
Pt talking with TTS  

## 2018-10-23 NOTE — ED Notes (Signed)
Pt changed into scrubs and wanded by security  

## 2018-10-24 NOTE — BH Assessment (Signed)
[place] Assessment Progress Note     Patient was seen for re-assessment.  He presented with a very flat and blunted affect and was somewhat guarded.  His presentation was somewhat bizarre.  He states that he feels better today and states that he is no longer suicidal/homicidal or psychotic. Patient has been incontinent in the ED.  Patient states that he lives alone in an apartment.  However, he does not appear to be someone that should be living independently.  Patient appears to be very depressed and would benefit with inpatient placement and possibly transfer to an AFL or assisted living facility.  TTS continues to meet inpatient criteria.

## 2018-10-24 NOTE — NC FL2 (Signed)
[place] LEVEL OF CARE SCREENING TOOL     IDENTIFICATION  Patient Name: [name] Birthdate: September 03, 1965 Sex: male Admission Date (Current Location): 10/22/2018  [place] and [place] Number:  Kathleen Argue [phone] [place] and Address:  [facility] [place], [place] [address] [place], [place] [address]      Provider Number: [id number]  Attending Physician Name and Address:  Default, Provider, MD  Relative Name and Phone Number:  [name] Faulds    Current Level of Care: Hospital Recommended Level of Care: [place] Prior Approval Number: [id number]  Date Approved/Denied: 05/19/17 PASRR Number: [id number]  Discharge Plan: SNF    Current Diagnoses: Patient Active Problem List   Diagnosis Date Noted  . Thrombocytopenia ([place]) 03/09/2018  . Neurocognitive deficits 03/07/2018  . 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 Depression, major, single episode, severe ([place]) 03/02/2018  . 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 Insomnia 09/28/2017  . Edema of hand 08/12/2017  . 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 Agitation   . Depression 06/06/2017  . Altered mental status   . Focal hemorrhagic contusion of cerebrum ([place])   . Subdural hematoma ([place])   . Benign essential HTN   . Stage 3 chronic kidney disease ([place])   . ETOH abuse   . Hypokalemia   . Acute blood loss anemia   . Anemia of chronic disease   . TBI (traumatic brain injury) ([place]) 05/13/2017  . Tachycardia   . Alcohol withdrawal ([place]) 04/03/2017  . DM2 (diabetes mellitus, type 2) ([place]) 04/03/2017  . Frontal lobe contusion ([place]) 01/23/2017  . Encephalopathy acute   . Recurrent falls   . Subarachnoid hemorrhage following injury with brief loss of consciousness but without open intracranial wound ([place]) 07/17/2016  . Traumatic subarachnoid  hemorrhage ([place]) 01/15/2015  . Alcohol intoxication ([place]) 01/15/2015  . SAH (subarachnoid hemorrhage) ([place]) 01/15/2015    Orientation RESPIRATION BLADDER Height & Weight     Self, Time, Situation, Place  Normal Incontinent Weight:   Height:     BEHAVIORAL SYMPTOMS/MOOD NEUROLOGICAL BOWEL NUTRITION STATUS    (TBI) Incontinent    AMBULATORY STATUS COMMUNICATION OF NEEDS Skin   Supervision Verbally Normal                       Personal Care Assistance Level of Assistance  Bathing, Dressing, Feeding Bathing Assistance: Limited assistance Feeding assistance: Independent Dressing Assistance: Limited assistance     Functional Limitations Info  Sight, Speech, Hearing Sight Info: Adequate Hearing Info: Adequate Speech Info: Adequate    SPECIAL CARE FACTORS FREQUENCY  PT (By licensed PT), OT (By licensed OT)     PT Frequency: 3x weekly OT Frequency: 3x weekly            Contractures Contractures Info: Not present    Additional Factors Info  Code Status, Allergies, Psychotropic Code Status Info: Full Allergies Info: No Known Allergies Psychotropic Info: OLANZapine (ZYPREXA) tablet 5 mg,divalproex (DEPAKOTE ER) 24 hr tablet 250 mg,OLANZapine (ZYPREXA) tablet 5 mg         Current Medications (10/24/2018):  This is the current hospital active medication list Current Facility-Administered Medications  Medication Dose Route Frequency Provider Last Rate Last Dose  . atorvastatin (LIPITOR) tablet 10 mg  10 mg Oral Daily Kinnie Feil, PA-C   10 mg at 10/24/18 1208  . divalproex (DEPAKOTE ER) 24 hr tablet 250  mg  250 mg Oral Daily Liberty HandyGibbons, Claudia J, PA-C   250 mg at 10/24/18 1208  . escitalopram (LEXAPRO) tablet 20 mg  20 mg Oral Daily Liberty HandyGibbons, Claudia J, PA-C   20 mg at 10/24/18 1207  . lisinopril (ZESTRIL) tablet 5 mg  5 mg Oral Daily Liberty HandyGibbons, Claudia J, PA-C   5 mg at 10/24/18 1208  . OLANZapine (ZYPREXA) tablet 5 mg  5 mg Oral QHS Upstill, Shari, PA-C   5 mg at  10/24/18 0215  . tamsulosin (FLOMAX) capsule 0.4 mg  0.4 mg Oral Daily Liberty HandyGibbons, Claudia J, PA-C   0.4 mg at 10/24/18 1208   Current Outpatient Medications  Medication Sig Dispense Refill  . atorvastatin (LIPITOR) 10 MG tablet Take 10 mg by mouth daily.    . divalproex (DEPAKOTE ER) 250 MG 24 hr tablet Take 250 mg by mouth daily.    Marland Kitchen. escitalopram (LEXAPRO) 20 MG tablet Take 20 mg by mouth daily.    Marland Kitchen. lisinopril (ZESTRIL) 5 MG tablet Take 5 mg by mouth daily.    Marland Kitchen. OLANZapine (ZYPREXA) 5 MG tablet Take 1 tablet (5 mg total) by mouth at bedtime. 14 tablet 0  . tamsulosin (FLOMAX) 0.4 MG CAPS capsule Take 0.4 mg by mouth daily.    . divalproex (DEPAKOTE) 250 MG DR tablet Take 1 tablet (250 mg total) by mouth daily. (Patient not taking: Reported on 10/23/2018) 15 tablet 0  . escitalopram (LEXAPRO) 10 MG tablet Take 2 tablets (20 mg total) by mouth daily. Take two 10 mg tabs to equal 20 mg total once a day (Patient not taking: Reported on 10/23/2018) 30 tablet 0  . ranitidine (ZANTAC) 150 MG tablet Take 1 tablet (150 mg total) by mouth 2 (two) times daily as needed for heartburn. (Patient not taking: Reported on 10/23/2018) 15 tablet 0     Discharge Medications: Please see discharge summary for a list of discharge medications.  Relevant Imaging Results:  Relevant Lab Results:   Additional Information SS#: [id number]  Denina Rieger Sherryle LisM Amye Grego, LCSW

## 2018-10-24 NOTE — Progress Notes (Signed)
Pt meets inpatient criteria per Mordecai Maes, NP. Referral information has been sent to the following hospitals for review:  [facility]  [facility]  [facility]  [facility]     Disposition will continue to assist with inpatient placement needs.   Audree Camel, LCSW, [place] Disposition [facility]/TTS [phone]

## 2018-10-24 NOTE — ED Notes (Signed)
Pt ambulated to bathroom and back to room w/o difficulty.  

## 2018-10-24 NOTE — ED Notes (Signed)
Pt noted to be sleeping. Pt was offered to shower earlier - declined. Will re-attempt.

## 2018-10-25 ENCOUNTER — Emergency Department (HOSPITAL_COMMUNITY): Payer: Medicaid Other

## 2018-10-25 LAB — CBC WITH DIFFERENTIAL/PLATELET
Abs Immature Granulocytes: 0.05 10*3/uL (ref 0.00–0.07)
Basophils Absolute: 0 10*3/uL (ref 0.0–0.1)
Basophils Relative: 1 %
Eosinophils Absolute: 0.1 10*3/uL (ref 0.0–0.5)
Eosinophils Relative: 1 %
HCT: 45.6 % (ref 39.0–52.0)
Hemoglobin: 15.5 g/dL (ref 13.0–17.0)
Immature Granulocytes: 1 %
Lymphocytes Relative: 29 %
Lymphs Abs: 1.5 10*3/uL (ref 0.7–4.0)
MCH: 32.4 pg (ref 26.0–34.0)
MCHC: 34 g/dL (ref 30.0–36.0)
MCV: 95.4 fL (ref 80.0–100.0)
Monocytes Absolute: 0.4 10*3/uL (ref 0.1–1.0)
Monocytes Relative: 8 %
Neutro Abs: 3.2 10*3/uL (ref 1.7–7.7)
Neutrophils Relative %: 60 %
Platelets: 92 10*3/uL — ABNORMAL LOW (ref 150–400)
RBC: 4.78 MIL/uL (ref 4.22–5.81)
RDW: 13.5 % (ref 11.5–15.5)
WBC: 5.3 10*3/uL (ref 4.0–10.5)
nRBC: 0 % (ref 0.0–0.2)

## 2018-10-25 LAB — BASIC METABOLIC PANEL
Anion gap: 9 (ref 5–15)
BUN: 6 mg/dL (ref 6–20)
CO2: 24 mmol/L (ref 22–32)
Calcium: 8.8 mg/dL — ABNORMAL LOW (ref 8.9–10.3)
Chloride: 99 mmol/L (ref 98–111)
Creatinine, Ser: 0.95 mg/dL (ref 0.61–1.24)
GFR calc Af Amer: >60 mL/min (ref >60)
GFR calc non Af Amer: >60 mL/min (ref >60)
Glucose, Bld: 267 mg/dL — ABNORMAL HIGH (ref 70–99)
Potassium: 3.7 mmol/L (ref 3.5–5.1)
Sodium: 132 mmol/L — ABNORMAL LOW (ref 135–145)

## 2018-10-25 LAB — CBG MONITORING, ED: Glucose-Capillary: 220 mg/dL — ABNORMAL HIGH (ref 70–99)

## 2018-10-25 MED ORDER — MUPIROCIN CALCIUM 2 % EX CREA
TOPICAL_CREAM | Freq: Two times a day (BID) | CUTANEOUS | Status: DC
  Administered 2018-10-25 – 2018-10-27 (×5): via TOPICAL
  Administered 2018-10-27: 1 via TOPICAL
  Administered 2018-10-28 – 2018-10-29 (×2): via TOPICAL
  Administered 2018-10-30: 1 via TOPICAL
  Administered 2018-10-31: 10:00:00 via TOPICAL
  Filled 2018-10-25 (×3): qty 15

## 2018-10-25 MED ORDER — MUPIROCIN CALCIUM 2 % EX CREA: TOPICAL_CREAM | Freq: Every day | CUTANEOUS | Status: DC

## 2018-10-25 NOTE — Evaluation (Signed)
Physical Therapy Evaluation Patient Details Name: Christopher ReichertManuel Shaffer MRN: [id number] DOB: Sep 12, 1965 Today's Date: 10/25/2018   History of Present Illness  Pt is a 53 y/o male who was involuntarily committed secondary to increased aggressive behavior and substance abuse. Pt with R great toe wound as well. PMH includes DM, HTN, and RN reports hx of TBI.   Clinical Impression  Pt admitted secondary to problem above with deficits below. Pt with cognitive deficits at baseline as RN reports TBI at baseline. Pt requiring min to min guard A for mobility this session. Increased unsteadiness noted with dynamic gait tasks. Pt currently lives alone, and feel he needs increased assist at d/c. Will continue to follow acutely to maximize functional mobility independence and safety.      Follow Up Recommendations Supervision/Assistance - 24 hour;Other (comment)(SNF vs. inpatient psych)    Equipment Recommendations  None recommended by PT    Recommendations for Other Services       Precautions / Restrictions Precautions Precautions: Fall;Other (comment) Precaution Comments: safety sitter Restrictions Weight Bearing Restrictions: No      Mobility  Bed Mobility Overal bed mobility: Needs Assistance Bed Mobility: Supine to Sit     Supine to sit: Supervision     General bed mobility comments: Supervision for safety. Increased time and effort required to come to sitting.   Transfers Overall transfer level: Needs assistance Equipment used: None Transfers: Sit to/from Stand Sit to Stand: Min assist         General transfer comment: Min A for lift assist and steadying.   Ambulation/Gait Ambulation/Gait assistance: Min assist;Min guard Gait Distance (Feet): 100 Feet Assistive device: None Gait Pattern/deviations: Step-through pattern;Decreased stride length;Wide base of support Gait velocity: Decreased   General Gait Details: Slow, waddle type gait. Min guard to min A for steadying. Pt  with unsteadiness especially when performing horizontal and vertical head turns.   Stairs            Wheelchair Mobility    Modified Rankin (Stroke Patients Only)       Balance Overall balance assessment: Needs assistance Sitting-balance support: No upper extremity supported;Feet supported Sitting balance-Leahy Scale: Good     Standing balance support: During functional activity;No upper extremity supported Standing balance-Leahy Scale: Fair                               Pertinent Vitals/Pain Pain Assessment: No/denies pain    Home Living Family/patient expects to be discharged to:: Private residence Living Arrangements: Alone   Type of Home: Apartment Home Access: Level entry              Prior Function Level of Independence: Independent               Hand Dominance        Extremity/Trunk Assessment   Upper Extremity Assessment Upper Extremity Assessment: Overall WFL for tasks assessed    Lower Extremity Assessment Lower Extremity Assessment: Generalized weakness    Cervical / Trunk Assessment Cervical / Trunk Assessment: Normal  Communication   Communication: No difficulties  Cognition Arousal/Alertness: Awake/alert Behavior During Therapy: WFL for tasks assessed/performed Overall Cognitive Status: History of cognitive impairments - at baseline                                 General Comments: Per RN hx of TBI. Noted decreased safety awareness during  session.       General Comments      Exercises     Assessment/Plan    PT Assessment Patient needs continued PT services  PT Problem List Decreased strength;Decreased balance;Decreased mobility;Decreased knowledge of use of DME;Decreased knowledge of precautions;Decreased safety awareness;Decreased cognition       PT Treatment Interventions Gait training;Stair training;Therapeutic activities;Functional mobility training;Therapeutic exercise;Balance  training;Patient/family education;Cognitive remediation    PT Goals (Current goals can be found in the Care Plan section)  Acute Rehab PT Goals Patient Stated Goal: none stated PT Goal Formulation: With patient Time For Goal Achievement: 11/08/18 Potential to Achieve Goals: Good    Frequency Min 2X/week   Barriers to discharge Decreased caregiver support      Co-evaluation               AM-PAC PT "6 Clicks" Mobility  Outcome Measure Help needed turning from your back to your side while in a flat bed without using bedrails?: None Help needed moving from lying on your back to sitting on the side of a flat bed without using bedrails?: A Little Help needed moving to and from a bed to a chair (including a wheelchair)?: A Little Help needed standing up from a chair using your arms (e.g., wheelchair or bedside chair)?: A Little Help needed to walk in hospital room?: A Little Help needed climbing 3-5 steps with a railing? : A Lot 6 Click Score: 18    End of Session Equipment Utilized During Treatment: Gait belt Activity Tolerance: Patient tolerated treatment well Patient left: in bed;with call bell/phone within reach;with nursing/sitter in room(sitting EOB ) Nurse Communication: Mobility status PT Visit Diagnosis: Unsteadiness on feet (R26.81);Muscle weakness (generalized) (M62.81)    Time: 0017-4944 PT Time Calculation (min) (ACUTE ONLY): 10 min   Charges:   PT Evaluation $PT Eval Moderate Complexity: [place], PT, DPT  Acute Rehabilitation Services  Pager: [phone] Office: [phone]   Rudean Hitt 10/25/2018, 1:16 PM

## 2018-10-25 NOTE — ED Provider Notes (Addendum)
Patient to the ED under IVC for behavioral disturbances and substance abuse.  No overnight events.  RN informed me that after his shower she noted a wound to the right great toe.  The patient who has a history of Korsakoff psychosis and neurocognitive deficits cannot tell me how long it has been present.  He tells me does not hurt and he denies any weakness to the area.  Has a history of diabetes.  No fevers. Physical Exam  BP (!) 157/86 (BP Location: Right Arm)   Pulse 64   Temp 97.6 F (36.4 C) (Oral)   Resp 18   SpO2 97%   Physical Exam Vitals signs and nursing note reviewed.  Constitutional:      General: He is not in acute distress.    Appearance: He is well-developed.     Comments: Resting comfortably in bed  HENT:     Head: Normocephalic and atraumatic.  Eyes:     General:        Right eye: No discharge.        Left eye: No discharge.     Conjunctiva/sclera: Conjunctivae normal.  Neck:     Vascular: No JVD.     Trachea: No tracheal deviation.  Cardiovascular:     Rate and Rhythm: Normal rate.     Pulses: Normal pulses.     Comments: 2+ DP/PT pulses bilaterally, no lower extremity edema.  Homans sign absent bilaterally. Pulmonary:     Effort: Pulmonary effort is normal.  Abdominal:     General: There is no distension.  Musculoskeletal:     Comments: See below images.  Patient with 2.5 cm ulceration to the medial aspect of the right great toe.  Beefy red granulation tissue noted with some central sloughing.  No active drainage.  Nontender.  No surrounding erythema.  Small area of skin breakdown less than 1 cm along the medial aspect of the right second toe with no signs of secondary skin infection  Skin:    General: Skin is warm and dry.     Findings: No erythema.  Neurological:     Mental Status: He is alert.     Comments: Unable to provide history but full speech is fluent with no aphasia or deficits noted.  Cranial nerves appear grossly intact.  Moves extremities  spontaneously without difficulty.  Sensation intact to light touch of bilateral lower extremities.  Psychiatric:        Behavior: Behavior normal.       ED Course/Procedures     Procedures Labs Reviewed  COMPREHENSIVE METABOLIC PANEL - Abnormal; Notable for the following components:      Result Value   Sodium 133 (*)    Glucose, Bld 355 (*)    BUN 5 (*)    AST 87 (*)    Alkaline Phosphatase 202 (*)    Total Bilirubin 1.5 (*)    All other components within normal limits  ETHANOL - Abnormal; Notable for the following components:   Alcohol, Ethyl (B) 122 (*)    All other components within normal limits  ACETAMINOPHEN LEVEL - Abnormal; Notable for the following components:   Acetaminophen (Tylenol), Serum <10 (*)    All other components within normal limits  CBC - Abnormal; Notable for the following components:   Platelets 109 (*)    All other components within normal limits  CBC WITH DIFFERENTIAL/PLATELET - Abnormal; Notable for the following components:   Platelets 92 (*)    All other components  within normal limits  BASIC METABOLIC PANEL - Abnormal; Notable for the following components:   Sodium 132 (*)    Glucose, Bld 267 (*)    Calcium 8.8 (*)    All other components within normal limits  CBG MONITORING, ED - Abnormal; Notable for the following components:   Glucose-Capillary 204 (*)    All other components within normal limits  CBG MONITORING, ED - Abnormal; Notable for the following components:   Glucose-Capillary 220 (*)    All other components within normal limits  SARS CORONAVIRUS 2  SALICYLATE LEVEL  RAPID URINE DRUG SCREEN, HOSP PERFORMED   Dg Foot Complete Right  Result Date: 10/25/2018 CLINICAL DATA:  Diabetic ulcer of the great toe. EXAM: RIGHT FOOT COMPLETE - 3+ VIEW COMPARISON:  None. FINDINGS: There is no evidence of fracture or dislocation. There is no evidence of arthropathy or other focal bone abnormality. Ulceration of the soft tissues of the medial  first digit. No underlying osseous erosion. IMPRESSION: 1. Ulceration of the soft tissues of the medial first digit. 2. No radiographic evidence of osteomyelitis. Electronically Signed   By: Ted Mcalpineobrinka  Dimitrova M.D.   On: 10/25/2018 10:34    MDM  Patient here under IVC awaiting placement.  Called in to evaluate patient due to foot wound.  No sign of secondary skin infection surrounding the wound.  He is neurovascularly intact and resting comfortably on assessment.  Repeat blood work performed today shows hyperglycemia but no evidence of DKA.  Imaging shows no evidence of osteomyelitis or subcutaneous gas.  Doubt necrotizing fasciitis, osteomyelitis, or septic arthritis given work-up today.  Wound care was consulted, see patient's chart for further recommendations from their standpoint.  They recommend Bactroban ointment and bandage and monitoring and otherwise have signed off.  Sterile dressing was applied.      Jeanie SewerFawze, Sihaam Chrobak A, PA-C 10/25/18 1245    Tegeler, Canary Brimhristopher J, MD 10/25/18 [phone]

## 2018-10-25 NOTE — B[facility] Counselor (Signed)
10/25/2018:  Pt is a 53 year old male who presented to the ED due to self-injury and bizarre behavior (per IVC).  Pt was reassessed.  He was lying on his bed.  When asked why he was at the hospital stated, ''I don't know... someone thought I did something wrong.''  Pt denied suicidal homicidal, homicidal ideation, and hallucination.  Pt appeared to have no insight into current situation.  Pt's affect was blunted.  Mood was empty.  Recommend continued inpatient.  From previous reassessment. Patient was seen for re-assessment.  He presented with a very flat and blunted affect and was somewhat guarded.  His presentation was somewhat bizarre.  He states that he feels better today and states that he is no longer suicidal/homicidal or psychotic. Patient has been incontinent in the ED.  Patient states that he lives alone in an apartment.  However, he does not appear to be someone that should be living independently.  Patient appears to be very depressed and would benefit with inpatient placement and possibly transfer to an AFL or assisted living facility.  TTS continues to meet inpatient criteria.

## 2018-10-25 NOTE — ED Notes (Signed)
I didn't have and Success getting patient blood, on right arm, The Nurse was informed.

## 2018-10-25 NOTE — ED Notes (Signed)
Pt noted w/open area to inner aspect of right great toe to nail. Very small amount of bleeding noted. States "I think my shoes did it. It's been there a while". Pt unable to advise of specific amount of time. Assessed by Amada Kingfisher, PA.

## 2018-10-25 NOTE — ED Notes (Signed)
Returned from x-ray. Sitter w/pt.

## 2018-10-25 NOTE — Consult Note (Signed)
[facility] Nurse wound consult note Patient receiving care in [facility] ED49. Reason for Consult: Great toe diabetic wound.  Consult completed remotely by review of record, imaging results, and photo. Wound type: neuropathic Wound bed: red with a small area of slough Periwound: intact Dressing procedure/placement/frequency:  Apply Bactroban to wound on great toe. Cover with a bandaid. Monitor the wound area(s) for worsening of condition such as: Signs/symptoms of infection,  Increase in size,  Development of or worsening of odor, Development of pain, or increased pain at the affected locations.  Notify the medical team if any of these develop.  Thank you for the consult. [facility] nurse will not follow at this time.  Please re-consult the [facility] team if needed.  Val Riles, RN, MSN, CWOCN, CNS-BC, pager [phone]

## 2018-10-25 NOTE — ED Notes (Signed)
Pt briefly showered after much encouragement.

## 2018-10-25 NOTE — ED Notes (Signed)
Patient was given Catheryn Bacon, and Cup of Sprite.

## 2018-10-25 NOTE — ED Notes (Signed)
Diet was ordered for Lunch. 

## 2018-10-26 MED ORDER — LORAZEPAM 1 MG PO TABS: 1.0000 mg | ORAL_TABLET | Freq: Three times a day (TID) | ORAL | Status: DC | PRN

## 2018-10-26 MED ORDER — HYDROXYZINE HCL 50 MG PO TABS
50.0000 mg | ORAL_TABLET | Freq: Four times a day (QID) | ORAL | Status: DC | PRN
  Administered 2018-10-28 – 2018-10-30 (×3): 50 mg via ORAL
  Filled 2018-10-26 (×3): qty 1

## 2018-10-26 NOTE — ED Notes (Signed)
Patient was given Coffee and A Sprite. Regular Diet was ordered for Lunch.

## 2018-10-26 NOTE — ED Notes (Signed)
Patient came out of room demanding his belongings and states he is leaving tomorrow because he has been here long enough; pt was explained about IVC but patient was argumentative with RN; pt redirected back to room-Monique,RN

## 2018-10-26 NOTE — BH Specialist Note (Signed)
Reassessment- Pt continues to meet inpatient criteria. Pt reported he slept and ate well. Pt reports he feels alright. Pt was calm with flat affect. Pt denies SI/HI/AH/VH at this time. Per notes Pt was IVC'd due to aggressive and odd behavior. Per notes and ED staff pt was agitated earlier this AM.

## 2018-10-26 NOTE — ED Notes (Signed)
Dinner tray ordered.

## 2018-10-26 NOTE — ED Notes (Signed)
Pt cooperative at this time but displaying some mild anxious/agitated behaviors ie continually asking for drinks/snacks (pt offered coffee or water, denied sprite and cookies at this time as breakfast is on the way). Discussed with EDP for PRN ativan order

## 2018-10-27 DIAGNOSIS — R410 Disorientation, unspecified: Secondary | ICD-10-CM

## 2018-10-27 DIAGNOSIS — F1014 Alcohol abuse with alcohol-induced mood disorder: Secondary | ICD-10-CM

## 2018-10-27 NOTE — Progress Notes (Signed)
CSW reviewed chart and sees pt was psychiatrically cleared by psychiatry with the note (see below):  "Disposition: There is vidence of imminent risk to self or others at present.  Patient does not meet criteria for psychiatric inpatient admission, however will benefit from assisted living."  CSW reviewed chart further and notes that pt's legal guardian with DSS, Jewel Monroe was requesting an FL-2 for SNF placement at "[facility]", per disposition note from 8/3 at 11:08am (see below):  She notes that patient is incontinent of urine, has extremely poor hygiene and needs to be placed in a SNF.  CSW explained that patient had been seen by our NP today and she is recommending inpatient treatment but there is a recognition that patient would be difficult to place if he is incontinent. She is working on a placement at [facility] and requested that an FL-2 be completed and faxed to her attention to [phone].  CSW (this Probation officer) who had Thermon Leyland phone # [phone] called the number and left a HIPPA-compliant VM requesting a call back that D/C was imminent.  Of note: Since psychiatry has psychiatrically cleared the pt and psychiatry is saying ALF placement might be safe for the pt and per notes, the pt's LG is saying SNF placement might be safe for the pt, then CSW leadership and the EDP should be consulted as to whether pt should be d/c's home with his wife as the pt is psychiatrically cleared and also as ALF placement and SNF placement is unlikely from the ED, as pt has a HX of substance abuse and behavior problems.  CSW will continue to follow for D/C needs.  Alphonse Guild. Zayana Salvador, LCSW, LCAS, CSI Transitions of Care Clinical Social Worker Care Coordination Department Ph: [phone]

## 2018-10-27 NOTE — ED Notes (Signed)
Bp elevated at this time.  Message sent to provider.  Patient calm at this time with no complaints voiced.

## 2018-10-27 NOTE — ED Notes (Signed)
BREAKFAST ORDERED  

## 2018-10-27 NOTE — ED Notes (Signed)
Dinner ordered 

## 2018-10-27 NOTE — Progress Notes (Addendum)
CSW updated EDP who confirmed pt is medically cleared and that pt's wound care should be followed up on in the community post-D/C.  EDP is making no decision on discharge at this time, and has no opinion on placement.  CSW will leave hnadoff for 1st shift ED CSW to consult CSW leadership in regards to suggestions on discharging pt.  CSW called [facility] who reviewed chart and confirms again pt is psychiatrically cleared.  CSW attempted contact with Advanced Care Supervisor.  PLAN: Consult Advanced Care Supervisor on Sat 8/8 and update EDP who will make a decision on D/C.  CSW will continue to follow for D/C needs.  Alphonse Guild. Jodee Wagenaar, LCSW, LCAS, CSI Transitions of Care Clinical Social Worker Care Coordination Department Ph: [phone]

## 2018-10-27 NOTE — ED Notes (Signed)
Patient was given Coffee and [name]. Regular Diet was ordered for Lunch.

## 2018-10-27 NOTE — ED Provider Notes (Signed)
Patient evaluated on daily rounds.  Patient is sleeping comfortably on my exam.  Nursing staff reports no issues overnight. Wound care is ordered every shift for toe wound.  DSS will seek placement, but patient has been cleared from inpatient psychiatric treatment, per most recent note from [facility], [place] with psychiatry.   Frederica Kuster, PA-C 10/27/18 1624    Malvin Johns, MD 10/27/18 1650

## 2018-10-27 NOTE — ED Notes (Signed)
Per Dr Tamera Punt to watch patients bp since currently asymptomatic.
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
## 2018-10-28 NOTE — ED Notes (Signed)
Breakfast ordered 

## 2018-10-28 NOTE — ED Notes (Signed)
Patient denies pain and is resting comfortably.  

## 2018-10-29 NOTE — ED Notes (Signed)
Patient denies pain and is resting comfortably.  

## 2018-10-29 NOTE — Progress Notes (Signed)
CSW following for discharge support. CSW notes patient's legal guardian is pursuing SNF placement. CSW notes FL-2 has been sent to legal guardian. CSW has not been able to reach legal guardian at this point over the weekend.  Lamonte Richer, LCSW, [place] Worker II [phone]

## 2018-10-29 NOTE — Progress Notes (Signed)
CSW provided update to patient on patient care. CSW noted guardian was seeking group home placement and not SNF. CSW noted patient reported his want to return to his apartment to have his phone to call friends and family and to have access to his dental implants. CSW left v/m with legal guardian. CSW reaching out to after-hours DSS to discuss sending him to his apartment as he has a key and has been cleared for discharge.  Lamonte Richer, LCSW, [place] Worker II [phone]

## 2018-10-29 NOTE — ED Notes (Signed)
Pt requested Spanish speaking [employer] officer call his wife to update her of visitor policy. Social work to update pt himself on plan of care

## 2018-10-30 NOTE — ED Notes (Signed)
Pt has made a phone call. And keeps asking for soda. pt Refuses water

## 2018-10-30 NOTE — Progress Notes (Signed)
Physical Therapy Treatment Patient Details Name: Christopher ReichertManuel Shaffer MRN: [id number] DOB: 10-24-1965 Today's Date: 10/30/2018    History of Present Illness Pt is a 53 y/o male who was involuntarily committed secondary to increased aggressive behavior and substance abuse. Pt with R great toe wound as well. PMH includes DM, HTN, and RN reports hx of TBI.     PT Comments    Pt progressing towards goals. Practiced dynamic gait tasks during gait this session and pt required min to min guard A. Pt continues to present with decreased safety awareness and per notes is unable to care for himself at home. Pt lives alone and feel he is at increased risk for falls. Recommending SNF at d/c. Will continue to follow acutely to maximize functional mobility independence and safety.     Follow Up Recommendations  SNF;Supervision/Assistance - 24 hour     Equipment Recommendations  None recommended by PT    Recommendations for Other Services       Precautions / Restrictions Precautions Precautions: Fall;Other (comment) Precaution Comments: safety sitter Restrictions Weight Bearing Restrictions: No    Mobility  Bed Mobility Overal bed mobility: Needs Assistance Bed Mobility: Supine to Sit     Supine to sit: Supervision     General bed mobility comments: Supervision for safety.   Transfers Overall transfer level: Needs assistance Equipment used: None Transfers: Sit to/from Stand Sit to Stand: Min guard         General transfer comment: Min guard for steadying assist.   Ambulation/Gait Ambulation/Gait assistance: Min assist;Min guard Gait Distance (Feet): 125 Feet Assistive device: None Gait Pattern/deviations: Step-through pattern;Decreased stride length;Wide base of support;Drifts right/left Gait velocity: Decreased   General Gait Details: Slow, waddle type gait. Practiced dynamic gait tasks during gait for balance challenge. Required min to min guard A for steadying when  performing dynamic gait challenges. Performed multiple horizontal and vertical head turns and practiced stepping over obstacles in hall.    Stairs             Wheelchair Mobility    Modified Rankin (Stroke Patients Only)       Balance Overall balance assessment: Needs assistance Sitting-balance support: No upper extremity supported;Feet supported Sitting balance-Leahy Scale: Good     Standing balance support: During functional activity;No upper extremity supported Standing balance-Leahy Scale: Fair                 High Level Balance Comments: Performed static standing with eyes open and eyes closed. Only able to maintain standing with narrow BOS and eyes closed for a few seconds (performed X 3)            Cognition Arousal/Alertness: Awake/alert Behavior During Therapy: WFL for tasks assessed/performed Overall Cognitive Status: History of cognitive impairments - at baseline                                        Exercises General Exercises - Upper Extremity Shoulder Flexion: AROM;Both;10 reps;Seated Elbow Flexion: AROM;Both;10 reps;Seated General Exercises - Lower Extremity Long Arc Quad: AROM;Both;10 reps;Seated Hip Flexion/Marching: AROM;Both;10 reps;Seated Toe Raises: AROM;Both;10 reps;Seated Heel Raises: AROM;Both;10 reps;Seated    General Comments        Pertinent Vitals/Pain Pain Assessment: No/denies pain    Home Living                      Prior Function  PT Goals (current goals can now be found in the care plan section) Acute Rehab PT Goals Patient Stated Goal: none stated PT Goal Formulation: With patient Time For Goal Achievement: 11/08/18 Potential to Achieve Goals: Good Progress towards PT goals: Progressing toward goals    Frequency    Min 2X/week      PT Plan Discharge plan needs to be updated    Co-evaluation              AM-PAC PT "6 Clicks" Mobility   Outcome  Measure  Help needed turning from your back to your side while in a flat bed without using bedrails?: None Help needed moving from lying on your back to sitting on the side of a flat bed without using bedrails?: A Little Help needed moving to and from a bed to a chair (including a wheelchair)?: A Little Help needed standing up from a chair using your arms (e.g., wheelchair or bedside chair)?: A Little Help needed to walk in hospital room?: A Little Help needed climbing 3-5 steps with a railing? : A Lot 6 Click Score: 18    End of Session Equipment Utilized During Treatment: Gait belt Activity Tolerance: Patient tolerated treatment well Patient left: in bed;with call bell/phone within reach;with nursing/sitter in room Nurse Communication: Mobility status PT Visit Diagnosis: Unsteadiness on feet (R26.81);Muscle weakness (generalized) (M62.81)     Time: 9485-4627 PT Time Calculation (min) (ACUTE ONLY): 10 min  Charges:  $Gait Training: 8-22 mins                     Leighton Ruff, PT, DPT  Acute Rehabilitation Services  Pager: [phone] Office: [phone]    Rudean Hitt 10/30/2018, 1:05 PM

## 2018-10-30 NOTE — ED Triage Notes (Signed)
Pt requested to wear facial mask while out of his room . Pt requested a mask . Pt's mask located in his room . Pt became aggressive and jerked mask out of this writers hand . Pt asking for drinks of soda multiple times. Pt instructed snack time  Was at [place].  Pt became belligerent when he was instructed snack time was at 10AM.  Pt returned to his room .

## 2018-10-30 NOTE — ED Notes (Signed)
Pt is asking for another soda. Pt was just given a coke. Water was given at bedside.

## 2018-10-30 NOTE — ED Triage Notes (Signed)
PT refusing to take AM meds . Pt demanding multiple cans of soda.

## 2018-10-30 NOTE — ED Triage Notes (Signed)
Pt demanding more soda . Pt has been instructed only one snack time between meals . Pt may have water till meal arives.

## 2018-10-30 NOTE — ED Notes (Signed)
Lunch tray ordered 

## 2018-10-30 NOTE — ED Notes (Signed)
Breakfast Ordered 

## 2018-10-30 NOTE — Progress Notes (Signed)
CSW spoke with patient's legal guardian [place] [facility] [place] regarding this patient. Jewel reports that the patient has had several recent falls, is incontinent of urine, and lacks the ability to care for himself independently. Jewel is requesting the patient go to a SNF for rehab and that she will assist patient with placement post SNF. Jewel states she desires the patient to go to [place] in [place]. CSW sent the FL-2 to [facility] via secure email. Further PT evaluation is pending for this patient as he has been psychiatrically cleared.  Madilyn Fireman, MSW, LCSW-A Clinical Social Worker Transitions of [place] [phone]

## 2018-10-31 NOTE — TOC Transition Note (Signed)
Transition of Care [facility]) - CM/SW Discharge Note   Patient Details  Name: [name] MRN: [id number] Date of Birth: Feb 23, 1966  Transition of [facility] CM/SW Contact:  Archie Endo, LCSW Phone Number: 10/31/2018, 2:16 PM   Clinical Narrative:    CSW notified patient's legal guardian [name] at [place] of patient's acceptance into facility. Guardian is agreeable to plan and has been in contact with the facility.  Patient will discharge to [facility] via [place] [facility] [place] today. Patient will go to room 131. The number to call for report is [phone]. Please fax patient's AVS to [phone] prior to discharge.   Final next level of care: [place] Barriers to Discharge: No Barriers Identified   Patient Goals and CMS Choice        Discharge Placement              Patient chooses bed at: [facility] Patient to be transferred to facility by: [place] Name of family member notified: Legal [name] [phone] Patient and family notified of of transfer: 10/31/18  Discharge Plan and Services      Social Determinants of Health (SDOH) Interventions     Readmission Risk Interventions No flowsheet data found.

## 2018-10-31 NOTE — ED Notes (Addendum)
PTAR has arrived to transport pt to [facility]. ALL belongings - 1 labeled belongings bag and 1 valuables envelope - PTAR - Pt aware. Pt unable to sign for d/c paperwork.

## 2018-10-31 NOTE — ED Notes (Signed)
Encouraging pt to shower x 3 - pt refusing - stating "I don't need a shower".

## 2018-10-31 NOTE — ED Notes (Signed)
AVS faxed to [facility] - fax [phone] as directed. Continuing to wait for PTAR.

## 2018-10-31 NOTE — ED Notes (Signed)
Diet was ordered for Lunch. 

## 2018-10-31 NOTE — Progress Notes (Addendum)
12pm: CSW received confirmation that this patient has been accepted at [facility]. CSW will contact facility for discharge planning.   8am: CSW attempted to reach via call and text [place] [place] [facility] to obtain an update on this patient without success.  CSW attempted to reach [place] at [facility] without success, left voicemail requesting a return call.  Madilyn Fireman, MSW, LCSW-A Clinical Social Worker [facility] [place] [facility] [phone]

## 2018-10-31 NOTE — ED Notes (Signed)
IVC papers rescinded - copy faxed to [employer] - copy sent to Medical Records - original placed in folder for [employer].

## 2018-10-31 NOTE — ED Notes (Addendum)
Patient was given Diet Coke and [employer].

## 2018-10-31 NOTE — ED Notes (Signed)
Breakfast ordered 

## 2019-12-14 IMAGING — DX RIGHT FOOT COMPLETE - 3+ VIEW
3 series · 3 of 3 positions shown · non-contrast
Comparison: None.

CLINICAL DATA: Diabetic ulcer of the great toe.

EXAM:
RIGHT FOOT COMPLETE - 3+ VIEW

[x foot ap right]
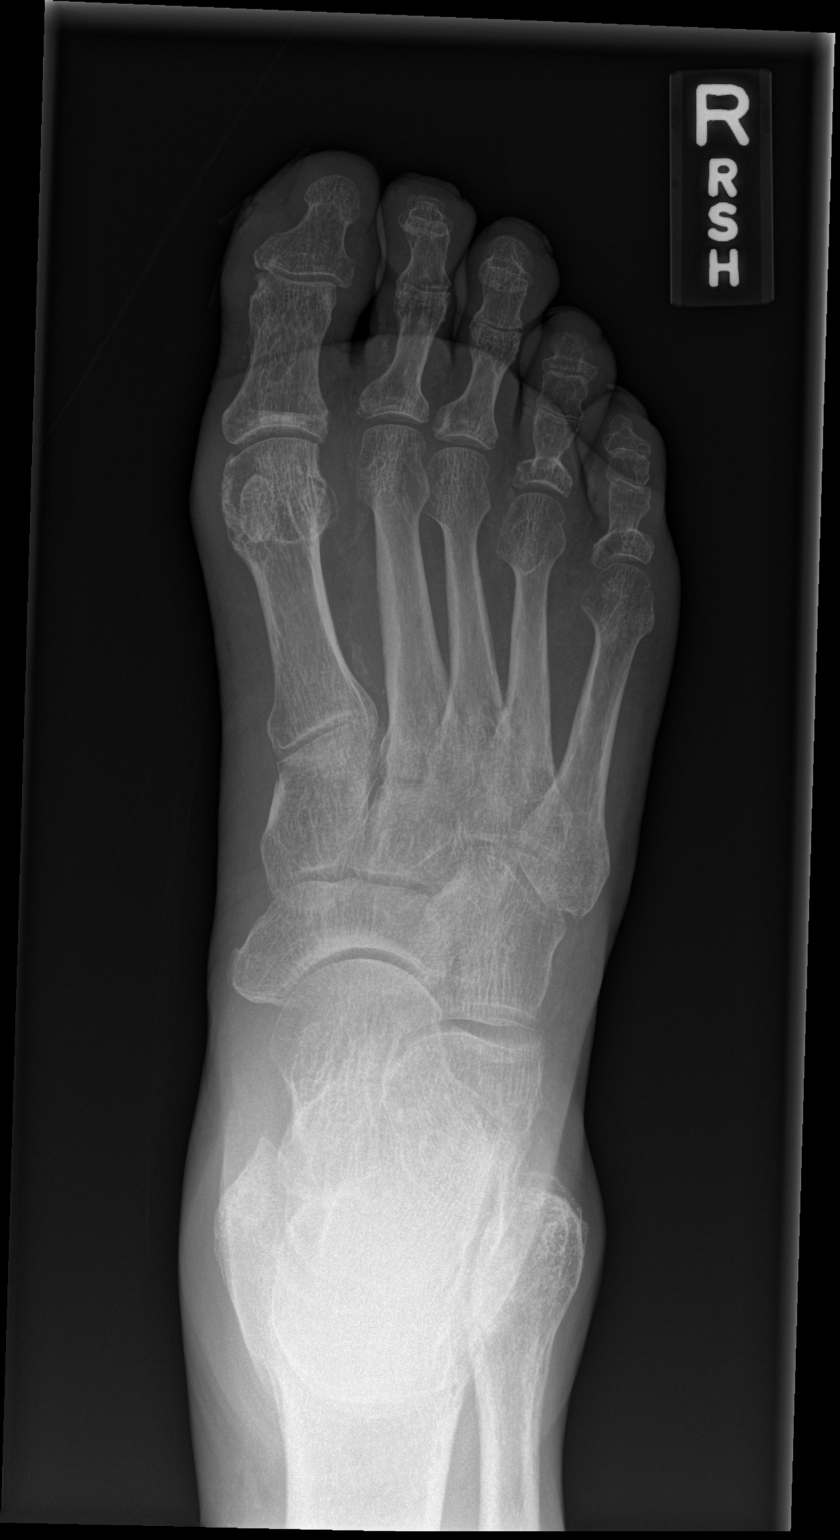

[x foot obl right]
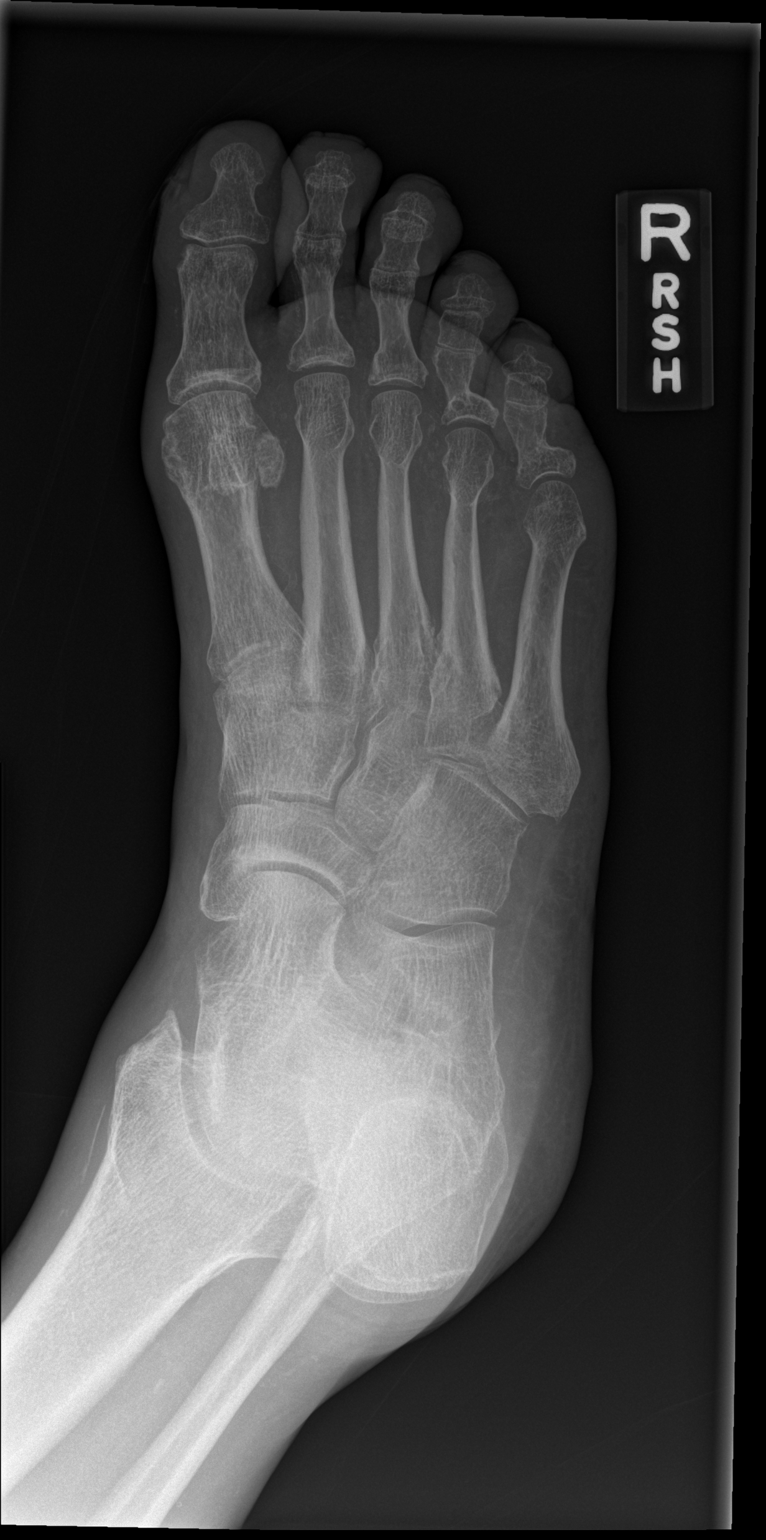

[x foot lat right]
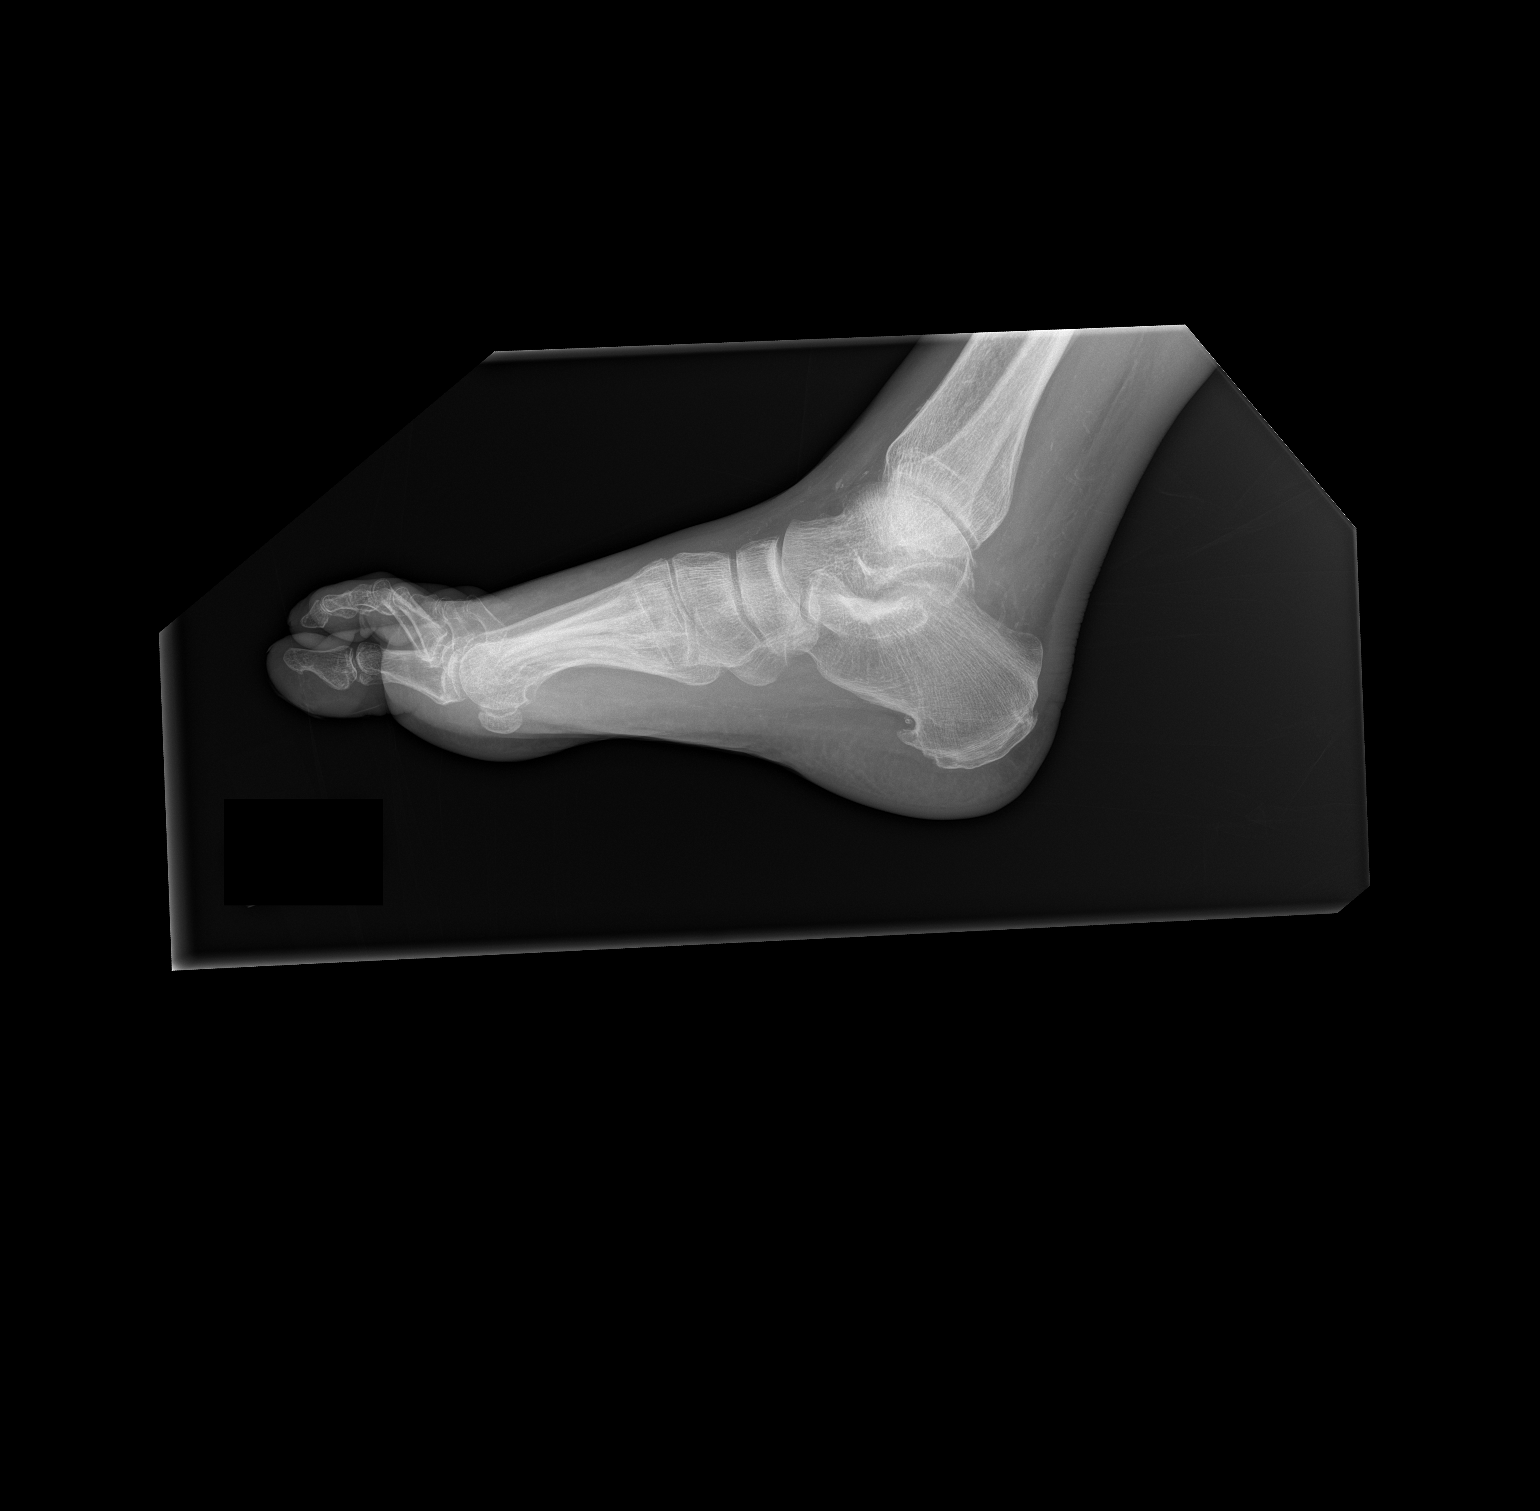

[3 of 3 positions shown; findings below may reference images not displayed]

FINDINGS: There is no evidence of fracture or dislocation. There is no
evidence of arthropathy or other focal bone abnormality. Ulceration
of the soft tissues of the medial first digit. No underlying osseous
erosion.
IMPRESSION: 1. Ulceration of the soft tissues of the medial first digit.
2. No radiographic evidence of osteomyelitis.
